# Patient Record
Sex: Female | Born: 1960
Health system: Southern US, Community
[De-identification: ages and names within clinical notes are randomized; demographics above are authoritative.]

## PROBLEM LIST (undated history)

## (undated) ENCOUNTER — Emergency Department (HOSPITAL_COMMUNITY): Admission: EM | Disposition: A | Discharge status: Home / Self Care | Payer: Self-pay

## (undated) DIAGNOSIS — K219 Gastro-esophageal reflux disease without esophagitis: Secondary | ICD-10-CM

## (undated) DIAGNOSIS — R7303 Prediabetes: Secondary | ICD-10-CM

## (undated) DIAGNOSIS — R87619 Unspecified abnormal cytological findings in specimens from cervix uteri: Secondary | ICD-10-CM

## (undated) DIAGNOSIS — R079 Chest pain, unspecified: Secondary | ICD-10-CM

## (undated) DIAGNOSIS — G5603 Carpal tunnel syndrome, bilateral upper limbs: Secondary | ICD-10-CM

## (undated) DIAGNOSIS — E785 Hyperlipidemia, unspecified: Secondary | ICD-10-CM

## (undated) DIAGNOSIS — D649 Anemia, unspecified: Secondary | ICD-10-CM

## (undated) DIAGNOSIS — E8881 Metabolic syndrome: Secondary | ICD-10-CM

## (undated) DIAGNOSIS — I10 Essential (primary) hypertension: Secondary | ICD-10-CM

## (undated) DIAGNOSIS — I509 Heart failure, unspecified: Secondary | ICD-10-CM

## (undated) DIAGNOSIS — E119 Type 2 diabetes mellitus without complications: Secondary | ICD-10-CM

## (undated) DIAGNOSIS — Z973 Presence of spectacles and contact lenses: Secondary | ICD-10-CM

## (undated) DIAGNOSIS — R55 Syncope and collapse: Secondary | ICD-10-CM

## (undated) DIAGNOSIS — Z6841 Body Mass Index (BMI) 40.0 and over, adult: Secondary | ICD-10-CM

## (undated) DIAGNOSIS — G43709 Chronic migraine without aura, not intractable, without status migrainosus: Secondary | ICD-10-CM

## (undated) DIAGNOSIS — M4316 Spondylolisthesis, lumbar region: Secondary | ICD-10-CM

## (undated) DIAGNOSIS — D573 Sickle-cell trait: Secondary | ICD-10-CM

## (undated) DIAGNOSIS — M1990 Unspecified osteoarthritis, unspecified site: Secondary | ICD-10-CM

## (undated) DIAGNOSIS — Z9889 Other specified postprocedural states: Secondary | ICD-10-CM

## (undated) DIAGNOSIS — I2510 Atherosclerotic heart disease of native coronary artery without angina pectoris: Secondary | ICD-10-CM

## (undated) DIAGNOSIS — E876 Hypokalemia: Secondary | ICD-10-CM

## (undated) DIAGNOSIS — IMO0002 Reserved for concepts with insufficient information to code with codable children: Secondary | ICD-10-CM

## (undated) HISTORY — PX: TUBAL LIGATION: SHX77

## (undated) HISTORY — DX: Unspecified abnormal cytological findings in specimens from cervix uteri: R87.619

## (undated) HISTORY — DX: Chronic migraine without aura, not intractable, without status migrainosus: G43.709

## (undated) HISTORY — PX: CHOLECYSTECTOMY: SHX55

## (undated) HISTORY — DX: Prediabetes: R73.03

## (undated) HISTORY — PX: BACK SURGERY: SHX140

## (undated) HISTORY — PX: SPINE SURGERY: SHX786

## (undated) HISTORY — DX: Reserved for concepts with insufficient information to code with codable children: IMO0002

## (undated) HISTORY — DX: Hyperlipidemia, unspecified: E78.5

---

## 1997-10-16 ENCOUNTER — Emergency Department (HOSPITAL_COMMUNITY): Admission: EM | Admit: 1997-10-16 | Discharge: 1997-10-16 | Payer: Self-pay | Admitting: Emergency Medicine

## 1998-09-24 ENCOUNTER — Emergency Department (HOSPITAL_COMMUNITY): Admission: EM | Admit: 1998-09-24 | Discharge: 1998-09-24 | Payer: Self-pay | Admitting: Emergency Medicine

## 1999-12-09 ENCOUNTER — Emergency Department (HOSPITAL_COMMUNITY): Admission: EM | Admit: 1999-12-09 | Discharge: 1999-12-10 | Payer: Self-pay

## 2000-06-10 ENCOUNTER — Encounter: Payer: Self-pay | Admitting: Emergency Medicine

## 2000-06-10 ENCOUNTER — Emergency Department (HOSPITAL_COMMUNITY): Admission: EM | Admit: 2000-06-10 | Discharge: 2000-06-10 | Payer: Self-pay | Admitting: Emergency Medicine

## 2001-02-15 ENCOUNTER — Emergency Department (HOSPITAL_COMMUNITY): Admission: EM | Admit: 2001-02-15 | Discharge: 2001-02-15 | Payer: Self-pay | Admitting: Emergency Medicine

## 2002-06-26 ENCOUNTER — Emergency Department (HOSPITAL_COMMUNITY): Admission: EM | Admit: 2002-06-26 | Discharge: 2002-06-26 | Payer: Self-pay | Admitting: Emergency Medicine

## 2002-08-25 ENCOUNTER — Emergency Department (HOSPITAL_COMMUNITY): Admission: EM | Admit: 2002-08-25 | Discharge: 2002-08-26 | Payer: Self-pay | Admitting: Emergency Medicine

## 2002-10-12 ENCOUNTER — Emergency Department (HOSPITAL_COMMUNITY): Admission: EM | Admit: 2002-10-12 | Discharge: 2002-10-12 | Payer: Self-pay

## 2003-02-16 ENCOUNTER — Encounter: Payer: Self-pay | Admitting: Emergency Medicine

## 2003-02-16 ENCOUNTER — Inpatient Hospital Stay (HOSPITAL_COMMUNITY): Admission: EM | Admit: 2003-02-16 | Discharge: 2003-02-18 | Payer: Self-pay | Admitting: Emergency Medicine

## 2003-02-18 ENCOUNTER — Encounter: Payer: Self-pay | Admitting: Cardiovascular Disease

## 2003-11-11 ENCOUNTER — Inpatient Hospital Stay (HOSPITAL_COMMUNITY): Admission: EM | Admit: 2003-11-11 | Discharge: 2003-11-13 | Payer: Self-pay | Admitting: Emergency Medicine

## 2003-11-14 ENCOUNTER — Emergency Department (HOSPITAL_COMMUNITY): Admission: EM | Admit: 2003-11-14 | Discharge: 2003-11-15 | Payer: Self-pay | Admitting: Emergency Medicine

## 2003-11-16 ENCOUNTER — Encounter: Admission: RE | Admit: 2003-11-16 | Discharge: 2003-11-16 | Payer: Self-pay | Admitting: Family Medicine

## 2004-01-09 ENCOUNTER — Emergency Department (HOSPITAL_COMMUNITY): Admission: EM | Admit: 2004-01-09 | Discharge: 2004-01-09 | Payer: Self-pay | Admitting: *Deleted

## 2004-01-12 ENCOUNTER — Emergency Department (HOSPITAL_COMMUNITY): Admission: EM | Admit: 2004-01-12 | Discharge: 2004-01-12 | Payer: Self-pay | Admitting: Family Medicine

## 2004-02-18 ENCOUNTER — Encounter: Admission: RE | Admit: 2004-02-18 | Discharge: 2004-02-18 | Payer: Self-pay | Admitting: Internal Medicine

## 2004-04-30 ENCOUNTER — Emergency Department (HOSPITAL_COMMUNITY): Admission: EM | Admit: 2004-04-30 | Discharge: 2004-04-30 | Payer: Self-pay | Admitting: Emergency Medicine

## 2005-01-24 ENCOUNTER — Emergency Department (HOSPITAL_COMMUNITY): Admission: EM | Admit: 2005-01-24 | Discharge: 2005-01-24 | Payer: Self-pay | Admitting: Emergency Medicine

## 2005-01-30 ENCOUNTER — Emergency Department (HOSPITAL_COMMUNITY): Admission: EM | Admit: 2005-01-30 | Discharge: 2005-01-30 | Payer: Self-pay | Admitting: Family Medicine

## 2006-02-09 ENCOUNTER — Emergency Department (HOSPITAL_COMMUNITY): Admission: EM | Admit: 2006-02-09 | Discharge: 2006-02-09 | Payer: Self-pay | Admitting: *Deleted

## 2006-02-09 ENCOUNTER — Encounter: Payer: Self-pay | Admitting: Vascular Surgery

## 2006-02-12 ENCOUNTER — Ambulatory Visit (HOSPITAL_COMMUNITY): Admission: RE | Admit: 2006-02-12 | Discharge: 2006-02-12 | Payer: Self-pay | Admitting: Emergency Medicine

## 2006-02-12 ENCOUNTER — Encounter: Payer: Self-pay | Admitting: Vascular Surgery

## 2006-06-20 ENCOUNTER — Emergency Department (HOSPITAL_COMMUNITY): Admission: EM | Admit: 2006-06-20 | Discharge: 2006-06-21 | Payer: Self-pay | Admitting: Emergency Medicine

## 2006-06-29 ENCOUNTER — Emergency Department (HOSPITAL_COMMUNITY): Admission: EM | Admit: 2006-06-29 | Discharge: 2006-06-29 | Payer: Self-pay | Admitting: Emergency Medicine

## 2006-12-24 ENCOUNTER — Emergency Department (HOSPITAL_COMMUNITY): Admission: EM | Admit: 2006-12-24 | Discharge: 2006-12-24 | Payer: Self-pay | Admitting: Emergency Medicine

## 2007-01-07 ENCOUNTER — Emergency Department (HOSPITAL_COMMUNITY): Admission: EM | Admit: 2007-01-07 | Discharge: 2007-01-07 | Payer: Self-pay | Admitting: Emergency Medicine

## 2007-04-12 ENCOUNTER — Emergency Department (HOSPITAL_COMMUNITY): Admission: EM | Admit: 2007-04-12 | Discharge: 2007-04-12 | Payer: Self-pay | Admitting: Emergency Medicine

## 2007-06-15 ENCOUNTER — Emergency Department (HOSPITAL_COMMUNITY): Admission: EM | Admit: 2007-06-15 | Discharge: 2007-06-15 | Payer: Self-pay | Admitting: Emergency Medicine

## 2007-08-08 ENCOUNTER — Emergency Department (HOSPITAL_COMMUNITY): Admission: EM | Admit: 2007-08-08 | Discharge: 2007-08-08 | Payer: Self-pay | Admitting: Emergency Medicine

## 2007-08-29 ENCOUNTER — Emergency Department (HOSPITAL_COMMUNITY): Admission: EM | Admit: 2007-08-29 | Discharge: 2007-08-29 | Payer: Self-pay | Admitting: Family Medicine

## 2007-08-30 ENCOUNTER — Emergency Department (HOSPITAL_COMMUNITY): Admission: EM | Admit: 2007-08-30 | Discharge: 2007-08-30 | Payer: Self-pay | Admitting: Emergency Medicine

## 2007-09-09 ENCOUNTER — Ambulatory Visit (HOSPITAL_COMMUNITY): Admission: RE | Admit: 2007-09-09 | Discharge: 2007-09-09 | Payer: Self-pay | Admitting: Pulmonary Disease

## 2007-10-17 ENCOUNTER — Inpatient Hospital Stay (HOSPITAL_COMMUNITY): Admission: RE | Admit: 2007-10-17 | Discharge: 2007-10-22 | Payer: Self-pay | Admitting: Neurosurgery

## 2009-03-02 ENCOUNTER — Emergency Department (HOSPITAL_COMMUNITY): Admission: EM | Admit: 2009-03-02 | Discharge: 2009-03-02 | Payer: Self-pay | Admitting: Emergency Medicine

## 2009-03-11 ENCOUNTER — Emergency Department (HOSPITAL_COMMUNITY): Admission: EM | Admit: 2009-03-11 | Discharge: 2009-03-11 | Payer: Self-pay | Admitting: Emergency Medicine

## 2009-03-25 ENCOUNTER — Emergency Department (HOSPITAL_COMMUNITY): Admission: EM | Admit: 2009-03-25 | Discharge: 2009-03-25 | Payer: Self-pay | Admitting: Emergency Medicine

## 2010-08-08 ENCOUNTER — Encounter: Payer: Self-pay | Admitting: Emergency Medicine

## 2010-10-21 LAB — RAPID STREP SCREEN (MED CTR MEBANE ONLY): Streptococcus, Group A Screen (Direct): NEGATIVE

## 2010-10-22 LAB — URINALYSIS, ROUTINE W REFLEX MICROSCOPIC
Bilirubin Urine: NEGATIVE
Glucose, UA: NEGATIVE mg/dL
Ketones, ur: NEGATIVE mg/dL
Leukocytes, UA: NEGATIVE
Nitrite: NEGATIVE
Protein, ur: NEGATIVE mg/dL
Specific Gravity, Urine: 1.025 (ref 1.005–1.030)
Urobilinogen, UA: 0.2 mg/dL (ref 0.0–1.0)
pH: 5.5 (ref 5.0–8.0)

## 2010-10-22 LAB — COMPREHENSIVE METABOLIC PANEL
ALT: 16 U/L (ref 0–35)
AST: 26 U/L (ref 0–37)
Albumin: 3.5 g/dL (ref 3.5–5.2)
Alkaline Phosphatase: 73 U/L (ref 39–117)
BUN: 9 mg/dL (ref 6–23)
CO2: 32 mEq/L (ref 19–32)
Calcium: 9.2 mg/dL (ref 8.4–10.5)
Chloride: 104 mEq/L (ref 96–112)
Creatinine, Ser: 0.96 mg/dL (ref 0.4–1.2)
GFR calc Af Amer: >60 mL/min (ref >60)
GFR calc non Af Amer: >60 mL/min (ref >60)
Glucose, Bld: 109 mg/dL — ABNORMAL HIGH (ref 70–99)
Potassium: 3.4 mEq/L — ABNORMAL LOW (ref 3.5–5.1)
Sodium: 139 mEq/L (ref 135–145)
Total Bilirubin: 0.4 mg/dL (ref 0.3–1.2)
Total Protein: 7.4 g/dL (ref 6.0–8.3)

## 2010-10-22 LAB — CBC
HCT: 31.9 % — ABNORMAL LOW (ref 36.0–46.0)
Hemoglobin: 10.9 g/dL — ABNORMAL LOW (ref 12.0–15.0)
MCHC: 34.1 g/dL (ref 30.0–36.0)
MCV: 76.8 fL — ABNORMAL LOW (ref 78.0–100.0)
Platelets: 304 10*3/uL (ref 150–400)
RBC: 4.15 MIL/uL (ref 3.87–5.11)
RDW: 15.2 % (ref 11.5–15.5)
WBC: 5.4 10*3/uL (ref 4.0–10.5)

## 2010-10-22 LAB — POCT CARDIAC MARKERS
CKMB, poc: 2 ng/mL (ref 1.0–8.0)
Myoglobin, poc: 180 ng/mL (ref 12–200)
Troponin i, poc: <0.05 ng/mL (ref 0.00–0.09)

## 2010-10-22 LAB — DIFFERENTIAL
Basophils Absolute: 0 10*3/uL (ref 0.0–0.1)
Basophils Relative: 0 % (ref 0–1)
Eosinophils Absolute: 0.2 10*3/uL (ref 0.0–0.7)
Eosinophils Relative: 3 % (ref 0–5)
Lymphocytes Relative: 20 % (ref 12–46)
Lymphs Abs: 1.1 10*3/uL (ref 0.7–4.0)
Monocytes Absolute: 0.6 10*3/uL (ref 0.1–1.0)
Monocytes Relative: 10 % (ref 3–12)
Neutro Abs: 3.5 10*3/uL (ref 1.7–7.7)
Neutrophils Relative %: 66 % (ref 43–77)

## 2010-10-22 LAB — URINE MICROSCOPIC-ADD ON

## 2010-10-22 LAB — BRAIN NATRIURETIC PEPTIDE: Pro B Natriuretic peptide (BNP): <30 pg/mL (ref 0.0–100.0)

## 2010-10-22 LAB — GLUCOSE, CAPILLARY: Glucose-Capillary: 117 mg/dL — ABNORMAL HIGH (ref 70–99)

## 2010-10-22 LAB — PREGNANCY, URINE: Preg Test, Ur: NEGATIVE

## 2010-11-29 NOTE — Op Note (Signed)
Hannah Huerta, Hannah Huerta                 ACCOUNT NO.:  [id number]   MEDICAL RECORD NO.:  [id number]          PATIENT TYPE:  INP   LOCATION:  3023                         FACILITY:  [facility]   PHYSICIAN:  Cristi Loron, M.D.DATE OF BIRTH:  1960-08-18   DATE OF PROCEDURE:  10/17/2007  DATE OF DISCHARGE:                               OPERATIVE REPORT   BRIEF HISTORY:  The patient is a 50 year old obese black female who has  suffered from back and leg pain.  She failed medical management and was  worked up with a lumbar MRI which demonstrated the patient had a grade 1  acquired spondylolisthesis at L4-5 with spinal stenosis.  I discussed  the various treatment options with the patient including surgery.  She  has weighed the risks, benefits, and alternatives of the surgery, so I  will proceed with a L4-5 decompression, instrumentation, and fusion.   PREOPERATIVE DIAGNOSES:  L4-5 grade 1 acquired spondylolisthesis, spinal  stenosis, degenerative disk disease, lumbar radiculopathy/myelopathy,  and lumbago.   POSTOPERATIVE DIAGNOSES:  L4-5 grade 1 acquired spondylolisthesis,  spinal stenosis, degenerative disk disease, lumbar  radiculopathy/myelopathy, and lumbago.   PROCEDURE:  L4 laminotomy and foraminotomy to decompress the bilateral  L4 and L5 nerve roots; L4-5 transforaminal lumbar interbody fusion with  local autograft bone and  Vitoss bone graft extender; insertion of L4-5  interbody prosthesis (Capstone PEEK cage); posterior non-segmental  instrumentation at L4-5 with Legacy titanium pedicle screws and rods; L4-  5 posterolateral arthrodesis with local morselized autograft bone and  Vitoss bone graft extender.   SURGEON:  Cristi Loron, M.D.   ASSISTANT:  Coletta Memos, M.D.   ANESTHESIA:  General endotracheal.   ESTIMATED BLOOD LOSS:  250 cc.   SPECIMENS:  None.   DRAINS:  None.   COMPLICATIONS:  None.   DESCRIPTION OF PROCEDURE:  The patient was brought to the  operating room  by the anesthesia team.  General endotracheal anesthesia was induced.  The patient was then turned to the prone position on the Wilson frame.  The lumbosacral region was then prepared with Betadine scrub and  Betadine solution.  Sterile drapes were applied.  I then injected the  area to be incised with Marcaine with epinephrine solution.  I used a  scalpel to make a linear midline incision over the L4-5 interspace.  We  used electrocautery to perform a bilateral subperiosteal dissection  exposing the spinous process and lamina of L3, L4, and L5.  We then  obtained intraoperative radiograph to confirm our location.  I then  inserted the  Versatrac retractor for exposure.   We began the decompression by performing bilateral L4  wide  laminotomies.  We widened these laminotomies using Kerrison punch and  removed the L4-5 ligamentum flavum.  Because of the amount of lateral  recess stenosis, because of the spondylolisthesis, the decompression  required was greater than what had been required to simply do an  interbody fusion.  We removed the medial aspect of L4-5 facet joint.  We  performed foraminotomies about the bilateral L4 and L5 nerve roots  completing  the decompression.   Having completed decompression, we now turned our attention to  arthrodesis.  We incised the L4-5 intervertebral disk on the right.  We  then performed a partial intervertebral  diskectomy using the  pituitary  forceps.  We prepared the vertebral endplates for fusion by using  curettes clearing the soft tissue from  the vertebral endplates.  We  then used a trial spacer to determine to use a 12 x 26-mm PEEK interbody  prosthesis, Capstone prosthesis.  We prefilled this prosthesis with a  combination of local autograft bone we obtained during decompression as  well as Vitoss bone graft extender.  We also used this substance  to  fill in the ventral L4-5 disk space as well.  We then inserted the   prosthesis into the disk space,  of course after retracting the neural  structures out  of harms way and then we turned the prosthesis laterally  using bone tamps completing transforaminal lumbar interbody fusion.  I  should mention, we also filled posterior prosthesis with  Vitoss and  local autograft bone.   We now turned our attention doing the instrumentation.  Under  fluoroscopic guidance, we cannulated bilateral L4 and L5 pedicles with  bone probes.  We tapped the pedicles with a 5.5-mm tap.  We probed  inside the tapped pedicles with a ball probe to rule out cortical  breeches.  We then inserted 6.5 x 45-mm pedicle screws bilaterally at L5  and 6.5 x 50 bilaterally at L4 under fluoroscopic guidance.  We then  palpated along the medial aspect of the L4 and L5 pedicles bilaterally  and noted there was no cortical breeches and the nerve roots were not  injured.  We then connected unilateral pedicle screws with lordotic rod  which we compressed to construct and then secured the rod in place using  the cams which we tightened appropriately.  This completed the  instrumentation.   We now turned our attention to posterolateral arthrodesis.  We used a  high-speed drill to decorticate the remainder of the L4-5  facets pars  region and transverse processes.  We laid a combination of local  autograft bone and  Vitoss bone graft extender over these decorticated  posterolateral structures completing the posterolateral arthrodesis.   We then obtained hemostasis using bipolar electrocautery.  Irrigated the  wound out with bacitracin solution.  We then removed the retractors, and  then reapproximated the patient's thoracolumbar fascia with interrupted  #1 Vicryl suture, subcutaneous tissue with interrupted 2-0 Vicryl  suture, and the skin with Steri-Strips and Benzoin.  The wound was then  coated with bacitracin ointment and sterile dressing was applied.  The  drapes were removed, and the  patient was subsequently returned to supine  position where she was extubated by the anesthesia team and transported  to postanesthesia care unit in stable condition.  All sponge,  instrument, and needle counts were correct at the end of this case.      Cristi Loron, M.D.  Electronically Signed     JDJ/MEDQ  D:  10/17/2007  T:  10/18/2007  Job:  [id number]
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
## 2010-12-02 NOTE — Discharge Summary (Signed)
Hannah Huerta, Hannah Huerta                 ACCOUNT NO.:  [id number]   MEDICAL RECORD NO.:  [id number]          PATIENT TYPE:  INP   LOCATION:  3023                         FACILITY:  [facility]   PHYSICIAN:  Cristi Loron, M.D.DATE OF BIRTH:  [birth year]   DATE OF ADMISSION:  10/17/2007  DATE OF DISCHARGE:  10/22/2007                               DISCHARGE SUMMARY   BRIEF HISTORY:  The patient is a 50 year old obese, black female who is  suffering from back and leg pain.  She has failed medical management.  Workup of the lumbar MRI which demonstrated the patient had a grade I  spondylolisthesis at L4-L5 of her spinal stenosis.  I discussed the  various treatment options with the patient including surgery.  She has  weighed the risks, benefits, and alternatives of surgery and decided to  pursue with an L4-l5 decompression, instrumentation, and fusion repair.  For further details of this admission, please refer to typed history and  physical.   HOSPITAL COURSE:  I met the patient in [facility] on October 17, 2007.  On the day of admission, I performed a L4-L5 decompression  instrumentation and fusion.  The surgery went well and for full details  of this operation, please refer to typed operative note.   POSTOPERATIVE COURSE:  The patient's postoperative course was remarkable  for a bout of vaginitis.  Flagyl was started, Foley was replaced.  The  patient's symptoms subsequently resolved.  By postoperative day #5,  October 22, 2007, the patient was afebrile, vital signs were stable.  She  was requested discharge to home.  She was therefore discharged on October 22, 2007.   DISCHARGE MEDICATIONS:  1. Percocet 10/325 #100, one p.o. q.4 h. p.r.n for pain.  2. Valium 5 mg, #50, one p.o. q.8 h. muscular spasms.  3. Cipro 750 mg #10 1 p.o. b.i.d.   HOSPITAL COURSE:  The patient was also found to have urinary tract  infection and was started on Cipro.   FINAL DIAGNOSES:  1. L4-L5 grade I  acquired spondylolisthesis.  2. Spinal stenosis.  3. Degenerative disk disease.  4. Lumbar radiculopathy.  5. Status post myelopathy.  6. Lumbago.   PROCEDURES PERFORMED:  1. L4 laminotomy and foraminotomy.  2. Decompressive bilateral L4-L5 nerve root; L4-L5 transforaminal      lumbar interbody fusion with local autograft      bone and Vitoss bone graft extender; insertion of L4-L5 interbody      prosthesis (capsule PEEK cage); posterior nonsegmental      instrumentation at L4-L5 with Legacy titanium pedicle screw rods;      L4-L5 posterolateral arthrodesis with local morcellized autograft      bone and Vitoss bone graft extender.      Cristi Loron, M.D.  Electronically Signed     JDJ/MEDQ  D:  11/14/2007  T:  11/15/2007  Job:  [id number]

## 2010-12-02 NOTE — Consult Note (Signed)
NAME:  Hannah Huerta, DEMEYER                           ACCOUNT NO.:  [id number]   MEDICAL RECORD NO.:  [id number]                   PATIENT TYPE:  INP   LOCATION:  3715                                 FACILITY:  [facility]   PHYSICIAN:  John C. Madilyn Fireman, M.D.                 DATE OF BIRTH:  1960-10-17   DATE OF CONSULTATION:  02/17/2003  DATE OF DISCHARGE:                                   CONSULTATION   REASON FOR CONSULTATION:  Abdominal  and chest pain and nausea and vomiting.   HISTORY OF PRESENT ILLNESS:  The patient is a 50 year old obese black female  who was admitted yesterday with chest and xiphoid level pain described as a  gnawing associated with nausea and vomiting. This began about 2 to 3 days  ago but intermittently has gotten worse. She states she has had similar  bouts with 3 emergency room visits over the last year with no specific  diagnosis forthcoming, no prior admissions. She had some brief diarrhea  but  this has resolved. She ruled out for coronary artery disease and the pain  was somewhat improved this morning. She is being considered for discharge  and was complaining of more nausea and pain today, and  I was asked to see  her regarding any possible GI etiology of  it.   The patient has had a cholecystectomy in 1992. She has no known  peptic  ulcer disease. She does not describe typical heart  burn. She has been given  GI cocktails in the past in the emergency room  but that does not generally  seem to help. She denies any dysphagia. She denies any black stools or  rectal bleeding.   PAST MEDICAL HISTORY:  1. Hypertension.  2. Obesity.   MEDICATIONS:  None. She uses aspirin occasionally for  pain but averages  less than once a week.   ALLERGIES:  No known allergies.   FAMILY HISTORY:  Her father died of  an MI. Her mother also died of an MI.  No family history of GI malignancy or peptic ulcer disease.   SOCIAL HISTORY:  The patient is married. She has 3 children.  She works at  [employer]. She denies alcohol  or tobacco use.   PHYSICAL EXAMINATION:  GENERAL:  A morbidly obese black female in no acute  distress. Trying to eat lunch.  HEART:  Regular rate and rhythm without murmur. Examination limited due to  extremely large breasts.  ABDOMEN:  Soft, mildly diffusely tender in the epigastrium. No  hepatosplenomegaly, masses or guarding.   LABORATORY DATA:  Hemoglobin 11.2, WBCs 6.1. Amylase 145, lipase 18, liver  function tests normal.   IMPRESSION:  Epigastric  and chest pain, etiology unclear. I doubt biliary  etiology this  far out after cholecystectomy, but cannot  be completely  sure. Rule out peptic ulcer disease or  symptomatic hiatal hernia.  PLAN:  Since she has eaten today will hold  off on the upper endoscopy. Will  order an upper GI series and also H. pylori antibody. Will try her on  Protonix  empirically. Further  recommendations to follow.                                               John C. Madilyn Fireman, M.D.    [facility]  D:  02/17/2003  T:  02/17/2003  Job:  [id number]   cc:   Nicki Guadalajara, M.D.  [phone] [address], [address]  [place], [place] [address]  Fax: [phone]

## 2010-12-02 NOTE — Discharge Summary (Signed)
NAME:  Hannah Huerta, Hannah Huerta                           ACCOUNT NO.:  [id number]   MEDICAL RECORD NO.:  [id number]                   PATIENT TYPE:  INP   LOCATION:  3715                                 FACILITY:  [facility]   PHYSICIAN:  Nicki Guadalajara, M.D.                  DATE OF BIRTH:  09-07-1960   DATE OF ADMISSION:  02/16/2003  DATE OF DISCHARGE:  02/18/2003                                 DISCHARGE SUMMARY   DISCHARGE DIAGNOSES:  1. Chest pain, atypical; resolved.  2. Morbid obesity.  3. Status post gastrointestinal evaluation - suspected gastroesophageal     reflux/hiatal hernia.  4. Positive premature family history of coronary artery disease.  5. Hypertension.   HISTORY OF PRESENT ILLNESS AND HOSPITAL COURSE:  This is a 50 year old  African-American female who had never had an episode of chest pain before  and presented to the emergency room at this hospital with complaints of  chest pain; also with complaints of nausea and vomiting, but there was no  diarrhea, no shortness of breath, no diaphoresis.   The patient said that she ate breakfast at 7:30 the morning of this  admission and started vomiting.  Then vomited again three times at work and  the same happened three times after she came back from work home.   PAST MEDICAL HISTORY:  The patient's past medical history is significant  for:  1. Hypertension, but it was not treated.  She is not any medication.  2. Obesity.   ALLERGIES:  No known drug allergies.   FAMILY HISTORY:  The patient's father had a heart attack at age 73 and  mother had a heart attack at age 82.  Grandparents on both sides with CVA.   The patient was admitted on rule-out MI protocol.  Also, cardiac enzymes  revealed negative values.  Her EKG showed a normal sinus rhythm.   A gastrointestinal abnormality was suspected and we requested a GI consult.   The patient was seen in consult by Dr. Madilyn Fireman on February 17, 2003, and was  diagnosed with  atypical chest  pain of unclear etiology.  The patient is  status post a cholecystectomy and it was doubtful the pain could be of  biliary etiology, but others could not be completely ruled out.  The plan  was to rule out peptic ulcer disease and symptomatic hiatal hernia, but,  unfortunately the patient could not undergo upper endoscopy because she had  her breakfast and lunch on the day of consultation.  She was scheduled for  an upper GI series and H. pylori antibody and in fact, she was started on  Protonix.   The day of discharge in the morning the patient underwent upper GI series  and the preliminary report showed hiatal hernia and gastroesophageal reflux  disease, there were no structures nor ulcers on preliminary assessment.   The patient was considered to  be stable for a discharge home from a  cardiovascular standpoint and also the nurse on the floor talked to Dr.  Madilyn Fireman and he considered the patient stable for a discharge home on proton  pump inhibitors and his office will follow up on pending H. pylori results  and follow up with the patient.   HOSPITAL LABS:  BMP on the day of discharge showed a sodium of 138,  potassium 3.9, BUN 5, creatinine 0.8, glucose 105.  TSH was within normal  limits.  Amylase and Lipase were within normal limits.  A CBC was normal.  Total cholesterol 174., triglycerides 67, HDL 61, LDL 100.   CONDITION ON DISCHARGE:  The patient was discharged home in stable  condition, improved.   DISCHARGE MEDS:  Protonix 40 mg p.o. daily.   DISCHARGE DIET:  As tolerated.  Try to avoid spicy foods and salty food.   DISCHARGE ACTIVITIES:  As tolerated.   DISCHARGE FOLLOWUP:  Dr. Madilyn Fireman office will contact the patient to set up an  appointment for followup of her H. pylori result when it is available.      Raymon Mutton, P.A.                    Nicki Guadalajara, M.D.    [facility]  D:  02/18/2003  T:  02/19/2003  Job:  [id number]   cc:   Everardo All. Madilyn Fireman, M.D.  [address], [address]  [place]  [place] [address]  Fax: [phone]   [facility]
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
## 2011-04-06 LAB — CBC
HCT: 31.7 — ABNORMAL LOW
Hemoglobin: 10.6 — ABNORMAL LOW
MCHC: 33.5
MCV: 75.9 — ABNORMAL LOW
Platelets: 324
RBC: 4.18
RDW: 15.1
WBC: 4.2

## 2011-04-06 LAB — BASIC METABOLIC PANEL
BUN: 12
CO2: 29
Calcium: 8.9
Chloride: 100
Creatinine, Ser: 0.92
GFR calc Af Amer: >60
GFR calc non Af Amer: >60
Glucose, Bld: 97
Potassium: 4.1
Sodium: 136

## 2011-04-06 LAB — DIFFERENTIAL
Basophils Absolute: 0
Basophils Relative: 0
Eosinophils Absolute: 0.1
Eosinophils Relative: 3
Lymphocytes Relative: 18
Lymphs Abs: 0.7
Monocytes Absolute: 0.5
Monocytes Relative: 12
Neutro Abs: 2.8
Neutrophils Relative %: 67

## 2011-04-06 LAB — PROTIME-INR
INR: 0.9
Prothrombin Time: 12.9

## 2011-04-06 LAB — B-NATRIURETIC PEPTIDE (CONVERTED LAB): Pro B Natriuretic peptide (BNP): 31.4

## 2011-04-07 LAB — D-DIMER, QUANTITATIVE (NOT AT ARMC): D-Dimer, Quant: 0.53 — ABNORMAL HIGH

## 2011-04-10 LAB — CBC
HCT: 32.4 — ABNORMAL LOW
Hemoglobin: 10.7 — ABNORMAL LOW
MCHC: 33.1
MCV: 76.4 — ABNORMAL LOW
Platelets: 337
RBC: 4.24
RDW: 15.6 — ABNORMAL HIGH
WBC: 4.9

## 2011-04-10 LAB — ABO/RH: ABO/RH(D): A POS

## 2011-04-10 LAB — TYPE AND SCREEN
ABO/RH(D): A POS
Antibody Screen: NEGATIVE

## 2011-04-11 LAB — URINALYSIS, ROUTINE W REFLEX MICROSCOPIC
Glucose, UA: NEGATIVE
Hgb urine dipstick: NEGATIVE
Ketones, ur: NEGATIVE
Nitrite: POSITIVE — AB
Protein, ur: 30 — AB
Specific Gravity, Urine: 1.018
Urobilinogen, UA: 1
pH: 5.5

## 2011-04-11 LAB — URINE CULTURE: Colony Count: >=100000

## 2011-04-11 LAB — URINE MICROSCOPIC-ADD ON

## 2011-04-11 LAB — CBC
HCT: 29.1 — ABNORMAL LOW
Hemoglobin: 9.7 — ABNORMAL LOW
MCHC: 33.4
MCV: 76.8 — ABNORMAL LOW
Platelets: 284
RBC: 3.78 — ABNORMAL LOW
RDW: 15.5
WBC: 9.4

## 2011-04-11 LAB — BASIC METABOLIC PANEL
BUN: 7
CO2: 30
Calcium: 8.7
Chloride: 102
Creatinine, Ser: 0.82
GFR calc Af Amer: >60
GFR calc non Af Amer: >60
Glucose, Bld: 117 — ABNORMAL HIGH
Potassium: 3.9
Sodium: 137

## 2011-04-25 LAB — CBC
HCT: 31.2 — ABNORMAL LOW
Hemoglobin: 10.5 — ABNORMAL LOW
MCHC: 33.7
MCV: 76.1 — ABNORMAL LOW
Platelets: 311
RBC: 4.1
RDW: 14.8
WBC: 3.3 — ABNORMAL LOW

## 2011-04-25 LAB — DIFFERENTIAL
Basophils Absolute: 0
Basophils Relative: 1
Eosinophils Absolute: 0.2
Eosinophils Relative: 5
Lymphocytes Relative: 24
Lymphs Abs: 0.8
Monocytes Absolute: 0.5
Monocytes Relative: 15 — ABNORMAL HIGH
Neutro Abs: 1.8
Neutrophils Relative %: 56

## 2011-04-25 LAB — BASIC METABOLIC PANEL
BUN: 11
CO2: 29
Calcium: 8.7
Chloride: 106
Creatinine, Ser: 0.92
GFR calc Af Amer: >60
GFR calc non Af Amer: >60
Glucose, Bld: 107 — ABNORMAL HIGH
Potassium: 3.7
Sodium: 138

## 2011-05-03 LAB — I-STAT 8, (EC8 V) (CONVERTED LAB)
BUN: 7
Bicarbonate: 23.9
Chloride: 104
Glucose, Bld: 107 — ABNORMAL HIGH
HCT: 36
Hemoglobin: 12.2
Operator id: 285841
Potassium: 3.4 — ABNORMAL LOW
Sodium: 139
TCO2: 25
pCO2, Ven: 35.2 — ABNORMAL LOW
pH, Ven: 7.441 — ABNORMAL HIGH

## 2011-05-03 LAB — POCT I-STAT CREATININE
Creatinine, Ser: 0.9
Operator id: 285841

## 2011-05-03 LAB — D-DIMER, QUANTITATIVE (NOT AT ARMC): D-Dimer, Quant: 0.4

## 2011-05-04 LAB — RAPID STREP SCREEN (MED CTR MEBANE ONLY): Streptococcus, Group A Screen (Direct): NEGATIVE

## 2011-09-13 ENCOUNTER — Encounter (HOSPITAL_COMMUNITY): Payer: Self-pay | Admitting: *Deleted

## 2011-09-13 ENCOUNTER — Emergency Department (INDEPENDENT_AMBULATORY_CARE_PROVIDER_SITE_OTHER): Payer: BC Managed Care – PPO

## 2011-09-13 ENCOUNTER — Emergency Department (INDEPENDENT_AMBULATORY_CARE_PROVIDER_SITE_OTHER)
Admission: EM | Admit: 2011-09-13 | Discharge: 2011-09-13 | Disposition: A | Discharge status: Home / Self Care | Payer: BC Managed Care – PPO | Source: Home / Self Care | Attending: Family Medicine | Admitting: Family Medicine

## 2011-09-13 DIAGNOSIS — J069 Acute upper respiratory infection, unspecified: Secondary | ICD-10-CM

## 2011-09-13 MED ORDER — GI COCKTAIL ~~LOC~~
30.0000 mL | Freq: Once | ORAL | Status: AC
  Administered 2011-09-13: 30 mL via ORAL

## 2011-09-13 MED ORDER — GI COCKTAIL ~~LOC~~
ORAL | Status: AC
  Filled 2011-09-13: qty 30

## 2011-09-13 MED ORDER — HYDROCOD POLST-CHLORPHEN POLST 10-8 MG/5ML PO LQCR: 5.0000 mL | Freq: Two times a day (BID) | ORAL | Status: DC

## 2011-09-13 MED ORDER — AZITHROMYCIN 250 MG PO TABS: ORAL_TABLET | ORAL | Status: AC

## 2011-09-13 NOTE — Discharge Instructions (Signed)
Drink plenty of fluids as discussed, use medicine as prescribed, and mucinex for cough. Return or see your doctor if further problems °

## 2011-09-13 NOTE — ED Notes (Signed)
Pt c/o shortness of breath and dry cough for a week.

## 2011-09-13 NOTE — ED Provider Notes (Signed)
History     CSN: [id number]  Arrival date & time 09/13/11  1748   First MD Initiated Contact with Patient 09/13/11 1808      Chief Complaint  Patient presents with  . Shortness of Breath  . Cough    (Consider location/radiation/quality/duration/timing/severity/associated sxs/prior treatment) Patient is a 51 y.o. female presenting with cough. The history is provided by the patient.  Cough This is a new problem. The current episode started more than 1 week ago. The problem occurs constantly. The problem has been gradually worsening. The cough is non-productive. There has been no fever. Associated symptoms include chest pain and shortness of breath. Pertinent negatives include no chills, no rhinorrhea, no myalgias and no wheezing.    History reviewed. No pertinent past medical history.  History reviewed. No pertinent past surgical history.  No family history on file.  History  Substance Use Topics  . Smoking status: Never Smoker   . Smokeless tobacco: Not on file  . Alcohol Use: No    OB History    Grav Para Term Preterm Abortions TAB SAB Ect Mult Living                  Review of Systems  Constitutional: Positive for fatigue. Negative for fever and chills.  HENT: Negative for rhinorrhea.   Respiratory: Positive for cough and shortness of breath. Negative for wheezing.   Cardiovascular: Positive for chest pain and leg swelling. Negative for palpitations.  Genitourinary: Negative.   Musculoskeletal: Negative for myalgias.    Allergies  Review of patient's allergies indicates no known allergies.  Home Medications   Current Outpatient Rx  Name Route Sig Dispense Refill  . AZITHROMYCIN 250 MG PO TABS  Take as directed on pack 6 each 0  . HYDROCOD POLST-CPM POLST ER 10-8 MG/5ML PO LQCR Oral Take 5 mLs by mouth every 12 (twelve) hours. 115 mL 0    BP 161/87  Pulse 88  Temp(Src) 99.4 F (37.4 C) (Oral)  Resp 18  SpO2 97%  LMP 09/10/2011  Physical Exam    Nursing note and vitals reviewed. Constitutional: She is oriented to person, place, and time. She appears well-developed and well-nourished.  HENT:  Head: Normocephalic.  Right Ear: External ear normal.  Left Ear: External ear normal.  Nose: Nose normal.  Mouth/Throat: Oropharynx is clear and moist.  Eyes: Pupils are equal, round, and reactive to light.  Neck: Normal range of motion. Neck supple.  Cardiovascular: Normal rate, regular rhythm, normal heart sounds and intact distal pulses.   Pulmonary/Chest: Effort normal and breath sounds normal. No respiratory distress. She has no wheezes. She has no rales. She exhibits no tenderness.  Abdominal: Soft. Bowel sounds are normal. There is no tenderness.  Musculoskeletal: She exhibits no edema.  Lymphadenopathy:    She has no cervical adenopathy.  Neurological: She is alert and oriented to person, place, and time.  Skin: Skin is warm and dry.  Psychiatric: She has a normal mood and affect.    ED Course  Procedures (including critical care time)  Labs Reviewed - No data to display Dg Chest 2 View  09/13/2011  *RADIOLOGY REPORT*  Clinical Data: 51 year old female with cough, dizziness, shortness of breath, left chest pain.  CHEST - 2 VIEW  Comparison: 03/25/2009 and earlier.  Findings: Stable and normal lung volumes.  Cardiac size at the upper limits of normal. Other mediastinal contours are within normal limits.  Visualized tracheal air column is within normal limits.  No pneumothorax,  pulmonary edema, pleural effusion or confluent pulmonary opacity. No acute osseous abnormality identified.  IMPRESSION: No acute cardiopulmonary abnormality.  Original Report Authenticated By: Harley Hallmark, M.D.     1. URI (upper respiratory infection)       MDM  X-rays reviewed and report per radiologist.         Barkley Bruns, MD 09/13/11 [phone]

## 2011-11-19 ENCOUNTER — Emergency Department (HOSPITAL_COMMUNITY): Payer: BC Managed Care – PPO

## 2011-11-19 ENCOUNTER — Encounter (HOSPITAL_COMMUNITY): Payer: Self-pay | Admitting: *Deleted

## 2011-11-19 ENCOUNTER — Emergency Department (HOSPITAL_COMMUNITY)
Admission: EM | Admit: 2011-11-19 | Discharge: 2011-11-19 | Disposition: A | Discharge status: Home / Self Care | Payer: BC Managed Care – PPO | Attending: Emergency Medicine | Admitting: Emergency Medicine

## 2011-11-19 DIAGNOSIS — M25539 Pain in unspecified wrist: Secondary | ICD-10-CM | POA: Insufficient documentation

## 2011-11-19 DIAGNOSIS — M79609 Pain in unspecified limb: Secondary | ICD-10-CM | POA: Insufficient documentation

## 2011-11-19 DIAGNOSIS — M109 Gout, unspecified: Secondary | ICD-10-CM | POA: Insufficient documentation

## 2011-11-19 MED ORDER — KETOROLAC TROMETHAMINE 60 MG/2ML IM SOLN
60.0000 mg | Freq: Once | INTRAMUSCULAR | Status: AC
  Administered 2011-11-19: 60 mg via INTRAMUSCULAR
  Filled 2011-11-19: qty 2

## 2011-11-19 MED ORDER — HYDROCODONE-ACETAMINOPHEN 5-325 MG PO TABS: 1.0000 | ORAL_TABLET | Freq: Four times a day (QID) | ORAL | Status: AC | PRN

## 2011-11-19 MED ORDER — DEXAMETHASONE SODIUM PHOSPHATE 10 MG/ML IJ SOLN
10.0000 mg | Freq: Once | INTRAMUSCULAR | Status: AC
  Administered 2011-11-19: 10 mg via INTRAMUSCULAR
  Filled 2011-11-19: qty 1

## 2011-11-19 MED ORDER — DICLOFENAC SODIUM 75 MG PO TBEC: 75.0000 mg | DELAYED_RELEASE_TABLET | Freq: Two times a day (BID) | ORAL | Status: DC

## 2011-11-19 NOTE — ED Provider Notes (Signed)
Medical screening examination/treatment/procedure(s) were performed by non-physician practitioner and as supervising physician I was immediately available for consultation/collaboration.  Khylen Riolo R. Bexlee Bergdoll, MD 11/19/11 2322 

## 2011-11-19 NOTE — ED Notes (Signed)
Pt is here with left wrist pain and swelling that began this am.  Pt has known carpal tunnel in this wrist.

## 2011-11-19 NOTE — ED Provider Notes (Signed)
History     CSN: [id number]  Arrival date & time 11/19/11  4098   First MD Initiated Contact with Patient 11/19/11 2011     8:48 PM HPI Patient reports left wrist pain that she woke up with this morning. Reports pain feels different from her typical carpal tunnel. States her wrist feels swollen. Denies known injury. Denies history of gout. Patient has mild erythema of ulnar-side of wrist.  Patient is a 51 y.o. female presenting with wrist pain. The history is provided by the patient.  Wrist Pain This is a new problem. The current episode started today. The problem occurs constantly. The problem has been unchanged. Pertinent negatives include no abdominal pain, nausea, neck pain, numbness, vomiting or weakness. The symptoms are aggravated by bending (Palpation).    Past Medical History  Diagnosis Date  . Obesity     Past Surgical History  Procedure Date  . Cholecystectomy   . Tubal ligation     No family history on file.  History  Substance Use Topics  . Smoking status: Never Smoker   . Smokeless tobacco: Not on file  . Alcohol Use: No    OB History    Grav Para Term Preterm Abortions TAB SAB Ect Mult Living                  Review of Systems  HENT: Negative for neck pain.   Gastrointestinal: Negative for nausea, vomiting and abdominal pain.  Musculoskeletal: Negative for back pain.       Wrist pain  Skin: Negative for wound.  Neurological: Negative for weakness and numbness.  All other systems reviewed and are negative.    Allergies  Review of patient's allergies indicates no known allergies.  Home Medications  No current outpatient prescriptions on file.  BP 153/90  Pulse 69  Temp(Src) 98.7 F (37.1 C) (Oral)  Resp 20  SpO2 100%  LMP 11/05/2011  Physical Exam  Vitals reviewed. Constitutional: She is oriented to person, place, and time. Vital signs are normal. She appears well-developed and well-nourished. No distress.  HENT:  Head: Normocephalic  and atraumatic.  Eyes: Pupils are equal, round, and reactive to light.  Neck: Neck supple.  Pulmonary/Chest: Effort normal.  Musculoskeletal:       Left wrist: She exhibits tenderness and swelling. She exhibits normal range of motion.       Arms: Neurological: She is alert and oriented to person, place, and time.  Skin: Skin is warm and dry. No rash noted. No erythema. No pallor.  Psychiatric: She has a normal mood and affect. Her behavior is normal.    ED Course  Procedures  Results for orders placed during the hospital encounter of 03/25/09  RAPID STREP SCREEN      Component Value Range   Streptococcus, Group A Screen (Direct) NEGATIVE  NEGATIVE    Dg Wrist Complete Left  11/19/2011  *RADIOLOGY REPORT*  Clinical Data: Left wrist swelling and pain.  LEFT WRIST - COMPLETE 3+ VIEW  Comparison: None.  Findings: There is no evidence of fracture or dislocation.  The carpal rows are intact, and demonstrate normal alignment.  The joint spaces are preserved.  No significant soft tissue abnormalities are seen.  IMPRESSION: No evidence of fracture or dislocation.  Original Report Authenticated By: Tonia Ghent, M.D.     MDM    Patient's wrist is warm to palpation and mildly erythematous. Suspect gout of left wrist. Will discharge patient with indomethacin and Vicodin. Advised elevation ice  and rest. 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 Advised patient to followup with primary care physician if symptoms continue. Patient voices understanding and is ready for discharge     Thomasene Lot, Cordelia Poche 11/19/11 2203

## 2012-03-05 ENCOUNTER — Inpatient Hospital Stay (HOSPITAL_COMMUNITY)
Admission: EM | Admit: 2012-03-05 | Discharge: 2012-03-09 | DRG: 125 | Disposition: A | Discharge status: Home / Self Care | Payer: BC Managed Care – PPO | Attending: Cardiology | Admitting: Cardiology

## 2012-03-05 ENCOUNTER — Emergency Department (HOSPITAL_COMMUNITY): Payer: BC Managed Care – PPO

## 2012-03-05 ENCOUNTER — Encounter (HOSPITAL_COMMUNITY): Payer: Self-pay | Admitting: *Deleted

## 2012-03-05 DIAGNOSIS — E785 Hyperlipidemia, unspecified: Secondary | ICD-10-CM

## 2012-03-05 DIAGNOSIS — D509 Iron deficiency anemia, unspecified: Secondary | ICD-10-CM | POA: Diagnosis present

## 2012-03-05 DIAGNOSIS — Z7982 Long term (current) use of aspirin: Secondary | ICD-10-CM

## 2012-03-05 DIAGNOSIS — Z79899 Other long term (current) drug therapy: Secondary | ICD-10-CM

## 2012-03-05 DIAGNOSIS — R072 Precordial pain: Secondary | ICD-10-CM | POA: Diagnosis present

## 2012-03-05 DIAGNOSIS — R42 Dizziness and giddiness: Secondary | ICD-10-CM | POA: Diagnosis present

## 2012-03-05 DIAGNOSIS — K219 Gastro-esophageal reflux disease without esophagitis: Secondary | ICD-10-CM | POA: Diagnosis present

## 2012-03-05 DIAGNOSIS — Z6841 Body Mass Index (BMI) 40.0 and over, adult: Secondary | ICD-10-CM

## 2012-03-05 DIAGNOSIS — I10 Essential (primary) hypertension: Secondary | ICD-10-CM

## 2012-03-05 DIAGNOSIS — I2510 Atherosclerotic heart disease of native coronary artery without angina pectoris: Principal | ICD-10-CM | POA: Diagnosis present

## 2012-03-05 HISTORY — DX: Chest pain, unspecified: R07.9

## 2012-03-05 HISTORY — DX: Sickle-cell trait: D57.3

## 2012-03-05 HISTORY — DX: Anemia, unspecified: D64.9

## 2012-03-05 HISTORY — DX: Body Mass Index (BMI) 40.0 and over, adult: Z684

## 2012-03-05 HISTORY — DX: Gastro-esophageal reflux disease without esophagitis: K21.9

## 2012-03-05 HISTORY — DX: Essential (primary) hypertension: I10

## 2012-03-05 HISTORY — DX: Morbid (severe) obesity due to excess calories: E66.01

## 2012-03-05 LAB — CBC WITH DIFFERENTIAL/PLATELET
Basophils Absolute: 0 10*3/uL (ref 0.0–0.1)
Basophils Relative: 0 % (ref 0–1)
Eosinophils Absolute: 0.2 10*3/uL (ref 0.0–0.7)
Eosinophils Relative: 4 % (ref 0–5)
HCT: 33.9 % — ABNORMAL LOW (ref 36.0–46.0)
Hemoglobin: 11 g/dL — ABNORMAL LOW (ref 12.0–15.0)
Lymphocytes Relative: 27 % (ref 12–46)
Lymphs Abs: 1.5 10*3/uL (ref 0.7–4.0)
MCH: 24.5 pg — ABNORMAL LOW (ref 26.0–34.0)
MCHC: 32.4 g/dL (ref 30.0–36.0)
MCV: 75.5 fL — ABNORMAL LOW (ref 78.0–100.0)
Monocytes Absolute: 0.9 10*3/uL (ref 0.1–1.0)
Monocytes Relative: 16 % — ABNORMAL HIGH (ref 3–12)
Neutro Abs: 3 10*3/uL (ref 1.7–7.7)
Neutrophils Relative %: 54 % (ref 43–77)
Platelets: 340 10*3/uL (ref 150–400)
RBC: 4.49 MIL/uL (ref 3.87–5.11)
RDW: 15 % (ref 11.5–15.5)
WBC: 5.7 10*3/uL (ref 4.0–10.5)

## 2012-03-05 LAB — PRO B NATRIURETIC PEPTIDE: Pro B Natriuretic peptide (BNP): 20.7 pg/mL (ref 0–125)

## 2012-03-05 LAB — BASIC METABOLIC PANEL
BUN: 10 mg/dL (ref 6–23)
CO2: 32 mEq/L (ref 19–32)
Calcium: 9.6 mg/dL (ref 8.4–10.5)
Chloride: 104 mEq/L (ref 96–112)
Creatinine, Ser: 0.8 mg/dL (ref 0.50–1.10)
GFR calc Af Amer: >90 mL/min (ref >90)
GFR calc non Af Amer: 85 mL/min — ABNORMAL LOW (ref >90)
Glucose, Bld: 89 mg/dL (ref 70–99)
Potassium: 3.9 mEq/L (ref 3.5–5.1)
Sodium: 144 mEq/L (ref 135–145)

## 2012-03-05 LAB — POCT I-STAT TROPONIN I: Troponin i, poc: 0.01 ng/mL (ref 0.00–0.08)

## 2012-03-05 MED ORDER — OXYCODONE-ACETAMINOPHEN 5-325 MG PO TABS
1.0000 | ORAL_TABLET | Freq: Once | ORAL | Status: AC
  Administered 2012-03-05: 1 via ORAL
  Filled 2012-03-05: qty 1

## 2012-03-05 NOTE — ED Notes (Signed)
Pt has had intermittent CP and dizziness for the past 3 days.  Today around 10 am had episode at work of nausea, dizziness and CP that did not go away.  Denies SOB, vomiting.

## 2012-03-05 NOTE — ED Provider Notes (Signed)
History     CSN: [id number]  Arrival date & time 03/05/12  1943   First MD Initiated Contact with Patient 03/05/12 2058      Chief Complaint  Patient presents with  . Chest Pain    (Consider location/radiation/quality/duration/timing/severity/associated sxs/prior treatment) HPI Comments: 51 y/o female presents with chest pain and tightness, sob, and fatigue gradually increasing over the past few weeks. Admits to associated non productive cough. SOB worse on exertion. Chest pain is intermittent worse with exertion. Today at work had episode of dizziness while going to the bathroom that lasted about 10 minutes. When she went home later today she had another 10 minute episode of dizziness while sitting on the couch. Admits to nausea and leg swelling. Has hx of "borderline HTN" which she does not take medication for. Denies lightheadedness, syncope, fever, chills, visual changes, tinnitus, vomiting. Both mom and dad have hx of heart attacks, dads at age 44. Patient is a non smoker.  Patient is a 51 y.o. female presenting with chest pain. The history is provided by the patient and the spouse.  Chest Pain Primary symptoms include fatigue, shortness of breath, cough, nausea and dizziness. Pertinent negatives for primary symptoms include no fever and no vomiting.  Dizziness also occurs with nausea. Dizziness does not occur with tinnitus or vomiting.      Past Medical History  Diagnosis Date  . Obesity   . Hypertension     Past Surgical History  Procedure Date  . Cholecystectomy   . Tubal ligation   . Spine surgery     fusion    History reviewed. No pertinent family history.  History  Substance Use Topics  . Smoking status: Never Smoker   . Smokeless tobacco: Not on file  . Alcohol Use: No    OB History    Grav Para Term Preterm Abortions TAB SAB Ect Mult Living                  Review of Systems  Constitutional: Positive for fatigue. Negative for fever and chills.    HENT: Negative for tinnitus.   Eyes: Negative for visual disturbance.  Respiratory: Positive for cough and shortness of breath.   Cardiovascular: Positive for chest pain and leg swelling.  Gastrointestinal: Positive for nausea. Negative for vomiting.  Neurological: Positive for dizziness. Negative for syncope and light-headedness.    Allergies  Review of patient's allergies indicates no known allergies.  Home Medications   Current Outpatient Rx  Name Route Sig Dispense Refill  . DICLOFENAC SODIUM 75 MG PO TBEC Oral Take 1 tablet (75 mg total) by mouth 2 (two) times daily. 30 tablet 0    BP 137/72  Pulse 63  Temp 98 F (36.7 C) (Oral)  Resp 21  SpO2 98%  Physical Exam  Nursing note and vitals reviewed. Constitutional: She is oriented to person, place, and time. She appears well-developed and well-nourished. No distress.       obese  HENT:  Head: Normocephalic and atraumatic.  Mouth/Throat: Oropharynx is clear and moist.  Eyes: Conjunctivae are normal.  Neck: Neck supple. No JVD present.  Cardiovascular: Normal rate, regular rhythm and intact distal pulses.  Exam reveals distant heart sounds.        +2 pitting edema lower extremities bilaterally  Pulmonary/Chest: Effort normal and breath sounds normal.  Abdominal: Soft. Normal appearance and bowel sounds are normal. There is no tenderness.  Neurological: She is alert and oriented to person, place, and time.  Skin:  Skin is warm and dry. She is not diaphoretic.  Psychiatric: She has a normal mood and affect. Her speech is normal and behavior is normal.    ED Course  Procedures (including critical care time)   Labs Reviewed  POCT I-STAT TROPONIN I  CBC  BASIC METABOLIC PANEL  PRO B NATRIURETIC PEPTIDE   Date: 03/05/2012  Rate: 71  Rhythm: normal sinus rhythm  QRS Axis: left  Intervals: normal  ST/T Wave abnormalities: normal  Conduction Disutrbances:none  Narrative Interpretation: no stemi  Old EKG Reviewed:  unchanged   Results for orders placed during the hospital encounter of 03/05/12  POCT I-STAT TROPONIN I      Component Value Range   Troponin i, poc 0.01  0.00 - 0.08 ng/mL   Comment 3           PRO B NATRIURETIC PEPTIDE      Component Value Range   Pro B Natriuretic peptide (BNP) 20.7  0 - 125 pg/mL  CBC WITH DIFFERENTIAL      Component Value Range   WBC 5.7  4.0 - 10.5 K/uL   RBC 4.49  3.87 - 5.11 MIL/uL   Hemoglobin 11.0 (*) 12.0 - 15.0 g/dL   HCT 16.1 (*) 09.6 - 04.5 %   MCV 75.5 (*) 78.0 - 100.0 fL   MCH 24.5 (*) 26.0 - 34.0 pg   MCHC 32.4  30.0 - 36.0 g/dL   RDW 40.9  81.1 - 91.4 %   Platelets 340  150 - 400 K/uL   Neutrophils Relative 54  43 - 77 %   Neutro Abs 3.0  1.7 - 7.7 K/uL   Lymphocytes Relative 27  12 - 46 %   Lymphs Abs 1.5  0.7 - 4.0 K/uL   Monocytes Relative 16 (*) 3 - 12 %   Monocytes Absolute 0.9  0.1 - 1.0 K/uL   Eosinophils Relative 4  0 - 5 %   Eosinophils Absolute 0.2  0.0 - 0.7 K/uL   Basophils Relative 0  0 - 1 %   Basophils Absolute 0.0  0.0 - 0.1 K/uL  BASIC METABOLIC PANEL      Component Value Range   Sodium 144  135 - 145 mEq/L   Potassium 3.9  3.5 - 5.1 mEq/L   Chloride 104  96 - 112 mEq/L   CO2 32  19 - 32 mEq/L   Glucose, Bld 89  70 - 99 mg/dL   BUN 10  6 - 23 mg/dL   Creatinine, Ser 7.82  0.50 - 1.10 mg/dL   Calcium 9.6  8.4 - 95.6 mg/dL   GFR calc non Af Amer 85 (*) >90 mL/min   GFR calc Af Amer >90  >90 mL/min    Dg Chest 2 View  03/05/2012  *RADIOLOGY REPORT*  Clinical Data: 51 year old female with chest pain shortness of breath.  CHEST - 2 VIEW  Comparison: 09/13/2011 and earlier.  Findings: Cardiac size at the upper limits of normal. Other mediastinal contours are within normal limits.  Visualized tracheal air column is within normal limits.  No pneumothorax, pulmonary edema, pleural effusion or acute pulmonary opacity.  IMPRESSION: No acute cardiopulmonary abnormality.   Original Report Authenticated By: Harley Hallmark, M.D.       No diagnosis found.    MDM  51 y/o female with intermittent chest pain and sob worse on exertion. Troponin negative. BNP WNL. CXR and EKG not concerning. Due to family hx and worsening symptoms, will consult  cardiology to admit for further testing. Not sending patient to CDU due to inability to perform exercise stress test and has BMI too large for CTA. Case discussed with Dr. Silverio Lay who agrees with plan of care.        Trevor Mace, PA-C 03/06/12 0013

## 2012-03-05 NOTE — H&P (Signed)
CARDIOLOGY ADMISSION NOTE  Patient ID: Hannah Huerta MRN: [id number] DOB/AGE: 1960/10/24 51 y.o.  Admit date: 03/05/2012 Primary Physician   None Primary Cardiologist   None Chief Complaint    Chest pain  HPI:  The patient presents for evaluation of dizziness. She had 2 episodes of this this morning while at work. These passed.  She subsequently felt fatigued in no energy throughout the day. She went home the evening and had another episode of dizziness. She felt the room spinning. There was no loss of speech or motor function. She might have felt her heart racing. She did not have any presyncope or syncope. She presented to the emergency room her she also describes some chest discomfort. EKG demonstrated no acute abnormalities. She describes sharp pains under her left breast. This has been happening for a couple of weeks almost daily. It is moderate to severe in intensity. It doesn't radiate to her neck or to her arms. She seems to get with emotional stress but not with physical exertion. She works in [employer] and does do physical activity. She may get somewhat short of breath with this activity but that is vague. She's not describing PND or orthopnea. She's not fevers or chills. She does have an occasional cough. She's had some mild lower extremity edema which comes and goes. She's never really had these symptoms before. She never had any cardiac workup.   Past Medical History  Diagnosis Date  . Obesity   . Hypertension   . GERD (gastroesophageal reflux disease)     Past Surgical History  Procedure Date  . Cholecystectomy   . Tubal ligation   . Spine surgery     fusion    No Known Allergies  No Home Medications   History   Social History  . Marital Status: Married    Spouse Name: N/A    Number of Children: 3  . Years of Education: N/A   Occupational History  . Works in the [employer]   Social History Main Topics  . Smoking status: Never Smoker   . Smokeless  tobacco: Not on file  . Alcohol Use: No  . Drug Use: No  . Sexually Active: Yes    Birth Control/ Protection: Surgical   Other Topics Concern  . Not on file   Social History Narrative   Lives at home with husband and two of her sons.      Family History  Problem Relation Age of Onset  . Coronary artery disease Father 29  . Sudden death Mother 36    Questionable MI    ROS:  Sweating, mild cough.  Otherwise as stated in the HPI and negative for all other systems.  Physical Exam: Blood pressure 142/86, pulse 63, temperature 98 F (36.7 C), temperature source Oral, resp. rate 21, SpO2 99.00%.  GENERAL:  Well appearing HEENT:  Pupils equal round and reactive, fundi not visualized, oral mucosa unremarkable NECK:  No jugular venous distention, waveform within normal limits, carotid upstroke brisk and symmetric, no bruits, no thyromegaly LYMPHATICS:  No cervical, inguinal adenopathy LUNGS:  Clear to auscultation bilaterally BACK:  No CVA tenderness CHEST:  Unremarkable HEART:  PMI not displaced or sustained,S1 and S2 within normal limits, no S3, no S4, no clicks, no rubs, no murmurs ABD:  Flat, positive bowel sounds normal in frequency in pitch, no bruits, no rebound, no guarding, no midline pulsatile mass, no hepatomegaly, no splenomegaly, obese EXT:  2 plus pulses throughout, no edema,  no cyanosis no clubbing SKIN:  No rashes no nodules NEURO:  Cranial nerves II through XII grossly intact, motor grossly intact throughout PSYCH:  Cognitively intact, oriented to person place and time  Labs: Lab Results  Component Value Date   BUN 10 03/05/2012   Lab Results  Component Value Date   CREATININE 0.80 03/05/2012   Lab Results  Component Value Date   NA 144 03/05/2012   K 3.9 03/05/2012   CL 104 03/05/2012   CO2 32 03/05/2012   No results found for this basename: CKTOTAL,  CKMB,  CKMBINDEX,  TROPONINI   Lab Results  Component Value Date   WBC 5.7 03/05/2012   HGB 11.0* 03/05/2012     HCT 33.9* 03/05/2012   MCV 75.5* 03/05/2012   PLT 340 03/05/2012      Radiology:  CXR:  No acute cardiopulmonary abnormality.   EKG:  NSR, rate 71, LAD, no acute ST T wave changes.  03/05/2012   ASSESSMENT AND PLAN:    Chest pain - Her chest pain is atypical for angina. There is no objective evidence of ischemia. I think she can be ruled out for myocardial infarction. If her enzymes are negative I would suggest stress perfusion imaging.     Anemia -  This has been chronic. I will check stool guaiacs but there's been no active evidence of bleeding.  Dizziness - She had this while on telemetry and there were no arrhythmias. Check orthostatic blood pressures.  SignedRollene Rotunda 03/05/2012, 11:51 PM

## 2012-03-05 NOTE — ED Notes (Signed)
C/o left-sided cp - nonrad. - onset of this episode x30 min; ccm showing SR rate 63 without ectopy

## 2012-03-06 ENCOUNTER — Observation Stay (HOSPITAL_COMMUNITY): Payer: BC Managed Care – PPO

## 2012-03-06 DIAGNOSIS — R42 Dizziness and giddiness: Secondary | ICD-10-CM

## 2012-03-06 DIAGNOSIS — R072 Precordial pain: Secondary | ICD-10-CM | POA: Diagnosis present

## 2012-03-06 LAB — CARDIAC PANEL(CRET KIN+CKTOT+MB+TROPI)
CK, MB: 4.4 ng/mL — ABNORMAL HIGH (ref 0.3–4.0)
CK, MB: 4.4 ng/mL — ABNORMAL HIGH (ref 0.3–4.0)
CK, MB: 4.3 ng/mL — ABNORMAL HIGH (ref 0.3–4.0)
Relative Index: 1.4 (ref 0.0–2.5)
Relative Index: 1.5 (ref 0.0–2.5)
Relative Index: 1.5 (ref 0.0–2.5)
Total CK: 308 U/L — ABNORMAL HIGH (ref 7–177)
Total CK: 293 U/L — ABNORMAL HIGH (ref 7–177)
Total CK: 284 U/L — ABNORMAL HIGH (ref 7–177)
Troponin I: <0.3 ng/mL (ref <0.30)
Troponin I: <0.3 ng/mL (ref <0.30)
Troponin I: <0.3 ng/mL (ref <0.30)

## 2012-03-06 LAB — BASIC METABOLIC PANEL
BUN: 10 mg/dL (ref 6–23)
CO2: 32 mEq/L (ref 19–32)
Calcium: 9 mg/dL (ref 8.4–10.5)
Chloride: 105 mEq/L (ref 96–112)
Creatinine, Ser: 0.84 mg/dL (ref 0.50–1.10)
GFR calc Af Amer: >90 mL/min (ref >90)
GFR calc non Af Amer: 80 mL/min — ABNORMAL LOW (ref >90)
Glucose, Bld: 96 mg/dL (ref 70–99)
Potassium: 4 mEq/L (ref 3.5–5.1)
Sodium: 141 mEq/L (ref 135–145)

## 2012-03-06 MED ORDER — MORPHINE SULFATE 4 MG/ML IJ SOLN
4.0000 mg | INTRAMUSCULAR | Status: DC | PRN
  Administered 2012-03-06 – 2012-03-08 (×3): 4 mg via INTRAVENOUS
  Filled 2012-03-06 (×3): qty 1

## 2012-03-06 MED ORDER — NITROGLYCERIN 2 % TD OINT
1.0000 [in_us] | TOPICAL_OINTMENT | Freq: Once | TRANSDERMAL | Status: AC
  Administered 2012-03-06: 1 [in_us] via TOPICAL
  Filled 2012-03-06: qty 1

## 2012-03-06 MED ORDER — TECHNETIUM TC 99M TETROFOSMIN IV KIT
30.0000 | PACK | Freq: Once | INTRAVENOUS | Status: AC | PRN
  Administered 2012-03-06: 30 via INTRAVENOUS

## 2012-03-06 MED ORDER — REGADENOSON 0.4 MG/5ML IV SOLN
0.4000 mg | Freq: Once | INTRAVENOUS | Status: AC
  Administered 2012-03-07: 0.4 mg via INTRAVENOUS
  Filled 2012-03-06: qty 5

## 2012-03-06 MED ORDER — ASPIRIN 325 MG PO TABS
325.0000 mg | ORAL_TABLET | Freq: Every day | ORAL | Status: DC
  Administered 2012-03-06 – 2012-03-07 (×2): 325 mg via ORAL
  Filled 2012-03-06 (×3): qty 1

## 2012-03-06 MED ORDER — OXYCODONE-ACETAMINOPHEN 5-325 MG PO TABS
1.0000 | ORAL_TABLET | Freq: Four times a day (QID) | ORAL | Status: DC | PRN
  Administered 2012-03-06: 1 via ORAL
  Filled 2012-03-06: qty 1

## 2012-03-06 MED ORDER — NITROGLYCERIN 0.4 MG SL SUBL
SUBLINGUAL_TABLET | SUBLINGUAL | Status: AC
  Administered 2012-03-06: 0.3 mg via SUBLINGUAL
  Filled 2012-03-06: qty 25

## 2012-03-06 MED ORDER — NITROGLYCERIN 0.3 MG SL SUBL
0.3000 mg | SUBLINGUAL_TABLET | SUBLINGUAL | Status: DC | PRN
  Administered 2012-03-06 (×5): 0.3 mg via SUBLINGUAL
  Filled 2012-03-06: qty 100

## 2012-03-06 NOTE — ED Notes (Signed)
Pt now reports "heaviness" to left side of chest

## 2012-03-06 NOTE — Progress Notes (Signed)
   PROGRESS NOTE  Subjective:   Hannah Huerta is a 51 yo with hx of morbid obesity admitted with dizziness and chest pain.  Cardiac enzymes are negative x 1 so far.   Objective:    Vital Signs:   Temp:  [97.5 F (36.4 C)-98.4 F (36.9 C)] 97.9 F (36.6 C) (08/21 0716) Pulse Rate:  [58-74] 58  (08/21 1004) Resp:  [16-21] 18  (08/21 0721) BP: (110-149)/(56-106) 131/76 mmHg (08/21 0721) SpO2:  [93 %-99 %] 97 % (08/21 0721) Weight:  [298 lb 15.1 oz (135.6 kg)] 298 lb 15.1 oz (135.6 kg) (08/21 0153)  Last BM Date: 03/05/12   24-hour weight change: Weight change:   Weight trends: Filed Weights   03/06/12 0153  Weight: 298 lb 15.1 oz (135.6 kg)    Intake/Output:        Physical Exam: BP 131/76  Pulse 58  Temp 97.9 F (36.6 C) (Oral)  Resp 18  Ht 5\' 2"  (1.575 m)  Wt 298 lb 15.1 oz (135.6 kg)  BMI 54.68 kg/m2  SpO2 97%  General: Vital signs reviewed and noted. Well-developed, well-nourished, in no acute distress; alert, appropriate and cooperative .  Head: Normocephalic, atraumatic.  Eyes: conjunctivae/corneas clear.  EOM's intact.   Throat: normal  Neck: Supple. Normal carotids. No JVD  Lungs:  Clear to auscultation  Heart: Regular rate,  With normal  S1 S2. No murmurs, gallops or rubs  Abdomen:  Soft, non-tender, non-distended with normoactive bowel sounds. No hepatomegaly. No rebound/guarding. No abdominal masses.  Extremities: Distal pedal pulses are 2+ .  No edema.    Neurologic: A&O X3, CN II - XII are grossly intact. Motor strength is 5/5 in the all 4 extremities.  Psych: Responds to questions appropriately with normal affect.    Labs: BMET:  Basename 03/06/12 0632 03/05/12 2149  NA 141 144  K 4.0 3.9  CL 105 104  CO2 32 32  GLUCOSE 96 89  BUN 10 10  CREATININE 0.84 0.80  CALCIUM 9.0 9.6  MG -- --  PHOS -- --    Liver function tests: No results found for this basename: AST:2,ALT:2,ALKPHOS:2,BILITOT:2,PROT:2,ALBUMIN:2 in the last 72 hours No  results found for this basename: LIPASE:2,AMYLASE:2 in the last 72 hours  CBC:  Basename 03/05/12 2149  WBC 5.7  NEUTROABS 3.0  HGB 11.0*  HCT 33.9*  MCV 75.5*  PLT 340    Cardiac Enzymes:  Basename 03/06/12 0632  CKTOTAL 308*  CKMB 4.4*  TROPONINI <0.30     Tele:  NSR  Medications:    Infusions:    Scheduled Medications:    . aspirin  325 mg Oral Daily  . nitroGLYCERIN  1 inch Topical Once  . oxyCODONE-acetaminophen  1 tablet Oral Once  . regadenoson  0.4 mg Intravenous Once    Assessment/ Plan:    Precordial pain (03/06/2012) Atypical CP.  Enzymes are negative so far.  Will get a 2 day myoview study.  Pain gets better with morphine  Dizziness (03/06/2012)   Disposition: for Hannah Huerta today and tomorrow.   Length of Stay: 1  Vesta Mixer, Montez Hageman., MD, [facility] 03/06/2012, 11:28 AM Office [phone] Pager [phone]

## 2012-03-06 NOTE — Progress Notes (Signed)
Orthostatic V/S complete. Pt has no complaints of dizziness or weakness when standing. V/S documented in flow sheet.

## 2012-03-06 NOTE — ED Notes (Signed)
Spoke with Dr.Hockrein in reference to pt's relief of CP after NTG x3; orders given for NTG paste

## 2012-03-06 NOTE — Progress Notes (Signed)
Nutrition Brief Note  Patient identified on the Malnutrition Screening Tool (MST) report for recent weight loss without trying, generating a score of 2. Patient reports she was in fact trying to lose weight.  Body mass index is 54.68 kg/(m^2). Pt meets criteria for Obesity Class III based on current BMI.  Current diet order is currently NPO, however, reports a good appetite. Labs and medications reviewed.   No nutrition interventions warranted at this time. If nutrition issues arise, please consult RD.   Kirkland Hun, RD, LDN Pager #: [phone] After-Hours Pager #: [phone]

## 2012-03-06 NOTE — ED Provider Notes (Signed)
Medical screening examination/treatment/procedure(s) were performed by non-physician practitioner and as supervising physician I was immediately available for consultation/collaboration.   Richardean Canal, MD 03/06/12 1149

## 2012-03-06 NOTE — ED Notes (Signed)
Sandwich meal given to pt's husband per his request

## 2012-03-06 NOTE — Care Management Note (Unsigned)
    Page 1 of 1   03/08/2012     9:30:00 AM   CARE MANAGEMENT NOTE 03/08/2012  Patient:  Hannah Huerta, Hannah Huerta   Account Number:  [id number]  Date Initiated:  03/06/2012  Documentation initiated by:  SIMMONS,Keiara Sneeringer  Subjective/Objective Assessment:   ADMITTED WITH CP; LIVES AT HOME WITH HUSBAND- JOHN; WAS IPTA- WORKS AT [employer] ON [address]; USES [facility] ON [address] FOR RX.     Action/Plan:   DISCHARGE PLANNING DISCUSSED AT BEDSIDE.   Anticipated DC Date:  03/07/2012   Anticipated DC Plan:  HOME/SELF CARE      DC Planning Services  CM consult  PCP issues      Choice offered to / List presented to:             Status of service:  In process, will continue to follow Medicare Important Message given?   (If response is "NO", the following Medicare IM given date fields will be blank) Date Medicare IM given:   Date Additional Medicare IM given:    Discharge Disposition:    Per UR Regulation:  Reviewed for med. necessity/level of care/duration of stay  If discussed at Long Length of Stay Meetings, dates discussed:    Comments:  03/08/12  0924  Briyonna Omara SIMMONS RN, BSN [phone] PROVIDED PT WITH PCP CARE RESOURCES SHEET- SHE AGREED TO CALL AND MAKE AN APPT FOR PCP.  03/06/12  0845  Kinneth Fujiwara SIMMONS RN, BSN [phone] NCM WILL FOLLOW.

## 2012-03-07 ENCOUNTER — Observation Stay (HOSPITAL_COMMUNITY): Payer: BC Managed Care – PPO

## 2012-03-07 DIAGNOSIS — R079 Chest pain, unspecified: Secondary | ICD-10-CM

## 2012-03-07 LAB — CBC WITH DIFFERENTIAL/PLATELET
Basophils Absolute: 0 10*3/uL (ref 0.0–0.1)
Basophils Relative: 0 % (ref 0–1)
Eosinophils Absolute: 0.2 10*3/uL (ref 0.0–0.7)
Eosinophils Relative: 5 % (ref 0–5)
HCT: 32.8 % — ABNORMAL LOW (ref 36.0–46.0)
Hemoglobin: 10.6 g/dL — ABNORMAL LOW (ref 12.0–15.0)
Lymphocytes Relative: 28 % (ref 12–46)
Lymphs Abs: 1.2 10*3/uL (ref 0.7–4.0)
MCH: 24.8 pg — ABNORMAL LOW (ref 26.0–34.0)
MCHC: 32.3 g/dL (ref 30.0–36.0)
MCV: 76.8 fL — ABNORMAL LOW (ref 78.0–100.0)
Monocytes Absolute: 0.6 10*3/uL (ref 0.1–1.0)
Monocytes Relative: 13 % — ABNORMAL HIGH (ref 3–12)
Neutro Abs: 2.3 10*3/uL (ref 1.7–7.7)
Neutrophils Relative %: 53 % (ref 43–77)
Platelets: 289 10*3/uL (ref 150–400)
RBC: 4.27 MIL/uL (ref 3.87–5.11)
RDW: 15.4 % (ref 11.5–15.5)
WBC: 4.3 10*3/uL (ref 4.0–10.5)

## 2012-03-07 MED ORDER — TECHNETIUM TC 99M TETROFOSMIN IV KIT
30.0000 | PACK | Freq: Once | INTRAVENOUS | Status: AC | PRN
  Administered 2012-03-06: 30 via INTRAVENOUS

## 2012-03-07 MED ORDER — SODIUM CHLORIDE 0.9 % IV SOLN
1.0000 mL/kg/h | INTRAVENOUS | Status: DC
  Administered 2012-03-08: 1 mL/kg/h via INTRAVENOUS

## 2012-03-07 MED ORDER — ASPIRIN 81 MG PO CHEW
324.0000 mg | CHEWABLE_TABLET | ORAL | Status: AC
  Administered 2012-03-08: 324 mg via ORAL
  Filled 2012-03-07: qty 4

## 2012-03-07 MED ORDER — TECHNETIUM TC 99M TETROFOSMIN IV KIT
30.0000 | PACK | Freq: Once | INTRAVENOUS | Status: AC | PRN
  Administered 2012-03-07: 30 via INTRAVENOUS

## 2012-03-07 NOTE — Progress Notes (Signed)
   PROGRESS NOTE  Subjective:   Hannah Huerta is a 51 yo with hx of morbid obesity admitted with dizziness and chest pain.  Cardiac enzymes are negative x 1 so far.  Still having continuous pain under left breast - atypical, noncardiac sounding pain.  Cardiac enzymes are negative.  Objective:    Vital Signs:   Temp:  [97.9 F (36.6 C)-99.1 F (37.3 C)] 97.9 F (36.6 C) (08/22 0613) Pulse Rate:  [58-68] 68  (08/22 0613) Resp:  [18-19] 19  (08/22 0613) BP: (136-159)/(82-92) 136/86 mmHg (08/22 0651) SpO2:  [97 %] 97 % (08/22 0613)  Last BM Date: 03/06/12   24-hour weight change: Weight change:   Weight trends: Filed Weights   03/06/12 0153  Weight: 298 lb 15.1 oz (135.6 kg)    Intake/Output:  08/21 0701 - 08/22 0700 In: 600 [P.O.:600] Out: -      Physical Exam: BP 136/86  Pulse 68  Temp 97.9 F (36.6 C) (Oral)  Resp 19  Ht 5\' 2"  (1.575 m)  Wt 298 lb 15.1 oz (135.6 kg)  BMI 54.68 kg/m2  SpO2 97%  LMP 02/15/2012  General: Vital signs reviewed and noted. Well-developed, well-nourished, in no acute distress; alert, appropriate and cooperative. Morbidly obese  Head: Normocephalic, atraumatic.  Eyes: conjunctivae/corneas clear.  EOM's intact.   Throat: normal  Neck: Supple. Normal carotids. No JVD  Lungs:  Clear to auscultation  Heart: Regular rate,  With normal  S1 S2. No murmurs, gallops or rubs  Abdomen:  Soft, non-tender, non-distended with normoactive bowel sounds. No hepatomegaly. No rebound/guarding. No abdominal masses.  Extremities: Distal pedal pulses are 2+ .  No edema.    Neurologic: A&O X3, CN II - XII are grossly intact. Motor strength is 5/5 in the all 4 extremities.  Psych: Responds to questions appropriately with normal affect.    Labs: BMET:  Basename 03/06/12 0632 03/05/12 2149  NA 141 144  K 4.0 3.9  CL 105 104  CO2 32 32  GLUCOSE 96 89  BUN 10 10  CREATININE 0.84 0.80  CALCIUM 9.0 9.6  MG -- --  PHOS -- --    Liver function  tests: No results found for this basename: AST:2,ALT:2,ALKPHOS:2,BILITOT:2,PROT:2,ALBUMIN:2 in the last 72 hours No results found for this basename: LIPASE:2,AMYLASE:2 in the last 72 hours  CBC:  Basename 03/07/12 0555 03/05/12 2149  WBC 4.3 5.7  NEUTROABS 2.3 3.0  HGB 10.6* 11.0*  HCT 32.8* 33.9*  MCV 76.8* 75.5*  PLT 289 340    Cardiac Enzymes:  Basename 03/06/12 1659 03/06/12 1057 03/06/12 0632  CKTOTAL 284* 293* 308*  CKMB 4.3* 4.4* 4.4*  TROPONINI <0.30 <0.30 <0.30     Tele:  NSR  Medications:    Infusions:    Scheduled Medications:    . aspirin  325 mg Oral Daily  . regadenoson  0.4 mg Intravenous Once    Assessment/ Plan:    Precordial pain (03/06/2012) Atypical CP.  Enzymes are negative so far.  Getting the second scan of  a 2 day myoview study.  Pain gets better with morphine.  OK for discharge if the myoview is negative.    She will need to get a medical doctor to evaluate her further.   Dizziness (03/06/2012)   Disposition: home today if myoview is low risk    Length of Stay: 2  Vesta Mixer, Montez Hageman., MD, [facility] 03/07/2012, 7:54 AM Office [phone] Pager [phone]

## 2012-03-07 NOTE — Progress Notes (Signed)
Stress test was read as abnormal with moderate area of reversibility in the inferoapical region concerning for ischemia. Discussed with Dr. Elease Hashimoto. May be related to breast shadow but will need to proceed with heart cath to definite coronary anatomy. Procedure including risks, benefits and alternatives discussed with the patient who is agreeable to proceeding. All questions answered. I encouraged her to watch the cath video if available on the unit. Kiarra Kidd PA-C

## 2012-03-07 NOTE — Progress Notes (Signed)
Day 2/2 Lexiscan completed. Pt's BP elevated at baseline and rose during test but ?accuracy of BP cuff given fairly normal values while up on floor. Pt does not have hx of HTN and takes no medicines. Will need to trend once she's back in the room. Await images. Loring Liskey PA-C

## 2012-03-08 ENCOUNTER — Encounter (HOSPITAL_COMMUNITY): Payer: Self-pay | Admitting: Physician Assistant

## 2012-03-08 ENCOUNTER — Encounter (HOSPITAL_COMMUNITY)
Admission: EM | Disposition: A | Discharge status: Home / Self Care | Payer: Self-pay | Source: Home / Self Care | Attending: Cardiology

## 2012-03-08 DIAGNOSIS — I2510 Atherosclerotic heart disease of native coronary artery without angina pectoris: Secondary | ICD-10-CM

## 2012-03-08 DIAGNOSIS — D509 Iron deficiency anemia, unspecified: Secondary | ICD-10-CM | POA: Diagnosis present

## 2012-03-08 HISTORY — PX: LEFT HEART CATHETERIZATION WITH CORONARY ANGIOGRAM: SHX5451

## 2012-03-08 LAB — IRON AND TIBC
Iron: 44 ug/dL (ref 42–135)
Saturation Ratios: 13 % — ABNORMAL LOW (ref 20–55)
TIBC: 336 ug/dL (ref 250–470)
UIBC: 292 ug/dL (ref 125–400)

## 2012-03-08 LAB — BASIC METABOLIC PANEL
BUN: 10 mg/dL (ref 6–23)
CO2: 32 mEq/L (ref 19–32)
Calcium: 9.2 mg/dL (ref 8.4–10.5)
Chloride: 101 mEq/L (ref 96–112)
Creatinine, Ser: 0.74 mg/dL (ref 0.50–1.10)
GFR calc Af Amer: >90 mL/min (ref >90)
GFR calc non Af Amer: >90 mL/min (ref >90)
Glucose, Bld: 105 mg/dL — ABNORMAL HIGH (ref 70–99)
Potassium: 3.8 mEq/L (ref 3.5–5.1)
Sodium: 139 mEq/L (ref 135–145)

## 2012-03-08 LAB — PROTIME-INR
INR: 1.07 (ref 0.00–1.49)
Prothrombin Time: 14.1 seconds (ref 11.6–15.2)

## 2012-03-08 LAB — LIPID PANEL
Cholesterol: 180 mg/dL (ref 0–200)
HDL: 57 mg/dL (ref >39)
LDL Cholesterol: 103 mg/dL — ABNORMAL HIGH (ref 0–99)
Total CHOL/HDL Ratio: 3.2 RATIO
Triglycerides: 98 mg/dL (ref <150)
VLDL: 20 mg/dL (ref 0–40)

## 2012-03-08 LAB — CBC
HCT: 32.9 % — ABNORMAL LOW (ref 36.0–46.0)
Hemoglobin: 10.6 g/dL — ABNORMAL LOW (ref 12.0–15.0)
MCH: 24.4 pg — ABNORMAL LOW (ref 26.0–34.0)
MCHC: 32.2 g/dL (ref 30.0–36.0)
MCV: 75.8 fL — ABNORMAL LOW (ref 78.0–100.0)
Platelets: 308 10*3/uL (ref 150–400)
RBC: 4.34 MIL/uL (ref 3.87–5.11)
RDW: 14.9 % (ref 11.5–15.5)
WBC: 4.1 10*3/uL (ref 4.0–10.5)

## 2012-03-08 LAB — HEPATIC FUNCTION PANEL
ALT: 17 U/L (ref 0–35)
AST: 20 U/L (ref 0–37)
Albumin: 3.3 g/dL — ABNORMAL LOW (ref 3.5–5.2)
Alkaline Phosphatase: 78 U/L (ref 39–117)
Bilirubin, Direct: <0.1 mg/dL (ref 0.0–0.3)
Total Bilirubin: 0.2 mg/dL — ABNORMAL LOW (ref 0.3–1.2)
Total Protein: 7.6 g/dL (ref 6.0–8.3)

## 2012-03-08 LAB — D-DIMER, QUANTITATIVE (NOT AT ARMC): D-Dimer, Quant: 0.59 ug/mL-FEU — ABNORMAL HIGH (ref 0.00–0.48)

## 2012-03-08 LAB — GLUCOSE, CAPILLARY: Glucose-Capillary: 82 mg/dL (ref 70–99)

## 2012-03-08 SURGERY — LEFT HEART CATHETERIZATION WITH CORONARY ANGIOGRAM
Anesthesia: LOCAL

## 2012-03-08 MED ORDER — AMLODIPINE BESYLATE 5 MG PO TABS
5.0000 mg | ORAL_TABLET | Freq: Every day | ORAL | Status: DC
  Administered 2012-03-08 – 2012-03-09 (×2): 5 mg via ORAL
  Filled 2012-03-08 (×3): qty 1

## 2012-03-08 MED ORDER — TAB-A-VITE/IRON PO TABS
1.0000 | ORAL_TABLET | Freq: Every day | ORAL | Status: DC
  Administered 2012-03-08 – 2012-03-09 (×2): 1 via ORAL
  Filled 2012-03-08 (×2): qty 1

## 2012-03-08 MED ORDER — FENTANYL CITRATE 0.05 MG/ML IJ SOLN
INTRAMUSCULAR | Status: AC
  Filled 2012-03-08: qty 2

## 2012-03-08 MED ORDER — NITROGLYCERIN 0.2 MG/ML ON CALL CATH LAB
INTRAVENOUS | Status: AC
  Filled 2012-03-08: qty 1

## 2012-03-08 MED ORDER — ASPIRIN 81 MG PO CHEW
81.0000 mg | CHEWABLE_TABLET | Freq: Every morning | ORAL | Status: DC
  Administered 2012-03-09: 81 mg via ORAL
  Filled 2012-03-08: qty 1

## 2012-03-08 MED ORDER — SODIUM CHLORIDE 0.9 % IV SOLN: INTRAVENOUS | Status: AC

## 2012-03-08 MED ORDER — ACETAMINOPHEN 325 MG PO TABS: 650.0000 mg | ORAL_TABLET | ORAL | Status: DC | PRN

## 2012-03-08 MED ORDER — HEPARIN SODIUM (PORCINE) 1000 UNIT/ML IJ SOLN
INTRAMUSCULAR | Status: AC
  Filled 2012-03-08: qty 1

## 2012-03-08 MED ORDER — ONDANSETRON HCL 4 MG/2ML IJ SOLN: 4.0000 mg | Freq: Four times a day (QID) | INTRAMUSCULAR | Status: DC | PRN

## 2012-03-08 MED ORDER — LIDOCAINE HCL (PF) 1 % IJ SOLN
INTRAMUSCULAR | Status: AC
  Filled 2012-03-08: qty 30

## 2012-03-08 MED ORDER — VERAPAMIL HCL 2.5 MG/ML IV SOLN
INTRAVENOUS | Status: AC
  Filled 2012-03-08: qty 2

## 2012-03-08 MED ORDER — ALPRAZOLAM 0.25 MG PO TABS: 0.2500 mg | ORAL_TABLET | Freq: Two times a day (BID) | ORAL | Status: DC | PRN

## 2012-03-08 MED ORDER — HEPARIN (PORCINE) IN NACL 2-0.9 UNIT/ML-% IJ SOLN
INTRAMUSCULAR | Status: AC
  Filled 2012-03-08: qty 2000

## 2012-03-08 MED ORDER — ZOLPIDEM TARTRATE 5 MG PO TABS: 5.0000 mg | ORAL_TABLET | Freq: Every evening | ORAL | Status: DC | PRN

## 2012-03-08 MED ORDER — MIDAZOLAM HCL 2 MG/2ML IJ SOLN
INTRAMUSCULAR | Status: AC
  Filled 2012-03-08: qty 2

## 2012-03-08 NOTE — Progress Notes (Signed)
Patient Name: Hannah Huerta Date of Encounter: 03/08/2012  Principal Problem:  *Precordial pain Active Problems:  Dizziness    SUBJECTIVE: Had chest pain this am, resolved with Morphine. No SOB/Dizziness. Has her period now, so no chance she could be pregnant. Has been told in the past she needs to take iron. No family MD so no lipid check.  OBJECTIVE Filed Vitals:   03/07/12 0944 03/07/12 1352 03/07/12 2124 03/08/12 0443  BP: 182/90 142/80 155/73 156/86  Pulse: 76 55 69 62  Temp:  98.1 F (36.7 C) 98.4 F (36.9 C) 97.6 F (36.4 C)  TempSrc:  Oral Oral Oral  Resp:  18 16 18   Height:      Weight:      SpO2:  98% 96% 95%    Intake/Output Summary (Last 24 hours) at 03/08/12 0827 Last data filed at 03/08/12 0300  Gross per 24 hour  Intake    600 ml  Output      5 ml  Net    595 ml   Filed Weights   03/06/12 0153  Weight: 298 lb 15.1 oz (135.6 kg)     PHYSICAL EXAM General: Well developed, well nourished, female in no acute distress. Head: Normocephalic, atraumatic.  Neck: Supple without bruits, JVD not elevated. Lungs:  Resp regular and unlabored, CTA bilaterally. Heart: RRR, S1, S2, no S3, S4, or murmur. Abdomen: Soft, non-tender, non-distended, BS + x 4.  Extremities: No clubbing, cyanosis, no edema.  Neuro: Alert and oriented X 3. Moves all extremities spontaneously. Psych: Normal affect.  LABS: CBC: Basename 03/08/12 0620 03/07/12 0555 03/05/12 2149  WBC 4.1 4.3 --  NEUTROABS -- 2.3 3.0  HGB 10.6* 10.6* --  HCT 32.9* 32.8* --  MCV 75.8* 76.8* --  PLT 308 289 --   INR: Basename 03/08/12 0620  INR 1.07   Basic Metabolic Panel: Basename 03/08/12 0620 03/06/12 0632  NA 139 141  K 3.8 4.0  CL 101 105  CO2 32 32  GLUCOSE 105* 96  BUN 10 10  CREATININE 0.74 0.84  CALCIUM 9.2 9.0  MG -- --  PHOS -- --   Cardiac Enzymes: Basename 03/06/12 1659 03/06/12 1057 03/06/12 0632  CKTOTAL 284* 293* 308*  CKMB 4.3* 4.4* 4.4*  CKMBINDEX -- -- --    TROPONINI <0.30 <0.30 <0.30    Basename 03/05/12 2009  TROPIPOC 0.01   BNP: Pro B Natriuretic peptide (BNP)  Date/Time Value Range Status  03/05/2012  9:22 PM 20.7  0 - 125 pg/mL Final  03/11/2009  2:50 AM <30.0  0.0 - 100.0 pg/mL Final    TELE:  SR      Radiology/Studies: Dg Chest 2 View 03/05/2012  *RADIOLOGY REPORT*  Clinical Data: 51 year old female with chest pain shortness of breath.  CHEST - 2 VIEW  Comparison: 09/13/2011 and earlier.  Findings: Cardiac size at the upper limits of normal. Other mediastinal contours are within normal limits.  Visualized tracheal air column is within normal limits.  No pneumothorax, pulmonary edema, pleural effusion or acute pulmonary opacity.  IMPRESSION: No acute cardiopulmonary abnormality.   Original Report Authenticated By: Harley Hallmark, M.D.    Nm Myocar Multi W/spect W/wall Motion / Ef 03/07/2012  *RADIOLOGY REPORT*  Clinical Data:  Chest pain  MYOCARDIAL IMAGING WITH SPECT (REST AND PHARMACOLOGIC-STRESS - 2 DAY PROTOCOL) GATED LEFT VENTRICULAR WALL MOTION STUDY LEFT VENTRICULAR EJECTION FRACTION  Technique:  Standard myocardial SPECT imaging was performed after intravenous injection of 30 mCi Tc-65m tetrofosmin  at rest.  On a different day, intravenous infusion of  Lexiscan was performed under supervision of the Cardiology staff.  At peak effect of the drug, 30 mCi Tc-59m tetrofosmin was injected intravenously and standard myocardial SPECT imaging was performed.  Quantitative gated imaging was also performed to evaluate left ventricular wall motion and estimate left ventricular ejection fraction.  Comparison:  None  Findings: SPECT imaging demonstrates moderate area of reversibility in the inferoapical region concerning for ischemia.  No fixed defect.  Quantitative gated analysis shows normal wall motion.  The resting left ventricular ejection fraction is 59% with end- diastolic volume of 120 ml and end-systolic volume of 49 ml.  IMPRESSION:  Inferoapical reversibility concerning for ischemia.  Ejection fraction 59%.   Original Report Authenticated By: Cyndie Chime, M.D.     Current Medications:     . aspirin  324 mg Oral Pre-Cath  . aspirin  325 mg Oral Daily  . regadenoson  0.4 mg Intravenous Once      . sodium chloride 1 mL/kg/hr (03/08/12 0530)    ASSESSMENT AND PLAN: Principal Problem:  *Precordial pain - abnormal stress test, cath today  Active Problems:  Dizziness - ?secondary to anemia  Anemia - chronic, ck iron, add MVI  Unknown lipid status - ck profile, add statin PRN  Morbid obesity - encourage heart-healthy diet.  ?hypertension - SBP 130s-180s since admit, HR 50s at times,  MD advise on starting Rx.  Plan - d/c when med stable.  SignedTheodore Demark , PA-C 8:27 AM 03/08/2012  Patient seen.  Dr. Elease Hashimoto has recommended cardiac cath. I have reviewed that with the patient, and she consents.  Multiple issues noted, including microcytic anemia.  Will approach from the radial if possible.   Risks, alternatives, and benefits have been reviewed, and she consents to proceed.

## 2012-03-08 NOTE — CV Procedure (Signed)
   Cardiac Catheterization Procedure Note  Name: Hannah Huerta MRN: [id number] DOB: Apr 27, 1961  Procedure: Left Heart Cath, Selective Coronary Angiography, LV angiography  Indication: abnormal CV function test with chest pain.    Procedural Details: The right wrist was prepped, draped, and anesthetized with 1% lidocaine. Using the modified Seldinger technique, a 5 French sheath was introduced into the right radial artery. 3 mg of verapamil was administered through the sheath, weight-based unfractionated heparin was administered intravenously. Standard Judkins catheters were used for selective coronary angiography and left ventriculography. Catheter exchanges were performed over an exchange length guidewire. There were no immediate procedural complications. A TR band was used for radial hemostasis at the completion of the procedure.  The patient was transferred to the post catheterization recovery area for further monitoring.  Procedural Findings: Hemodynamics: AO 153/89 (115) LV 158/25   Coronary angiography: Coronary dominance: right  Left mainstem: No significant obstruction  Left anterior descending (LAD): The LAD wraps the apex and is a large caliber vessel.  There is some mid vessel calcium and mild irregularity, but there is extensive calcification of a smaller diagonal 2 branch.  The diagonal is moderate proximally with 60% ostial narrowing, and then tapers rapidly to a tiny vessel.  The small bifurcation branch probably has significant proximal narrowing, but is less than 1.10mm in size, and the distribution is small.  The distal LAD wraps the apex       Left circumflex (LCx): provides two large marginal branches and supplies the lateral apex.  These are free of disease.   Right coronary artery (RCA): Provides a single PDA and small PLA.  Smooth and without disease  Left ventriculography: Left ventricular systolic function is normal, LVEF is estimated at 55-65%, there is no  significant mitral regurgitation   Final Conclusions:   1.  Heavily calcified small D2 with ostial and mid disease  ---  Not suitable for PCI 2.  Preserved LV function 3.  Mild irregularities of the LAD   Recommendations:  1.  Consider treatment of BP  ? Amlodipine 2.  D dimer 3.  2D echo 4.  She likely needs a study for obstructive sleep apnea.   Films reviewed with husband.     Shawnie Pons 03/08/2012, 12:35 PM

## 2012-03-08 NOTE — H&P (View-Only) (Signed)
 Patient Name: Hannah Huerta Date of Encounter: 03/08/2012  Principal Problem:  *Precordial pain Active Problems:  Dizziness    SUBJECTIVE: Had chest pain this am, resolved with Morphine. No SOB/Dizziness. Has her period now, so no chance she could be pregnant. Has been told in the past she needs to take iron. No family MD so no lipid check.  OBJECTIVE Filed Vitals:   03/07/12 0944 03/07/12 1352 03/07/12 2124 03/08/12 0443  BP: 182/90 142/80 155/73 156/86  Pulse: 76 55 69 62  Temp:  98.1 F (36.7 C) 98.4 F (36.9 C) 97.6 F (36.4 C)  TempSrc:  Oral Oral Oral  Resp:  18 16 18  Height:      Weight:      SpO2:  98% 96% 95%    Intake/Output Summary (Last 24 hours) at 03/08/12 0827 Last data filed at 03/08/12 0300  Gross per 24 hour  Intake    600 ml  Output      5 ml  Net    595 ml   Filed Weights   03/06/12 0153  Weight: 298 lb 15.1 oz (135.6 kg)     PHYSICAL EXAM General: Well developed, well nourished, female in no acute distress. Head: Normocephalic, atraumatic.  Neck: Supple without bruits, JVD not elevated. Lungs:  Resp regular and unlabored, CTA bilaterally. Heart: RRR, S1, S2, no S3, S4, or murmur. Abdomen: Soft, non-tender, non-distended, BS + x 4.  Extremities: No clubbing, cyanosis, no edema.  Neuro: Alert and oriented X 3. Moves all extremities spontaneously. Psych: Normal affect.  LABS: CBC: Basename 03/08/12 0620 03/07/12 0555 03/05/12 2149  WBC 4.1 4.3 --  NEUTROABS -- 2.3 3.0  HGB 10.6* 10.6* --  HCT 32.9* 32.8* --  MCV 75.8* 76.8* --  PLT 308 289 --   INR: Basename 03/08/12 0620  INR 1.07   Basic Metabolic Panel: Basename 03/08/12 0620 03/06/12 0632  NA 139 141  K 3.8 4.0  CL 101 105  CO2 32 32  GLUCOSE 105* 96  BUN 10 10  CREATININE 0.74 0.84  CALCIUM 9.2 9.0  MG -- --  PHOS -- --   Cardiac Enzymes: Basename 03/06/12 1659 03/06/12 1057 03/06/12 0632  CKTOTAL 284* 293* 308*  CKMB 4.3* 4.4* 4.4*  CKMBINDEX -- -- --    TROPONINI <0.30 <0.30 <0.30    Basename 03/05/12 2009  TROPIPOC 0.01   BNP: Pro B Natriuretic peptide (BNP)  Date/Time Value Range Status  03/05/2012  9:22 PM 20.7  0 - 125 pg/mL Final  03/11/2009  2:50 AM <30.0  0.0 - 100.0 pg/mL Final    TELE:  SR      Radiology/Studies: Dg Chest 2 View 03/05/2012  *RADIOLOGY REPORT*  Clinical Data: 50-year-old female with chest pain shortness of breath.  CHEST - 2 VIEW  Comparison: 09/13/2011 and earlier.  Findings: Cardiac size at the upper limits of normal. Other mediastinal contours are within normal limits.  Visualized tracheal air column is within normal limits.  No pneumothorax, pulmonary edema, pleural effusion or acute pulmonary opacity.  IMPRESSION: No acute cardiopulmonary abnormality.   Original Report Authenticated By: H.LEE HALL III, M.D.    Nm Myocar Multi W/spect W/wall Motion / Ef 03/07/2012  *RADIOLOGY REPORT*  Clinical Data:  Chest pain  MYOCARDIAL IMAGING WITH SPECT (REST AND PHARMACOLOGIC-STRESS - 2 DAY PROTOCOL) GATED LEFT VENTRICULAR WALL MOTION STUDY LEFT VENTRICULAR EJECTION FRACTION  Technique:  Standard myocardial SPECT imaging was performed after intravenous injection of 30 mCi Tc-99m tetrofosmin   at rest.  On a different day, intravenous infusion of  Lexiscan was performed under supervision of the Cardiology staff.  At peak effect of the drug, 30 mCi Tc-99m tetrofosmin was injected intravenously and standard myocardial SPECT imaging was performed.  Quantitative gated imaging was also performed to evaluate left ventricular wall motion and estimate left ventricular ejection fraction.  Comparison:  None  Findings: SPECT imaging demonstrates moderate area of reversibility in the inferoapical region concerning for ischemia.  No fixed defect.  Quantitative gated analysis shows normal wall motion.  The resting left ventricular ejection fraction is 59% with end- diastolic volume of 120 ml and end-systolic volume of 49 ml.  IMPRESSION:  Inferoapical reversibility concerning for ischemia.  Ejection fraction 59%.   Original Report Authenticated By: KEVIN G. DOVER, M.D.     Current Medications:     . aspirin  324 mg Oral Pre-Cath  . aspirin  325 mg Oral Daily  . regadenoson  0.4 mg Intravenous Once      . sodium chloride 1 mL/kg/hr (03/08/12 0530)    ASSESSMENT AND PLAN: Principal Problem:  *Precordial pain - abnormal stress test, cath today  Active Problems:  Dizziness - ?secondary to anemia  Anemia - chronic, ck iron, add MVI  Unknown lipid status - ck profile, add statin PRN  Morbid obesity - encourage heart-healthy diet.  ?hypertension - SBP 130s-180s since admit, HR 50s at times,  MD advise on starting Rx.  Plan - d/c when med stable.  Signed, Rhonda Barrett , PA-C 8:27 AM 03/08/2012  Patient seen.  Dr. Nahser has recommended cardiac cath. I have reviewed that with the patient, and she consents.  Multiple issues noted, including microcytic anemia.  Will approach from the radial if possible.   Risks, alternatives, and benefits have been reviewed, and she consents to proceed.  

## 2012-03-08 NOTE — Interval H&P Note (Signed)
History and Physical Interval Note:  03/08/2012 11:37 AM  Hannah Huerta  has presented today for surgery, with the diagnosis of Chest pain  The various methods of treatment have been discussed with the patient and family. After consideration of risks, benefits and other options for treatment, the patient has consented to  Procedure(s) (LRB): LEFT HEART CATHETERIZATION WITH CORONARY ANGIOGRAM (N/A) as a surgical intervention .  The patient's history has been reviewed, patient examined, no change in status, stable for surgery.  I have reviewed the patient's chart and labs.  Questions were answered to the patient's satisfaction.     Shawnie Pons

## 2012-03-08 NOTE — Progress Notes (Signed)
Utilization review complete 

## 2012-03-08 NOTE — Progress Notes (Addendum)
Stable post cath.  Wrist looks good.  Discussed in detail.  I have added amlodipine for her BP and an ASA. She needs regular iron and stool checks.  She also needs evaluation of likely OSA.  D-dimer is borderline. Echo ordered but pending.   She does have some disease in a tiny diagonal, but this should be managed conservatively.

## 2012-03-09 ENCOUNTER — Observation Stay (HOSPITAL_COMMUNITY): Payer: BC Managed Care – PPO

## 2012-03-09 ENCOUNTER — Encounter (HOSPITAL_COMMUNITY): Payer: Self-pay | Admitting: Nurse Practitioner

## 2012-03-09 ENCOUNTER — Other Ambulatory Visit (HOSPITAL_COMMUNITY): Payer: BC Managed Care – PPO

## 2012-03-09 DIAGNOSIS — I10 Essential (primary) hypertension: Secondary | ICD-10-CM

## 2012-03-09 DIAGNOSIS — E785 Hyperlipidemia, unspecified: Secondary | ICD-10-CM

## 2012-03-09 MED ORDER — TAB-A-VITE/IRON PO TABS: 1.0000 | ORAL_TABLET | Freq: Every day | ORAL | Status: DC

## 2012-03-09 MED ORDER — NITROGLYCERIN 0.4 MG SL SUBL: 0.4000 mg | SUBLINGUAL_TABLET | SUBLINGUAL | Status: DC | PRN

## 2012-03-09 MED ORDER — AMLODIPINE BESYLATE 5 MG PO TABS: 5.0000 mg | ORAL_TABLET | Freq: Every day | ORAL | Status: DC

## 2012-03-09 MED ORDER — ASPIRIN 81 MG PO CHEW: 81.0000 mg | CHEWABLE_TABLET | Freq: Every morning | ORAL | Status: DC

## 2012-03-09 MED ORDER — PRAVASTATIN SODIUM 40 MG PO TABS: 40.0000 mg | ORAL_TABLET | Freq: Every day | ORAL | Status: DC

## 2012-03-09 MED ORDER — IOHEXOL 350 MG/ML SOLN
75.0000 mL | Freq: Once | INTRAVENOUS | Status: AC | PRN
  Administered 2012-03-09: 75 mL via INTRAVENOUS

## 2012-03-09 NOTE — Plan of Care (Signed)
Problem: Phase III Progression Outcomes Goal: Hemodynamically stable Outcome: Completed/Met Date Met:  03/09/12 Pt's vital signs stable. Denies any pain. Feeling better now per pt. Plan is DC in the am. Goal: No anginal pain Outcome: Completed/Met Date Met:  03/09/12 Pain well controled with meds. Pain at patients tolerable level. Will continue to monitor and intervene if necessary. Denies chest pain.    Goal: Cath/PCI Path as indicated Outcome: Completed/Met Date Met:  03/09/12 Cath done plan is treat symptoms medically. Goal: Vascular site scale level 0 - I Vascular Site Scale Level 0: No bruising/bleeding/hematoma Level I (Mild): Bruising/Ecchymosis, minimal bleeding/ooozing, palpable hematoma < 3 cm Level II (Moderate): Bleeding not affecting hemodynamic parameters, pseudoaneurysm, palpable hematoma > 3 cm  Outcome: Completed/Met Date Met:  03/09/12 No hematomas. Goal: Discharge plan remains appropriate-arrangements made Outcome: Completed/Met Date Met:  03/09/12 Plan is DC when stable 8/24. Goal: Tolerating diet Outcome: Completed/Met Date Met:  03/09/12 Denies nausea vomiting or constipation.

## 2012-03-09 NOTE — Progress Notes (Signed)
Patient Name: Hannah Huerta Date of Encounter: 03/09/2012  Principal Problem:  *Precordial pain Active Problems:  Dizziness  Anemia  Morbid obesity with BMI of 50.0-59.9, adult    SUBJECTIVE: No chest pain or dyspnea   OBJECTIVE Filed Vitals:   03/08/12 1515 03/08/12 1545 03/08/12 2007 03/09/12 0538  BP: 165/86  148/71 137/70  Pulse: 58  58 59  Temp: 98.7 F (37.1 C)  98.9 F (37.2 C) 97.9 F (36.6 C)  TempSrc: Oral  Oral Oral  Resp: 18  19 16   Height:      Weight:      SpO2: 98% 97% 98% 97%    Intake/Output Summary (Last 24 hours) at 03/09/12 1010 Last data filed at 03/09/12 0500  Gross per 24 hour  Intake    360 ml  Output      2 ml  Net    358 ml   Filed Weights   03/06/12 0153  Weight: 298 lb 15.1 oz (135.6 kg)     PHYSICAL EXAM General: Well developed, morbidly obese female in no acute distress. Head: Normocephalic, atraumatic.  Neck: Supple  Lungs:  CTA bilaterally. Heart: RRR Abdomen: Soft, non-tender, non-distended Extremities: No clubbing, cyanosis, no edema. Radial cath site without hematoma or bruit Neuro: Alert and oriented X 3. Moves all extremities spontaneously.   LABS: CBC:  Basename 03/08/12 0620 03/07/12 0555  WBC 4.1 4.3  NEUTROABS -- 2.3  HGB 10.6* 10.6*  HCT 32.9* 32.8*  MCV 75.8* 76.8*  PLT 308 289   INR:  Basename 03/08/12 0620  INR 1.07   Basic Metabolic Panel:  Basename 03/08/12 0620  NA 139  K 3.8  CL 101  CO2 32  GLUCOSE 105*  BUN 10  CREATININE 0.74  CALCIUM 9.2  MG --  PHOS --   Cardiac Enzymes:  Basename 03/06/12 1659 03/06/12 1057  CKTOTAL 284* 293*  CKMB 4.3* 4.4*  CKMBINDEX -- --  TROPONINI <0.30 <0.30   BNP: Pro B Natriuretic peptide (BNP)  Date/Time Value Range Status  03/05/2012  9:22 PM 20.7  0 - 125 pg/mL Final  03/11/2009  2:50 AM <30.0  0.0 - 100.0 pg/mL Final    TELE:  SR      Radiology/Studies: Dg Chest 2 View 03/05/2012  *RADIOLOGY REPORT*  Clinical Data: 51 year old  female with chest pain shortness of breath.  CHEST - 2 VIEW  Comparison: 09/13/2011 and earlier.  Findings: Cardiac size at the upper limits of normal. Other mediastinal contours are within normal limits.  Visualized tracheal air column is within normal limits.  No pneumothorax, pulmonary edema, pleural effusion or acute pulmonary opacity.  IMPRESSION: No acute cardiopulmonary abnormality.   Original Report Authenticated By: Harley Hallmark, M.D.    Nm Myocar Multi W/spect W/wall Motion / Ef 03/07/2012  *RADIOLOGY REPORT*  Clinical Data:  Chest pain  MYOCARDIAL IMAGING WITH SPECT (REST AND PHARMACOLOGIC-STRESS - 2 DAY PROTOCOL) GATED LEFT VENTRICULAR WALL MOTION STUDY LEFT VENTRICULAR EJECTION FRACTION  Technique:  Standard myocardial SPECT imaging was performed after intravenous injection of 30 mCi Tc-11m tetrofosmin at rest.  On a different day, intravenous infusion of  Lexiscan was performed under supervision of the Cardiology staff.  At peak effect of the drug, 30 mCi Tc-72m tetrofosmin was injected intravenously and standard myocardial SPECT imaging was performed.  Quantitative gated imaging was also performed to evaluate left ventricular wall motion and estimate left ventricular ejection fraction.  Comparison:  None  Findings: SPECT imaging demonstrates moderate area  of reversibility in the inferoapical region concerning for ischemia.  No fixed defect.  Quantitative gated analysis shows normal wall motion.  The resting left ventricular ejection fraction is 59% with end- diastolic volume of 120 ml and end-systolic volume of 49 ml.  IMPRESSION: Inferoapical reversibility concerning for ischemia.  Ejection fraction 59%.   Original Report Authenticated By: Cyndie Chime, M.D.     ASSESSMENT AND PLAN: Principal Problem:  *Precordial pain - abnormal stress test, cath results noted; plan medical therapy. Continue ASA, norvasc and add pravachol 40 mg po daily; check lipids and liver in six weeks. DDimer mildly  elevated; check CT to R/O pulmonary embolus.  Active Problems:  Dizziness - resolved  Anemia - Most likely related to menstruation; add FeSo4; FU with her primary care.  Morbid obesity - encourage heart-healthy diet.  Hypertension - continue norvasc and fu as outpatient  Plan - d/c if CT neg and FU with Dr Antoine Poche as outpatient. Complete echo after DC  Signed, Olga Millers , MD 10:10 AM 03/09/2012

## 2012-03-09 NOTE — Discharge Summary (Signed)
Patient ID: Hannah Huerta,  MRN: [id number], DOB/AGE: September 09, 1960 51 y.o.  Admit date: 03/05/2012 Discharge date: 03/09/2012  Primary Care Provider: No primary provider on file. Primary Cardiologist: J. Hochrein, MD  Discharge Diagnoses Principal Problem:  *Precordial pain  **s/p Myoview this admission revealing inferoapical ischemia, EF 59%  **s/p Cath this admission revealing nonobs CAD  **s/p CTA Chest this admission revealing No PE.  Active Problems:  HTN (hypertension)  Hyperlipidemia  Dizziness  Anemia  Morbid obesity with BMI of 50.0-59.9, adult  **Needs outpt sleep eval.  Allergies No Known Allergies  Procedures  Lexiscan Myoview (2 Day) 8/21-8/22/2013  IMPRESSION: Inferoapical reversibility concerning for ischemia.  Ejection fraction 59%. _____________  Cardiac Catheterization 03/08/2012  Procedural Findings: Hemodynamics: AO 153/89 (115) LV 158/25    Coronary angiography: Coronary dominance: right  Left mainstem: No significant obstruction Left anterior descending (LAD): The LAD wraps the apex and is a large caliber vessel.  There is some mid vessel calcium and mild irregularity, but there is extensive calcification of a smaller diagonal 2 branch.  The diagonal is moderate proximally with 60% ostial narrowing, and then tapers rapidly to a tiny vessel.  The small bifurcation branch probably has significant proximal narrowing, but is less than 1.75mm in size, and the distribution is small.  The distal LAD wraps the apex       Left circumflex (LCx): provides two large marginal branches and supplies the lateral apex.  These are free of disease.   Right coronary artery (RCA): Provides a single PDA and small PLA.  Smooth and without disease  Left ventriculography: Left ventricular systolic function is normal, LVEF is estimated at 55-65%, there is no significant mitral regurgitation   Final Conclusions:   1.  Heavily calcified small D2 with ostial and mid disease   ---  Not suitable for PCI 2.  Preserved LV function 3.  Mild irregularities of the LAD _____________  CT Angiography of the Chest with Contrast 03/09/2012  IMPRESSION: No evidence of pulmonary embolism.  Age advanced coronary atherosclerosis.  Large fat density lesion in the left axilla, favored to reflect a large lipoma, without overt malignant features. _____________  History of Present Illness  51 y/o female without prior history of coronary artery disease. She was in her usual state of health until the morning of admission when she had 2 episodes of dizziness. She had a third episode at home later in the evening described as a sensation as though the room was spinning. She presented to the [facility] where she had no acute findings on EKG. While in the ED, she also reported moderate to severe sharp chest pain under her left breast without radiation. She was evaluated by cardiology and admitted for further evaluation.  Hospital Course  Patient ruled out for MI. She continued to have sharp chest pain under her left breast. Given her risk factors, decision was made to pursue a 2 day Myoview. Final results of the study were available on August 22, and revealed inferoapical reversibility concerning for ischemia with normal LV function. This being the case, decision was made to perform diagnostic catheterization, which was performed on August 23, revealing nonobstructive coronary artery disease as outlined above. Patient tolerated this procedure well. A d-dimer was evaluated post catheterization and was mildly elevated at 0.59. As result, CT angiography of the chest with contrast was performed this morning and showed no evidence of pulmonary embolus. She will be discharged home today in good condition. We have initiated antihypertensive therapy  with improved blood pressure control. Of note, in the setting of morbid obesity with apparent history of snoring, we have recommended outpatient sleep  evaluation.  Discharge Vitals Blood pressure 137/70, pulse 59, temperature 97.9 F (36.6 C), temperature source Oral, resp. rate 16, height 5\' 2"  (1.575 m), weight 298 lb 15.1 oz (135.6 kg), last menstrual period 02/15/2012, SpO2 97.00%.  Filed Weights   03/06/12 0153  Weight: 298 lb 15.1 oz (135.6 kg)   Labs  CBC  Basename 03/08/12 0620 03/07/12 0555  WBC 4.1 4.3  NEUTROABS -- 2.3  HGB 10.6* 10.6*  HCT 32.9* 32.8*  MCV 75.8* 76.8*  PLT 308 289   Basic Metabolic Panel  Basename 03/08/12 0620  NA 139  K 3.8  CL 101  CO2 32  GLUCOSE 105*  BUN 10  CREATININE 0.74  CALCIUM 9.2  MG --  PHOS --   Liver Function Tests  Basename 03/08/12 0855  AST 20  ALT 17  ALKPHOS 78  BILITOT 0.2*  PROT 7.6  ALBUMIN 3.3*   Cardiac Enzymes  Basename 03/06/12 1659  CKTOTAL 284*  CKMB 4.3*  CKMBINDEX --  TROPONINI <0.30   D-Dimer  Basename 03/08/12 1214  DDIMER 0.59*   Fasting Lipid Panel  Basename 03/08/12 0855  CHOL 180  HDL 57  LDLCALC 103*  TRIG 98  CHOLHDL 3.2  LDLDIRECT --   Disposition  Pt is being discharged home today in good condition.  Follow-up Plans & Appointments  Follow-up Information    Follow up with Rollene Rotunda, MD. (2-4 wks.  We will arrange.)    Contact information:   [address] [place] [address] [phone]         Discharge Medications  Medication List  As of 03/09/2012  1:26 PM   TAKE these medications         amLODipine 5 MG tablet   Commonly known as: NORVASC   Take 1 tablet (5 mg total) by mouth daily.      aspirin 81 MG chewable tablet   Chew 1 tablet (81 mg total) by mouth every morning.      multivitamins with iron Tabs   Take 1 tablet by mouth daily.      nitroGLYCERIN 0.4 MG SL tablet   Commonly known as: NITROSTAT   Place 1 tablet (0.4 mg total) under the tongue every 5 (five) minutes as needed for chest pain.      pravastatin 40 MG tablet    Commonly known as: PRAVACHOL   Take 1 tablet (40 mg total) by mouth daily.           Outstanding Labs/Studies  F/u Lipids/LFT's in 6-8 wks.  Duration of Discharge Encounter   Greater than 30 minutes including physician time.  Signed, Nicolasa Ducking NP 03/09/2012, 1:26 PM

## 2012-03-10 NOTE — Discharge Summary (Signed)
See progress notes Brian Crenshaw  

## 2012-03-12 ENCOUNTER — Other Ambulatory Visit: Payer: Self-pay | Admitting: Family Medicine

## 2012-03-12 DIAGNOSIS — Z1231 Encounter for screening mammogram for malignant neoplasm of breast: Secondary | ICD-10-CM

## 2012-03-13 ENCOUNTER — Emergency Department (HOSPITAL_COMMUNITY): Payer: BC Managed Care – PPO

## 2012-03-13 ENCOUNTER — Encounter (HOSPITAL_COMMUNITY): Payer: Self-pay | Admitting: Family Medicine

## 2012-03-13 ENCOUNTER — Emergency Department (HOSPITAL_COMMUNITY)
Admission: EM | Admit: 2012-03-13 | Discharge: 2012-03-13 | Disposition: A | Discharge status: Home / Self Care | Payer: BC Managed Care – PPO | Attending: Emergency Medicine | Admitting: Emergency Medicine

## 2012-03-13 DIAGNOSIS — Z6841 Body Mass Index (BMI) 40.0 and over, adult: Secondary | ICD-10-CM | POA: Insufficient documentation

## 2012-03-13 DIAGNOSIS — K219 Gastro-esophageal reflux disease without esophagitis: Secondary | ICD-10-CM | POA: Insufficient documentation

## 2012-03-13 DIAGNOSIS — I10 Essential (primary) hypertension: Secondary | ICD-10-CM | POA: Insufficient documentation

## 2012-03-13 DIAGNOSIS — D649 Anemia, unspecified: Secondary | ICD-10-CM | POA: Insufficient documentation

## 2012-03-13 DIAGNOSIS — D573 Sickle-cell trait: Secondary | ICD-10-CM | POA: Insufficient documentation

## 2012-03-13 DIAGNOSIS — R0789 Other chest pain: Secondary | ICD-10-CM

## 2012-03-13 LAB — COMPREHENSIVE METABOLIC PANEL
ALT: 16 U/L (ref 0–35)
AST: 30 U/L (ref 0–37)
Albumin: 3.5 g/dL (ref 3.5–5.2)
Alkaline Phosphatase: 93 U/L (ref 39–117)
BUN: 13 mg/dL (ref 6–23)
CO2: 30 mEq/L (ref 19–32)
Calcium: 9.8 mg/dL (ref 8.4–10.5)
Chloride: 101 mEq/L (ref 96–112)
Creatinine, Ser: 0.97 mg/dL (ref 0.50–1.10)
GFR calc Af Amer: 78 mL/min — ABNORMAL LOW (ref >90)
GFR calc non Af Amer: 67 mL/min — ABNORMAL LOW (ref >90)
Glucose, Bld: 100 mg/dL — ABNORMAL HIGH (ref 70–99)
Potassium: 4 mEq/L (ref 3.5–5.1)
Sodium: 139 mEq/L (ref 135–145)
Total Bilirubin: 0.2 mg/dL — ABNORMAL LOW (ref 0.3–1.2)
Total Protein: 8.3 g/dL (ref 6.0–8.3)

## 2012-03-13 LAB — POCT I-STAT TROPONIN I
Troponin i, poc: 0.01 ng/mL (ref 0.00–0.08)
Troponin i, poc: 0.02 ng/mL (ref 0.00–0.08)

## 2012-03-13 LAB — CBC
HCT: 34.9 % — ABNORMAL LOW (ref 36.0–46.0)
Hemoglobin: 11.3 g/dL — ABNORMAL LOW (ref 12.0–15.0)
MCH: 24.4 pg — ABNORMAL LOW (ref 26.0–34.0)
MCHC: 32.4 g/dL (ref 30.0–36.0)
MCV: 75.2 fL — ABNORMAL LOW (ref 78.0–100.0)
Platelets: 337 10*3/uL (ref 150–400)
RBC: 4.64 MIL/uL (ref 3.87–5.11)
RDW: 15.2 % (ref 11.5–15.5)
WBC: 4.6 10*3/uL (ref 4.0–10.5)

## 2012-03-13 MED ORDER — GI COCKTAIL ~~LOC~~
30.0000 mL | Freq: Once | ORAL | Status: AC
  Administered 2012-03-13: 30 mL via ORAL
  Filled 2012-03-13: qty 30

## 2012-03-13 MED ORDER — ASPIRIN EC 325 MG PO TBEC
325.0000 mg | DELAYED_RELEASE_TABLET | Freq: Every day | ORAL | Status: DC
  Administered 2012-03-13: 325 mg via ORAL
  Filled 2012-03-13: qty 1

## 2012-03-13 MED ORDER — OMEPRAZOLE 20 MG PO CPDR: 20.0000 mg | DELAYED_RELEASE_CAPSULE | Freq: Every day | ORAL | Status: DC

## 2012-03-13 NOTE — ED Notes (Signed)
Per pt sts left sided chest pain intermittent since this am. Took nitro without relief. Associated with nausea and SOB.

## 2012-03-13 NOTE — ED Provider Notes (Signed)
I saw and evaluated the patient, reviewed the resident's note and I agree with the findings and plan. I saw and evaluated the EKG and agree with the resident's interpretation. Patient with chest pain and shortness of breath that seems to be worse in the morning and after eating. Patient was recently admitted and had an extensive workup including a cardiac cath, CTA of the chest which was normal except for a 60% lesion in her OM. Patient's serial troponins, chest x-ray and EKG are all unchanged. Pain greatly improved after GI cocktail and patient will be started on omeprazole.   Gwyneth Sprout, MD 03/13/12 [phone]

## 2012-03-13 NOTE — ED Provider Notes (Signed)
History     CSN: [id number]  Arrival date & time 03/13/12  1545   First MD Initiated Contact with Patient 03/13/12 2041      Chief Complaint  Patient presents with  . Chest Pain    HPI 51 y/o woman newly diagnosed with CAD, presenting with chest pain.  Pt was d/c from hospital 4 days ago for a chest pain admission.  Myoview showed some inferior inducible ischemia and cardiac cath showed 60% ostial narrowing of D2.  Was d/c with nitro PRN, pravastatin, amlodipine, and Asprin.  She has not started taking ASA yet.   Every day since discharge she has experienced some chest pain.  The pain was responsive to nitro at first, but today, it has not.  She woke up this AM with a pressure in her chest.  Exertion of any amount worsens it, and eating causes a sharp pain.  When she swallows, it is without pain, but then the food feels like it gets hung up and then a sharp pain follows.  She is also endorsing some nausea, headache, dizziness, and shortness of breath.  She denies abdominal pain and dysuria.  Past Medical History  Diagnosis Date  . Hypertension   . GERD (gastroesophageal reflux disease)   . Anemia   . Sickle cell trait   . Morbid obesity with BMI of 50.0-59.9, adult   . Chest pain     a. 02/2012 abnl myoview - inferoapical reverisbility concerning for ischemia, EF 59%;   02/2012 nonobstructive cath    Past Surgical History  Procedure Date  . Cholecystectomy   . Tubal ligation   . Spine surgery     fusion    Family History  Problem Relation Age of Onset  . Coronary artery disease Father 51  . Sudden death Mother 9    Questionable MI    History  Substance Use Topics  . Smoking status: Never Smoker   . Smokeless tobacco: Not on file  . Alcohol Use: No    Review of Systems  All other systems reviewed and are negative.    Allergies  Review of patient's allergies indicates no known allergies.  Home Medications   Current Outpatient Rx  Name Route Sig Dispense Refill    . AMLODIPINE BESYLATE 5 MG PO TABS Oral Take 1 tablet (5 mg total) by mouth daily. 30 tablet 6  . ASPIRIN 81 MG PO CHEW Oral Chew 1 tablet (81 mg total) by mouth every morning.    . TAB-A-VITE/IRON PO TABS Oral Take 1 tablet by mouth daily.    [name] NITROGLYCERIN 0.4 MG SL SUBL Sublingual Place 1 tablet (0.4 mg total) under the tongue every 5 (five) minutes as needed for chest pain. 25 tablet 3  . PRAVASTATIN SODIUM 40 MG PO TABS Oral Take 1 tablet (40 mg total) by mouth daily. 30 tablet 6    BP 157/79  Pulse 65  Temp 97.9 F (36.6 C) (Oral)  Resp 18  SpO2 100%  LMP 02/15/2012  Physical Exam  Constitutional: She appears well-developed and well-nourished. No distress.  Cardiovascular: Normal rate and regular rhythm.   Murmur heard.  Systolic (loudest over upper sternal borders) murmur is present with a grade of 2/6  Pulmonary/Chest: Effort normal and breath sounds normal. She exhibits no crepitus, no deformity and no swelling.    Abdominal: Soft. Bowel sounds are normal. She exhibits no mass. There is no tenderness.  Skin: Skin is warm, dry and intact.    ED  Course  Procedures (including critical care time)  10:28 PM - Pt reassessed.  Pain improved after GI Cocktail.  Spoke with cardiologist Dr. Charm Barges.  He is okay with discharge.  Labs Reviewed  CBC - Abnormal; Notable for the following:    Hemoglobin 11.3 (*)     HCT 34.9 (*)     MCV 75.2 (*)     MCH 24.4 (*)     All other components within normal limits  COMPREHENSIVE METABOLIC PANEL - Abnormal; Notable for the following:    Glucose, Bld 100 (*)     Total Bilirubin 0.2 (*)     GFR calc non Af Amer 67 (*)     GFR calc Af Amer 78 (*)     All other components within normal limits  POCT I-STAT TROPONIN I  POCT I-STAT TROPONIN I   Dg Chest 2 View  03/13/2012  *RADIOLOGY REPORT*  Clinical Data: Chest pain and chest pressure for 3-4 days.  History of cardiac catheterization 1 week ago.  Sickle cell disease.  CHEST - 2 VIEW   Comparison: 03/05/2012.  Findings:  Cardiopericardial silhouette within normal limits. Mediastinal contours normal. Trachea midline.  No airspace disease or effusion.  IMPRESSION: No active cardiopulmonary disease.   Original Report Authenticated By: Andreas Newport, M.D.    EKG Results: Rate:  78 PR:  152 QRS:  84 QTc:  444 EKG: normal EKG, normal sinus rhythm, unchanged from previous tracings.    1. Atypical chest pain       MDM  1.   Chest pain:  Negative troponins and normal EKG rule out cardiac ischemia.  CTA of chest 4 days ago was normal.  History suggests GERD with pain following eating and response initially to nitro and with GI cocktail here.  Pain with palpation of chest wall is also concerning for costochondritis or other MSK etiologies.  We will discharge patient with instructions to begin omeprazole 20mg  daily and follow up with her PCP.  Lollie Sails, MD 03/13/12 1610  Lollie Sails, MD 03/13/12 2242

## 2012-03-14 ENCOUNTER — Ambulatory Visit
Admission: RE | Admit: 2012-03-14 | Discharge: 2012-03-14 | Disposition: A | Discharge status: Home / Self Care | Payer: BC Managed Care – PPO | Source: Ambulatory Visit | Attending: Family Medicine | Admitting: Family Medicine

## 2012-03-14 DIAGNOSIS — Z1231 Encounter for screening mammogram for malignant neoplasm of breast: Secondary | ICD-10-CM

## 2012-03-15 ENCOUNTER — Ambulatory Visit: Payer: BC Managed Care – PPO

## 2012-03-25 ENCOUNTER — Encounter (HOSPITAL_COMMUNITY): Payer: Self-pay | Admitting: *Deleted

## 2012-03-25 ENCOUNTER — Emergency Department (HOSPITAL_COMMUNITY)
Admission: EM | Admit: 2012-03-25 | Discharge: 2012-03-25 | Disposition: A | Discharge status: Home / Self Care | Payer: BC Managed Care – PPO | Attending: Emergency Medicine | Admitting: Emergency Medicine

## 2012-03-25 ENCOUNTER — Emergency Department (HOSPITAL_COMMUNITY): Payer: BC Managed Care – PPO

## 2012-03-25 DIAGNOSIS — Z6841 Body Mass Index (BMI) 40.0 and over, adult: Secondary | ICD-10-CM | POA: Insufficient documentation

## 2012-03-25 DIAGNOSIS — K219 Gastro-esophageal reflux disease without esophagitis: Secondary | ICD-10-CM | POA: Insufficient documentation

## 2012-03-25 DIAGNOSIS — Z79899 Other long term (current) drug therapy: Secondary | ICD-10-CM | POA: Insufficient documentation

## 2012-03-25 DIAGNOSIS — R0602 Shortness of breath: Secondary | ICD-10-CM | POA: Insufficient documentation

## 2012-03-25 DIAGNOSIS — R110 Nausea: Secondary | ICD-10-CM | POA: Insufficient documentation

## 2012-03-25 DIAGNOSIS — R42 Dizziness and giddiness: Secondary | ICD-10-CM | POA: Insufficient documentation

## 2012-03-25 DIAGNOSIS — R059 Cough, unspecified: Secondary | ICD-10-CM | POA: Insufficient documentation

## 2012-03-25 DIAGNOSIS — R079 Chest pain, unspecified: Secondary | ICD-10-CM | POA: Insufficient documentation

## 2012-03-25 DIAGNOSIS — R05 Cough: Secondary | ICD-10-CM | POA: Insufficient documentation

## 2012-03-25 DIAGNOSIS — I10 Essential (primary) hypertension: Secondary | ICD-10-CM | POA: Insufficient documentation

## 2012-03-25 DIAGNOSIS — D573 Sickle-cell trait: Secondary | ICD-10-CM | POA: Insufficient documentation

## 2012-03-25 LAB — COMPREHENSIVE METABOLIC PANEL
ALT: 11 U/L (ref 0–35)
AST: 16 U/L (ref 0–37)
Albumin: 3.3 g/dL — ABNORMAL LOW (ref 3.5–5.2)
Alkaline Phosphatase: 71 U/L (ref 39–117)
BUN: 11 mg/dL (ref 6–23)
CO2: 32 mEq/L (ref 19–32)
Calcium: 9.2 mg/dL (ref 8.4–10.5)
Chloride: 101 mEq/L (ref 96–112)
Creatinine, Ser: 0.78 mg/dL (ref 0.50–1.10)
GFR calc Af Amer: >90 mL/min (ref >90)
GFR calc non Af Amer: >90 mL/min (ref >90)
Glucose, Bld: 101 mg/dL — ABNORMAL HIGH (ref 70–99)
Potassium: 3.8 mEq/L (ref 3.5–5.1)
Sodium: 139 mEq/L (ref 135–145)
Total Bilirubin: 0.1 mg/dL — ABNORMAL LOW (ref 0.3–1.2)
Total Protein: 7.4 g/dL (ref 6.0–8.3)

## 2012-03-25 LAB — CBC WITH DIFFERENTIAL/PLATELET
Basophils Absolute: 0 10*3/uL (ref 0.0–0.1)
Basophils Relative: 0 % (ref 0–1)
Eosinophils Absolute: 0.1 10*3/uL (ref 0.0–0.7)
Eosinophils Relative: 4 % (ref 0–5)
HCT: 32 % — ABNORMAL LOW (ref 36.0–46.0)
Hemoglobin: 10.5 g/dL — ABNORMAL LOW (ref 12.0–15.0)
Lymphocytes Relative: 26 % (ref 12–46)
Lymphs Abs: 0.9 10*3/uL (ref 0.7–4.0)
MCH: 25 pg — ABNORMAL LOW (ref 26.0–34.0)
MCHC: 32.8 g/dL (ref 30.0–36.0)
MCV: 76.2 fL — ABNORMAL LOW (ref 78.0–100.0)
Monocytes Absolute: 0.3 10*3/uL (ref 0.1–1.0)
Monocytes Relative: 9 % (ref 3–12)
Neutro Abs: 2.2 10*3/uL (ref 1.7–7.7)
Neutrophils Relative %: 62 % (ref 43–77)
Platelets: 296 10*3/uL (ref 150–400)
RBC: 4.2 MIL/uL (ref 3.87–5.11)
RDW: 15.1 % (ref 11.5–15.5)
WBC: 3.6 10*3/uL — ABNORMAL LOW (ref 4.0–10.5)

## 2012-03-25 LAB — POCT I-STAT TROPONIN I: Troponin i, poc: 0 ng/mL (ref 0.00–0.08)

## 2012-03-25 LAB — LIPASE, BLOOD: Lipase: 29 U/L (ref 11–59)

## 2012-03-25 LAB — TSH: TSH: 1.893 u[IU]/mL (ref 0.350–4.500)

## 2012-03-25 MED ORDER — MORPHINE SULFATE 4 MG/ML IJ SOLN
4.0000 mg | Freq: Once | INTRAMUSCULAR | Status: AC
  Administered 2012-03-25: 4 mg via INTRAVENOUS
  Filled 2012-03-25: qty 1

## 2012-03-25 MED ORDER — ONDANSETRON HCL 4 MG/2ML IJ SOLN
4.0000 mg | Freq: Once | INTRAMUSCULAR | Status: AC
  Administered 2012-03-25: 4 mg via INTRAVENOUS
  Filled 2012-03-25: qty 2

## 2012-03-25 MED ORDER — NITROGLYCERIN 0.4 MG SL SUBL
0.4000 mg | SUBLINGUAL_TABLET | SUBLINGUAL | Status: DC | PRN
  Administered 2012-03-25 (×3): 0.4 mg via SUBLINGUAL
  Filled 2012-03-25: qty 25

## 2012-03-25 NOTE — ED Provider Notes (Signed)
History     CSN: [id number]  Arrival date & time 03/25/12  4098   First MD Initiated Contact with Patient 03/25/12 [phone]      Chief Complaint  Patient presents with  . Chest Pain    (Consider location/radiation/quality/duration/timing/severity/associated sxs/prior treatment) Patient is a 51 y.o. female presenting with chest pain. The history is provided by the patient.  Chest Pain The chest pain began 1 - 2 hours ago. Chest pain occurs constantly. The chest pain is unchanged. At its most intense, the pain is at 9/10. The pain is currently at 9/10. The severity of the pain is moderate. The quality of the pain is described as pressure-like. The pain radiates to the epigastrium. Primary symptoms include fatigue, shortness of breath, cough, abdominal pain, nausea and dizziness. Pertinent negatives for primary symptoms include no fever, no wheezing, no palpitations and no vomiting.  Dizziness also occurs with nausea and diaphoresis. Dizziness does not occur with vomiting or weakness.   Associated symptoms include diaphoresis and numbness.  Pertinent negatives for associated symptoms include no weakness.   Pt states she noted pain while getting ready for work. Pt states when she got to work, pain continued, she states she became dizzy, diaphoretic, nauseated. States took 1 SL Nitro with mild relief. States now pain still 9/10. Points to the left upper abdomen, lower chest. Recent admission for the same, with cardiac cath which showed non obstructive CAD. Has not followed up with cardiology yet.   Past Medical History  Diagnosis Date  . Hypertension   . GERD (gastroesophageal reflux disease)   . Anemia   . Sickle cell trait   . Morbid obesity with BMI of 50.0-59.9, adult   . Chest pain     a. 02/2012 abnl myoview - inferoapical reverisbility concerning for ischemia, EF 59%;   02/2012 nonobstructive cath    Past Surgical History  Procedure Date  . Cholecystectomy   . Tubal ligation   .  Spine surgery     fusion    Family History  Problem Relation Age of Onset  . Coronary artery disease Father 56  . Sudden death Mother 39    Questionable MI    History  Substance Use Topics  . Smoking status: Never Smoker   . Smokeless tobacco: Not on file  . Alcohol Use: No    OB History    Grav Para Term Preterm Abortions TAB SAB Ect Mult Living                  Review of Systems  Constitutional: Positive for diaphoresis and fatigue. Negative for fever and chills.  HENT: Negative for neck pain and neck stiffness.   Eyes: Negative for visual disturbance.  Respiratory: Positive for cough, chest tightness and shortness of breath. Negative for wheezing.   Cardiovascular: Positive for chest pain. Negative for palpitations.  Gastrointestinal: Positive for nausea and abdominal pain. Negative for vomiting.  Genitourinary: Negative for dysuria.  Musculoskeletal: Negative.   Skin: Negative.   Neurological: Positive for dizziness, light-headedness, numbness and headaches. Negative for weakness.  Psychiatric/Behavioral: Negative.     Allergies  Review of patient's allergies indicates no known allergies.  Home Medications   Current Outpatient Rx  Name Route Sig Dispense Refill  . AMLODIPINE BESYLATE 5 MG PO TABS Oral Take 1 tablet (5 mg total) by mouth daily. 30 tablet 6  . ASPIRIN 81 MG PO CHEW Oral Chew 1 tablet (81 mg total) by mouth every morning.    [name]  NITROGLYCERIN 0.4 MG SL SUBL Sublingual Place 1 tablet (0.4 mg total) under the tongue every 5 (five) minutes as needed for chest pain. 25 tablet 3  . OMEPRAZOLE 20 MG PO CPDR Oral Take 1 capsule (20 mg total) by mouth daily. 30 capsule 0  . PRAVASTATIN SODIUM 40 MG PO TABS Oral Take 1 tablet (40 mg total) by mouth daily. 30 tablet 6    BP 136/110  Pulse 86  Temp 98.6 F (37 C) (Oral)  Resp 18  SpO2 95%  LMP 02/15/2012  Physical Exam  Nursing note and vitals reviewed. Constitutional: She is oriented to person,  place, and time. She appears well-developed and well-nourished. No distress.       Morbidly obese  HENT:  Head: Normocephalic.  Eyes: Conjunctivae are normal.  Neck: Neck supple.  Cardiovascular: Normal rate, regular rhythm and normal heart sounds.   Pulmonary/Chest: Effort normal and breath sounds normal. No respiratory distress. She has no wheezes. She has no rales.       Tender to palpation over left lower chest, under the breast.   Abdominal: Soft. Bowel sounds are normal. She exhibits no distension. There is tenderness. There is no rebound and no guarding.       LUQ tenderness  Musculoskeletal: She exhibits no edema.  Neurological: She is alert and oriented to person, place, and time.  Skin: Skin is warm and dry.  Psychiatric: She has a normal mood and affect.    ED Course  Procedures (including critical care time)   Date: 03/25/2012  Rate: 76  Rhythm: normal sinus rhythm  QRS Axis: normal  Intervals: normal  ST/T Wave abnormalities: normal  Conduction Disutrbances:none  Narrative Interpretation:   Old EKG Reviewed: unchanged   Results for orders placed during the hospital encounter of 03/25/12  CBC WITH DIFFERENTIAL      Component Value Range   WBC 3.6 (*) 4.0 - 10.5 K/uL   RBC 4.20  3.87 - 5.11 MIL/uL   Hemoglobin 10.5 (*) 12.0 - 15.0 g/dL   HCT 16.1 (*) 09.6 - 04.5 %   MCV 76.2 (*) 78.0 - 100.0 fL   MCH 25.0 (*) 26.0 - 34.0 pg   MCHC 32.8  30.0 - 36.0 g/dL   RDW 40.9  81.1 - 91.4 %   Platelets 296  150 - 400 K/uL   Neutrophils Relative 62  43 - 77 %   Neutro Abs 2.2  1.7 - 7.7 K/uL   Lymphocytes Relative 26  12 - 46 %   Lymphs Abs 0.9  0.7 - 4.0 K/uL   Monocytes Relative 9  3 - 12 %   Monocytes Absolute 0.3  0.1 - 1.0 K/uL   Eosinophils Relative 4  0 - 5 %   Eosinophils Absolute 0.1  0.0 - 0.7 K/uL   Basophils Relative 0  0 - 1 %   Basophils Absolute 0.0  0.0 - 0.1 K/uL  COMPREHENSIVE METABOLIC PANEL      Component Value Range   Sodium 139  135 - 145  mEq/L   Potassium 3.8  3.5 - 5.1 mEq/L   Chloride 101  96 - 112 mEq/L   CO2 32  19 - 32 mEq/L   Glucose, Bld 101 (*) 70 - 99 mg/dL   BUN 11  6 - 23 mg/dL   Creatinine, Ser 7.82  0.50 - 1.10 mg/dL   Calcium 9.2  8.4 - 95.6 mg/dL   Total Protein 7.4  6.0 - 8.3 g/dL  Albumin 3.3 (*) 3.5 - 5.2 g/dL   AST 16  0 - 37 U/L   ALT 11  0 - 35 U/L   Alkaline Phosphatase 71  39 - 117 U/L   Total Bilirubin 0.1 (*) 0.3 - 1.2 mg/dL   GFR calc non Af Amer >90  >90 mL/min   GFR calc Af Amer >90  >90 mL/min  LIPASE, BLOOD      Component Value Range   Lipase 29  11 - 59 U/L  POCT I-STAT TROPONIN I      Component Value Range   Troponin i, poc 0.00  0.00 - 0.08 ng/mL   Comment 3            Dg Chest 2 View  03/25/2012  *RADIOLOGY REPORT*  Clinical Data: Chest pain.  Short of breath.  Cough.  CHEST - 2 VIEW  Comparison: 03/13/2012  Findings: Heart size is normal.  Mediastinum is normal except for slight unfolding of the aorta.  The vascularity is normal.  Lungs are clear.  No effusions.  Ordinary degenerative changes effect the spine.  IMPRESSION: No active disease   Original Report Authenticated By: Thomasenia Sales, M.D.        1. Chest pain       MDM  Pt with CP, nausea, dizziness. Recent Cardiac cath last month, showing non obstructive CAD. Pt's troponin, labs are negative here. CXR normal. Cardiology was consulted and they saw pt in ED. OK to d/c home with outpatient follow up. Pt to continue current medications. Pt has appointment with her PCp tomorrow.         Lottie Mussel, PA 03/25/12 1550  Lottie Mussel, PA 03/25/12 1551

## 2012-03-25 NOTE — ED Notes (Signed)
Pt states hat she was getting ready for work an hour ago and started having left sided chest pain. Pt states nausea, pt denies SOB. Pt states she was sweaty when the pain started, pt states that the pain states center chest. Pt states pain increases with deep breath and she feel dizzy

## 2012-03-25 NOTE — ED Notes (Signed)
Pt states 6/10 L sided chest pain after receiving (3) sublingual nitroglycerin tablets. Remains on cardiac monitor. Vital signs stable. No signs of distress noted at the time.

## 2012-03-25 NOTE — ED Notes (Signed)
Pt presents to department for evaluation of L sided chest pain. Onset this morning while getting ready for work. Also states she broke out into a sweat and is nauseated. 7/10 pain at the time, describes as pressure sensation. Denies SOB. Respirations unlabored at the time. Pt is conscious alert and oriented x4. Also states she has headache at the time.

## 2012-03-25 NOTE — Consult Note (Signed)
Consult Note  Patient ID: Hannah Huerta MRN: [id number], SOB: December 16, 1960 51 y.o. Date of Encounter: 03/25/2012, 10:20 AM  Primary Physician: Geraldo Pitter, MD Primary Cardiologist: J. Hochrein, MD  Chief Complaint: chest pain  HPI: 51 y.o. female w/ PMHx significant for Nonobstructive CAD by cath 02/2012, HTN, HLD, Morbid Obesity, and GERD who presented to [facility] on 03/25/2012 with complaints of chest pain.  She was evaluated last month for complaints of chest pain at which time CTA was negative for PE, but myoview was abnormal so she underwent cardiac cath revealing nonobstructive CAD. She was initiated on antihypertensives and statin with plans for follow up with her PCP and Dr. Antoine Poche. She returned to the ED today due to sharp left sided chest pain that occurred while at work today. The pain is associated with sob and nausea. She has had chest pain intermittently for ~ 55yr. Mostly occurs with stress. Made better if she relaxes. Occurs at rest and with exertion. Occasional radiation to neck and left shoulder. Lasts 15-16minutes. Not pleuritic. Not associated with food. Relieved in the ED with Nitro and morphine.EKG shows NSR with no acute ST/T changes. CXR is without acute cardiopulmonary abnormalities. Labs are significant for normal poc troponin, normal Lipase, stable anemia, unremarkable CMET.   Past Medical History  Diagnosis Date  . Hypertension   . GERD (gastroesophageal reflux disease)   . Anemia   . Sickle cell trait   . Morbid obesity with BMI of 50.0-59.9, adult   . Chest pain     a. 02/2012 abnl myoview - inferoapical reverisbility concerning for ischemia, EF 59%;   02/2012 nonobstructive cath    03/07/2012 - Myoview Findings: SPECT imaging demonstrates moderate area of reversibility in the inferoapical region concerning for ischemia.  No fixed defect.  Quantitative gated analysis shows normal wall motion.  The resting left ventricular ejection fraction is 59% with  end- diastolic volume of 120 ml and end-systolic volume of 49 ml.  IMPRESSION: Inferoapical reversibility concerning for ischemia.  Ejection fraction 59%.     03/08/2012 - Cardiac Catheterization  Procedural Findings:  Hemodynamics:  AO 153/89 (115)  LV 158/25  Coronary angiography:  Coronary dominance: right  Left mainstem: No significant obstruction  Left anterior descending (LAD): The LAD wraps the apex and is a large caliber vessel. There is some mid vessel calcium and mild irregularity, but there is extensive calcification of a smaller diagonal 2 branch. The diagonal is moderate proximally with 60% ostial narrowing, and then tapers rapidly to a tiny vessel. The small bifurcation branch probably has significant proximal narrowing, but is less than 1.51mm in size, and the distribution is small. The distal LAD wraps the apex  Left circumflex (LCx): provides two large marginal branches and supplies the lateral apex. These are free of disease.  Right coronary artery (RCA): Provides a single PDA and small PLA. Smooth and without disease  Left ventriculography: Left ventricular systolic function is normal, LVEF is estimated at 55-65%, there is no significant mitral regurgitation  Final Conclusions:  1. Heavily calcified small D2 with ostial and mid disease --- Not suitable for PCI  2. Preserved LV function  3. Mild irregularities of the LAD  03/09/2012 - CTA Chest Findings: No evidence of pulmonary embolism.  The lungs are essentially clear.  Mild mosaic attenuation.  Minimal dependent atelectasis the left lung base.  No pleural effusion or pneumothorax.  Heart is top normal in size.  No pericardial effusion.  Coronary atherosclerosis.  No  suspicious mediastinal, hilar, or axillary lymphadenopathy.  Large fat density lesion in the left axilla (series 5/image 26), favored to reflect a large lipoma.  No overt malignant features or soft tissue components.  Visualized upper abdomen is unremarkable.   Degenerative changes of the visualized thoracolumbar spine.  IMPRESSION: No evidence of pulmonary embolism.  Age advanced coronary atherosclerosis.  Large fat density lesion in the left axilla, favored to reflect a large lipoma, without overt malignant features. .      Surgical History:  Past Surgical History  Procedure Date  . Cholecystectomy   . Tubal ligation   . Spine surgery     fusion     Home Meds: Medication Sig  amLODipine (NORVASC) 5 MG tablet Take 1 tablet (5 mg total) by mouth daily.  aspirin 81 MG chewable tablet Chew 1 tablet (81 mg total) by mouth every morning.  nitroGLYCERIN (NITROSTAT) 0.4 MG SL tablet Place 1 tablet (0.4 mg total) under the tongue every 5 (five) minutes as needed for chest pain.  omeprazole (PRILOSEC) 20 MG capsule Take 1 capsule (20 mg total) by mouth daily.  pravastatin (PRAVACHOL) 40 MG tablet Take 1 tablet (40 mg total) by mouth daily.    Allergies: No Known Allergies  History   Social History  . Marital Status: Married    Spouse Name: N/A    Number of Children: 3  . Years of Education: N/A   Occupational History  . Works in the [employer]   Social History Main Topics  . Smoking status: Never Smoker   . Smokeless tobacco: Not on file  . Alcohol Use: No  . Drug Use: No  . Sexually Active: Yes    Birth Control/ Protection: Surgical   Other Topics Concern  . Not on file   Social History Narrative   Lives at home with husband and two of her sons.       Family History  Problem Relation Age of Onset  . Coronary artery disease Father 59  . Sudden death Mother 65    Questionable MI    Review of Systems: General: negative for chills, fever, night sweats or weight changes.  Cardiovascular: (+) chest pain, shortness of breath: negative for dyspnea on exertion, edema, orthopnea, palpitations, paroxysmal nocturnal dyspnea  Dermatological: negative for rash Respiratory: negative for cough or wheezing Urologic: negative for  hematuria Abdominal: (+)nausea; negative for vomiting, diarrhea, bright red blood per rectum, melena, or hematemesis Neurologic: negative for visual changes, syncope, or dizziness All other systems reviewed and are otherwise negative except as noted above.  Labs:   Component Value Date   WBC 3.6* 03/25/2012   HGB 10.5* 03/25/2012   HCT 32.0* 03/25/2012   MCV 76.2* 03/25/2012   PLT 296 03/25/2012    Lab 03/25/12 0813  NA 139  K 3.8  CL 101  CO2 32  BUN 11  CREATININE 0.78  CALCIUM 9.2  PROT 7.4  BILITOT 0.1*  ALKPHOS 71  ALT 11  AST 16  GLUCOSE 101*     03/25/2012 08:20  Troponin i, poc 0.00    Radiology/Studies:   03/25/2012 - Chest 2 View Findings: Heart size is normal.  Mediastinum is normal except for slight unfolding of the aorta.  The vascularity is normal.  Lungs are clear.  No effusions.  Ordinary degenerative changes effect the spine.  IMPRESSION: No active disease      EKG: 03/25/12 @ 0650 - NSR 76bpm, No acute ST/T changes  Physical Exam: Blood pressure  115/55, pulse 66, temperature 98.6 F (37 C), temperature source Oral, resp. rate 18, last menstrual period 03/04/2012, SpO2 99.00%. General: Morbidly obese black female, in no acute distress. Head: Normocephalic, atraumatic, sclera non-icteric, nares are without discharge Neck: Supple. Negative for carotid bruits. JVD not elevated. Lungs: Clear bilaterally to auscultation without wheezes, rales, or rhonchi. Breathing is unlabored. Heart: RRR with S1 S2. No murmurs, rubs, or gallops appreciated. Chest: Tender to palpation left lower sternal border Abdomen: Tender to palpation LUQ. Soft, non-tender, non-distended with normoactive bowel sounds. No rebound/guarding. No obvious abdominal masses. Msk:  Strength and tone appear normal for age. Extremities: No edema. No clubbing or cyanosis. Distal pedal pulses are intact and equal bilaterally. Neuro: Alert and oriented X 3. Moves all extremities spontaneously. Psych:   Responds to questions appropriately with a normal affect.    ASSESSMENT AND PLAN:  51 y.o. female w/ PMHx significant for Nonobstructive CAD by cath 02/2012, HTN, HLD, Morbid Obesity, and GERD who presented to [facility] on 03/25/2012 with complaints of chest pain.  See MD note below  Signed, HOPE, JESSICA PA-C 03/25/2012, 10:20 AM  As above, patient seen and examined. Briefly 51 year-old female with past medical history of hypertension and hyperlipidemia for evaluation of chest pain. Patient recently admitted with chest pain and had cardiac catheterization on August 23. There was a 60% ostial diagonal but no other coronary disease noted. Ejection fraction was 55-65%. A CT angiogram of her chest showed no pulmonary embolus on 03/09/2012. Patient presented to the emergency room this morning with complaints of recurrent chest pain. It is under the left breast and occasionally radiates to her upper extremity. It occurs both with exertion and at rest. It is not pleuritic, positional, related to food. There is shortness of breath and nausea. It typically lasts 15 minutes. It is described as a sharp pain. It typically occurs with stress. Her enzymes are negative. Her electrocardiogram shows sinus rhythm with no ST changes. Her chest x-ray shows no acute disease. On exam she has some tenderness to palpation in her chest that she states worsens her pain. Etiology of pain unclear but I do not think this is cardiac. She had an extensive recent cardiac evaluation including cardiac catheterization and CTA as described. I do not think further cardiac evaluation is warranted. Her pain may be either GI or musculoskeletal. Check TSH given her fatigue. She is scheduled to see her primary care doctor tomorrow. She will need further evaluation at that time and also further evaluation of her anemia. She can follow up with Dr. Antoine Poche as scheduled. Would continue preadmission medications after discharge. Please call  with questions. Olga Millers 10:49 AM

## 2012-03-25 NOTE — Progress Notes (Signed)
9:37 AM Morbidly obese woman with appx 2 hour Hx of chest pain.  Exam shows lungs clear, heart sounds normal.  EKG and lab tests are normal.  She had been hospitalized and had cardiac cath in August 2013 by [facility].  Will ask them to see her and advise her.

## 2012-03-26 NOTE — ED Provider Notes (Signed)
Medical screening examination/treatment/procedure(s) were conducted as a shared visit with non-physician practitioner(s) and myself.  I personally evaluated the patient during the encounter 51 yo woman with chest pain.  Lungs clear, heart sounds WNL.  EKG and TNI WNL.  Review of prior charts shows cardiac workup last month with negative cardiac catheterization.  Case discussed with Dr. Jens Som of [facility], who advised that with a negative cath cardiac followup is unnecessary.  She should followup with her PCP, Renaye Rakers, M.D.   Carleene Cooper III, MD 03/26/12 1038

## 2012-09-15 ENCOUNTER — Encounter (HOSPITAL_COMMUNITY): Payer: Self-pay | Admitting: *Deleted

## 2012-09-15 ENCOUNTER — Emergency Department (HOSPITAL_COMMUNITY)
Admission: EM | Admit: 2012-09-15 | Discharge: 2012-09-15 | Disposition: A | Discharge status: Home / Self Care | Payer: BC Managed Care – PPO | Attending: Emergency Medicine | Admitting: Emergency Medicine

## 2012-09-15 ENCOUNTER — Emergency Department (HOSPITAL_COMMUNITY): Payer: BC Managed Care – PPO

## 2012-09-15 DIAGNOSIS — S0181XA Laceration without foreign body of other part of head, initial encounter: Secondary | ICD-10-CM

## 2012-09-15 DIAGNOSIS — S01109A Unspecified open wound of unspecified eyelid and periocular area, initial encounter: Secondary | ICD-10-CM | POA: Insufficient documentation

## 2012-09-15 DIAGNOSIS — I10 Essential (primary) hypertension: Secondary | ICD-10-CM | POA: Insufficient documentation

## 2012-09-15 DIAGNOSIS — Z8679 Personal history of other diseases of the circulatory system: Secondary | ICD-10-CM | POA: Insufficient documentation

## 2012-09-15 DIAGNOSIS — Z23 Encounter for immunization: Secondary | ICD-10-CM | POA: Insufficient documentation

## 2012-09-15 DIAGNOSIS — S01409A Unspecified open wound of unspecified cheek and temporomandibular area, initial encounter: Secondary | ICD-10-CM | POA: Insufficient documentation

## 2012-09-15 DIAGNOSIS — S02839A Fracture of medial orbital wall, unspecified side, initial encounter for closed fracture: Secondary | ICD-10-CM

## 2012-09-15 DIAGNOSIS — R112 Nausea with vomiting, unspecified: Secondary | ICD-10-CM | POA: Insufficient documentation

## 2012-09-15 DIAGNOSIS — S0120XA Unspecified open wound of nose, initial encounter: Secondary | ICD-10-CM | POA: Insufficient documentation

## 2012-09-15 DIAGNOSIS — S0280XA Fracture of other specified skull and facial bones, unspecified side, initial encounter for closed fracture: Secondary | ICD-10-CM | POA: Insufficient documentation

## 2012-09-15 DIAGNOSIS — Z862 Personal history of diseases of the blood and blood-forming organs and certain disorders involving the immune mechanism: Secondary | ICD-10-CM | POA: Insufficient documentation

## 2012-09-15 DIAGNOSIS — Z8719 Personal history of other diseases of the digestive system: Secondary | ICD-10-CM | POA: Insufficient documentation

## 2012-09-15 DIAGNOSIS — Z79899 Other long term (current) drug therapy: Secondary | ICD-10-CM | POA: Insufficient documentation

## 2012-09-15 MED ORDER — TETANUS-DIPHTH-ACELL PERTUSSIS 5-2.5-18.5 LF-MCG/0.5 IM SUSP
0.5000 mL | Freq: Once | INTRAMUSCULAR | Status: AC
  Administered 2012-09-15: 0.5 mL via INTRAMUSCULAR
  Filled 2012-09-15: qty 0.5

## 2012-09-15 MED ORDER — MORPHINE SULFATE 4 MG/ML IJ SOLN
INTRAMUSCULAR | Status: AC
  Administered 2012-09-15: 4 mg via INTRAVENOUS
  Filled 2012-09-15: qty 1

## 2012-09-15 MED ORDER — PROMETHAZINE HCL 25 MG PO TABS: 25.0000 mg | ORAL_TABLET | Freq: Four times a day (QID) | ORAL | Status: DC | PRN

## 2012-09-15 MED ORDER — ONDANSETRON HCL 4 MG/2ML IJ SOLN
INTRAMUSCULAR | Status: AC
  Administered 2012-09-15: 4 mg via INTRAVENOUS
  Filled 2012-09-15: qty 2

## 2012-09-15 MED ORDER — ONDANSETRON HCL 4 MG/2ML IJ SOLN
4.0000 mg | Freq: Once | INTRAMUSCULAR | Status: AC
  Administered 2012-09-15: 4 mg via INTRAVENOUS
  Filled 2012-09-15: qty 2

## 2012-09-15 MED ORDER — HYDROCODONE-ACETAMINOPHEN 5-325 MG PO TABS: ORAL_TABLET | ORAL | Status: DC

## 2012-09-15 MED ORDER — MORPHINE SULFATE 4 MG/ML IJ SOLN
4.0000 mg | Freq: Once | INTRAMUSCULAR | Status: AC
  Administered 2012-09-15: 4 mg via INTRAVENOUS
  Filled 2012-09-15: qty 1

## 2012-09-15 MED ORDER — MORPHINE SULFATE 4 MG/ML IJ SOLN
4.0000 mg | Freq: Once | INTRAMUSCULAR | Status: AC
  Administered 2012-09-15: 4 mg via INTRAVENOUS

## 2012-09-15 MED ORDER — ONDANSETRON HCL 4 MG/2ML IJ SOLN
4.0000 mg | Freq: Once | INTRAMUSCULAR | Status: AC
  Administered 2012-09-15: 4 mg via INTRAVENOUS

## 2012-09-15 NOTE — Discharge Instructions (Signed)
 Facial Laceration A facial laceration is a cut on the face. Lacerations usually heal quickly, but they need special care to reduce scarring. It will take 1 to 2 years for the scar to lose its redness and to heal completely. TREATMENT  Some facial lacerations may not require closure. Some lacerations may not be able to be closed due to an increased risk of infection. It is important to see your caregiver as soon as possible after an injury to minimize the risk of infection and to maximize the opportunity for successful closure. If closure is appropriate, pain medicines may be given, if needed. The wound will be cleaned to help prevent infection. Your caregiver will use stitches (sutures), staples, wound glue (adhesive), or skin adhesive strips to repair the laceration. These tools bring the skin edges together to allow for faster healing and a better cosmetic outcome. However, all wounds will heal with a scar.  Once the wound has healed, scarring can be minimized by covering the wound with sunscreen during the day for 1 full year. Use a sunscreen with an SPF of at least 30. Sunscreen helps to reduce the pigment that will form in the scar. When applying sunscreen to a completely healed wound, massage the scar for a few minutes to help reduce the appearance of the scar. Use circular motions with your fingertips, on and around the scar. Do not massage a healing wound. HOME CARE INSTRUCTIONS For sutures:  Keep the wound clean and dry.  If you were given a bandage (dressing), you should change it at least once a day. Also change the dressing if it becomes wet or dirty, or as directed by your caregiver.  Wash the wound with soap and water 2 times a day. Rinse the wound off with water to remove all soap. Pat the wound dry with a clean towel.  After cleaning, apply a thin layer of the antibiotic ointment recommended by your caregiver. This will help prevent infection and keep the dressing from sticking.  You  may shower as usual after the first 24 hours. Do not soak the wound in water until the sutures are removed.  Only take over-the-counter or prescription medicines for pain, discomfort, or fever as directed by your caregiver.  Get your sutures removed as directed by your caregiver. With facial lacerations, sutures should usually be taken out after 4 to 5 days to avoid stitch marks.  Wait a few days after your sutures are removed before applying makeup. For skin adhesive strips:  Keep the wound clean and dry.  Do not get the skin adhesive strips wet. You may bathe carefully, using caution to keep the wound dry.  If the wound gets wet, pat it dry with a clean towel.  Skin adhesive strips will fall off on their own. You may trim the strips as the wound heals. Do not remove skin adhesive strips that are still stuck to the wound. They will fall off in time. For wound adhesive:  You may briefly wet your wound in the shower or bath. Do not soak or scrub the wound. Do not swim. Avoid periods of heavy perspiration until the skin adhesive has fallen off on its own. After showering or bathing, gently pat the wound dry with a clean towel.  Do not apply liquid medicine, cream medicine, ointment medicine, or makeup to your wound while the skin adhesive is in place. This may loosen the film before your wound is healed.  If a dressing is placed over the  wound, be careful not to apply tape directly over the skin adhesive. This may cause the adhesive to be pulled off before the wound is healed.  Avoid prolonged exposure to sunlight or tanning lamps while the skin adhesive is in place. Exposure to ultraviolet light in the first year will darken the scar.  The skin adhesive will usually remain in place for 5 to 10 days, then naturally fall off the skin. Do not pick at the adhesive film. You may need a tetanus shot if:  You cannot remember when you had your last tetanus shot.  You have never had a tetanus  shot. If you get a tetanus shot, your arm may swell, get red, and feel warm to the touch. This is common and not a problem. If you need a tetanus shot and you choose not to have one, there is a rare chance of getting tetanus. Sickness from tetanus can be serious. SEEK IMMEDIATE MEDICAL CARE IF:  You develop redness, pain, or swelling around the wound.  There is yellowish-white fluid (pus) coming from the wound.  You develop chills or a fever. MAKE SURE YOU:  Understand these instructions.  Will watch your condition.  Will get help right away if you are not doing well or get worse. Document Released: 08/10/2004 Document Revised: 09/25/2011 Document Reviewed: 12/26/2010 [facility] Patient Information 2013 [place], [place].   Facial Fracture A facial fracture is a break in one of the bones of your face. HOME CARE INSTRUCTIONS   Protect the injured part of your face until it is healed.  Do not participate in activities which give chance for re-injury until your doctor approves.  Gently wash and dry your face.  Wear head and facial protection while riding a bicycle, motorcycle, or snowmobile. SEEK MEDICAL CARE IF:   An oral temperature above 102 F (38.9 C) develops.  You have severe headaches or notice changes in your vision.  You have new numbness or tingling in your face.  You develop nausea (feeling sick to your stomach), vomiting or a stiff neck. SEEK IMMEDIATE MEDICAL CARE IF:   You develop difficulty seeing or experience double vision.  You become dizzy, lightheaded, or faint.  You develop trouble speaking, breathing, or swallowing.  You have a watery discharge from your nose or ear. MAKE SURE YOU:   Understand these instructions.  Will watch your condition.  Will get help right away if you are not doing well or get worse. Document Released: 07/03/2005 Document Revised: 09/25/2011 Document Reviewed: 02/20/2008 [facility] Patient Information 2013 [place],  [place].   Narcotic and benzodiazepine use may cause drowsiness, slowed breathing or dependence.  Please use with caution and do not drive, operate machinery or watch young children alone while taking them.  Taking combinations of these medications or drinking alcohol will potentiate these effects.

## 2012-09-15 NOTE — ED Provider Notes (Signed)
LACERATION REPAIR Performed by: Otilio Miu Authorized by: Ruby Cola E Consent: Verbal consent obtained. Risks and benefits: risks, benefits and alternatives were discussed Consent given by: patient Patient identity confirmed: provided demographic data Prepped and Draped in normal sterile fashion Wound explored  Laceration Location: left cheek  Laceration Length: 7cm  No Foreign Bodies seen or palpated  Anesthesia: local infiltration  Local anesthetic: lidocaine 2% w/out epinephrine  Anesthetic total: 10 ml  Irrigation method: syringe Amount of cleaning: standard  Skin closure: vicryl rapide 5.0 and prolene 6.0  Number of sutures: 3 vicryl and 19 prolene Technique: simple interrupted  Patient tolerance: Patient tolerated the procedure well with no immediate complications.   Otilio Miu, PA-C 09/15/12 [id number]
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
## 2012-09-19 ENCOUNTER — Emergency Department (HOSPITAL_COMMUNITY): Payer: BC Managed Care – PPO

## 2012-09-19 ENCOUNTER — Emergency Department (HOSPITAL_COMMUNITY)
Admission: EM | Admit: 2012-09-19 | Discharge: 2012-09-19 | Disposition: A | Discharge status: Home / Self Care | Payer: BC Managed Care – PPO | Attending: Emergency Medicine | Admitting: Emergency Medicine

## 2012-09-19 ENCOUNTER — Encounter (HOSPITAL_COMMUNITY): Payer: Self-pay | Admitting: Emergency Medicine

## 2012-09-19 DIAGNOSIS — Z8719 Personal history of other diseases of the digestive system: Secondary | ICD-10-CM | POA: Insufficient documentation

## 2012-09-19 DIAGNOSIS — Z862 Personal history of diseases of the blood and blood-forming organs and certain disorders involving the immune mechanism: Secondary | ICD-10-CM | POA: Insufficient documentation

## 2012-09-19 DIAGNOSIS — Z79899 Other long term (current) drug therapy: Secondary | ICD-10-CM | POA: Insufficient documentation

## 2012-09-19 DIAGNOSIS — H00036 Abscess of eyelid left eye, unspecified eyelid: Secondary | ICD-10-CM

## 2012-09-19 DIAGNOSIS — H532 Diplopia: Secondary | ICD-10-CM | POA: Insufficient documentation

## 2012-09-19 DIAGNOSIS — H00039 Abscess of eyelid unspecified eye, unspecified eyelid: Secondary | ICD-10-CM | POA: Insufficient documentation

## 2012-09-19 DIAGNOSIS — I10 Essential (primary) hypertension: Secondary | ICD-10-CM | POA: Insufficient documentation

## 2012-09-19 LAB — POCT I-STAT, CHEM 8
BUN: 10 mg/dL (ref 6–23)
Calcium, Ion: 1.21 mmol/L (ref 1.12–1.23)
Chloride: 100 mEq/L (ref 96–112)
Creatinine, Ser: 0.8 mg/dL (ref 0.50–1.10)
Glucose, Bld: 106 mg/dL — ABNORMAL HIGH (ref 70–99)
HCT: 32 % — ABNORMAL LOW (ref 36.0–46.0)
Hemoglobin: 10.9 g/dL — ABNORMAL LOW (ref 12.0–15.0)
Potassium: 3.5 mEq/L (ref 3.5–5.1)
Sodium: 141 mEq/L (ref 135–145)
TCO2: 32 mmol/L (ref 0–100)

## 2012-09-19 MED ORDER — AMOXICILLIN-POT CLAVULANATE 875-125 MG PO TABS: 1.0000 | ORAL_TABLET | Freq: Two times a day (BID) | ORAL | Status: DC

## 2012-09-19 MED ORDER — IOHEXOL 300 MG/ML  SOLN
75.0000 mL | Freq: Once | INTRAMUSCULAR | Status: AC | PRN
  Administered 2012-09-19: 75 mL via INTRAVENOUS

## 2012-09-19 MED ORDER — MORPHINE SULFATE 4 MG/ML IJ SOLN
4.0000 mg | Freq: Once | INTRAMUSCULAR | Status: AC
  Administered 2012-09-19: 4 mg via INTRAVENOUS
  Filled 2012-09-19: qty 1

## 2012-09-19 MED ORDER — AMOXICILLIN-POT CLAVULANATE 875-125 MG PO TABS
1.0000 | ORAL_TABLET | Freq: Once | ORAL | Status: AC
  Administered 2012-09-19: 1 via ORAL
  Filled 2012-09-19: qty 1

## 2012-09-19 NOTE — Procedures (Signed)
Patient presents for suture removal. The wound is infected:  pre-septal cellulitis.  First dose of abx given here in the ED.  Rx for Augmentin.  The sutures are removed. Wound care and activity instructions given. Return prn.
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
## 2012-09-19 NOTE — ED Notes (Signed)
Pt dc to home.  Pt ambulatory to exit without difficulty.  Pt denies need for w/c.  
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
## 2012-09-20 NOTE — Procedures (Signed)
  Medical screening examination/treatment/procedure(s) were performed by non-physician practitioner and as supervising physician I was immediately available for consultation/collaboration.     

## 2012-09-20 NOTE — ED Provider Notes (Signed)
Medical screening examination/treatment/procedure(s) were performed by non-physician practitioner and as supervising physician I was immediately available for consultation/collaboration.  Brian Opitz, MD 09/20/12 0455 

## 2013-01-29 ENCOUNTER — Emergency Department (HOSPITAL_COMMUNITY)
Admission: EM | Admit: 2013-01-29 | Discharge: 2013-01-30 | Disposition: A | Discharge status: Home / Self Care | Payer: BC Managed Care – PPO | Attending: Emergency Medicine | Admitting: Emergency Medicine

## 2013-01-29 ENCOUNTER — Encounter (HOSPITAL_COMMUNITY): Payer: Self-pay | Admitting: Emergency Medicine

## 2013-01-29 ENCOUNTER — Emergency Department (HOSPITAL_COMMUNITY): Payer: BC Managed Care – PPO

## 2013-01-29 DIAGNOSIS — I10 Essential (primary) hypertension: Secondary | ICD-10-CM | POA: Insufficient documentation

## 2013-01-29 DIAGNOSIS — Z79899 Other long term (current) drug therapy: Secondary | ICD-10-CM | POA: Insufficient documentation

## 2013-01-29 DIAGNOSIS — M25519 Pain in unspecified shoulder: Secondary | ICD-10-CM | POA: Insufficient documentation

## 2013-01-29 DIAGNOSIS — M25511 Pain in right shoulder: Secondary | ICD-10-CM

## 2013-01-29 DIAGNOSIS — Z8719 Personal history of other diseases of the digestive system: Secondary | ICD-10-CM | POA: Insufficient documentation

## 2013-01-29 DIAGNOSIS — Z862 Personal history of diseases of the blood and blood-forming organs and certain disorders involving the immune mechanism: Secondary | ICD-10-CM | POA: Insufficient documentation

## 2013-01-29 NOTE — ED Notes (Signed)
PT. REPORTS " SLEPT WRONG" LAST NIGHT , STATED PAIN AT RIGHT SHOULDER JOINT WITH STIFFNESS / PAIN WITH MOVEMENT AND CERTAIN POSITIONS .

## 2013-01-30 ENCOUNTER — Other Ambulatory Visit: Payer: Self-pay

## 2013-01-30 MED ORDER — HYDROCODONE-ACETAMINOPHEN 5-325 MG PO TABS
2.0000 | ORAL_TABLET | Freq: Once | ORAL | Status: AC
  Administered 2013-01-30: 2 via ORAL
  Filled 2013-01-30: qty 2

## 2013-01-30 MED ORDER — HYDROCODONE-ACETAMINOPHEN 5-325 MG PO TABS: 1.0000 | ORAL_TABLET | ORAL | Status: DC | PRN

## 2013-01-30 NOTE — ED Notes (Signed)
Pt has ride home.

## 2013-01-30 NOTE — ED Provider Notes (Signed)
History    CSN: [id number] Arrival date & time 01/29/13  2119  First MD Initiated Contact with Patient 01/29/13 2316     Chief Complaint  Patient presents with  . Shoulder Pain   (Consider location/radiation/quality/duration/timing/severity/associated sxs/prior Treatment) HPI History provided by pt.   Pt c/o 2 days non-traumatic R shoulder pain.  Started upon waking.  Severe, radiates down arm to fingertips, no relief w/ aspirin, and associated w/ RUE weakness/paresthesias.  No h/o same and denies pain in any other joint, including cervical spine.  Denies fever, skin changes and CP.  Has had intermittent, non-exertional SOB over the past week.    Past Medical History  Diagnosis Date  . Hypertension   . GERD (gastroesophageal reflux disease)   . Anemia   . Sickle cell trait   . Morbid obesity with BMI of 50.0-59.9, adult   . Chest pain     a. 02/2012 abnl myoview - inferoapical reverisbility concerning for ischemia, EF 59%;   02/2012 nonobstructive cath   Past Surgical History  Procedure Laterality Date  . Cholecystectomy    . Tubal ligation    . Spine surgery      fusion   Family History  Problem Relation Age of Onset  . Coronary artery disease Father 30  . Sudden death Mother 85    Questionable MI   History  Substance Use Topics  . Smoking status: Never Smoker   . Smokeless tobacco: Not on file  . Alcohol Use: No   OB History   Grav Para Term Preterm Abortions TAB SAB Ect Mult Living                 Review of Systems  All other systems reviewed and are negative.    Allergies  Review of patient's allergies indicates no known allergies.  Home Medications   Current Outpatient Rx  Name  Route  Sig  Dispense  Refill  . amLODipine (NORVASC) 5 MG tablet   Oral   Take 1 tablet (5 mg total) by mouth daily.   30 tablet   6    BP 160/87  Pulse 94  Temp(Src) 98.6 F (37 C) (Oral)  Resp 16  SpO2 93% Physical Exam  Nursing note and vitals  reviewed. Constitutional: She is oriented to person, place, and time. She appears well-developed and well-nourished. No distress.  HENT:  Head: Normocephalic and atraumatic.  Eyes:  Normal appearance  Neck: Normal range of motion.  Cardiovascular: Normal rate and regular rhythm.   Pulmonary/Chest: Effort normal and breath sounds normal. No respiratory distress.  Musculoskeletal: Normal range of motion.  Mild tenderness over ~C5-C6.  No deformity, erythema, edema or ecchymosis R shoulder. Tenderness R scapula and proximal humerus.  Pain w/ minimal ROM of R shoulder and pain in R shoulder w/ ROM of elbow as well.  Mild, diffuse tenderness R wrist and pain w/ ROM of wrist.  2+ radial pulse and distal sensation intact.      Neurological: She is alert and oriented to person, place, and time.  Skin: Skin is warm and dry. No rash noted.  Psychiatric: She has a normal mood and affect. Her behavior is normal.    ED Course  Procedures (including critical care time)   Date: 01/30/2013  Rate: 73  Rhythm: normal sinus rhythm  QRS Axis: normal  Intervals: normal  ST/T Wave abnormalities: normal  Conduction Disutrbances:none  Narrative Interpretation:   Old EKG Reviewed: unchanged   Labs Reviewed - No  data to display Dg Shoulder Right  01/29/2013   *RADIOLOGY REPORT*  Clinical Data: Right shoulder pain for 1 day  RIGHT SHOULDER - 2+ VIEW  Comparison: None.  Findings: There is mild osteoarthritis involving the acromioclavicular joint and glenohumeral joint.  There is no acute fracture or subluxation identified.  There is no radio-opaque foreign body or soft tissue calcifications identified.  IMPRESSION:  1.  Osteoarthritis. 2.  No acute findings.   Original Report Authenticated By: Signa Kell, M.D.   1. Pain in right shoulder     MDM  [phone] F presents w/ non-traumatic pain in R shoulder x 2d.  No recent CP but has had non-exertional SOB this past week.  Screening EKG non-ischemic.  No  significant cervical spine tenderness, no signs of septic arthritis, NV RUE intact.  Xray shows OA.  Pt reassured.  Ortho tech provided her w/ shoulder sling and I prescribed vicodin for pain.  Referred to ortho and recommended f/u with PCP for persistent SOB.  Return precautions discussed.    Otilio Miu, PA-C 01/31/13 0151

## 2013-01-30 NOTE — ED Notes (Signed)
PA at bedside.

## 2013-01-31 NOTE — ED Provider Notes (Signed)
Medical screening examination/treatment/procedure(s) were performed by non-physician practitioner and as supervising physician I was immediately available for consultation/collaboration.  Montee Tallman, MD 01/31/13 0440 

## 2013-09-15 ENCOUNTER — Emergency Department (HOSPITAL_COMMUNITY): Payer: BC Managed Care – PPO

## 2013-09-15 ENCOUNTER — Observation Stay (HOSPITAL_COMMUNITY)
Admission: EM | Admit: 2013-09-15 | Discharge: 2013-09-17 | Disposition: A | Discharge status: Home / Self Care | Payer: BC Managed Care – PPO | Attending: Internal Medicine | Admitting: Internal Medicine

## 2013-09-15 ENCOUNTER — Encounter (HOSPITAL_COMMUNITY): Payer: Self-pay | Admitting: Emergency Medicine

## 2013-09-15 DIAGNOSIS — R609 Edema, unspecified: Secondary | ICD-10-CM | POA: Insufficient documentation

## 2013-09-15 DIAGNOSIS — D649 Anemia, unspecified: Secondary | ICD-10-CM | POA: Insufficient documentation

## 2013-09-15 DIAGNOSIS — K219 Gastro-esophageal reflux disease without esophagitis: Secondary | ICD-10-CM | POA: Insufficient documentation

## 2013-09-15 DIAGNOSIS — Z6841 Body Mass Index (BMI) 40.0 and over, adult: Secondary | ICD-10-CM

## 2013-09-15 DIAGNOSIS — I10 Essential (primary) hypertension: Secondary | ICD-10-CM | POA: Diagnosis present

## 2013-09-15 DIAGNOSIS — D573 Sickle-cell trait: Secondary | ICD-10-CM | POA: Insufficient documentation

## 2013-09-15 DIAGNOSIS — R072 Precordial pain: Secondary | ICD-10-CM

## 2013-09-15 DIAGNOSIS — R42 Dizziness and giddiness: Secondary | ICD-10-CM | POA: Insufficient documentation

## 2013-09-15 DIAGNOSIS — R079 Chest pain, unspecified: Secondary | ICD-10-CM | POA: Diagnosis present

## 2013-09-15 DIAGNOSIS — R0789 Other chest pain: Principal | ICD-10-CM | POA: Insufficient documentation

## 2013-09-15 DIAGNOSIS — E785 Hyperlipidemia, unspecified: Secondary | ICD-10-CM | POA: Diagnosis present

## 2013-09-15 DIAGNOSIS — R0602 Shortness of breath: Secondary | ICD-10-CM | POA: Diagnosis present

## 2013-09-15 DIAGNOSIS — R002 Palpitations: Secondary | ICD-10-CM | POA: Diagnosis present

## 2013-09-15 LAB — CBC
HCT: 32.8 % — ABNORMAL LOW (ref 36.0–46.0)
Hemoglobin: 10.9 g/dL — ABNORMAL LOW (ref 12.0–15.0)
MCH: 25.2 pg — ABNORMAL LOW (ref 26.0–34.0)
MCHC: 33.2 g/dL (ref 30.0–36.0)
MCV: 75.9 fL — ABNORMAL LOW (ref 78.0–100.0)
Platelets: 306 10*3/uL (ref 150–400)
RBC: 4.32 MIL/uL (ref 3.87–5.11)
RDW: 16.1 % — ABNORMAL HIGH (ref 11.5–15.5)
WBC: 4.1 10*3/uL (ref 4.0–10.5)

## 2013-09-15 LAB — I-STAT TROPONIN, ED
Troponin i, poc: 0 ng/mL (ref 0.00–0.08)
Troponin i, poc: 0.01 ng/mL (ref 0.00–0.08)

## 2013-09-15 LAB — BASIC METABOLIC PANEL
BUN: 12 mg/dL (ref 6–23)
CO2: 30 mEq/L (ref 19–32)
Calcium: 9 mg/dL (ref 8.4–10.5)
Chloride: 101 mEq/L (ref 96–112)
Creatinine, Ser: 0.82 mg/dL (ref 0.50–1.10)
GFR calc Af Amer: >90 mL/min (ref >90)
GFR calc non Af Amer: 81 mL/min — ABNORMAL LOW (ref >90)
Glucose, Bld: 102 mg/dL — ABNORMAL HIGH (ref 70–99)
Potassium: 3.5 mEq/L — ABNORMAL LOW (ref 3.7–5.3)
Sodium: 140 mEq/L (ref 137–147)

## 2013-09-15 MED ORDER — ASPIRIN 81 MG PO CHEW
324.0000 mg | CHEWABLE_TABLET | Freq: Once | ORAL | Status: AC
  Administered 2013-09-15: 324 mg via ORAL
  Filled 2013-09-15: qty 4

## 2013-09-15 MED ORDER — MORPHINE SULFATE 2 MG/ML IJ SOLN
2.0000 mg | Freq: Once | INTRAMUSCULAR | Status: AC
  Administered 2013-09-15: 2 mg via INTRAVENOUS
  Filled 2013-09-15: qty 1

## 2013-09-15 MED ORDER — HEPARIN SODIUM (PORCINE) 5000 UNIT/ML IJ SOLN
5000.0000 [IU] | Freq: Three times a day (TID) | INTRAMUSCULAR | Status: DC
  Administered 2013-09-15 – 2013-09-17 (×5): 5000 [IU] via SUBCUTANEOUS
  Filled 2013-09-15 (×8): qty 1

## 2013-09-15 MED ORDER — ONDANSETRON HCL 4 MG/2ML IJ SOLN
4.0000 mg | Freq: Four times a day (QID) | INTRAMUSCULAR | Status: DC | PRN
  Administered 2013-09-15: 4 mg via INTRAVENOUS
  Filled 2013-09-15: qty 2

## 2013-09-15 MED ORDER — ACETAMINOPHEN 325 MG PO TABS
650.0000 mg | ORAL_TABLET | ORAL | Status: DC | PRN
  Administered 2013-09-16 – 2013-09-17 (×3): 650 mg via ORAL
  Filled 2013-09-15 (×3): qty 2

## 2013-09-15 MED ORDER — NITROGLYCERIN 0.4 MG SL SUBL
0.4000 mg | SUBLINGUAL_TABLET | SUBLINGUAL | Status: DC | PRN
  Administered 2013-09-15 (×3): 0.4 mg via SUBLINGUAL
  Filled 2013-09-15: qty 1

## 2013-09-15 NOTE — ED Notes (Signed)
Alert, NAD, calm, interactive, (denies: pain or sob), "feels better", admits to some mild nausea and mild dizziness.

## 2013-09-15 NOTE — ED Notes (Signed)
Repeat EKG done on pt and Dr. Bartholome Bill signed.

## 2013-09-15 NOTE — ED Notes (Signed)
Around 1530 pt felt as if the thumb and pointer finger on her R hand locked up (this happens often to pt).  At the same time she began feeling lightheaded, nauseated and as if her heart was racing.  PT keeps feeling like she is choking.  VS stable.  Pt has had a panic attack in past, but this does not feel the same.

## 2013-09-15 NOTE — ED Notes (Signed)
Hospitalist Garner at the bedside.

## 2013-09-15 NOTE — H&P (Signed)
[facility] History and Physical  Hannah Huerta PYK:[id number] DOB: 1960-12-29 DOA: 09/15/2013  Referring physician: EDP PCP: Elyn Peers, MD   Chief Complaint: Chest pain   HPI: Hannah Huerta is a 53 y.o. female who presents to the ED with chest pain.  Symptoms onset at 1530 with "locking up" of thumb and pointer finger on R hand (happens often).  At same time developed lightheadedness, nausea, L sided chest "pain" described as palpitations "as if she were on a treadmill".  Patient states she keeps feeling like she is choking.  Review of Systems: Systems reviewed.  As above, otherwise negative  Past Medical History  Diagnosis Date  . Hypertension   . GERD (gastroesophageal reflux disease)   . Anemia   . Sickle cell trait   . Morbid obesity with BMI of 50.0-59.9, adult   . Chest pain     a. 02/2012 abnl myoview - inferoapical reverisbility concerning for ischemia, EF 59%;   02/2012 nonobstructive cath   Past Surgical History  Procedure Laterality Date  . Cholecystectomy    . Tubal ligation    . Spine surgery      fusion   Social History:  reports that she has never smoked. She does not have any smokeless tobacco history on file. She reports that she does not drink alcohol or use illicit drugs.  No Known Allergies  Family History  Problem Relation Age of Onset  . Coronary artery disease Father 80  . Sudden death Mother 64    Questionable MI     Prior to Admission medications   Not on File   Physical Exam: Filed Vitals:   09/15/13 2141  BP: 130/76  Pulse:   Temp: 98.5 F (36.9 C)  Resp: 16    BP 130/76  Pulse 72  Temp(Src) 98.5 F (36.9 C) (Oral)  Resp 16  Ht 5\' 2"  (1.575 m)  Wt 133.811 kg (295 lb)  BMI 53.94 kg/m2  SpO2 98%  LMP 09/15/2013  General Appearance:    Alert, oriented, no distress, appears stated age  Head:    Normocephalic, atraumatic  Eyes:    PERRL, EOMI, sclera non-icteric        Nose:   Nares without drainage or epistaxis.  Mucosa, turbinates normal  Throat:   Moist mucous membranes. Oropharynx without erythema or exudate.  Neck:   Supple. No carotid bruits.  No thyromegaly.  No lymphadenopathy.   Back:     No CVA tenderness, no spinal tenderness  Lungs:     Clear to auscultation bilaterally, without wheezes, rhonchi or rales  Chest wall:    No tenderness to palpitation  Heart:    Regular rate and rhythm without murmurs, gallops, rubs  Abdomen:     Soft, non-tender, nondistended, normal bowel sounds, no organomegaly  Genitalia:    deferred  Rectal:    deferred  Extremities:   No clubbing, cyanosis or edema.  Pulses:   2+ and symmetric all extremities  Skin:   Skin color, texture, turgor normal, no rashes or lesions  Lymph nodes:   Cervical, supraclavicular, and axillary nodes normal  Neurologic:   CNII-XII intact. Normal strength, sensation and reflexes      throughout    Labs on Admission:  Basic Metabolic Panel:  Recent Labs Lab 09/15/13 1524  NA 140  K 3.5*  CL 101  CO2 30  GLUCOSE 102*  BUN 12  CREATININE 0.82  CALCIUM 9.0   Liver Function Tests: No results found  for this basename: AST, ALT, ALKPHOS, BILITOT, PROT, ALBUMIN,  in the last 168 hours No results found for this basename: LIPASE, AMYLASE,  in the last 168 hours No results found for this basename: AMMONIA,  in the last 168 hours CBC:  Recent Labs Lab 09/15/13 1524  WBC 4.1  HGB 10.9*  HCT 32.8*  MCV 75.9*  PLT 306   Cardiac Enzymes: No results found for this basename: CKTOTAL, CKMB, CKMBINDEX, TROPONINI,  in the last 168 hours  BNP (last 3 results) No results found for this basename: PROBNP,  in the last 8760 hours CBG: No results found for this basename: GLUCAP,  in the last 168 hours  Radiological Exams on Admission: Dg Chest 2 View  09/15/2013   CLINICAL DATA:  Chest pain and cardiac palpitations  EXAM: CHEST  2 VIEW  COMPARISON:  March 25, 2012  FINDINGS: Lungs are clear. Heart size and pulmonary vascularity  are normal. No adenopathy. No pneumothorax. There is mild degenerative change in the thoracic spine.  IMPRESSION: No edema or consolidation.   Electronically Signed   By: Lowella Grip M.D.   On: 09/15/2013 17:10    EKG: Independently reviewed.  Assessment/Plan Active Problems:   Chest pain   1. Chest pain - patient with cath in 2013, results of cath showed a 60% stenosis of a diagonal artery (D2) not suitable for PCI, mild irregularities in the LAD, the left circumflex and RCA were disease free.  LVEF was preserved.  Heart score = 3 in this patient, with 2 points given for CAD and 1 for age.  Will admit for serial troponins, keep her NPO for possible rule out but I suspect cards may recommend this be done outpatient.    Code Status: Full Code  Family Communication: No family in room Disposition Plan: Admit to obs  Time spent: 50 min  Yazaira Speas M. [facility] Pager [phone]  If 7AM-7PM, please contact the day team taking care of the patient [email or web address] Password [facility] 09/15/2013, 10:54 PM

## 2013-09-15 NOTE — ED Provider Notes (Signed)
CSN: [id number]     Arrival date & time 09/15/13  1510 History   First MD Initiated Contact with Patient 09/15/13 2039     Chief Complaint  Patient presents with  . Chest Pain  . Palpitations     (Consider location/radiation/quality/duration/timing/severity/associated sxs/prior Treatment) HPI Comments: 53 yo AA female presents to ER with cc of CP.    Pt was at work and developed a R hand cramp and then heart racing, dizziness, and light-headedness.  Pt also had nausea.  Onset about 1430. Son brought pt to ED.    Pt has PMH of HTN, GERD, Anemia, Sickle cell trait, obesity, and h/o CP.  No smoking, drinking, or drugs.    Seen by Cardiology a couple years ago.  Cardiac history: Cath: August 2013 Right coronary artery (RCA): Provides a single PDA and small PLA. Smooth and without disease  Left ventriculography: Left ventricular systolic function is normal, LVEF is estimated at 55-65%, there is no significant mitral regurgitation  Final Conclusions:  1. Heavily calcified small D2 with ostial and mid disease --- Not suitable for PCI  2. Preserved LV function  3. Mild irregularities of the LAD  MPS: Comparison: None  Findings: SPECT imaging demonstrates moderate area of reversibility  in the inferoapical region concerning for ischemia. No fixed  defect.  Quantitative gated analysis shows normal wall motion.  The resting left ventricular ejection fraction is 59% with end-  diastolic volume of 962 ml and end-systolic volume of 49 ml.  IMPRESSION:  Inferoapical reversibility concerning for ischemia.  Ejection fraction 59%.       Patient is a 53 y.o. female presenting with chest pain and palpitations. The history is provided by the spouse and the patient.  Chest Pain Pain location:  L chest Pain quality: aching   Pain radiates to:  L shoulder Onset quality:  Sudden Timing:  Constant Progression:  Waxing and waning Chronicity:  New Relieved by:  None tried Worsened by:   Nothing tried Ineffective treatments:  None tried Associated symptoms: palpitations   Associated symptoms: no altered mental status, no anorexia, no anxiety, no back pain, no cough, no PND and no shortness of breath   Palpitations Associated symptoms: chest pain   Associated symptoms: no back pain, no cough, no PND and no shortness of breath     Past Medical History  Diagnosis Date  . Hypertension   . GERD (gastroesophageal reflux disease)   . Anemia   . Sickle cell trait   . Morbid obesity with BMI of 50.0-59.9, adult   . Chest pain     a. 02/2012 abnl myoview - inferoapical reverisbility concerning for ischemia, EF 59%;   02/2012 nonobstructive cath   Past Surgical History  Procedure Laterality Date  . Cholecystectomy    . Tubal ligation    . Spine surgery      fusion   Family History  Problem Relation Age of Onset  . Coronary artery disease Father 27  . Sudden death Mother 65    Questionable MI   History  Substance Use Topics  . Smoking status: Never Smoker   . Smokeless tobacco: Not on file  . Alcohol Use: No   OB History   Grav Para Term Preterm Abortions TAB SAB Ect Mult Living                 Review of Systems  Unable to perform ROS Constitutional: Negative.   HENT: Negative.   Eyes: Negative.   Respiratory:  Positive for chest tightness. Negative for cough, choking, shortness of breath and stridor.   Cardiovascular: Positive for chest pain and palpitations. Negative for PND.  Gastrointestinal: Negative for anorexia.  Genitourinary: Negative.   Musculoskeletal: Negative.  Negative for back pain, gait problem and joint swelling.  Neurological: Negative.   Hematological: Negative.   All other systems reviewed and are negative.      Allergies  Review of patient's allergies indicates no known allergies.  Home Medications  No current outpatient prescriptions on file. BP 147/70  Pulse 60  Temp(Src) 97.7 F (36.5 C) (Oral)  Resp 20  Ht 5\' 2"  (1.575  m)  Wt 295 lb (133.811 kg)  BMI 53.94 kg/m2  SpO2 99%  LMP 09/15/2013 Physical Exam  Nursing note and vitals reviewed. Constitutional: She is oriented to person, place, and time. She appears well-developed and well-nourished.  HENT:  Head: Normocephalic and atraumatic.  Eyes: Conjunctivae are normal.  Neck: Normal range of motion. Neck supple. No JVD present. No tracheal deviation present.  Cardiovascular: Normal rate and regular rhythm.   Pulmonary/Chest: Effort normal and breath sounds normal. No stridor. No respiratory distress. She has no wheezes. She has no rales. She exhibits no tenderness.  Abdominal: Soft. Bowel sounds are normal. She exhibits no distension and no mass. There is no tenderness. There is no rebound and no guarding.  protuberent  Musculoskeletal: She exhibits edema.  bilat LE edema  Neurological: She is alert and oriented to person, place, and time. She has normal reflexes.  Skin: Skin is warm and dry.    ED Course  Procedures (including critical care time) Labs Review Labs Reviewed  CBC - Abnormal; Notable for the following:    Hemoglobin 10.9 (*)    HCT 32.8 (*)    MCV 75.9 (*)    MCH 25.2 (*)    RDW 16.1 (*)    All other components within normal limits  BASIC METABOLIC PANEL - Abnormal; Notable for the following:    Potassium 3.5 (*)    Glucose, Bld 102 (*)    GFR calc non Af Amer 81 (*)    All other components within normal limits  I-STAT TROPOININ, ED   Imaging Review Dg Chest 2 View  09/15/2013   CLINICAL DATA:  Chest pain and cardiac palpitations  EXAM: CHEST  2 VIEW  COMPARISON:  March 25, 2012  FINDINGS: Lungs are clear. Heart size and pulmonary vascularity are normal. No adenopathy. No pneumothorax. There is mild degenerative change in the thoracic spine.  IMPRESSION: No edema or consolidation.   Electronically Signed   By: Lowella Grip M.D.   On: 09/15/2013 17:10     EKG Interpretation   Date/Time:  Monday September 15 2013 18:36:06  EST Ventricular Rate:  73 PR Interval:  158 QRS Duration: 80 QT Interval:  402 QTC Calculation: 442 R Axis:   -34 Text Interpretation:  Normal sinus rhythm Left axis deviation Nonspecific  T wave abnormality Abnormal ECG Confirmed by Atlanta Pelto  MD, Julitza Rickles ([id number]) on  09/15/2013 8:58:46 PM      MDM   Final diagnoses:  HTN (hypertension)  Chest pain    53 year old African female presents emergency bar with chief complaint of chest pain. Patient reports that she developed chest pain while working at [employer] lifting very light objects. She reports initially having chest pain which radiated to her left arm.  ER Course: Patient given nitroglycerin x3, aspirin 325 mg. Chest pain improved from a 6 to a 2 she  was then given morphine 2 mg.   HEART Score by my interpretation: 5 (H: 1 E: 1 A 1 R 2 T 0) Pt did have  MPS and Cath in 2013.  Case d/w Dr. Alcario Drought.  Plan to admit to Tele/obs for r/o.  Patient agrees with this plan.  Vital signs stable in the emergency department and patient admitted in no acute distress.   Elmer Sow, MD 09/15/13 [phone]

## 2013-09-16 ENCOUNTER — Observation Stay (HOSPITAL_COMMUNITY): Payer: BC Managed Care – PPO

## 2013-09-16 ENCOUNTER — Encounter (HOSPITAL_COMMUNITY): Payer: Self-pay | Admitting: *Deleted

## 2013-09-16 DIAGNOSIS — D649 Anemia, unspecified: Secondary | ICD-10-CM

## 2013-09-16 DIAGNOSIS — R079 Chest pain, unspecified: Secondary | ICD-10-CM

## 2013-09-16 DIAGNOSIS — Z6841 Body Mass Index (BMI) 40.0 and over, adult: Secondary | ICD-10-CM

## 2013-09-16 DIAGNOSIS — R0602 Shortness of breath: Secondary | ICD-10-CM | POA: Diagnosis present

## 2013-09-16 DIAGNOSIS — R002 Palpitations: Secondary | ICD-10-CM | POA: Diagnosis present

## 2013-09-16 DIAGNOSIS — E785 Hyperlipidemia, unspecified: Secondary | ICD-10-CM

## 2013-09-16 DIAGNOSIS — R42 Dizziness and giddiness: Secondary | ICD-10-CM

## 2013-09-16 DIAGNOSIS — I10 Essential (primary) hypertension: Secondary | ICD-10-CM

## 2013-09-16 LAB — TROPONIN I
Troponin I: <0.3 ng/mL (ref <0.30)
Troponin I: <0.3 ng/mL (ref <0.30)
Troponin I: <0.3 ng/mL (ref <0.30)

## 2013-09-16 MED ORDER — INFLUENZA VAC SPLIT QUAD 0.5 ML IM SUSP
0.5000 mL | INTRAMUSCULAR | Status: DC
Start: 2013-09-17 — End: 2013-09-17
  Filled 2013-09-16: qty 0.5

## 2013-09-16 MED ORDER — REGADENOSON 0.4 MG/5ML IV SOLN
0.4000 mg | Freq: Once | INTRAVENOUS | Status: AC
  Administered 2013-09-16: 0.4 mg via INTRAVENOUS

## 2013-09-16 MED ORDER — REGADENOSON 0.4 MG/5ML IV SOLN
INTRAVENOUS | Status: AC
  Administered 2013-09-16: 13:00:00
  Filled 2013-09-16: qty 5

## 2013-09-16 NOTE — Plan of Care (Signed)
Problem: Phase I Progression Outcomes Goal: Aspirin unless contraindicated Outcome: Progressing ASA given in ED Goal: MD aware of Cardiac Marker results Outcome: Progressing Trop trended negative

## 2013-09-16 NOTE — Progress Notes (Signed)
UR completed 

## 2013-09-16 NOTE — Progress Notes (Addendum)
Patient ID: Hannah Huerta  female  TIR:[id number]    DOB: [birth year]    DOA: 09/15/2013  PCP: Elyn Peers, MD  Assessment/Plan: Principal Problem:   Chest pain - Risk factors hypertension, hyperlipidemia, morbid obesity. She had prior cath in 8/13 which was nonobstructive, 60% stenosis of a diagonal artery (D2) not suitable for PCI, mild irregularities in the LAD, the left circumflex and RCA were disease free. LVEF was preserved. - Cardiology consulted, rule out ACS, recommended 2 days lexiscan myoview stress test - Also recommended 2-D echocardiogram with recent shortness of breath and palpitations, will also check TSH.  Active Problems:   Morbid obesity with BMI of 50.0-59.9, adult - Patient counseled strongly on exercise and weight control    HTN (hypertension) - Patient not on any antihypertensives outpatient    Hyperlipidemia -Check lipid panel   DVT Prophylaxis:  Code Status:Full code  Family Communication:  Disposition:Hopefully tomorrow after the stress test on the 2-D echocardiogram  Consultants:  Cardiology  Procedures:  Stress test- 2 days  Antibiotics:  None    Subjective: Patient reports another episode of chest pain this morning, currently improved at the time of my examination, no nausea no vomiting, fevers or chills or coughing.   Objective: Weight change:   Intake/Output Summary (Last 24 hours) at 09/16/13 1451 Last data filed at 09/16/13 0300  Gross per 24 hour  Intake      0 ml  Output      0 ml  Net      0 ml   Blood pressure 150/71, pulse 82, temperature 98 F (36.7 C), temperature source Oral, resp. rate 18, height 5\' 2"  (1.575 m), weight 137.077 kg (302 lb 3.2 oz), last menstrual period 09/15/2013, SpO2 95.00%.  Physical Exam: General: Alert and awake, oriented x3, not in any acute distress. CVS: S1-S2 clear, no murmur rubs or gallops Chest: clear to auscultation bilaterally, no wheezing, rales or rhonchi Abdomen:  morbidly  obese soft nontender, nondistended, normal bowel sounds  Extremities: no cyanosis, clubbing or edema noted bilaterally Neuro: Cranial nerves II-XII intact, no focal neurological deficits  Lab Results: Basic Metabolic Panel:  Recent Labs Lab 09/15/13 1524  NA 140  K 3.5*  CL 101  CO2 30  GLUCOSE 102*  BUN 12  CREATININE 0.82  CALCIUM 9.0   Liver Function Tests: No results found for this basename: AST, ALT, ALKPHOS, BILITOT, PROT, ALBUMIN,  in the last 168 hours No results found for this basename: LIPASE, AMYLASE,  in the last 168 hours No results found for this basename: AMMONIA,  in the last 168 hours CBC:  Recent Labs Lab 09/15/13 1524  WBC 4.1  HGB 10.9*  HCT 32.8*  MCV 75.9*  PLT 306   Cardiac Enzymes:  Recent Labs Lab 09/16/13 0403 09/16/13 1005  TROPONINI <0.30 <0.30   BNP: No components found with this basename: POCBNP,  CBG: No results found for this basename: GLUCAP,  in the last 168 hours   Micro Results: No results found for this or any previous visit (from the past 240 hour(s)).  Studies/Results: Dg Chest 2 View  09/15/2013   CLINICAL DATA:  Chest pain and cardiac palpitations  EXAM: CHEST  2 VIEW  COMPARISON:  March 25, 2012  FINDINGS: Lungs are clear. Heart size and pulmonary vascularity are normal. No adenopathy. No pneumothorax. There is mild degenerative change in the thoracic spine.  IMPRESSION: No edema or consolidation.   Electronically Signed   By: Lowella Grip  M.D.   On: 09/15/2013 17:10    Medications: Scheduled Meds: . heparin  5,000 Units Subcutaneous 3 times per day  . [START ON 09/17/2013] influenza vac split quadrivalent PF  0.5 mL Intramuscular Tomorrow-1000      LOS: 1 day   RAI,RIPUDEEP M.D. [facility] 09/16/2013, 2:51 PM Pager: [phone]  If 7PM-7AM, please contact night-coverage [email or web address] Password [email or web address]

## 2013-09-16 NOTE — Progress Notes (Signed)
Admit date: 09/15/2013 Referring Physician  Dr. Tana Coast Primary Physician  Dr. Criss Rosales Primary Cardiologist  None Reason for Consultation  Chest pain  HPI: Hannah Huerta is a 53 y.o. female who presents to the ED with chest pain. Symptoms onset at 1530 yesterday  with "locking up" of thumb and pointer finger on R hand (happens often). At same time developed lightheadedness, nausea, L sided chest "pain" described as palpitations "as if she were on a treadmill". Patient states she keeps feeling like she is choking.      PMH:   Past Medical History  Diagnosis Date  . Hypertension   . GERD (gastroesophageal reflux disease)   . Anemia   . Sickle cell trait   . Morbid obesity with BMI of 50.0-59.9, adult   . Chest pain     a. 02/2012 abnl myoview - inferoapical reverisbility concerning for ischemia, EF 59%;   02/2012 nonobstructive cath     PSH:   Past Surgical History  Procedure Laterality Date  . Cholecystectomy    . Tubal ligation    . Spine surgery      fusion    Allergies:  Review of patient's allergies indicates no known allergies. Prior to Admit Meds:   No prescriptions prior to admission   Fam HX:    Family History  Problem Relation Age of Onset  . Coronary artery disease Father 68  . Sudden death Mother 25    Questionable MI   Social HX:    History   Social History  . Marital Status: Married    Spouse Name: N/A    Number of Children: 3  . Years of Education: N/A   Occupational History  . Works in the [employer]ics  . Smoking status: Never Smoker   . Smokeless tobacco: Not on file  . Alcohol Use: No  . Drug Use: No  . Sexual Activity: Yes    Birth Control/ Protection: Surgical   Other Topics Concern  . Not on file   Social History Narrative   Lives at home with husband and two of her sons.       ROS:  All 11 ROS were addressed and are negative except what is stated in the HPI  Physical Exam: Blood pressure 144/70, pulse  62, temperature 98 F (36.7 C), temperature source Oral, resp. rate 18, height 5\' 2"  (1.575 m), weight 302 lb 3.2 oz (137.077 kg), last menstrual period 09/15/2013, SpO2 95.00%.    General: Well developed, well nourished, in no acute distress Head: Eyes PERRLA, No xanthomas.   Normal cephalic and atramatic  Lungs:   Clear bilaterally to auscultation and percussion. Heart:   HRRR S1 S2 Pulses are 2+ & equal.            No carotid bruit. No JVD.  No abdominal bruits. No femoral bruits. Abdomen: Bowel sounds are positive, abdomen soft and non-tender without masses Extremities:   No clubbing, cyanosis or edema.  DP +1 Neuro: Alert and oriented X 3. Psych:  Good affect, responds appropriately    Labs:   Lab Results  Component Value Date   WBC 4.1 09/15/2013   HGB 10.9* 09/15/2013   HCT 32.8* 09/15/2013   MCV 75.9* 09/15/2013   PLT 306 09/15/2013    Recent Labs Lab 09/15/13 1524  NA 140  K 3.5*  CL 101  CO2 30  BUN 12  CREATININE 0.82  CALCIUM 9.0  GLUCOSE 102*  No results found for this basename: PTT   Lab Results  Component Value Date   INR 1.07 03/08/2012   INR 0.9 08/08/2007   Lab Results  Component Value Date   CKTOTAL 284* 03/06/2012   CKMB 4.3* 03/06/2012   TROPONINI <0.30 09/16/2013     Lab Results  Component Value Date   CHOL 180 03/08/2012   Lab Results  Component Value Date   HDL 57 03/08/2012   Lab Results  Component Value Date   LDLCALC 103* 03/08/2012   Lab Results  Component Value Date   TRIG 98 03/08/2012   Lab Results  Component Value Date   CHOLHDL 3.2 03/08/2012   No results found for this basename: LDLDIRECT      Radiology:  Dg Chest 2 View  09/15/2013   CLINICAL DATA:  Chest pain and cardiac palpitations  EXAM: CHEST  2 VIEW  COMPARISON:  March 25, 2012  FINDINGS: Lungs are clear. Heart size and pulmonary vascularity are normal. No adenopathy. No pneumothorax. There is mild degenerative change in the thoracic spine.  IMPRESSION: No edema or  consolidation.   Electronically Signed   By: Lowella Grip M.D.   On: 09/15/2013 17:10    EKG:  NSR with nonspecific T wave abnormality  ASSESSMENT:  1.  Atypical chest pain with negative cardiac enzymes but improved with SL NTG.  She had another episode this am.   2.  HTN 3.  GERD 4.  Nonobstructive ASCAD  With 60% diagonal by cath 02/2012 with stress myoview at that time abnormal with inferoapical reversibility 5.  New onset SOB over past month 6.  Palpitations 7.  LE edema - occuring over the past month although none on exam today  PLAN:   1.  She has ruled out for MI by serial enzymes and EKG shows nonspecific T wave abnormality.  Her symptoms resolved with SL NTG.  She had recurrent CP this am so recommend keeping in hospital and getting inpatient Lexiscan.  She is morbidly obese so I do not think she will be able to walk on the treadmill.  She will need a 2 day stress test.   2.  Will get an echo to assess LVF and diastolic function given her recent SOB which is new for her and complaints of LE edema 3.  Outpatient heart monitor  Sueanne Margarita, MD  09/16/2013  7:36 AM

## 2013-09-17 DIAGNOSIS — R072 Precordial pain: Secondary | ICD-10-CM

## 2013-09-17 DIAGNOSIS — I059 Rheumatic mitral valve disease, unspecified: Secondary | ICD-10-CM

## 2013-09-17 LAB — TSH: TSH: 1.185 u[IU]/mL (ref 0.350–4.500)

## 2013-09-17 LAB — LIPID PANEL
Cholesterol: 177 mg/dL (ref 0–200)
HDL: 57 mg/dL (ref >39)
LDL Cholesterol: 100 mg/dL — ABNORMAL HIGH (ref 0–99)
Total CHOL/HDL Ratio: 3.1 RATIO
Triglycerides: 98 mg/dL (ref <150)
VLDL: 20 mg/dL (ref 0–40)

## 2013-09-17 MED ORDER — TECHNETIUM TC 99M SESTAMIBI GENERIC - CARDIOLITE
30.0000 | Freq: Once | INTRAVENOUS | Status: AC | PRN
  Administered 2013-09-17: 30 via INTRAVENOUS

## 2013-09-17 MED ORDER — ATORVASTATIN CALCIUM 40 MG PO TABS: 40.0000 mg | ORAL_TABLET | Freq: Every day | ORAL | Status: DC

## 2013-09-17 MED ORDER — TECHNETIUM TC 99M SESTAMIBI GENERIC - CARDIOLITE
30.0000 | Freq: Once | INTRAVENOUS | Status: AC | PRN
  Administered 2013-09-16: 30 via INTRAVENOUS

## 2013-09-17 MED ORDER — ATORVASTATIN CALCIUM 40 MG PO TABS
40.0000 mg | ORAL_TABLET | Freq: Every day | ORAL | Status: DC
  Administered 2013-09-17: 40 mg via ORAL
  Filled 2013-09-17: qty 1

## 2013-09-17 MED ORDER — PANTOPRAZOLE SODIUM 40 MG PO TBEC: 40.0000 mg | DELAYED_RELEASE_TABLET | Freq: Every day | ORAL | Status: DC

## 2013-09-17 NOTE — Progress Notes (Signed)
Echocardiogram 2D Echocardiogram has been performed.  09/17/2013 10:17 AM Maudry Mayhew, RVT, RDCS, RDMS

## 2013-09-17 NOTE — Care Management Note (Signed)
    Page 1 of 1   09/17/2013     12:13:02 PM   CARE MANAGEMENT NOTE 09/17/2013  Patient:  Hannah Huerta, Hannah Huerta   Account Number:  [id number]  Date Initiated:  09/17/2013  Documentation initiated by:  GRAVES-BIGELOW,Nakima Fluegge  Subjective/Objective Assessment:   Pt admitted for cp. Plan for 2 day stress test.     Action/Plan:   Pt states she does not have PCP. CM did set pt up with the [facility] for f/u with MD Wall. 09-24-13. No further needs from CM at this time.   Anticipated DC Date:  09/18/2013   Anticipated DC Plan:  [facility]  Follow-up appt scheduled      Choice offered to / List presented to:             Status of service:  Completed, signed off Medicare Important Message given?   (If response is "NO", the following Medicare IM given date fields will be blank) Date Medicare IM given:   Date Additional Medicare IM given:    Discharge Disposition:  HOME/SELF CARE  Per UR Regulation:  Reviewed for med. necessity/level of care/duration of stay  If discussed at [place] of Stay Meetings, dates discussed:    Comments:

## 2013-09-17 NOTE — Discharge Summary (Signed)
Physician Discharge Summary  Hannah Huerta:[id number] DOB: 09/14/1960 DOA: 09/15/2013  PCP: No primary provider on file.  Admit date: 09/15/2013 Discharge date: 09/17/2013  Time spent: 65 minutes  Recommendations for Outpatient Follow-up:  1. Followup with Dr. Verl Blalock at the [facility] on 09/24/2013.  Discharge Diagnoses:  Principal Problem:   Chest pain Active Problems:   Morbid obesity with BMI of 50.0-59.9, adult   HTN (hypertension)   Hyperlipidemia   SOB (shortness of breath)   Palpitations   Discharge Condition: Stable and improved  Diet recommendation: Heart healthy  Filed Weights   09/15/13 1521 09/16/13 0005  Weight: 133.811 kg (295 lb) 137.077 kg (302 lb 3.2 oz)    History of present illness:  Hannah Huerta is a 53 y.o. female who presents to the ED with chest pain. Symptoms onset at 1530 with "locking up" of thumb and pointer finger on R hand (happens often). At same time developed lightheadedness, nausea, L sided chest "pain" described as palpitations "as if she were on a treadmill". Patient states she keeps feeling like she is choking   Hospital Course:  #1 chest pain This is a 53 year old lady history of obesity, hypertension hyperlipidemia blood presented to the ED with chest pain with some associated lightheadedness nausea. Patient's chest pain improved with nitroglycerin. Patient was admitted to a telemetry floor and acute coronary syndrome workup was undertaken. Cardiology consultation was also obtained and patient was seen in consultation by Dr. Radford Pax on 09/16/2013. Cardiac enzymes were cycled which were negative x3. EKG showed nonspecific T-wave abnormalities. Patient had recurrent chest pain was felt that patient needed an inpatient stress test. 2-D echo which was done on the EF of 55% with no wall motion abnormalities.  Patient underwent a two-day stress test which was negative for pharmacologic stress-induced ischemia with ejection fraction of  60%. Patient's chest pain improved and patient did not have any further chest pain on day of discharge. Patient be discharged in stable and improved condition. Patient be discharged on Protonix as well as Lipitor. Patient will followup with PCP as outpatient.  #2 hyperlipidemia Patient was noted to have a history of hyperlipidemia. Fasting lipid panel obtained and LDL of 100. Patient was started on Lipitor during this hospitalization and will followup as outpatient.    #3 hypertension Remained stable throughout the hospitalization. Patient will followup as outpatient.  The rest of patient's chronic medical issues remain stable throughout the hospitalization patient be discharged in stable and improved condition. Procedures:  2 day  nuclear medicine stress test 09/17/2013  Chest x-ray 09/15/2013  2 D echo 09/17/13  Consultations: Cardiology: Dr Radford Pax 09/16/13  Discharge Exam: Filed Vitals:   09/17/13 1424  BP: 139/70  Pulse: 60  Temp: 98 F (36.7 C)  Resp: 17    General: NAD Cardiovascular: RRR Respiratory: CTAB  Discharge Instructions      Discharge Orders   Future Appointments Provider Department Dept Phone   09/24/2013 9:00 AM Renella Cunas, MD [place] [phone]   Future Orders Complete By Expires   Diet - low sodium heart healthy  As directed    Discharge instructions  As directed    Comments:     Follow up with PCP in 1 week.   Increase activity slowly  As directed        Medication List         atorvastatin 40 MG tablet  Commonly known as:  LIPITOR  Take 1 tablet (  40 mg total) by mouth daily at 6 PM.     pantoprazole 40 MG tablet  Commonly known as:  PROTONIX  Take 1 tablet (40 mg total) by mouth daily.       No Known Allergies Follow-up Information   Follow up with [facility] On 09/24/2013. (@ 9:00  with MD Wall. )    Contact information:   [place]  [phone] [phone]       The results of significant diagnostics from this hospitalization (including imaging, microbiology, ancillary and laboratory) are listed below for reference.    Significant Diagnostic Studies: Dg Chest 2 View  09/15/2013   CLINICAL DATA:  Chest pain and cardiac palpitations  EXAM: CHEST  2 VIEW  COMPARISON:  March 25, 2012  FINDINGS: Lungs are clear. Heart size and pulmonary vascularity are normal. No adenopathy. No pneumothorax. There is mild degenerative change in the thoracic spine.  IMPRESSION: No edema or consolidation.   Electronically Signed   By: Lowella Grip M.D.   On: 09/15/2013 17:10   Nm Myocar Multi W/spect W/wall Motion / Ef  09/17/2013   CLINICAL DATA:  Chest pain, palpitations  EXAM: MYOCARDIAL IMAGING WITH SPECT (REST AND PHARMACOLOGIC-STRESS - 2 DAY PROTOCOL)  GATED LEFT VENTRICULAR WALL MOTION STUDY  LEFT VENTRICULAR EJECTION FRACTION  TECHNIQUE: Intravenous infusion of Lexiscan was performed under the supervision of the Cardiology staff. At peak effect of the drug, 30 mCi Tc-35m sestamibi was injected intravenously and standard myocardial SPECT imaging was performed. Subsequently, on a second day, standard myocardial SPECT imaging was performed after resting intravenous injection of 30 mCi Tc-50m sestamibi. Quantitative gated imaging was also performed to evaluate left ventricular wall motion, and estimate left ventricular ejection fraction.  COMPARISON:  03/07/2012  FINDINGS: The stress SPECT images demonstrate physiologic distribution of radiopharmaceutical. Rest images demonstrate no perfusion defects.  The gated stress SPECT images demonstrate normal left ventricular myocardial thickening. No focal wall motion abnormality is seen.  Calculated left ventricular end-diastolic volume 387 mL, end-systolic volume 55 mL, ejection fraction of 60%.  IMPRESSION: 1. Negative for pharmacologic-stress induced ischemia.  2. Left ventricular ejection fraction  60%.   Electronically Signed   By: Julian Hy M.D.   On: 09/17/2013 13:03    Microbiology: No results found for this or any previous visit (from the past 240 hour(s)).   Labs: Basic Metabolic Panel:  Recent Labs Lab 09/15/13 1524  NA 140  K 3.5*  CL 101  CO2 30  GLUCOSE 102*  BUN 12  CREATININE 0.82  CALCIUM 9.0   Liver Function Tests: No results found for this basename: AST, ALT, ALKPHOS, BILITOT, PROT, ALBUMIN,  in the last 168 hours No results found for this basename: LIPASE, AMYLASE,  in the last 168 hours No results found for this basename: AMMONIA,  in the last 168 hours CBC:  Recent Labs Lab 09/15/13 1524  WBC 4.1  HGB 10.9*  HCT 32.8*  MCV 75.9*  PLT 306   Cardiac Enzymes:  Recent Labs Lab 09/16/13 0403 09/16/13 1005 09/16/13 1505  TROPONINI <0.30 <0.30 <0.30   BNP: BNP (last 3 results) No results found for this basename: PROBNP,  in the last 8760 hours CBG: No results found for this basename: GLUCAP,  in the last 168 hours     Signed:  [facility] MD [facility] 09/17/2013, 4:02 PM

## 2013-09-17 NOTE — Progress Notes (Signed)
   Cardiologist: Dr. Percival Spanish (original consult 03/05/12) Subjective:  Feels better. No real CP this am. Had second part of stress test this morning.   Objective:  Vital Signs in the last 24 hours: Temp:  [97.8 F (36.6 C)-98.5 F (36.9 C)] 97.8 F (36.6 C) (03/04 0548) Pulse Rate:  [62-93] 62 (03/04 0548) Resp:  [18] 18 (03/04 0548) BP: (130-150)/(51-84) 150/78 mmHg (03/04 0548) SpO2:  [93 %-98 %] 93 % (03/04 0548)    Physical Exam: General: Well developed, well nourished, in no acute distress. Head:  Normocephalic and atraumatic. Lungs: Clear to auscultation and percussion. Heart: Normal S1 and S2.  No murmur, rubs or gallops.  Abdomen: soft, non-tender, positive bowel sounds. Obese Extremities: No clubbing or cyanosis. No edema. Neurologic: Alert and oriented x 3.    Lab Results:  Recent Labs  09/15/13 1524  WBC 4.1  HGB 10.9*  PLT 306    Recent Labs  09/15/13 1524  NA 140  K 3.5*  CL 101  CO2 30  GLUCOSE 102*  BUN 12  CREATININE 0.82    Recent Labs  09/16/13 1005 09/16/13 1505  TROPONINI <0.30 <0.30    Recent Labs  09/17/13 0425  CHOL 177   Imaging: Dg Chest 2 View  09/15/2013   CLINICAL DATA:  Chest pain and cardiac palpitations  EXAM: CHEST  2 VIEW  COMPARISON:  March 25, 2012  FINDINGS: Lungs are clear. Heart size and pulmonary vascularity are normal. No adenopathy. No pneumothorax. There is mild degenerative change in the thoracic spine.  IMPRESSION: No edema or consolidation.   Electronically Signed   By: Lowella Grip M.D.   On: 09/15/2013 17:10     Telemetry: NSR Personally viewed.   EKG:  NSR NSSTW changes  Cardiac Studies:  NUC pending. CATH 2013 - 60% diagonal not amenable to stenting due to small caliber.   Assessment/Plan:  Principal Problem:   Chest pain Active Problems:   Morbid obesity with BMI of 50.0-59.9, adult   HTN (hypertension)   Hyperlipidemia   SOB (shortness of breath)   Palpitations  -Await nuc  results.  -Would need to see significant ischemia change in order to proceed with cath given prior cath data.  -Weight loss -Will restart statin. Will choose atorvastatin 40mg .  -LDL goal 70.    Pink Maye, [place] 09/17/2013, 10:43 AM

## 2013-09-24 ENCOUNTER — Inpatient Hospital Stay: Payer: BC Managed Care – PPO | Admitting: Cardiology

## 2013-10-31 ENCOUNTER — Emergency Department (HOSPITAL_COMMUNITY): Payer: BC Managed Care – PPO

## 2013-10-31 ENCOUNTER — Encounter (HOSPITAL_COMMUNITY): Payer: Self-pay | Admitting: Emergency Medicine

## 2013-10-31 ENCOUNTER — Emergency Department (HOSPITAL_COMMUNITY)
Admission: EM | Admit: 2013-10-31 | Discharge: 2013-11-01 | Disposition: A | Discharge status: Home / Self Care | Payer: BC Managed Care – PPO | Attending: Emergency Medicine | Admitting: Emergency Medicine

## 2013-10-31 DIAGNOSIS — R079 Chest pain, unspecified: Secondary | ICD-10-CM | POA: Insufficient documentation

## 2013-10-31 DIAGNOSIS — N289 Disorder of kidney and ureter, unspecified: Secondary | ICD-10-CM | POA: Insufficient documentation

## 2013-10-31 DIAGNOSIS — R0602 Shortness of breath: Secondary | ICD-10-CM | POA: Insufficient documentation

## 2013-10-31 DIAGNOSIS — M7989 Other specified soft tissue disorders: Secondary | ICD-10-CM | POA: Insufficient documentation

## 2013-10-31 DIAGNOSIS — Z79899 Other long term (current) drug therapy: Secondary | ICD-10-CM | POA: Insufficient documentation

## 2013-10-31 DIAGNOSIS — I10 Essential (primary) hypertension: Secondary | ICD-10-CM | POA: Insufficient documentation

## 2013-10-31 DIAGNOSIS — R51 Headache: Secondary | ICD-10-CM | POA: Insufficient documentation

## 2013-10-31 DIAGNOSIS — Z8719 Personal history of other diseases of the digestive system: Secondary | ICD-10-CM | POA: Insufficient documentation

## 2013-10-31 DIAGNOSIS — R519 Headache, unspecified: Secondary | ICD-10-CM

## 2013-10-31 DIAGNOSIS — Z862 Personal history of diseases of the blood and blood-forming organs and certain disorders involving the immune mechanism: Secondary | ICD-10-CM | POA: Insufficient documentation

## 2013-10-31 LAB — PRO B NATRIURETIC PEPTIDE: Pro B Natriuretic peptide (BNP): 11.9 pg/mL (ref 0–125)

## 2013-10-31 LAB — BASIC METABOLIC PANEL
BUN: 26 mg/dL — ABNORMAL HIGH (ref 6–23)
CO2: 31 mEq/L (ref 19–32)
Calcium: 10 mg/dL (ref 8.4–10.5)
Chloride: 91 mEq/L — ABNORMAL LOW (ref 96–112)
Creatinine, Ser: 1.57 mg/dL — ABNORMAL HIGH (ref 0.50–1.10)
GFR calc Af Amer: 43 mL/min — ABNORMAL LOW (ref >90)
GFR calc non Af Amer: 37 mL/min — ABNORMAL LOW (ref >90)
Glucose, Bld: 123 mg/dL — ABNORMAL HIGH (ref 70–99)
Potassium: 3.5 mEq/L — ABNORMAL LOW (ref 3.7–5.3)
Sodium: 139 mEq/L (ref 137–147)

## 2013-10-31 LAB — CBC
HCT: 38.2 % (ref 36.0–46.0)
Hemoglobin: 13 g/dL (ref 12.0–15.0)
MCH: 25.5 pg — ABNORMAL LOW (ref 26.0–34.0)
MCHC: 34 g/dL (ref 30.0–36.0)
MCV: 74.9 fL — ABNORMAL LOW (ref 78.0–100.0)
Platelets: 353 10*3/uL (ref 150–400)
RBC: 5.1 MIL/uL (ref 3.87–5.11)
RDW: 14.8 % (ref 11.5–15.5)
WBC: 5.4 10*3/uL (ref 4.0–10.5)

## 2013-10-31 LAB — TROPONIN I: Troponin I: <0.3 ng/mL (ref <0.30)

## 2013-10-31 LAB — I-STAT TROPONIN, ED: Troponin i, poc: 0.01 ng/mL (ref 0.00–0.08)

## 2013-10-31 MED ORDER — ONDANSETRON HCL 4 MG/2ML IJ SOLN
4.0000 mg | Freq: Once | INTRAMUSCULAR | Status: AC
Start: 2013-10-31 — End: 2013-10-31
  Administered 2013-10-31: 4 mg via INTRAVENOUS
  Filled 2013-10-31: qty 2

## 2013-10-31 MED ORDER — NITROGLYCERIN 0.4 MG SL SUBL
0.4000 mg | SUBLINGUAL_TABLET | Freq: Once | SUBLINGUAL | Status: AC
  Administered 2013-10-31: 0.4 mg via SUBLINGUAL
  Filled 2013-10-31: qty 1

## 2013-10-31 MED ORDER — PANTOPRAZOLE SODIUM 20 MG PO TBEC: 20.0000 mg | DELAYED_RELEASE_TABLET | Freq: Every day | ORAL | Status: DC

## 2013-10-31 MED ORDER — ATORVASTATIN CALCIUM 40 MG PO TABS: 40.0000 mg | ORAL_TABLET | Freq: Every day | ORAL | Status: DC

## 2013-10-31 NOTE — ED Notes (Signed)
She states she was working this am noticed every time she bent forward she was dizzy and nauseated.then she began to have CP which has persisted since onset. She is is A&Ox4, resp e/u.

## 2013-10-31 NOTE — ED Provider Notes (Signed)
CSN: [id number]     Arrival date & time 10/31/13  1325 History   First MD Initiated Contact with Patient 10/31/13 1650     Chief Complaint  Patient presents with  . Chest Pain     (Consider location/radiation/quality/duration/timing/severity/associated sxs/prior Treatment) Patient is a 53 y.o. female presenting with chest pain. The history is provided by the patient.  Chest Pain Associated symptoms: shortness of breath   Associated symptoms: no abdominal pain, no back pain, no headache, no nausea, no numbness, not vomiting and no weakness    patient presents with chest pain. It is dull in the front of her chest. She states it began when she was bending over working. She states she still has some pain. No nausea vomiting. She's had some mild shortness of breath. She states she has some swelling in her legs. She states her legs are less swollen than they were before, because she took her husband's fluid pills.  Past Medical History  Diagnosis Date  . Hypertension   . GERD (gastroesophageal reflux disease)   . Anemia   . Sickle cell trait   . Morbid obesity with BMI of 50.0-59.9, adult   . Chest pain     a. 02/2012 abnl myoview - inferoapical reverisbility concerning for ischemia, EF 59%;   02/2012 nonobstructive cath   Past Surgical History  Procedure Laterality Date  . Cholecystectomy    . Tubal ligation    . Spine surgery      fusion   Family History  Problem Relation Age of Onset  . Coronary artery disease Father 78  . Sudden death Mother 15    Questionable MI   History  Substance Use Topics  . Smoking status: Never Smoker   . Smokeless tobacco: Not on file  . Alcohol Use: No   OB History   Grav Para Term Preterm Abortions TAB SAB Ect Mult Living                 Review of Systems  Constitutional: Negative for activity change and appetite change.  Eyes: Negative for pain.  Respiratory: Positive for shortness of breath. Negative for chest tightness.    Cardiovascular: Positive for chest pain and leg swelling.  Gastrointestinal: Negative for nausea, vomiting, abdominal pain and diarrhea.  Genitourinary: Negative for flank pain.  Musculoskeletal: Negative for back pain and neck stiffness.  Skin: Negative for rash.  Neurological: Negative for weakness, numbness and headaches.  Psychiatric/Behavioral: Negative for behavioral problems.      Allergies  Review of patient's allergies indicates no known allergies.  Home Medications   Prior to Admission medications   Medication Sig Start Date End Date Taking? Authorizing Provider  atorvastatin (LIPITOR) 40 MG tablet Take 1 tablet (40 mg total) by mouth daily at 6 PM. 09/17/13  Yes Eugenie Filler, MD   BP 126/66  Pulse 80  Temp(Src) 98.4 F (36.9 C) (Oral)  Resp 22  SpO2 96%  LMP 09/15/2013 Physical Exam  Nursing note and vitals reviewed. Constitutional: She is oriented to person, place, and time. She appears well-developed and well-nourished.  Patient is morbidly obese  HENT:  Head: Normocephalic and atraumatic.  Eyes: EOM are normal. Pupils are equal, round, and reactive to light.  Neck: Normal range of motion. Neck supple.  Cardiovascular: Normal rate, regular rhythm and normal heart sounds.   No murmur heard. Pulmonary/Chest: Effort normal and breath sounds normal. No respiratory distress. She has no wheezes. She has no rales.  Difficult examination due  to body habitus.  Abdominal: Soft. Bowel sounds are normal. She exhibits no distension. There is no tenderness. There is no rebound and no guarding.  Musculoskeletal: She exhibits edema.  Bilateral lower extremity pitting edema  Neurological: She is alert and oriented to person, place, and time. No cranial nerve deficit.  Skin: Skin is warm and dry.  Psychiatric: She has a normal mood and affect. Her speech is normal.    ED Course  Procedures (including critical care time) Labs Review Labs Reviewed  CBC - Abnormal;  Notable for the following:    MCV 74.9 (*)    MCH 25.5 (*)    All other components within normal limits  BASIC METABOLIC PANEL - Abnormal; Notable for the following:    Potassium 3.5 (*)    Chloride 91 (*)    Glucose, Bld 123 (*)    BUN 26 (*)    Creatinine, Ser 1.57 (*)    GFR calc non Af Amer 37 (*)    GFR calc Af Amer 43 (*)    All other components within normal limits  PRO B NATRIURETIC PEPTIDE  TROPONIN I  I-STAT TROPOININ, ED  I-STAT TROPOININ, ED    Imaging Review Dg Chest 2 View  10/31/2013   CLINICAL DATA:  Shortness of breath and chest pain  EXAM: CHEST  2 VIEW  COMPARISON:  September 15, 2013  FINDINGS: Lungs are clear. Heart size and pulmonary vascularity are normal. No adenopathy. No bone lesions. There is degenerative change in the thoracic spine.  IMPRESSION: No edema or consolidation.   Electronically Signed   By: Lowella Grip M.D.   On: 10/31/2013 17:52   Ct Head Wo Contrast  10/31/2013   CLINICAL DATA:  Frontal headache, dizziness.  EXAM: CT HEAD WITHOUT CONTRAST  TECHNIQUE: Contiguous axial images were obtained from the base of the skull through the vertex without intravenous contrast.  COMPARISON:  03/11/2009  FINDINGS: No acute intracranial abnormality. Specifically, no hemorrhage, hydrocephalus, mass lesion, acute infarction, or significant intracranial injury. No acute calvarial abnormality. Visualized paranasal sinuses and mastoids clear. Orbital soft tissues unremarkable.  IMPRESSION: Negative noncontrast head CT.   Electronically Signed   By: Rolm Baptise M.D.   On: 10/31/2013 23:47     EKG Interpretation   Date/Time:  Friday October 31 2013 13:31:23 EDT Ventricular Rate:  92 PR Interval:  144 QRS Duration: 86 QT Interval:  372 QTC Calculation: 460 R Axis:   -16 Text Interpretation:  Normal sinus rhythm Prolonged QT Abnormal ECG  Confirmed by Alvino Chapel  MD, Ovid Curd [phone]) on 10/31/2013 4:52:09 PM      MDM   Final diagnoses:  Chest pain  Renal  insufficiency  Headache    Patient with chest pain. EKG and enzymes are reassuring. This has renal insufficiency, but has been taking half of fluid pills. Will discharge home to followup with her doctors. Doubt DCS as the cause of the pain.    Jasper Riling. Alvino Chapel, MD 11/01/13 [phone]

## 2013-10-31 NOTE — ED Notes (Signed)
Patient transported to X-ray 

## 2013-11-01 MED ORDER — PROMETHAZINE HCL 25 MG PO TABS: 25.0000 mg | ORAL_TABLET | Freq: Four times a day (QID) | ORAL | Status: DC | PRN

## 2013-11-01 NOTE — Discharge Instructions (Signed)
Take phenergan as needed for nausea and with benadryl for headache pain.  Follow up with [facility] and neurologist.  Return to the ER if you have worsening headache or chest pain or any other symptoms that are concerning to you.  Chest Pain (Nonspecific) It is often hard to give a specific diagnosis for the cause of chest pain. There is always a chance that your pain could be related to something serious, such as a heart attack or a blood clot in the lungs. You need to follow up with your caregiver for further evaluation. CAUSES   Heartburn.  Pneumonia or bronchitis.  Anxiety or stress.  Inflammation around your heart (pericarditis) or lung (pleuritis or pleurisy).  A blood clot in the lung.  A collapsed lung (pneumothorax). It can develop suddenly on its own (spontaneous pneumothorax) or from injury (trauma) to the chest.  Shingles infection (herpes zoster virus). The chest wall is composed of bones, muscles, and cartilage. Any of these can be the source of the pain.  The bones can be bruised by injury.  The muscles or cartilage can be strained by coughing or overwork.  The cartilage can be affected by inflammation and become sore (costochondritis). DIAGNOSIS  Lab tests or other studies, such as X-rays, electrocardiography, stress testing, or cardiac imaging, may be needed to find the cause of your pain.  TREATMENT   Treatment depends on what may be causing your chest pain. Treatment may include:  Acid blockers for heartburn.  Anti-inflammatory medicine.  Pain medicine for inflammatory conditions.  Antibiotics if an infection is present.  You may be advised to change lifestyle habits. This includes stopping smoking and avoiding alcohol, caffeine, and chocolate.  You may be advised to keep your head raised (elevated) when sleeping. This reduces the chance of acid going backward from your stomach into your esophagus.  Most of the time, nonspecific chest pain  will improve within 2 to 3 days with rest and mild pain medicine. HOME CARE INSTRUCTIONS   If antibiotics were prescribed, take your antibiotics as directed. Finish them even if you start to feel better.  For the next few days, avoid physical activities that bring on chest pain. Continue physical activities as directed.  Do not smoke.  Avoid drinking alcohol.  Only take over-the-counter or prescription medicine for pain, discomfort, or fever as directed by your caregiver.  Follow your caregiver's suggestions for further testing if your chest pain does not go away.  Keep any follow-up appointments you made. If you do not go to an appointment, you could develop lasting (chronic) problems with pain. If there is any problem keeping an appointment, you must call to reschedule. SEEK MEDICAL CARE IF:   You think you are having problems from the medicine you are taking. Read your medicine instructions carefully.  Your chest pain does not go away, even after treatment.  You develop a rash with blisters on your chest. SEEK IMMEDIATE MEDICAL CARE IF:   You have increased chest pain or pain that spreads to your arm, neck, jaw, back, or abdomen.  You develop shortness of breath, an increasing cough, or you are coughing up blood.  You have severe back or abdominal pain, feel nauseous, or vomit.  You develop severe weakness, fainting, or chills.  You have a fever. THIS IS AN EMERGENCY. Do not wait to see if the pain will go away. Get medical help at once. Call your local emergency services (911 in [place] Do not drive yourself to  the hospital. MAKE SURE YOU:   Understand these instructions.  Will watch your condition.  Will get help right away if you are not doing well or get worse. Document Released: 04/12/2005 Document Revised: 09/25/2011 Document Reviewed: 02/06/2008 [facility] Patient Information 2014 [place].  Kidney Disease, Adult The kidneys are two organs that lie on  either side of the spine between the middle of the back and the front of the abdomen. The kidneys:   Remove wastes and extra water from the blood.   Produce important hormones. These regulate blood pressure, help keep bones strong, and help create red blood cells.   Balance the fluids and chemicals in the blood and tissues. Kidney disease occurs when the kidneys are damaged. Kidney damage may be sudden (acute) or develop over a long period (chronic). A small amount of damage may not cause problems, but a large amount of damage may make it difficult or impossible for the kidneys to work the way they should. Early detection and treatment of kidney disease may prevent kidney damage from becoming permanent or getting worse. Some kidney diseases are curable, but most are not. Many people with kidney disease are able to control the disease and live a normal life.  TYPES OF KIDNEY DISEASE  Acute kidney injury.Acute kidney injury occurs when there is sudden damage to the kidneys.  Chronic kidney disease. Chronic kidney disease occurs when the kidneys are damaged over a long period.  End-stage kidney disease. End-stage kidney disease occurs when the kidneys are so damaged that they stop working. In end-stage kidney disease, the kidneys cannot get better. CAUSES Any condition, disease, or event that damages the kidneys may cause kidney disease. Acute kidney injury.  A problem with blood flow to the kidneys. This may be caused by:   Blood loss.   Heart disease.   Severe burns.   Liver disease.  Direct damage to the kidneys. This may be caused by:  Some medicines.   A kidney infection.   Poisoning or consuming toxic substances.   A surgical wound.   A blow to the kidney area.   A problem with urine flow. This may be caused by:   Cancer.   Kidney stones.   An enlarged prostate. Chronic kidney disease. The most common causes of chronic kidney disease are diabetes and  high blood pressure (hypertension). Chronic kidney disease may also be caused by:   Diseases that cause the filtering units of the kidneys to become inflamed.   Diseases that affect the immune system.   Genetic diseases.   Medicines that damage the kidneys, such as anti-inflammatory medicines.  Poisoning or exposure to toxic substances.   A reoccurring kidney or urinary infection.   A problem with urine flow. This may be caused by:  Cancer.   Kidney stones.   An enlarged prostate in males. End-stage kidney disease. This kidney disease usually occurs when a chronic kidney disease gets worse. It may also occur after acute kidney injury.  SYMPTOMS   Swelling (edema) of the legs, ankles, or feet.   Tiredness (lethargy).   Nausea or vomiting.   Confusion.   Problems with urination, such as:   Painful or burning feeling during urination.   Decreased urine production.  Bloody urine.   Frequent urination, especially at night.  Hypertension.  Muscle twitches and cramps.   Shortness of breath.   Persistent itchiness.   Loss of appetite.  Metallic taste in the mouth.   Weakness.   Seizures.  Chest pain or pressure.   Trouble sleeping.   Headaches.   Abnormally dark or light skin.   Numbness in the hands or feet.   Easy bruising.   Frequent hiccups.   Menstruation stops. Sometimes, no symptoms are present. DIAGNOSIS  Kidney disease may be detected and diagnosed by tests, including blood, urine, imaging, or kidney biopsy tests.  TREATMENT  Acute kidney injury. Treatment of acute kidney injury varies depending on the cause and severity of the kidney damage. In mild cases, no treatment may be needed. The kidneys may heal on their own. If acute kidney injury is more severe, your caregiver will treat the cause of the kidney damage, help the kidneys heal, and prevent complications from occurring. Severe cases may require a  procedure to remove toxic wastes from the body (dialysis) or surgery to repair kidney damage. Surgery may involve:   Repair of a torn kidney.   Removal of an obstruction.  Most of the time, you will need to stay overnight at the hospital.  Chronic kidney disease. Most chronic kidney diseases cannot be cured. Treatment usually involves relieving symptoms and preventing or slowing the progression of the disease. Treatment may include:   A special diet. You may need to avoid alcohol and foods that:   Have added salt.   Are high in potassium.   Are high in protein.   Medicines. These may:   Lower blood pressure.   Relieve anemia.   Relieve swelling.   Protect the bones.  End-stage kidney disease. End-stage kidney disease is life-threatening and must be treated immediately. There are two treatments for end-stage kidney disease:   Dialysis.   Receiving a new kidney (kidney transplant). Both of these treatments have serious risks and consequences. In addition to having dialysis or a kidney transplant, you may need to take medicines to control hypertension and cholesterol and to decrease phosphorus levels in your blood. LENGTH OF ILLNESS  Acute kidney injury.The length of this disease varies greatly from person to person. Exactly how long it lasts depends on the cause of the kidney damage. Acute kidney injury may develop into chronic kidney disease or end-stage kidney disease.  Chronic kidney disease. This disease usually lasts a lifetime. Chronic kidney disease may worsen over time to become end-stage kidney disease. The time it takes for end-stage kidney disease to develop varies from person to person.  End-stage kidney disease. This disease lasts until a kidney transplant is performed. PREVENTION  Kidney disease can sometimes be prevented. If you have diabetes, hypertension, or any other condition that may lead to kidney disease, you should try to prevent kidney disease  with:   An appropriate diet.  Medicine.  Lifestyle changes. FOR MORE INFORMATION  American Association of Kidney Patients: BombTimer.gl  National Kidney Foundation: www.kidney.[place]: https://mathis.com/  [facility]: www.lifeoptions.org and www.kidneyschool.org  Document Released: 07/03/2005 Document Revised: 06/19/2012 Document Reviewed: 03/01/2012 [facility] Patient Information 2014 [place], [place].

## 2013-11-01 NOTE — ED Notes (Signed)
Pt A&OX4, wheeled out of ED via wheelchair, verbalizing no complaints at this time.  

## 2013-11-01 NOTE — ED Provider Notes (Signed)
Pt received from Dr. Alvino Chapel.  Pt presented to ED w/ cc CP.  Delta troponin was pending at time of hand off and plan was to d/c home if negative.  Troponin neg, but upon discussed discharge plan with patient, she mentioned new onset, intermittent, severe headaches x 1-2 weeks, occasional L-sided visual impairment, LLE paresthesias w/ giving way of knee and confusion, described as not being to get her thoughts together.  Asx currently and no focal neuro deficits on exam (CN 3-12 intact, no sensory deficits, 5/5 and equal upper and lower extremity strength, no past pointing.) CT head obtained and non-acute.  Pt reassured but referred to neuro for further evaluation.  I advised f/u with [facility] for chest pain as well.  Return precautions discussed.   Remer Macho, PA-C 11/01/13 2256

## 2013-11-02 NOTE — ED Provider Notes (Signed)
Medical screening examination/treatment/procedure(s) were conducted as a shared visit with non-physician practitioner(s) and myself.  I personally evaluated the patient during the encounter.   EKG Interpretation   Date/Time:  Friday October 31 2013 13:31:23 EDT Ventricular Rate:  92 PR Interval:  144 QRS Duration: 86 QT Interval:  372 QTC Calculation: 460 R Axis:   -16 Text Interpretation:  Normal sinus rhythm Prolonged QT Abnormal ECG  Confirmed by Alvino Chapel  MD, Tamre Cass [id number]) on 10/31/2013 4:52:09 PM       Jasper Riling. Alvino Chapel, MD 11/02/13 1057

## 2013-11-12 ENCOUNTER — Inpatient Hospital Stay: Payer: BC Managed Care – PPO | Admitting: Cardiology

## 2013-11-20 ENCOUNTER — Encounter: Payer: Self-pay | Admitting: Women's Health

## 2013-11-20 ENCOUNTER — Ambulatory Visit (INDEPENDENT_AMBULATORY_CARE_PROVIDER_SITE_OTHER): Payer: BC Managed Care – PPO | Admitting: Women's Health

## 2013-11-20 ENCOUNTER — Other Ambulatory Visit (HOSPITAL_COMMUNITY)
Admission: RE | Admit: 2013-11-20 | Discharge: 2013-11-20 | Disposition: A | Discharge status: Home / Self Care | Payer: BC Managed Care – PPO | Source: Ambulatory Visit | Attending: Women's Health | Admitting: Women's Health

## 2013-11-20 VITALS — BP 134/84 | Ht 62.0 in | Wt 297.0 lb

## 2013-11-20 DIAGNOSIS — Z01419 Encounter for gynecological examination (general) (routine) without abnormal findings: Secondary | ICD-10-CM

## 2013-11-20 DIAGNOSIS — Z1151 Encounter for screening for human papillomavirus (HPV): Secondary | ICD-10-CM | POA: Insufficient documentation

## 2013-11-20 NOTE — Progress Notes (Signed)
Hannah Huerta 1961-04-04 [id number]    History:    Presents for annual exam.  Irregular cycles this past year, had a cycle in March, prior to that 5-6 months, year prior regular monthly cycle. BTL. Numerous hot flushes this past year.Normal mammogram 2013.  Heart disease, heart catheterization 2013, blood clot left leg 2005. Hypercholesteremia primary care manages labs and meds. Reports normal Pap history, none for many years.   Past medical history, past surgical history, family history and social history were all reviewed and documented in the EPIC chart. Works at [employer] in [place], out on leave of absence. Has been numerous health problems on disability. 3 children all doing well ages 80, 63, and 58.  ROS:  A  12 point ROS was performed and pertinent positives and negatives are included.  Exam:  Filed Vitals:   11/20/13 1157  BP: 134/84    General appearance:  Morbid obesity Thyroid:  Symmetrical, normal in size, without palpable masses or nodularity. Respiratory  Auscultation:  Clear without wheezing or rhonchi Cardiovascular  Auscultation:  Regular rate, without rubs, murmurs or gallops  Edema/varicosities:  Not grossly evident Abdominal  Soft,nontender, without masses, guarding or rebound.  Liver/spleen:  No organomegaly noted  Hernia:  None appreciated  Skin  Inspection:  Grossly normal   Breasts: Examined lying and sitting. Pendulous    Right: Without masses, retractions, discharge or axillary adenopathy.     Left: Without masses, retractions, discharge or axillary adenopathy. Gentitourinary   Inguinal/mons:  Normal without inguinal adenopathy  External genitalia:  Normal  BUS/Urethra/Skene's glands:  Normal  Vagina:  Normal  Cervix:  Normal  Uterus:   normal in size, shape and contour.  Midline and mobile  Adnexa/parametria:     Rt: Without masses or tenderness.   Lt: Without masses or tenderness.  Anus and perineum: Normal  Digital rectal exam: Normal  sphincter tone without palpated masses or tenderness  Assessment/Plan:  53 y.o. MBF G5P3 for annual exam.     Perimenopausal/BTL Heart disease/hypercholesterolemia cardiologist manages labs and meds 2005 left leg blood clot Morbid obesity  Plan: Normal mammogram 2013, instructed to schedule, reviewed importance of annual screen and SBE's. Regular exercise, calcium rich diet, vitamin D 2000 daily encouraged. Aware of importance of weight loss for health, decreasing calories, Weight Watchers, weight loss surgery discussed. Menopause reviewed, reviewed HRT not safe with clot history. Has not had a colonoscopy, instructed to discuss with her cardiologist to coordinate. Pap with HR HPV typing.    Huel Cote [facility], 12:36 PM 11/20/2013

## 2013-11-20 NOTE — Addendum Note (Signed)
Addended by: Alen Blew on: 11/20/2013 01:01 PM   Modules accepted: Orders

## 2013-11-20 NOTE — Patient Instructions (Signed)
Health Recommendations for Postmenopausal Women Respected and ongoing research has looked at the most common causes of death, disability, and poor quality of life in postmenopausal women. The causes include heart disease, diseases of blood vessels, diabetes, depression, cancer, and bone loss (osteoporosis). Many things can be done to help lower the chances of developing these and other common problems: CARDIOVASCULAR DISEASE Heart Disease: A heart attack is a medical emergency. Know the signs and symptoms of a heart attack. Below are things women can do to reduce their risk for heart disease.   Do not smoke. If you smoke, quit.  Aim for a healthy weight. Being overweight causes many preventable deaths. Eat a healthy and balanced diet and drink an adequate amount of liquids.  Get moving. Make a commitment to be more physically active. Aim for 30 minutes of activity on most, if not all days of the week.  Eat for heart health. Choose a diet that is low in saturated fat and cholesterol and eliminate trans fat. Include whole grains, vegetables, and fruits. Read and understand the labels on food containers before buying.  Know your numbers. Ask your caregiver to check your blood pressure, cholesterol (total, HDL, LDL, triglycerides) and blood glucose. Work with your caregiver on improving your entire clinical picture.  High blood pressure. Limit or stop your table salt intake (try salt substitute and food seasonings). Avoid salty foods and drinks. Read labels on food containers before buying. Eating well and exercising can help control high blood pressure. STROKE  Stroke is a medical emergency. Stroke may be the result of a blood clot in a blood vessel in the brain or by a brain hemorrhage (bleeding). Know the signs and symptoms of a stroke. To lower the risk of developing a stroke:  Avoid fatty foods.  Quit smoking.  Control your diabetes, blood pressure, and irregular heart rate. THROMBOPHLEBITIS  (BLOOD CLOT) OF THE LEG  Becoming overweight and leading a stationary lifestyle may also contribute to developing blood clots. Controlling your diet and exercising will help lower the risk of developing blood clots. CANCER SCREENING  Breast Cancer: Take steps to reduce your risk of breast cancer.  You should practice "breast self-awareness." This means understanding the normal appearance and feel of your breasts and should include breast self-examination. Any changes detected, no matter how small, should be reported to your caregiver.  After age 40, you should have a clinical breast exam (CBE) every year.  Starting at age 40, you should consider having a mammogram (breast X-ray) every year.  If you have a family history of breast cancer, talk to your caregiver about genetic screening.  If you are at high risk for breast cancer, talk to your caregiver about having an MRI and a mammogram every year.  Intestinal or Stomach Cancer: Tests to consider are a rectal exam, fecal occult blood, sigmoidoscopy, and colonoscopy. Women who are high risk may need to be screened at an earlier age and more often.  Cervical Cancer:  Beginning at age 30, you should have a Pap test every 3 years as long as the past 3 Pap tests have been normal.  If you have had past treatment for cervical cancer or a condition that could lead to cancer, you need Pap tests and screening for cancer for at least 20 years after your treatment.  If you had a hysterectomy for a problem that was not cancer or a condition that could lead to cancer, then you no longer need Pap tests.    If you are between ages 65 and 70, and you have had normal Pap tests going back 10 years, you no longer need Pap tests.  If Pap tests have been discontinued, risk factors (such as a new sexual partner) need to be reassessed to determine if screening should be resumed.  Some medical problems can increase the chance of getting cervical cancer. In these  cases, your caregiver may recommend more frequent screening and Pap tests.  Uterine Cancer: If you have vaginal bleeding after reaching menopause, you should notify your caregiver.  Ovarian cancer: Other than yearly pelvic exams, there are no reliable tests available to screen for ovarian cancer at this time except for yearly pelvic exams.  Lung Cancer: Yearly chest X-rays can detect lung cancer and should be done on high risk women, such as cigarette smokers and women with chronic lung disease (emphysema).  Skin Cancer: A complete body skin exam should be done at your yearly examination. Avoid overexposure to the sun and ultraviolet light lamps. Use a strong sun block cream when in the sun. All of these things are important in lowering the risk of skin cancer. MENOPAUSE Menopause Symptoms: Hormone therapy products are effective for treating symptoms associated with menopause:  Moderate to severe hot flashes.  Night sweats.  Mood swings.  Headaches.  Tiredness.  Loss of sex drive.  Insomnia.  Other symptoms. Hormone replacement carries certain risks, especially in older women. Women who use or are thinking about using estrogen or estrogen with progestin treatments should discuss that with their caregiver. Your caregiver will help you understand the benefits and risks. The ideal dose of hormone replacement therapy is not known. The Food and Drug Administration (FDA) has concluded that hormone therapy should be used only at the lowest doses and for the shortest amount of time to reach treatment goals.  OSTEOPOROSIS Protecting Against Bone Loss and Preventing Fracture: If you use hormone therapy for prevention of bone loss (osteoporosis), the risks for bone loss must outweigh the risk of the therapy. Ask your caregiver about other medications known to be safe and effective for preventing bone loss and fractures. To guard against bone loss or fractures, the following is recommended:  If  you are less than age 50, take 1000 mg of calcium and at least 600 mg of Vitamin D per day.  If you are greater than age 50 but less than age 70, take 1200 mg of calcium and at least 600 mg of Vitamin D per day.  If you are greater than age 70, take 1200 mg of calcium and at least 800 mg of Vitamin D per day. Smoking and excessive alcohol intake increases the risk of osteoporosis. Eat foods rich in calcium and vitamin D and do weight bearing exercises several times a week as your caregiver suggests. DIABETES Diabetes Melitus: If you have Type I or Type 2 diabetes, you should keep your blood sugar under control with diet, exercise and recommended medication. Avoid too many sweets, starchy and fatty foods. Being overweight can make control more difficult. COGNITION AND MEMORY Cognition and Memory: Menopausal hormone therapy is not recommended for the prevention of cognitive disorders such as Alzheimer's disease or memory loss.  DEPRESSION  Depression may occur at any age, but is common in elderly women. The reasons may be because of physical, medical, social (loneliness), or financial problems and needs. If you are experiencing depression because of medical problems and control of symptoms, talk to your caregiver about this. Physical activity and   exercise may help with mood and sleep. Community and volunteer involvement may help your sense of value and worth. If you have depression and you feel that the problem is getting worse or becoming severe, talk to your caregiver about treatment options that are best for you. ACCIDENTS  Accidents are common and can be serious in the elderly woman. Prepare your house to prevent accidents. Eliminate throw rugs, place hand bars in the bath, shower and toilet areas. Avoid wearing high heeled shoes or walking on wet, snowy, and icy areas. Limit or stop driving if you have vision or hearing problems, or you feel you are unsteady with you movements and  reflexes. HEPATITIS C Hepatitis C is a type of viral infection affecting the liver. It is spread mainly through contact with blood from an infected person. It can be treated, but if left untreated, it can lead to severe liver damage over years. Many people who are infected do not know that the virus is in their blood. If you are a "baby-boomer", it is recommended that you have one screening test for Hepatitis C. IMMUNIZATIONS  Several immunizations are important to consider having during your senior years, including:   Tetanus, diptheria, and pertussis booster shot.  Influenza every year before the flu season begins.  Pneumonia vaccine.  Shingles vaccine.  Others as indicated based on your specific needs. Talk to your caregiver about these. Document Released: 08/25/2005 Document Revised: 06/19/2012 Document Reviewed: 04/20/2008 [employer] Patient Information 2014 [employer].  

## 2013-11-26 ENCOUNTER — Encounter: Payer: Self-pay | Admitting: Cardiology

## 2013-11-26 ENCOUNTER — Ambulatory Visit: Payer: BC Managed Care – PPO | Attending: Cardiology | Admitting: Cardiology

## 2013-11-26 VITALS — BP 146/84 | HR 79 | Temp 98.9°F | Resp 20 | Ht 62.0 in | Wt 299.0 lb

## 2013-11-26 DIAGNOSIS — R0602 Shortness of breath: Secondary | ICD-10-CM

## 2013-11-26 DIAGNOSIS — Z6841 Body Mass Index (BMI) 40.0 and over, adult: Secondary | ICD-10-CM

## 2013-11-26 DIAGNOSIS — R072 Precordial pain: Secondary | ICD-10-CM

## 2013-11-26 DIAGNOSIS — R5383 Other fatigue: Secondary | ICD-10-CM

## 2013-11-26 DIAGNOSIS — R5381 Other malaise: Secondary | ICD-10-CM | POA: Insufficient documentation

## 2013-11-26 NOTE — Patient Instructions (Signed)
There is no need to follow-up with Dr. Verl Blalock.  Please follow-up with Primary Care Physician as needed.  Thanks!

## 2013-11-26 NOTE — Assessment & Plan Note (Signed)
This is noncardiac. Please review the extensive evaluation in the hospital as reviewed in my history of present illness. I have reassured her that she most likely has gastroesophageal reflux and possible spasm and start her proton pump inhibitor daily. She has prescription with her today. She's gotten relief one time with nitroglycerin but I advised her not to take this because of side effects. His discomfort is intolerable, she  can take as needed. As I related to her, this will relieve esophageal spasm. No further cardiac workup.

## 2013-11-26 NOTE — Progress Notes (Signed)
Patient here for Cardiology follow-up. Patient in ED 10/31/13-11/01/13 for chest pain. Patient not currently having chest pain. She does indicate she currently has a headache-pain 6/10 and indicates she has a lack of energy and shortness of breath at times. BP 146/84 today. Patient taking Lipitor, and she indicates she is not on any BP medications.

## 2013-11-26 NOTE — Progress Notes (Signed)
Hannah Huerta returns today after being discharged the hospital with chest pain. She continues to have sharp stabbing pain that comes and goes midsternal not associated with activity. She also continues to have some chest pressure. She is easily fatigued and short of breath with activity.  In the hospital, cardiac enzymes were negative, EKG was normal which I reviewed, echocardiogram showed ejection fraction of 60% with normal Kahari Critzer motion, and stress Myoview was negative for ischemia.  She was given a prescription for a proton pump inhibitor but has not gotten it filled.  Exam not done today.

## 2013-12-01 ENCOUNTER — Ambulatory Visit: Payer: BC Managed Care – PPO | Admitting: Neurology

## 2013-12-19 ENCOUNTER — Encounter: Payer: Self-pay | Admitting: Neurology

## 2013-12-19 ENCOUNTER — Telehealth: Payer: Self-pay | Admitting: Neurology

## 2013-12-19 ENCOUNTER — Ambulatory Visit: Payer: BC Managed Care – PPO | Admitting: Neurology

## 2013-12-19 NOTE — Telephone Encounter (Signed)
Pt has no showed 2 new patient appts with our office. We will be unable to schedule another new pt with our office due to repeated np no shows. No show letter mailed to pt / Sherri S.

## 2014-04-27 ENCOUNTER — Emergency Department (HOSPITAL_COMMUNITY): Payer: BC Managed Care – PPO

## 2014-04-27 ENCOUNTER — Observation Stay (HOSPITAL_COMMUNITY): Payer: BC Managed Care – PPO

## 2014-04-27 ENCOUNTER — Encounter (HOSPITAL_COMMUNITY): Payer: Self-pay | Admitting: Emergency Medicine

## 2014-04-27 ENCOUNTER — Observation Stay (HOSPITAL_COMMUNITY)
Admission: EM | Admit: 2014-04-27 | Discharge: 2014-04-28 | Disposition: A | Discharge status: Home / Self Care | Payer: BC Managed Care – PPO | Attending: Oncology | Admitting: Oncology

## 2014-04-27 DIAGNOSIS — R0789 Other chest pain: Secondary | ICD-10-CM | POA: Diagnosis present

## 2014-04-27 DIAGNOSIS — G43109 Migraine with aura, not intractable, without status migrainosus: Secondary | ICD-10-CM | POA: Diagnosis not present

## 2014-04-27 DIAGNOSIS — Z79899 Other long term (current) drug therapy: Secondary | ICD-10-CM | POA: Diagnosis not present

## 2014-04-27 DIAGNOSIS — G43009 Migraine without aura, not intractable, without status migrainosus: Secondary | ICD-10-CM

## 2014-04-27 DIAGNOSIS — E119 Type 2 diabetes mellitus without complications: Secondary | ICD-10-CM | POA: Insufficient documentation

## 2014-04-27 DIAGNOSIS — K219 Gastro-esophageal reflux disease without esophagitis: Secondary | ICD-10-CM | POA: Diagnosis not present

## 2014-04-27 DIAGNOSIS — R208 Other disturbances of skin sensation: Secondary | ICD-10-CM | POA: Diagnosis not present

## 2014-04-27 DIAGNOSIS — R2990 Unspecified symptoms and signs involving the nervous system: Secondary | ICD-10-CM | POA: Diagnosis present

## 2014-04-27 DIAGNOSIS — R519 Headache, unspecified: Secondary | ICD-10-CM

## 2014-04-27 DIAGNOSIS — G4733 Obstructive sleep apnea (adult) (pediatric): Secondary | ICD-10-CM | POA: Insufficient documentation

## 2014-04-27 DIAGNOSIS — Z6841 Body Mass Index (BMI) 40.0 and over, adult: Secondary | ICD-10-CM | POA: Insufficient documentation

## 2014-04-27 DIAGNOSIS — D509 Iron deficiency anemia, unspecified: Secondary | ICD-10-CM | POA: Diagnosis not present

## 2014-04-27 DIAGNOSIS — D573 Sickle-cell trait: Secondary | ICD-10-CM | POA: Diagnosis not present

## 2014-04-27 DIAGNOSIS — R51 Headache: Secondary | ICD-10-CM

## 2014-04-27 DIAGNOSIS — R079 Chest pain, unspecified: Secondary | ICD-10-CM | POA: Diagnosis not present

## 2014-04-27 DIAGNOSIS — IMO0002 Reserved for concepts with insufficient information to code with codable children: Secondary | ICD-10-CM | POA: Diagnosis present

## 2014-04-27 DIAGNOSIS — I10 Essential (primary) hypertension: Secondary | ICD-10-CM | POA: Diagnosis present

## 2014-04-27 DIAGNOSIS — D649 Anemia, unspecified: Secondary | ICD-10-CM | POA: Diagnosis not present

## 2014-04-27 DIAGNOSIS — E785 Hyperlipidemia, unspecified: Secondary | ICD-10-CM | POA: Diagnosis not present

## 2014-04-27 DIAGNOSIS — Z7982 Long term (current) use of aspirin: Secondary | ICD-10-CM | POA: Diagnosis not present

## 2014-04-27 DIAGNOSIS — R200 Anesthesia of skin: Secondary | ICD-10-CM

## 2014-04-27 DIAGNOSIS — R202 Paresthesia of skin: Secondary | ICD-10-CM | POA: Diagnosis present

## 2014-04-27 DIAGNOSIS — G43709 Chronic migraine without aura, not intractable, without status migrainosus: Secondary | ICD-10-CM | POA: Diagnosis present

## 2014-04-27 LAB — COMPREHENSIVE METABOLIC PANEL
ALT: 13 U/L (ref 0–35)
AST: 22 U/L (ref 0–37)
Albumin: 3.4 g/dL — ABNORMAL LOW (ref 3.5–5.2)
Alkaline Phosphatase: 79 U/L (ref 39–117)
Anion gap: 12 (ref 5–15)
BUN: 15 mg/dL (ref 6–23)
CO2: 31 mEq/L (ref 19–32)
Calcium: 9.4 mg/dL (ref 8.4–10.5)
Chloride: 99 mEq/L (ref 96–112)
Creatinine, Ser: 0.88 mg/dL (ref 0.50–1.10)
GFR calc Af Amer: 85 mL/min — ABNORMAL LOW (ref >90)
GFR calc non Af Amer: 74 mL/min — ABNORMAL LOW (ref >90)
Glucose, Bld: 104 mg/dL — ABNORMAL HIGH (ref 70–99)
Potassium: 3.8 mEq/L (ref 3.7–5.3)
Sodium: 142 mEq/L (ref 137–147)
Total Bilirubin: 0.2 mg/dL — ABNORMAL LOW (ref 0.3–1.2)
Total Protein: 7.9 g/dL (ref 6.0–8.3)

## 2014-04-27 LAB — URINALYSIS, ROUTINE W REFLEX MICROSCOPIC
Bilirubin Urine: NEGATIVE
Glucose, UA: NEGATIVE mg/dL
Hgb urine dipstick: NEGATIVE
Ketones, ur: NEGATIVE mg/dL
Leukocytes, UA: NEGATIVE
Nitrite: NEGATIVE
Protein, ur: NEGATIVE mg/dL
Specific Gravity, Urine: 1.016 (ref 1.005–1.030)
Urobilinogen, UA: 0.2 mg/dL (ref 0.0–1.0)
pH: 7 (ref 5.0–8.0)

## 2014-04-27 LAB — RAPID URINE DRUG SCREEN, HOSP PERFORMED
Amphetamines: NOT DETECTED
Barbiturates: NOT DETECTED
Benzodiazepines: NOT DETECTED
Cocaine: NOT DETECTED
Opiates: NOT DETECTED
Tetrahydrocannabinol: NOT DETECTED

## 2014-04-27 LAB — TROPONIN I
Troponin I: <0.3 ng/mL (ref <0.30)
Troponin I: <0.3 ng/mL (ref <0.30)

## 2014-04-27 LAB — DIFFERENTIAL
Basophils Absolute: 0 10*3/uL (ref 0.0–0.1)
Basophils Relative: 0 % (ref 0–1)
Eosinophils Absolute: 0.1 10*3/uL (ref 0.0–0.7)
Eosinophils Relative: 3 % (ref 0–5)
Lymphocytes Relative: 22 % (ref 12–46)
Lymphs Abs: 0.9 10*3/uL (ref 0.7–4.0)
Monocytes Absolute: 0.6 10*3/uL (ref 0.1–1.0)
Monocytes Relative: 14 % — ABNORMAL HIGH (ref 3–12)
Neutro Abs: 2.6 10*3/uL (ref 1.7–7.7)
Neutrophils Relative %: 61 % (ref 43–77)

## 2014-04-27 LAB — ETHANOL: Alcohol, Ethyl (B): <11 mg/dL (ref 0–11)

## 2014-04-27 LAB — CBC
HCT: 35.1 % — ABNORMAL LOW (ref 36.0–46.0)
Hemoglobin: 11.4 g/dL — ABNORMAL LOW (ref 12.0–15.0)
MCH: 24.4 pg — ABNORMAL LOW (ref 26.0–34.0)
MCHC: 32.5 g/dL (ref 30.0–36.0)
MCV: 75.2 fL — ABNORMAL LOW (ref 78.0–100.0)
Platelets: 285 10*3/uL (ref 150–400)
RBC: 4.67 MIL/uL (ref 3.87–5.11)
RDW: 14.9 % (ref 11.5–15.5)
WBC: 4.3 10*3/uL (ref 4.0–10.5)

## 2014-04-27 LAB — I-STAT TROPONIN, ED
Troponin i, poc: 0 ng/mL (ref 0.00–0.08)
Troponin i, poc: 0 ng/mL (ref 0.00–0.08)

## 2014-04-27 MED ORDER — ENOXAPARIN SODIUM 40 MG/0.4ML ~~LOC~~ SOLN
40.0000 mg | SUBCUTANEOUS | Status: DC
  Administered 2014-04-27: 40 mg via SUBCUTANEOUS
  Filled 2014-04-27: qty 0.4

## 2014-04-27 MED ORDER — DEXAMETHASONE SODIUM PHOSPHATE 4 MG/ML IJ SOLN
4.0000 mg | Freq: Once | INTRAMUSCULAR | Status: AC
  Administered 2014-04-27: 4 mg via INTRAVENOUS
  Filled 2014-04-27: qty 1

## 2014-04-27 MED ORDER — ONDANSETRON HCL 4 MG/2ML IJ SOLN
4.0000 mg | Freq: Three times a day (TID) | INTRAMUSCULAR | Status: AC | PRN
  Administered 2014-04-27: 4 mg via INTRAVENOUS
  Filled 2014-04-27: qty 2

## 2014-04-27 MED ORDER — PROCHLORPERAZINE EDISYLATE 5 MG/ML IJ SOLN
10.0000 mg | Freq: Once | INTRAMUSCULAR | Status: AC
  Administered 2014-04-27: 10 mg via INTRAVENOUS
  Filled 2014-04-27: qty 2

## 2014-04-27 MED ORDER — PNEUMOCOCCAL VAC POLYVALENT 25 MCG/0.5ML IJ INJ
0.5000 mL | INJECTION | INTRAMUSCULAR | Status: AC
  Administered 2014-04-28: 0.5 mL via INTRAMUSCULAR

## 2014-04-27 MED ORDER — ACETAMINOPHEN 325 MG PO TABS: 650.0000 mg | ORAL_TABLET | Freq: Four times a day (QID) | ORAL | Status: DC | PRN

## 2014-04-27 MED ORDER — SODIUM CHLORIDE 0.9 % IV BOLUS (SEPSIS)
500.0000 mL | Freq: Once | INTRAVENOUS | Status: AC
  Administered 2014-04-27: 500 mL via INTRAVENOUS

## 2014-04-27 MED ORDER — ENOXAPARIN SODIUM 40 MG/0.4ML ~~LOC~~ SOLN: 40.0000 mg | SUBCUTANEOUS | Status: DC

## 2014-04-27 MED ORDER — KETOROLAC TROMETHAMINE 30 MG/ML IJ SOLN
30.0000 mg | Freq: Once | INTRAMUSCULAR | Status: AC
  Administered 2014-04-27: 30 mg via INTRAVENOUS
  Filled 2014-04-27: qty 1

## 2014-04-27 MED ORDER — SODIUM CHLORIDE 0.9 % IJ SOLN
3.0000 mL | Freq: Two times a day (BID) | INTRAMUSCULAR | Status: DC
  Administered 2014-04-27 – 2014-04-28 (×2): 3 mL via INTRAVENOUS

## 2014-04-27 MED ORDER — INFLUENZA VAC SPLIT QUAD 0.5 ML IM SUSY
0.5000 mL | PREFILLED_SYRINGE | INTRAMUSCULAR | Status: AC
  Administered 2014-04-28: 0.5 mL via INTRAMUSCULAR
  Filled 2014-04-27: qty 0.5

## 2014-04-27 MED ORDER — ASPIRIN 81 MG PO CHEW: 324.0000 mg | CHEWABLE_TABLET | Freq: Once | ORAL | Status: DC

## 2014-04-27 MED ORDER — ACETAMINOPHEN 650 MG RE SUPP: 650.0000 mg | Freq: Four times a day (QID) | RECTAL | Status: DC | PRN

## 2014-04-27 MED ORDER — METOCLOPRAMIDE HCL 5 MG/ML IJ SOLN
10.0000 mg | Freq: Once | INTRAMUSCULAR | Status: AC
  Administered 2014-04-27: 10 mg via INTRAVENOUS
  Filled 2014-04-27: qty 2

## 2014-04-27 MED ORDER — ASPIRIN 300 MG RE SUPP
300.0000 mg | Freq: Once | RECTAL | Status: AC
  Administered 2014-04-27: 300 mg via RECTAL
  Filled 2014-04-27: qty 1

## 2014-04-27 MED ORDER — LORAZEPAM 2 MG/ML IJ SOLN
1.0000 mg | Freq: Once | INTRAMUSCULAR | Status: AC
  Administered 2014-04-27: 1 mg via INTRAVENOUS
  Filled 2014-04-27: qty 1

## 2014-04-27 MED ORDER — ASPIRIN EC 81 MG PO TBEC
81.0000 mg | DELAYED_RELEASE_TABLET | Freq: Every day | ORAL | Status: DC
  Administered 2014-04-28: 81 mg via ORAL
  Filled 2014-04-27: qty 1

## 2014-04-27 MED ORDER — DIPHENHYDRAMINE HCL 50 MG/ML IJ SOLN
25.0000 mg | Freq: Once | INTRAMUSCULAR | Status: AC
  Administered 2014-04-27: 25 mg via INTRAVENOUS
  Filled 2014-04-27: qty 1

## 2014-04-27 NOTE — ED Provider Notes (Signed)
CSN: [id number]     Arrival date & time 04/27/14  0805 History   First MD Initiated Contact with Patient 04/27/14 [phone]     Chief Complaint  Patient presents with  . facial numbness    . Headache     (Consider location/radiation/quality/duration/timing/severity/associated sxs/prior Treatment) HPI Comments: 53 year old female with history of anemia, high blood pressure, lipids, obesity presents with frontal headache and left facial and arm numbness. For the past 2 weeks patient has had gradual onset intermittent frontal headaches sensitive to touch in the frontal sinus region however patient has also had left-sided facial numbness, dizziness, left eye blurry vision and left distal arm numbness fairly constant. No stroke history or fibrillation history. Nothing specifically improves or worsens symptoms. No history of similar headaches or migraines.  Patient is a 53 y.o. female presenting with headaches. The history is provided by the patient.  Headache Associated symptoms: dizziness and numbness   Associated symptoms: no abdominal pain, no back pain, no congestion, no fever, no neck pain, no neck stiffness and no vomiting     Past Medical History  Diagnosis Date  . Hypertension   . GERD (gastroesophageal reflux disease)   . Anemia   . Sickle cell trait   . Morbid obesity with BMI of 50.0-59.9, adult   . Chest pain     a. 02/2012 abnl myoview - inferoapical reverisbility concerning for ischemia, EF 59%;   02/2012 nonobstructive cath  . Abnormal Pap smear of cervix     pt couldnt state what type of abnormality   Past Surgical History  Procedure Laterality Date  . Cholecystectomy    . Tubal ligation    . Spine surgery      fusion   Family History  Problem Relation Age of Onset  . Coronary artery disease Father 80  . Heart disease Father   . Other Father     died in a MVA  . Hypertension Father   . Sudden death Mother 23    Questionable MI  . Diabetes Mother   . Hypertension  Mother   . Hypertension Sister   . Cancer Maternal Aunt     pancreatic  . Stroke Paternal Grandmother   . Breast cancer Maternal Aunt     after age 22   History  Substance Use Topics  . Smoking status: Never Smoker   . Smokeless tobacco: Never Used  . Alcohol Use: No   OB History   Grav Para Term Preterm Abortions TAB SAB Ect Mult Living   5 3   2 2    3      Review of Systems  Constitutional: Negative for fever and chills.  HENT: Negative for congestion.   Eyes: Positive for visual disturbance.  Respiratory: Positive for shortness of breath (chronic).   Cardiovascular: Negative for chest pain.  Gastrointestinal: Negative for vomiting and abdominal pain.  Genitourinary: Negative for dysuria and flank pain.  Musculoskeletal: Negative for back pain, neck pain and neck stiffness.  Skin: Negative for rash.  Neurological: Positive for dizziness, numbness and headaches. Negative for weakness and light-headedness.      Allergies  Review of patient's allergies indicates no known allergies.  Home Medications   Prior to Admission medications   Medication Sig Start Date End Date Taking? Authorizing Provider  acetaminophen (TYLENOL) 500 MG tablet Take 1,000 mg by mouth every 8 (eight) hours as needed (pain).   Yes Historical Provider, MD   BP 156/66  Pulse 72  Temp(Src) 97.9 F (  36.6 C) (Oral)  Resp 22  Ht 5\' 2"  (1.575 m)  Wt 300 lb (136.079 kg)  BMI 54.86 kg/m2  SpO2 97%  LMP 09/15/2013 Physical Exam  Nursing note and vitals reviewed. Constitutional: She is oriented to person, place, and time. She appears well-developed and well-nourished.  HENT:  Head: Normocephalic and atraumatic.  Eyes: Conjunctivae are normal. Right eye exhibits no discharge. Left eye exhibits no discharge.  Neck: Normal range of motion. Neck supple. No tracheal deviation present.  Cardiovascular: Normal rate and regular rhythm.   Pulmonary/Chest: Effort normal and breath sounds normal.   Abdominal: Soft. She exhibits no distension. There is no tenderness. There is no guarding.  Musculoskeletal: She exhibits no edema.  Neurological: She is alert and oriented to person, place, and time. Coordination normal. GCS eye subscore is 4. GCS verbal subscore is 5. GCS motor subscore is 6.  Patient feels decreased sensation left face, left arm and left leg versus right however does have sensation bilateral upper and lower extremities. Patient has 5+ strength at major joints bilateral upper and lower extremities. Patient has very subtle right arm drift. Visual fields intact, pupils equal, neck supple, finger nose intact, and normal-appearing gait however patient feels slightly leaning to one side during walking.  Skin: Skin is warm. No rash noted.  Psychiatric: She has a normal mood and affect.    ED Course  Procedures (including critical care time) Labs Review Labs Reviewed  CBC - Abnormal; Notable for the following:    Hemoglobin 11.4 (*)    HCT 35.1 (*)    MCV 75.2 (*)    MCH 24.4 (*)    All other components within normal limits  DIFFERENTIAL - Abnormal; Notable for the following:    Monocytes Relative 14 (*)    All other components within normal limits  COMPREHENSIVE METABOLIC PANEL - Abnormal; Notable for the following:    Glucose, Bld 104 (*)    Albumin 3.4 (*)    Total Bilirubin 0.2 (*)    GFR calc non Af Amer 74 (*)    GFR calc Af Amer 85 (*)    All other components within normal limits  ETHANOL  URINALYSIS, ROUTINE W REFLEX MICROSCOPIC  URINE RAPID DRUG SCREEN (HOSP PERFORMED)  I-STAT TROPOININ, ED  I-STAT TROPOININ, ED    Imaging Review Ct Head Wo Contrast  04/27/2014   CLINICAL DATA:  Headache, facial numbness, and LEFT leg numbness for 1 month.  EXAM: CT HEAD WITHOUT CONTRAST  TECHNIQUE: Contiguous axial images were obtained from the base of the skull through the vertex without contrast.  COMPARISON:  10/31/2013.  FINDINGS: Normal appearance of the intracranial  structures. No evidence for acute hemorrhage, mass lesion, midline shift, hydrocephalus or large infarct. No acute bony abnormality. The visualized sinuses are clear.  IMPRESSION: No acute intracranial abnormality. Negative exam. No change from priors.   Electronically Signed   By: Rolla Flatten M.D.   On: 04/27/2014 10:28     EKG Interpretation   Date/Time:  Monday April 27 2014 08:13:32 EDT Ventricular Rate:  72 PR Interval:  156 QRS Duration: 88 QT Interval:  404 QTC Calculation: 442 R Axis:   -12 Text Interpretation:  Normal sinus rhythm Minimal voltage criteria for  LVH, may be normal variant Nonspecific T wave abnormality Abnormal ECG  Confirmed by Guillermina Shaft  MD, Jazmyn Offner ([id number]) on 04/27/2014 10:05:16 AM      MDM   Final diagnoses:  Left facial numbness  Chest pain, unspecified chest pain  type  Left arm numbness  Headache, unspecified headache type   Patient presents with headache and mild neural complaints. Discussed differential including complex migraine versus posterior stroke versus other. Plan for blood work, CT head and MRI brain. Neuro consult at. Headache cocktail given.  Initially patient had no chest pain and then during ER stay he developed mild anterior chest discomfort. With vascular risk factors and patient having both chest pain and strokelike symptoms discussed with internal medicine resident for telemetry observation. Neuro consult at. MRI pending. CT results reviewed no acute bleeding. Aspirin ordered  The patients results and plan were reviewed and discussed.   Any x-rays performed were personally reviewed by myself.   Differential diagnosis were considered with the presenting HPI.  Medications  ondansetron (ZOFRAN) injection 4 mg (not administered)  aspirin chewable tablet 324 mg (not administered)  metoCLOPramide (REGLAN) injection 10 mg (10 mg Intravenous Given 04/27/14 1020)  diphenhydrAMINE (BENADRYL) injection 25 mg (25 mg Intravenous Given 04/27/14  1020)  sodium chloride 0.9 % bolus 500 mL (500 mLs Intravenous New Bag/Given 04/27/14 1019)    Filed Vitals:   04/27/14 0813 04/27/14 1108  BP: 156/66 127/75  Pulse: 72   Temp: 97.9 F (36.6 C)   TempSrc: Oral   Resp: 22 18  Height: 5\' 2"  (1.575 m)   Weight: 300 lb (136.079 kg)   SpO2: 97% 100%    Final diagnoses:  Left facial numbness  Chest pain, unspecified chest pain type  Left arm numbness  Headache, unspecified headache type    Admission/ observation were discussed with the admitting physician, patient and/or family and they are comfortable with the plan.     Mariea Clonts, MD 04/27/14 [phone]

## 2014-04-27 NOTE — ED Notes (Signed)
MD at bedside; admitting

## 2014-04-27 NOTE — H&P (Signed)
Date: 04/27/2014               Patient Name:  Hannah Huerta MRN: [id number]  DOB: 1960-10-17 Age / Sex: 53 y.o., female   PCP: No Pcp Per Patient         Medical Service: Internal Medicine Teaching Service         Attending Physician: Dr. Annia Belt, MD    First Contact: Dr. Marvel Plan Pager: [phone]  Second Contact: Dr. Heber Everetts Pager: [phone]       After Hours (After 5p/  First Contact Pager: [phone]  weekends / holidays): Second Contact Pager: [phone]   Chief Complaint: headache, weakness, numbness  History of Present Illness: Hannah Huerta is a 53 yo female with PMHx of hypertension, hyperlipidemia, morbid obesity, and anemia who presents to the ED with complaint of headache, weakness and numbness. Patient states that one week ago she developed a frontal headache with pain behind her eyes and sensitivity to light and sound. Patient tried to take excedrin migraine without relief. Patient had headaches when she was young, but they were thought to be not related to migraine headaches. She has no known history of migraines. She has also had numbness and tingling on the left side of her face in her forehead and cheek with numbness and tingling in her left arm but primarily in the 3rd and 4th fingers. This has been present and worsening over one week. Patient has no known history of CVAs. Patient also admits to dizziness, some confusion and slurred speech. Her husband noticed the slurred speech but she does admit to having a harder time getting words out. She admits to weakness, but she is able to walk and denies any falls. She has been able to complete her normal daily tasks.   While in the ED, patient developed substernal left sided pressure and chest pain, it was 7/10 and lasted for 10 minutes but without radiation of pain to the neck, back or left arm. She did admit to increased shortness of breath and has had nausea since this morning. Patient had a cath in 2013 by Dr. Lia Foyer  which showed heavily calcified small D2 not suitable for PCI but no other significant findings. Patient is not any home medications and does not follow with a physician.   Meds: Current Facility-Administered Medications  Medication Dose Route Frequency Provider Last Rate Last Dose  . [START ON 04/28/2014] aspirin EC tablet 81 mg  81 mg Oral Daily Dawud Mays Richardson, MD      . enoxaparin (LOVENOX) injection 40 mg  40 mg Subcutaneous Q24H Chayne Baumgart Richardson, MD      . Derrill Memo ON 04/28/2014] Influenza vac split quadrivalent PF (FLUARIX) injection 0.5 mL  0.5 mL Intramuscular Tomorrow-1000 Annia Belt, MD      . LORazepam (ATIVAN) injection 1 mg  1 mg Intravenous Once Druscilla Petsch Richardson, MD      . ondansetron (ZOFRAN) injection 4 mg  4 mg Intravenous Q8H PRN Mariea Clonts, MD   4 mg at 04/27/14 1155  . [START ON 04/28/2014] pneumococcal 23 valent vaccine (PNU-IMMUNE) injection 0.5 mL  0.5 mL Intramuscular Tomorrow-1000 Annia Belt, MD        Allergies: Allergies as of 04/27/2014  . (No Known Allergies)   Past Medical History  Diagnosis Date  . Hypertension   . GERD (gastroesophageal reflux disease)   . Anemia   . Sickle cell trait   . Morbid obesity with BMI of 50.0-59.9, adult   .  Chest pain     a. 02/2012 abnl myoview - inferoapical reverisbility concerning for ischemia, EF 59%;   02/2012 nonobstructive cath  . Abnormal Pap smear of cervix     pt couldnt state what type of abnormality   Past Surgical History  Procedure Laterality Date  . Cholecystectomy    . Tubal ligation    . Spine surgery      fusion   Family History  Problem Relation Age of Onset  . Coronary artery disease Father 42  . Heart disease Father   . Other Father     died in a MVA  . Hypertension Father   . Sudden death Mother 42    Questionable MI  . Diabetes Mother   . Hypertension Mother   . Hypertension Sister   . Cancer Maternal Aunt     pancreatic  . Stroke Paternal Grandmother   . Breast  cancer Maternal Aunt     after age 47   History   Social History  . Marital Status: Married    Spouse Name: N/A    Number of Children: 3  . Years of Education: N/A   Occupational History  . Works in the [employer] Topics  . Smoking status: Never Smoker   . Smokeless tobacco: Never Used  . Alcohol Use: No  . Drug Use: No  . Sexual Activity: Yes    Birth Control/ Protection: Surgical   Other Topics Concern  . Not on file   Social History Narrative   Lives at home with husband and two of her sons.      Review of Systems: General: Denies fever, chills, fatigue, change in appetite and diaphoresis.  Respiratory: Admits to SOB. Denies cough, DOE, chest tightness, and wheezing.   Cardiovascular: Admits to chest pressure- now resolved. Denies palpitations.  Gastrointestinal: Admits to nausea and BRBPR, Denies vomiting, abdominal pain, diarrhea, constipation, and abdominal distention.  Genitourinary: Denies dysuria, urgency, frequency, hematuria, suprapubic pain and flank pain. Endocrine: Denies hot or cold intolerance, polyuria, and polydipsia. Musculoskeletal: Denies myalgias, back pain, joint swelling, arthralgias and gait problem.  Skin: Denies pallor, rash and wounds.  Neurological: Admits to dizziness, headaches, left sided weakness, lightheadedness, left facial and left arm numbness,Denies seizures, and syncope, Psychiatric/Behavioral: Denies mood changes, confusion, nervousness, sleep disturbance and agitation.  Physical Exam: Filed Vitals:   04/27/14 0813 04/27/14 1108 04/27/14 1142 04/27/14 1318  BP: 156/66 127/75 119/74 114/56  Pulse: 72   65  Temp: 97.9 F (36.6 C)   98.2 F (36.8 C)  TempSrc: Oral   Oral  Resp: 22 18 17 16   Height: 5\' 2"  (1.575 m)   5\' 2"  (1.575 m)  Weight: 136.079 kg (300 lb)   132.314 kg (291 lb 11.2 oz)  SpO2: 97% 100% 100% 92%   General: Vital signs reviewed.  Patient is well-developed and well-nourished, in no  acute distress and cooperative with exam.  Neck: No carotid bruit present.  Cardiovascular: RRR, S1 normal, S2 normal, no murmurs, gallops, or rubs. Pulmonary/Chest: Clear to auscultation bilaterally, no wheezes, rales, or rhonchi. Patient did have reproducible chest soreness with palpation of left side of sternum. Abdominal: Soft, non-tender, obese, BS +, no masses, organomegaly, or guarding present.  Musculoskeletal: No joint deformities, erythema, or stiffness, ROM full and nontender. Extremities: No lower extremity edema bilaterally,  pulses symmetric and intact bilaterally. No cyanosis or clubbing. Neurological: A&O x3, Strength is normal and symmetric bilaterally, cranial nerve II-XII are  grossly intact, no focal motor deficit, sensory intact to light touch bilaterally with reports of decreased sensation on the left.  Skin: Warm, dry and intact. No rashes or erythema. Psychiatric: Normal mood and affect. speech and behavior is normal. Cognition and memory are normal.   Neurologic Exam:   Mental Status: Alert, oriented, thought content appropriate.  Speech fluent without evidence of aphasia.   Cranial Nerves:   II:  Visual fields grossly intact.  III/IV/VI: Extraocular movements intact.  Pupils reactive bilaterally.  V/VII: Smile symmetric. facial light touch sensation normal bilaterally.  VIII: Grossly intact.  XI: Bilateral shoulder shrug normal.  XII: Midline tongue extension normal.  Motor:  5/5 bilaterally with normal tone and bulk  Sensory:  Pinprick and light touch intact throughout, bilaterally  Cerebellar: Normal finger-to-nose, normal rapid alternating movements and normal heel-to-shin test.  Normal gait and station.     Lab results: Basic Metabolic Panel:  Recent Labs  04/27/14 1000  NA 142  K 3.8  CL 99  CO2 31  GLUCOSE 104*  BUN 15  CREATININE 0.88  CALCIUM 9.4   Liver Function Tests:  Recent Labs  04/27/14 1000  AST 22  ALT 13  ALKPHOS 79  BILITOT  0.2*  PROT 7.9  ALBUMIN 3.4*   CBC:  Recent Labs  04/27/14 1000  WBC 4.3  NEUTROABS 2.6  HGB 11.4*  HCT 35.1*  MCV 75.2*  PLT 285   Alcohol Level:  Recent Labs  04/27/14 1000  ETH <11   Imaging results:  Ct Head Wo Contrast  04/27/2014   CLINICAL DATA:  Headache, facial numbness, and LEFT leg numbness for 1 month.  EXAM: CT HEAD WITHOUT CONTRAST  TECHNIQUE: Contiguous axial images were obtained from the base of the skull through the vertex without contrast.  COMPARISON:  10/31/2013.  FINDINGS: Normal appearance of the intracranial structures. No evidence for acute hemorrhage, mass lesion, midline shift, hydrocephalus or large infarct. No acute bony abnormality. The visualized sinuses are clear.  IMPRESSION: No acute intracranial abnormality. Negative exam. No change from priors.   Electronically Signed   By: Rolla Flatten M.D.   On: 04/27/2014 10:28    Other results: EKG: normal EKG, normal sinus rhythm, unchanged from previous tracings.  Assessment & Plan by Problem: Active Problems:   Stroke-like symptoms  Complex Migraine versus CVA: Patient presents with frontal headache and pain behind her eyes that started one week ago and associated with photophobia, phonophobia, left sided numbness and tingling and left sided weakness. Excedrin and tylenol did not help. On admission, BP was 156/66. Physical exam is only remarkable for decreased sensation on the left face and left arm versus the right. No obvious focal motor deficits, strength normal bilaterally. CT Head showed no acute intracranial abnormality and no change from priors. Neurology has been consulted and has seen the patient. Patient failed swallow screen by nurse. Patient's risk factors include hypertension and obesity.  -Bed Rest -Telemetry -NPO -MRI Brain -Neuro checks -SLP eval and treat -Appreciate neuro recs -Hemoglobin A1C  Chest Pain: Patient developed non-radiating substernal left sided chest pressure that  last for 10 minutes and was associated with increased shortness of breath and nausea. Patient had a cath in 2013 which showed heavily calcified small D2 not suitable for PCI but no other significant findings. At baseline, patient has shortness of breath with exertion but no chest pain. Echo in March 2015 showed normal EF of 55% and there was evidence of left ventricular diastolic dysfunction. UDS  today was negative. UA negative. POC troponin was negative x 2. EKG shows NSR with T wave inversions in leads v5 and v6.  -ASA 300 mg once -ASA 81 mg daily -Zofran 4 mg IV q8H prn  -Bed Rest -Telemetry -NPO -Trend troponins -Repeat EKG in am  Microcytic Anemia: Hemoglobin 11.4 on admission with MCV 75.2. Baseline appears to be 11 since June 2008. Patient no longer has her periods as she reports she is going through menopause. Patient does admit to BRBPR, but denies dark stools. She has never had a colonoscopy. In 2013, patient had an iron level of 44, TIBC 336 and saturation ratio of 13.  -Pursue anemia work up  HTN: Patient's blood pressure on admission was 156/66, but blood pressure improved to 114/56. Patient has been told in the past that she has hypertension but is not currently on any blood pressure medications. We will monitor the patient's blood pressure during admission and consider starting her on blood pressure medications as indicated in the future. We will allow for permissive hypertension in the setting of possible CVA. -Monitor  Hyperlipidemia: Patient's last lipid profile showed Cholesterol 177, TG 98, HDL 57, LDL 100. Patient is not currently on statin therapy.  -Currently controlled -Consider statin therapy in the future  OSA: Patient was recommended to have sleep study in the past, but the test was too expensive for her. Patient should consider getting this test in the future. She is a large neck circumference and obesity.   DVT/PE ppx: Lovenox SQ daily  Dispo: Disposition is  deferred at this time, awaiting improvement of current medical problems. Anticipated discharge in approximately 1-2 day(s).   The patient does not have a current PCP (No Pcp Per Patient) and does need an [facility] hospital follow-up appointment after discharge.  The patient does not have transportation limitations that hinder transportation to clinic appointments.  Signed: Osa Craver, DO PGY-1 Internal Medicine Resident Pager # [phone] 04/27/2014 1:59 PM

## 2014-04-27 NOTE — Evaluation (Signed)
Clinical/Bedside Swallow Evaluation Patient Details  Name: Hannah Huerta MRN: [id number] Date of Birth: [birth year]  Today's Date: 04/27/2014 Time: 3299-2426 SLP Time Calculation (min): 31 min  Past Medical History:  Past Medical History  Diagnosis Date  . Hypertension   . GERD (gastroesophageal reflux disease)   . Anemia   . Sickle cell trait   . Morbid obesity with BMI of 50.0-59.9, adult   . Chest pain     a. 02/2012 abnl myoview - inferoapical reverisbility concerning for ischemia, EF 59%;   02/2012 nonobstructive cath  . Abnormal Pap smear of cervix     pt couldnt state what type of abnormality   Past Surgical History:  Past Surgical History  Procedure Laterality Date  . Cholecystectomy    . Tubal ligation    . Spine surgery      fusion   HPI:  Mrs. Lindahl is a 53 yo female with PMHx of hypertension, hyperlipidemia, morbid obesity, and anemia who presents to the ED with complaint of headache, weakness and numbness. Pt states that one week ago she developed a frontal headache with pain behind her eyes and sensitivity to light and sound.  She has no known history of migraines. She has also had numbness and tingling on the left side of her face in her forehead and cheek with numbness and tingling in her left arm. This has been present and worsening over one week. Patient has no known history of CVAs. Patient also admits to dizziness, some confusion and slurred speech.She has been able to complete her normal daily tasks. CT showe no acute findings, MRI results also negative.  Pt failed RN swallow screen secondary to c/o cracker sticking in throat.   Assessment / Plan / Recommendation Clinical Impression  Patient appears to have normal swallow function, with timely swallow initiation, no cough or throat clearing, and voice clear after swallows.  Pt is now able to eat peanut butter crackers and denies anything sticking in her throat.    Aspiration Risk  Mild    Diet Recommendation  Regular;Thin liquid   Liquid Administration via: Cup;Straw Medication Administration: Whole meds with liquid Supervision: Patient able to self feed Compensations: Slow rate;Small sips/bites Postural Changes and/or Swallow Maneuvers: Seated upright 90 degrees;Upright 30-60 min after meal    Other  Recommendations Oral Care Recommendations: Oral care BID;Patient independent with oral care Other Recommendations: Clarify dietary restrictions   Follow Up Recommendations  None    Frequency and Duration        Pertinent Vitals/Pain 5 (headache) RN aware        Swallow Study Prior Functional Status       General HPI: Mrs. Hentges is a 53 yo female with PMHx of hypertension, hyperlipidemia, morbid obesity, and anemia who presents to the ED with complaint of headache, weakness and numbness. Pt states that one week ago she developed a frontal headache with pain behind her eyes and sensitivity to light and sound.  She has no known history of migraines. She has also had numbness and tingling on the left side of her face in her forehead and cheek with numbness and tingling in her left arm. This has been present and worsening over one week. Patient has no known history of CVAs. Patient also admits to dizziness, some confusion and slurred speech.She has been able to complete her normal daily tasks. CT showe no acute findings, MRI results also negative.  Pt failed RN swallow screen secondary to c/o cracker sticking in throat.  Type of Study: Bedside swallow evaluation Previous Swallow Assessment: None  Diet Prior to this Study: NPO Temperature Spikes Noted: No Respiratory Status: Room air History of Recent Intubation: No Behavior/Cognition: Alert;Cooperative;Pleasant mood Oral Cavity - Dentition: Missing dentition;Adequate natural dentition Self-Feeding Abilities: Able to feed self Patient Positioning: Upright in bed Baseline Vocal Quality: Clear Volitional Cough: Strong Volitional Swallow: Able  to elicit    Oral/Motor/Sensory Function Overall Oral Motor/Sensory Function: Appears within functional limits for tasks assessed   Ice Chips Ice chips: Not tested   Thin Liquid Thin Liquid: Within functional limits Presentation: Straw    Nectar Thick Nectar Thick Liquid: Not tested   Honey Thick Honey Thick Liquid: Not tested   Puree Puree: Within functional limits   Solid   GO Functional Assessment Tool Used: clinical judgement Functional Limitations: Swallowing Swallow Current Status (X5400): 0 percent impaired, limited or restricted Swallow Goal Status (Q6761): 0 percent impaired, limited or restricted Swallow Discharge Status [phone]): 0 percent impaired, limited or restricted  Solid: Within functional limits Presentation: Self Hannah Huerta, Hannah Huerta 04/27/2014,4:42 PM

## 2014-04-27 NOTE — ED Notes (Signed)
Per ED MD original troponin and second troponin should both be run due to patient's new onset of CP

## 2014-04-27 NOTE — Consult Note (Signed)
Referring Physician: Beryle Beams    Chief Complaint: HA and left sided tingling for one week  HPI:                                                                                                                                         Hannah Huerta is an 53 y.o. female with known history of migraine HA in past which she was never treated for by a PCP.  She states in the past HA would be located over her left eye, consist of photo and phono phobia, throbbing in sensation and at times she would have tingling in her left aspect of her face.  It has been a while since she has had a migraine.  About one week ago she noted she had a throbbing HA located over the left aspect of her face associated with left facial tingling which was intermittent. The HA did not improve over the week and remained a 7/10.  She does endorse photophobia, phonophobia, tingling of her left aspect of face along with left arm, trunk and leg.   She has only taken extra strength Excedrin and tylenol.  She has never been Rx medication for her migraines.   While in ED she has received benadryl, Reglan and ASA with no relief.   Date last known well: Date: 04/20/2014 Time last known well: Unable to determine tPA Given: No: out of window  Past Medical History  Diagnosis Date  . Hypertension   . GERD (gastroesophageal reflux disease)   . Anemia   . Sickle cell trait   . Morbid obesity with BMI of 50.0-59.9, adult   . Chest pain     a. 02/2012 abnl myoview - inferoapical reverisbility concerning for ischemia, EF 59%;   02/2012 nonobstructive cath  . Abnormal Pap smear of cervix     pt couldnt state what type of abnormality    Past Surgical History  Procedure Laterality Date  . Cholecystectomy    . Tubal ligation    . Spine surgery      fusion    Family History  Problem Relation Age of Onset  . Coronary artery disease Father 71  . Heart disease Father   . Other Father     died in a MVA  . Hypertension Father   .  Sudden death Mother 67    Questionable MI  . Diabetes Mother   . Hypertension Mother   . Hypertension Sister   . Cancer Maternal Aunt     pancreatic  . Stroke Paternal Grandmother   . Breast cancer Maternal Aunt     after age 29   Social History:  reports that she has never smoked. She has never used smokeless tobacco. She reports that she does not drink alcohol or use illicit drugs.  Allergies: No Known Allergies  Medications:  Current Facility-Administered Medications  Medication Dose Route Frequency Provider Last Rate Last Dose  . acetaminophen (TYLENOL) tablet 650 mg  650 mg Oral Q6H PRN Lucious Groves, DO       Or  . acetaminophen (TYLENOL) suppository 650 mg  650 mg Rectal Q6H PRN Lucious Groves, DO      . Derrill Memo ON 04/28/2014] aspirin EC tablet 81 mg  81 mg Oral Daily Alexa Richardson, MD      . enoxaparin (LOVENOX) injection 40 mg  40 mg Subcutaneous Q24H Osa Craver, MD   40 mg at 04/27/14 1531  . [START ON 04/28/2014] Influenza vac split quadrivalent PF (FLUARIX) injection 0.5 mL  0.5 mL Intramuscular Tomorrow-1000 Annia Belt, MD      . ondansetron [facility]) injection 4 mg  4 mg Intravenous Q8H PRN Mariea Clonts, MD   4 mg at 04/27/14 1155  . [START ON 04/28/2014] pneumococcal 23 valent vaccine (PNU-IMMUNE) injection 0.5 mL  0.5 mL Intramuscular Tomorrow-1000 Annia Belt, MD      . sodium chloride 0.9 % injection 3 mL  3 mL Intravenous Q12H Lucious Groves, DO         ROS:                                                                                                                                       History obtained from the patient  General ROS: negative for - chills, fatigue, fever, night sweats, weight gain or weight loss Psychological ROS: negative for - behavioral disorder, hallucinations, memory difficulties, mood swings  or suicidal ideation Ophthalmic ROS: negative for - blurry vision, double vision, eye pain or loss of vision ENT ROS: negative for - epistaxis, nasal discharge, oral lesions, sore throat, tinnitus or vertigo Allergy and Immunology ROS: negative for - hives or itchy/watery eyes Hematological and Lymphatic ROS: negative for - bleeding problems, bruising or swollen lymph nodes Endocrine ROS: negative for - galactorrhea, hair pattern changes, polydipsia/polyuria or temperature intolerance Respiratory ROS: negative for - cough, hemoptysis, shortness of breath or wheezing Cardiovascular ROS: negative for - chest pain, dyspnea on exertion, edema or irregular heartbeat Gastrointestinal ROS: negative for - abdominal pain, diarrhea, hematemesis, nausea/vomiting or stool incontinence Genito-Urinary ROS: negative for - dysuria, hematuria, incontinence or urinary frequency/urgency Musculoskeletal ROS: negative for - joint swelling or muscular weakness Neurological ROS: as noted in HPI Dermatological ROS: negative for rash and skin lesion changes  Neurologic Examination:  Blood pressure 118/56, pulse 65, temperature 98.1 F (36.7 C), temperature source Oral, resp. rate 16, height 5\' 2"  (1.575 m), weight 132.314 kg (291 lb 11.2 oz), last menstrual period 09/15/2013, SpO2 95.00%.   General: NAD Mental Status: Alert, oriented, thought content appropriate.  Speech fluent without evidence of aphasia.  Able to follow 3 step commands without difficulty. Cranial Nerves: II: Discs flat bilaterally; Visual fields grossly normal, pupils equal, round, reactive to light and accommodation III,IV, VI: ptosis not present, extra-ocular motions intact bilaterally V,VII: smile symmetric, facial sensation decreased on the left and splits midline to tuning fork and PP/LT VIII: hearing normal bilaterally IX,X: gag reflex  present XI: bilateral shoulder shrug XII: midline tongue extension without atrophy or fasciculations  Motor: Right : Upper extremity   5/5    Left:     Upper extremity   5/5  Lower extremity   5/5     Lower extremity   5/5 Tone and bulk:normal tone throughout; no atrophy noted Sensory: decreased to PP and LT on left side of body, splitting midline down trunk Deep Tendon Reflexes:  1+ throughout  Plantars: Right: downgoing   Left: downgoing Cerebellar: normal finger-to-nose,  normal heel-to-shin test Gait: not tested due to multiple leads CV: pulses palpable throughout    Lab Results: Basic Metabolic Panel:  Recent Labs Lab 04/27/14 1000  NA 142  K 3.8  CL 99  CO2 31  GLUCOSE 104*  BUN 15  CREATININE 0.88  CALCIUM 9.4    Liver Function Tests:  Recent Labs Lab 04/27/14 1000  AST 22  ALT 13  ALKPHOS 79  BILITOT 0.2*  PROT 7.9  ALBUMIN 3.4*   No results found for this basename: LIPASE, AMYLASE,  in the last 168 hours No results found for this basename: AMMONIA,  in the last 168 hours  CBC:  Recent Labs Lab 04/27/14 1000  WBC 4.3  NEUTROABS 2.6  HGB 11.4*  HCT 35.1*  MCV 75.2*  PLT 285    Cardiac Enzymes:  Recent Labs Lab 04/27/14 1508  TROPONINI <0.30    Lipid Panel: No results found for this basename: CHOL, TRIG, HDL, CHOLHDL, VLDL, LDLCALC,  in the last 168 hours  CBG: No results found for this basename: GLUCAP,  in the last 168 hours  Microbiology: Results for orders placed during the hospital encounter of 03/25/09  RAPID STREP SCREEN     Status: None   Collection Time    03/25/09 10:25 PM      Result Value Ref Range Status   Streptococcus, Group A Screen (Direct) NEGATIVE  NEGATIVE Final    Coagulation Studies: No results found for this basename: LABPROT, INR,  in the last 72 hours  Imaging: Ct Head Wo Contrast  04/27/2014   CLINICAL DATA:  Headache, facial numbness, and LEFT leg numbness for 1 month.  EXAM: CT HEAD WITHOUT  CONTRAST  TECHNIQUE: Contiguous axial images were obtained from the base of the skull through the vertex without contrast.  COMPARISON:  10/31/2013.  FINDINGS: Normal appearance of the intracranial structures. No evidence for acute hemorrhage, mass lesion, midline shift, hydrocephalus or large infarct. No acute bony abnormality. The visualized sinuses are clear.  IMPRESSION: No acute intracranial abnormality. Negative exam. No change from priors.   Electronically Signed   By: Rolla Flatten M.D.   On: 04/27/2014 10:28   Mr Brain Wo Contrast  04/27/2014   CLINICAL DATA:  Progressive headaches over the last week none result with over the counter  medications. Left-sided numbness and tingling involving her face and left upper extremity extending to the left third and fourth digits. Dizziness and slurred speech. Sensitivity to light and sound.  EXAM: MRI HEAD WITHOUT CONTRAST  TECHNIQUE: Multiplanar, multiecho pulse sequences of the brain and surrounding structures were obtained without intravenous contrast.  COMPARISON:  CT head without contrast 04/27/2014. MRI brain 11/13/2003  FINDINGS: No acute infarct, hemorrhage, or mass lesion is present. The ventricles are of normal size. No significant extraaxial fluid collection is present. Midline structures are within normal limits. The cerebellar tonsils extend to the foramen magnum without extension. Flow is present in the major intracranial arteries. The globes and orbits are intact. The paranasal sinuses and mastoid air cells are clear.  IMPRESSION: Negative MRI of the brain.   Electronically Signed   By: Lawrence Santiago M.D.   On: 04/27/2014 15:39    Etta Quill PA-C [facility] [phone]  04/27/2014, 4:12 PM  Patient seen and examined.  Clinical course and management discussed.  Necessary edits performed.  I agree with the above.  Assessment and plan of care developed and discussed below.      Assessment: 53 y.o. female presenting with left  sided headache and left paresthesias for the past week.  Has had similar headaches in the past but has not had one recently.  Neurological examination with multiple functional features.  Head CT reviewed and shows no acute changes.  MRI of the brain reviewed as well and shows no abnormalities.  Suspect complicated migraine.  Has had some response to nonsteroidals in the past.    Stroke Risk Factors - hypertension  Recommendations: 1.  Toradol 30mg  IV, Compazine 10mg  IV and Decadron 4mg  IV now for headache abortion 2.  Would not initiate stroke work up   Alexis Goodell, MD [facility] [phone]  04/27/2014  4:17 PM

## 2014-04-27 NOTE — ED Notes (Signed)
I asked EDP if he want me to stop the 1st Troponin that was running when I received the 2nd one I called to confirm on what I needed to do, Dr. Steffanie Dunn said run both I stat Troponin, because they were 1 hour apart when they were drawn and patient started complaining of chest pain hour later after 1st one was drawn and  2nd one brought to me to run.

## 2014-04-27 NOTE — ED Notes (Signed)
Patient states started having headache, facial numbness and L leg numbness x 1 month ago.   Patient states it has gotten worse as time goes on.   Patient in no distress at this time.  Ambulatory in triage.

## 2014-04-28 DIAGNOSIS — E785 Hyperlipidemia, unspecified: Secondary | ICD-10-CM | POA: Diagnosis not present

## 2014-04-28 DIAGNOSIS — E119 Type 2 diabetes mellitus without complications: Secondary | ICD-10-CM | POA: Diagnosis not present

## 2014-04-28 DIAGNOSIS — R0789 Other chest pain: Secondary | ICD-10-CM | POA: Diagnosis not present

## 2014-04-28 DIAGNOSIS — R208 Other disturbances of skin sensation: Secondary | ICD-10-CM | POA: Diagnosis not present

## 2014-04-28 DIAGNOSIS — D509 Iron deficiency anemia, unspecified: Secondary | ICD-10-CM | POA: Diagnosis not present

## 2014-04-28 DIAGNOSIS — K219 Gastro-esophageal reflux disease without esophagitis: Secondary | ICD-10-CM | POA: Diagnosis not present

## 2014-04-28 DIAGNOSIS — I10 Essential (primary) hypertension: Secondary | ICD-10-CM | POA: Diagnosis not present

## 2014-04-28 DIAGNOSIS — G43109 Migraine with aura, not intractable, without status migrainosus: Secondary | ICD-10-CM | POA: Diagnosis not present

## 2014-04-28 LAB — IRON AND TIBC
Iron: 57 ug/dL (ref 42–135)
Saturation Ratios: 19 % — ABNORMAL LOW (ref 20–55)
TIBC: 307 ug/dL (ref 250–470)
UIBC: 250 ug/dL (ref 125–400)

## 2014-04-28 LAB — HEMOGLOBIN A1C
Hgb A1c MFr Bld: 6.5 % — ABNORMAL HIGH (ref <5.7)
Mean Plasma Glucose: 140 mg/dL — ABNORMAL HIGH (ref <117)

## 2014-04-28 LAB — VITAMIN B12: Vitamin B-12: 504 pg/mL (ref 211–911)

## 2014-04-28 LAB — FERRITIN: Ferritin: 82 ng/mL (ref 10–291)

## 2014-04-28 LAB — RETICULOCYTES
RBC.: 4.78 MIL/uL (ref 3.87–5.11)
Retic Count, Absolute: 52.6 10*3/uL (ref 19.0–186.0)
Retic Ct Pct: 1.1 % (ref 0.4–3.1)

## 2014-04-28 LAB — FOLATE: Folate: 14.2 ng/mL

## 2014-04-28 LAB — TROPONIN I: Troponin I: <0.3 ng/mL (ref <0.30)

## 2014-04-28 MED ORDER — ASPIRIN 81 MG PO TBEC: 81.0000 mg | DELAYED_RELEASE_TABLET | Freq: Every day | ORAL | Status: DC

## 2014-04-28 MED ORDER — SENNOSIDES-DOCUSATE SODIUM 8.6-50 MG PO TABS
2.0000 | ORAL_TABLET | Freq: Once | ORAL | Status: DC
Start: 2014-04-28 — End: 2014-04-28

## 2014-04-28 MED ORDER — TOPIRAMATE 25 MG PO TABS
50.0000 mg | ORAL_TABLET | Freq: Two times a day (BID) | ORAL | Status: DC
  Administered 2014-04-28: 50 mg via ORAL
  Filled 2014-04-28: qty 2

## 2014-04-28 MED ORDER — TOPIRAMATE 25 MG PO TABS: 25.0000 mg | ORAL_TABLET | Freq: Every day | ORAL | Status: DC

## 2014-04-28 MED ORDER — TOPIRAMATE 50 MG PO TABS: 50.0000 mg | ORAL_TABLET | Freq: Two times a day (BID) | ORAL | Status: DC

## 2014-04-28 MED ORDER — SUMATRIPTAN SUCCINATE 25 MG PO TABS: 25.0000 mg | ORAL_TABLET | ORAL | Status: DC | PRN

## 2014-04-28 MED ORDER — ONDANSETRON HCL 4 MG PO TABS
4.0000 mg | ORAL_TABLET | Freq: Once | ORAL | Status: AC
  Administered 2014-04-28: 4 mg via ORAL
  Filled 2014-04-28: qty 1

## 2014-04-28 NOTE — Discharge Summary (Signed)
Name: Hannah Huerta MRN: [id number] DOB: Jul 21, 1960 53 y.o. PCP: No Pcp Per Patient  Date of Admission: 04/27/2014  9:08 AM Date of Discharge: 04/28/2014 Attending Physician: No att. providers found  Discharge Diagnosis:  Principal Problem:   Atypical chest pain Active Problems:   Microcytic anemia   HTN (hypertension)   Hyperlipidemia   Migraine headache  Discharge Medications:   Medication List         acetaminophen 500 MG tablet  Commonly known as:  TYLENOL  Take 1,000 mg by mouth every 8 (eight) hours as needed (pain).     aspirin 81 MG EC tablet  Take 1 tablet (81 mg total) by mouth daily.     SUMAtriptan 25 MG tablet  Commonly known as:  IMITREX  Take 1 tablet (25 mg total) by mouth every 2 (two) hours as needed for migraine or headache. May repeat in 2 hours if headache persists or recurs.     topiramate 25 MG tablet  Commonly known as:  TOPAMAX  Take 1 tablet (25 mg total) by mouth at bedtime.        Disposition and follow-up:   Hannah Huerta was discharged from [facility] in Good condition.  At the hospital follow up visit please address:  1. Complex Migraine: Resolution and no recurrence of numbness, weakness or headache. If so, please assess if the Imitrex is helping. Please ask if the patient is taking Topamax 25 mg QHS.  DM: Hemoglobin A1C 6.5 (first record of hemoglobin A1C). Please repeat and if >6.4, consider starting patient on metformin and moderate intensity statin. Please see below.  2.  Labs / imaging needed at time of follow-up: Hemoglobin A1C, Consider outpatient stress test. Consider outpatient sleep study for OSA.  3.  Pending labs/ test needing follow-up: Anemia Panel   Follow-up Appointments: Follow-up Information   Follow up with Jerene Pitch, MD On 05/13/2014. (9:15 am)    Specialty:  Internal Medicine   Contact information:   [place] [address] [phone]       Follow up with [place],  [place], DO. Call in 1 week. (call to make appointment for Neurology follow up)    Specialty:  Neurology   Contact information:   [place] [address] [phone]       Discharge Instructions: Discharge Instructions   Diet - low sodium heart healthy    Complete by:  As directed      Increase activity slowly    Complete by:  As directed            Procedures Performed:  Ct Head Wo Contrast  04/27/2014   CLINICAL DATA:  Headache, facial numbness, and LEFT leg numbness for 1 month.  EXAM: CT HEAD WITHOUT CONTRAST  TECHNIQUE: Contiguous axial images were obtained from the base of the skull through the vertex without contrast.  COMPARISON:  10/31/2013.  FINDINGS: Normal appearance of the intracranial structures. No evidence for acute hemorrhage, mass lesion, midline shift, hydrocephalus or large infarct. No acute bony abnormality. The visualized sinuses are clear.  IMPRESSION: No acute intracranial abnormality. Negative exam. No change from priors.   Electronically Signed   By: Rolla Flatten M.D.   On: 04/27/2014 10:28   Mr Brain Wo Contrast  04/27/2014   CLINICAL DATA:  Progressive headaches over the last week none result with over the counter medications. Left-sided numbness and tingling involving her face and left  upper extremity extending to the left third and fourth digits. Dizziness and slurred speech. Sensitivity to light and sound.  EXAM: MRI HEAD WITHOUT CONTRAST  TECHNIQUE: Multiplanar, multiecho pulse sequences of the brain and surrounding structures were obtained without intravenous contrast.  COMPARISON:  CT head without contrast 04/27/2014. MRI brain 11/13/2003  FINDINGS: No acute infarct, hemorrhage, or mass lesion is present. The ventricles are of normal size. No significant extraaxial fluid collection is present. Midline structures are within normal limits. The cerebellar tonsils extend to the foramen magnum without extension. Flow is  present in the major intracranial arteries. The globes and orbits are intact. The paranasal sinuses and mastoid air cells are clear.  IMPRESSION: Negative MRI of the brain.   Electronically Signed   By: Lawrence Santiago M.D.   On: 04/27/2014 15:39   Admission HPI: Hannah Huerta is a 53 yo female with PMHx of hypertension, hyperlipidemia, morbid obesity, and anemia who presents to the ED with complaint of headache, weakness and numbness. Patient states that one week ago she developed a frontal headache with pain behind her eyes and sensitivity to light and sound. Patient tried to take excedrin migraine without relief. Patient had headaches when she was young, but they were thought to be not related to migraine headaches. She has no known history of migraines. She has also had numbness and tingling on the left side of her face in her forehead and cheek with numbness and tingling in her left arm but primarily in the 3rd and 4th fingers. This has been present and worsening over one week. Patient has no known history of CVAs. Patient also admits to dizziness, some confusion and slurred speech. Her husband noticed the slurred speech but she does admit to having a harder time getting words out. She admits to weakness, but she is able to walk and denies any falls. She has been able to complete her normal daily tasks.  While in the ED, patient developed substernal left sided pressure and chest pain, it was 7/10 and lasted for 10 minutes but without radiation of pain to the neck, back or left arm. She did admit to increased shortness of breath and has had nausea since this morning. Patient had a cath in 2013 by Dr. Lia Foyer which showed heavily calcified small D2 not suitable for PCI but no other significant findings. Patient is not any home medications and does not follow with a physician.   Hospital Course by problem list: Principal Problem:   Atypical chest pain Active Problems:   Microcytic anemia   HTN  (hypertension)   Hyperlipidemia   Migraine headache   Complex Migraine: Patient presented with a frontal headache and pain behind her eyes that started one week prior and was associated with photophobia, phonophobia, left sided numbness, tingling and left sided weakness. Excedrin and tylenol did not help. On admission, BP was 156/66. Physical exam was only remarkable for decreased sensation on the left face and left arm versus the right. No obvious focal motor deficits, strength was normal bilaterally. CT Head showed no acute intracranial abnormality and no change from priors. MRI brain was negative. Patient was seen by Neurology who recommended no further stroke work up and to treat for complex migraine with Toradol 30 mg IV, compazine 10 mg IV, and decadron 4 mg IV. The following day, patient's headache, numbness, and weakness were completely resolved this morning. Patient had been referred to Dr. Tomi Likens in the past for migraines but had never followed up. We started  the patient on topamax 25 mg QHS for migraine prophylaxis and Imitrex 25 mg 1 to 4 pills for migraine abortion on discharge. Patient was discharged home with follow up in our [facility] clinic. She was also instructed to call Dr. Georgie Chard office to make a follow up appointment.   Chest Pain: Patient developed non-radiating substernal left sided chest pressure that last for 10 minutes and was associated with increased shortness of breath and nausea while she was in the ED. Patient had a cath in 2013 which showed heavily calcified small D2 not suitable for PCI but no other significant findings. At baseline, patient has shortness of breath with exertion but no chest pain. Echo in March 2015 showed normal EF of 55% and there was evidence of left ventricular diastolic dysfunction. UDS was negative. UA negative. POC troponin was negative x 2. Troponins were negative x 3. Repeat EKG showed NSR. Patient has had work up in the past for her chest pain and given  the diagnosis of Precordial Pain. She should be considered for outpatient stress test. Patient was prescribed ASA 81 mg daily on discharge.   Microcytic Anemia: Hemoglobin 11.4 on admission with MCV 75.2. Baseline appears to be 11 since June 2008. Patient no longer has her periods as she reports she is going through menopause. Patient does admit to BRBPR, but denies dark stools. She has never had a colonoscopy. In 2013, patient had an iron level of 44, TIBC 336 and saturation ratio of 13. Anemia panel was ordered on day of discharge and pending at time of discharge. Please follow up results.   Diabetes Mellitus: Hemoglobin A1C on 04/28/14, day of discharge. Please repeat hemoglobin A1C on discharge to rule out lab error. If >6.4 please consider starting patient on Metformin as outpatient with close follow up.   HTN: Patient's blood pressure on admission was 156/66, but blood pressure improved to 114/56. Patient has been told in the past that she has hypertension but is not currently on any blood pressure medications. We continued to monitor the patient's blood pressure during admission and her blood pressure remained stable. Patient does not need to be on antihypertensives at this time.  Hyperlipidemia: Patient's last lipid profile showed Cholesterol 177, TG 98, HDL 57, LDL 100. Patient is not currently on statin therapy, but with a new potential diagnosis of diabetes mellitus, patient should be considered for moderate intensity statin. Please consider starting patient on statin therapy if repeat hemoglobin A1C >6.4.   OSA: Patient was recommended to have sleep study in the past, but the test was too expensive for her. Patient should consider getting this test in the future. She has a large neck circumference and obesity.   Discharge Vitals:   BP 124/65  Pulse 67  Temp(Src) 98.2 F (36.8 C) (Oral)  Resp 20  Ht 5\' 2"  (1.575 m)  Wt 134.083 kg (295 lb 9.6 oz)  BMI 54.05 kg/m2  SpO2 98%  LMP  09/15/2013  Discharge Labs:  Results for orders placed during the hospital encounter of 04/27/14 (from the past 24 hour(s))  TROPONIN I     Status: None   Collection Time    04/27/14  7:30 PM      Result Value Ref Range   Troponin I <0.30  <0.30 ng/mL  TROPONIN I     Status: None   Collection Time    04/28/14  1:20 AM      Result Value Ref Range   Troponin I <0.30  <0.30 ng/mL  RETICULOCYTES     Status: None   Collection Time    04/28/14 11:31 AM      Result Value Ref Range   Retic Ct Pct 1.1  0.4 - 3.1 %   RBC. 4.78  3.87 - 5.11 MIL/uL   Retic Count, Manual 52.6  19.0 - 186.0 K/uL    Signed: Osa Craver, DO PGY-1 Internal Medicine Resident Pager # [phone] 04/28/2014 4:21 PM

## 2014-04-28 NOTE — Progress Notes (Signed)
D/C orders received. Pt and spouse educated on d/c instructions and verbalized understanding. Pt handed d/c packet and prescription. IV and tele removed. Pt taken downstairs by staff via wheelchair.

## 2014-04-28 NOTE — H&P (Signed)
Medicine attending admission note: I personally interviewed and examined this patient, reviewed lab database and pertinent radiographic studies and I concur with the evaluation and management plan as outlined by resident physician Dr. Osa Craver.  Clinical summary: A 53 year old woman with borderline hypertension on no meds, morbid obesity, and hyperlipidemia. She is a nonsmoker. Not diabetic. She presents with a one-week history of generalized weakness , numbness on the left side of her face and the lateral digits of her left hand which occurred at the time of a headache. She had an aura with flashing lights. Photosensitivity. Dizziness. No prior history of stroke or migraine headache. While under evaluation in the emergency department she developed nonradiating left-sided chest pressure and pain lasting for about 10 minutes. Of note she did have a cardiac catheterization in 2013 which showed calcified small vessels but no dominant vessels were obstructed. On initial exam she was in no acute distress blood pressure initially 156/66 but repeat 127/75 then 119/74, pulse 72 regular, afebrile, respirations 22, oxygen saturation 97 at 100% on room air. She had a normal cardiopulmonary exam. She did have chest wall tenderness on palpation along the left sternal border. Neurologic exam normal with no focal deficits including light touch.  Pertinent lab with hemoglobin 11.4 hematocrit 35.1, MCV 75, Noncontrast CT scan of the head normal Electrocardiogram which I personally reviewed normal sinus rhythm nonspecific ST-T wave changes no acute ischemic change and no change from prior tracings  She was seen in consultation by neurology. Her  symptoms felt to be compatible with atypical migraine and not stroke. Recommendation was for Toradol 30 mg IV, Compazine, and Decadron. With these maneuvers, the patient rapidly has become asymptomatic.  Current exam: Pleasant, morbidly obese woman, no distress, Blood  pressure 146/76, pulse 68, temperature 98.1 F (36.7 C), temperature source Oral, resp. rate 20, height 5\' 2"  (1.575 m), weight 295 lb 9.6 oz (134.083 kg), last menstrual period 09/15/2013, SpO2 98.00%. Physical findings unchanged from those reported by Dr. Marvel Plan. I detect no focal neurologic deficits at this time.  Impression: #1. Likely. Migraine variant: Rapid response to symptomatic treatment with steroids and nonsteroidal agents. - stable for followup as an outpatient. #2. Transient atypical chest pain with no evidence for acute ischemia #3 Morbid obesity #4. Microcytic anemia: We will initiate laboratory workup in the hospital and then refer her for a routine colonoscopy as an outpatient.  Murriel Hopper, MD, [place]  Hematology-Oncology/Internal Medicine

## 2014-04-28 NOTE — Progress Notes (Signed)
UR completed 

## 2014-04-28 NOTE — Progress Notes (Signed)
See separate attending note

## 2014-04-28 NOTE — Discharge Summary (Signed)
Medicine Attending I personally examined this patient on the day of discharge and I concur with the evaluation and discharge plan as recorded by resident physician Dr Osa Craver. Murriel Hopper, MD, [place]  Hematology-Oncology/Internal Medicine

## 2014-04-28 NOTE — Progress Notes (Signed)
Subjective: Patient is now HA free and having no symptoms.   Objective: Current vital signs: BP 121/59  Pulse 61  Temp(Src) 98.6 F (37 C) (Oral)  Resp 20  Ht 5\' 2"  (1.575 m)  Wt 134.083 kg (295 lb 9.6 oz)  BMI 54.05 kg/m2  SpO2 96%  LMP 09/15/2013 Vital signs in last 24 hours: Temp:  [98.1 F (36.7 C)-98.8 F (37.1 C)] 98.6 F (37 C) (10/12 1900) Pulse Rate:  [59-70] 61 (10/13 0600) Resp:  [16-20] 20 (10/13 0600) BP: (104-129)/(56-75) 121/59 mmHg (10/13 0600) SpO2:  [90 %-100 %] 96 % (10/13 0600) Weight:  [132.314 kg (291 lb 11.2 oz)-134.083 kg (295 lb 9.6 oz)] 134.083 kg (295 lb 9.6 oz) (10/13 0500)  Intake/Output from previous day:   Intake/Output this shift: Total I/O In: 240 [P.O.:240] Out: -  Nutritional status: Carb Control  Neurologic Exam: General: NAD Mental Status: Alert, oriented, thought content appropriate.  Speech fluent without evidence of aphasia.  Able to follow 3 step commands without difficulty. Cranial Nerves: II: Discs flat bilaterally; Visual fields grossly normal, pupils equal, round, reactive to light and accommodation III,IV, VI: ptosis not present, extra-ocular motions intact bilaterally V,VII: smile symmetric, facial light touch sensation normal bilaterally VIII: hearing normal bilaterally IX,X: gag reflex present XI: bilateral shoulder shrug XII: midline tongue extension without atrophy or fasciculations  Motor: Right : Upper extremity   5/5    Left:     Upper extremity   5/5  Lower extremity   5/5     Lower extremity   5/5 Tone and bulk:normal tone throughout; no atrophy noted Sensory: Pinprick and light touch intact throughout, bilaterally Deep Tendon Reflexes:  1+ throughout  Plantars: Right: downgoing   Left: downgoing Cerebellar: normal finger-to-nose,  normal heel-to-shin test     Lab Results: Basic Metabolic Panel:  Recent Labs Lab 04/27/14 1000  NA 142  K 3.8  CL 99  CO2 31  GLUCOSE 104*  BUN 15  CREATININE  0.88  CALCIUM 9.4    Liver Function Tests:  Recent Labs Lab 04/27/14 1000  AST 22  ALT 13  ALKPHOS 79  BILITOT 0.2*  PROT 7.9  ALBUMIN 3.4*   No results found for this basename: LIPASE, AMYLASE,  in the last 168 hours No results found for this basename: AMMONIA,  in the last 168 hours  CBC:  Recent Labs Lab 04/27/14 1000  WBC 4.3  NEUTROABS 2.6  HGB 11.4*  HCT 35.1*  MCV 75.2*  PLT 285    Cardiac Enzymes:  Recent Labs Lab 04/27/14 1508 04/27/14 1930 04/28/14 0120  TROPONINI <0.30 <0.30 <0.30    Lipid Panel: No results found for this basename: CHOL, TRIG, HDL, CHOLHDL, VLDL, LDLCALC,  in the last 168 hours  CBG: No results found for this basename: GLUCAP,  in the last 168 hours  Microbiology: Results for orders placed during the hospital encounter of 03/25/09  RAPID STREP SCREEN     Status: None   Collection Time    03/25/09 10:25 PM      Result Value Ref Range Status   Streptococcus, Group A Screen (Direct) NEGATIVE  NEGATIVE Final    Coagulation Studies: No results found for this basename: LABPROT, INR,  in the last 72 hours  Imaging: Ct Head Wo Contrast  04/27/2014   CLINICAL DATA:  Headache, facial numbness, and LEFT leg numbness for 1 month.  EXAM: CT HEAD WITHOUT CONTRAST  TECHNIQUE: Contiguous axial images were obtained from the base of  the skull through the vertex without contrast.  COMPARISON:  10/31/2013.  FINDINGS: Normal appearance of the intracranial structures. No evidence for acute hemorrhage, mass lesion, midline shift, hydrocephalus or large infarct. No acute bony abnormality. The visualized sinuses are clear.  IMPRESSION: No acute intracranial abnormality. Negative exam. No change from priors.   Electronically Signed   By: Rolla Flatten M.D.   On: 04/27/2014 10:28   Mr Brain Wo Contrast  04/27/2014   CLINICAL DATA:  Progressive headaches over the last week none result with over the counter medications. Left-sided numbness and tingling  involving her face and left upper extremity extending to the left third and fourth digits. Dizziness and slurred speech. Sensitivity to light and sound.  EXAM: MRI HEAD WITHOUT CONTRAST  TECHNIQUE: Multiplanar, multiecho pulse sequences of the brain and surrounding structures were obtained without intravenous contrast.  COMPARISON:  CT head without contrast 04/27/2014. MRI brain 11/13/2003  FINDINGS: No acute infarct, hemorrhage, or mass lesion is present. The ventricles are of normal size. No significant extraaxial fluid collection is present. Midline structures are within normal limits. The cerebellar tonsils extend to the foramen magnum without extension. Flow is present in the major intracranial arteries. The globes and orbits are intact. The paranasal sinuses and mastoid air cells are clear.  IMPRESSION: Negative MRI of the brain.   Electronically Signed   By: Lawrence Santiago M.D.   On: 04/27/2014 15:39    Medications:  Scheduled: . aspirin EC  81 mg Oral Daily  . enoxaparin (LOVENOX) injection  40 mg Subcutaneous Q24H  . Influenza vac split quadrivalent PF  0.5 mL Intramuscular Tomorrow-1000  . pneumococcal 23 valent vaccine  0.5 mL Intramuscular Tomorrow-1000  . sodium chloride  3 mL Intravenous Q12H    Assessment/Plan:  53 YO female with history of migraine HA presenting with HA and left sided numbness. Now HA and symptoms free. At this time no further recommendations.  Patient will need out patient follow up with neurology. Neurology S/O    Etta Quill PA-C [facility] [phone]  04/28/2014, 9:57 AM

## 2014-04-28 NOTE — Discharge Instructions (Signed)
Thank you for allowing [place] to be involved in your healthcare while you were hospitalized at [facility].   Please note that there have been changes to your home medications.  --> PLEASE LOOK AT YOUR DISCHARGE MEDICATION LIST FOR DETAILS.  Please call your PCP if you have any questions or concerns, or any difficulty getting any of your medications.  Please return to the ER if you have worsening of your symptoms or new severe symptoms arise.  We have started you on two new medications:   1. Take Topamax 50 mg twice a day EVERY DAY to PREVENT migraines 2. Take Imitrex (sumatriptan) 25-100 mg when you HAVE A MIGRAINE to get RID of it.  Migraine Headache A migraine headache is an intense, throbbing pain on one or both sides of your head. A migraine can last for 30 minutes to several hours. CAUSES  The exact cause of a migraine headache is not always known. However, a migraine may be caused when nerves in the brain become irritated and release chemicals that cause inflammation. This causes pain. Certain things may also trigger migraines, such as:  Alcohol.  Smoking.  Stress.  Menstruation.  Aged cheeses.  Foods or drinks that contain nitrates, glutamate, aspartame, or tyramine.  Lack of sleep.  Chocolate.  Caffeine.  Hunger.  Physical exertion.  Fatigue.  Medicines used to treat chest pain (nitroglycerine), birth control pills, estrogen, and some blood pressure medicines. SIGNS AND SYMPTOMS  Pain on one or both sides of your head.  Pulsating or throbbing pain.  Severe pain that prevents daily activities.  Pain that is aggravated by any physical activity.  Nausea, vomiting, or both.  Dizziness.  Pain with exposure to bright lights, loud noises, or activity.  General sensitivity to bright lights, loud noises, or smells. Before you get a migraine, you may get warning signs that a migraine is coming (aura). An aura may include:  Seeing flashing  lights.  Seeing bright spots, halos, or zigzag lines.  Having tunnel vision or blurred vision.  Having feelings of numbness or tingling.  Having trouble talking.  Having muscle weakness. DIAGNOSIS  A migraine headache is often diagnosed based on:  Symptoms.  Physical exam.  A CT scan or MRI of your head. These imaging tests cannot diagnose migraines, but they can help rule out other causes of headaches. TREATMENT Medicines may be given for pain and nausea. Medicines can also be given to help prevent recurrent migraines.  HOME CARE INSTRUCTIONS  Only take over-the-counter or prescription medicines for pain or discomfort as directed by your health care provider. The use of long-term narcotics is not recommended.  Lie down in a dark, quiet room when you have a migraine.  Keep a journal to find out what may trigger your migraine headaches. For example, write down:  What you eat and drink.  How much sleep you get.  Any change to your diet or medicines.  Limit alcohol consumption.  Quit smoking if you smoke.  Get 7-9 hours of sleep, or as recommended by your health care provider.  Limit stress.  Keep lights dim if bright lights bother you and make your migraines worse. SEEK IMMEDIATE MEDICAL CARE IF:   Your migraine becomes severe.  You have a fever.  You have a stiff neck.  You have vision loss.  You have muscular weakness or loss of muscle control.  You start losing your balance or have trouble walking.  You feel faint or pass out.  You have severe symptoms that are different from your first symptoms. MAKE SURE YOU:   Understand these instructions.  Will watch your condition.  Will get help right away if you are not doing well or get worse. Document Released: 07/03/2005 Document Revised: 11/17/2013 Document Reviewed: 03/10/2013 [facility] Patient Information 2015 [place], [place]. This information is not intended to replace advice given to you by your  health care provider. Make sure you discuss any questions you have with your health care provider.

## 2014-04-28 NOTE — Progress Notes (Signed)
Subjective:  Patient was seen and examined this morning. Patient states her migraine, numbness, and weakness are completely resolved. She has had no recurrent chest pain, nausea or shortness of breath. Patient has no complaints at this time.   Objective: Vital signs in last 24 hours: Filed Vitals:   04/28/14 0400 04/28/14 0500 04/28/14 0600 04/28/14 1026  BP: 129/70  121/59 146/76  Pulse: 59  61 68  Temp:    98.1 F (36.7 C)  TempSrc:    Oral  Resp:   20 20  Height:      Weight:  134.083 kg (295 lb 9.6 oz)    SpO2: 97%  96% 98%   Weight change:   Intake/Output Summary (Last 24 hours) at 04/28/14 1116 Last data filed at 04/28/14 0820  Gross per 24 hour  Intake    240 ml  Output      0 ml  Net    240 ml   General: Vital signs reviewed.  Patient is well-developed and well-nourished, in no acute distress and cooperative with exam.  Cardiovascular: RRR, S1 normal, S2 normal, no murmurs, gallops, or rubs. Pulmonary/Chest: Clear to auscultation bilaterally, no wheezes, rales, or rhonchi. Abdominal: Soft, non-tender, obese, BS +, no masses, organomegaly, or guarding present.  Musculoskeletal: No joint deformities, erythema, or stiffness, ROM full and nontender. Extremities: No lower extremity edema bilaterally,  pulses symmetric and intact bilaterally. No cyanosis or clubbing. Neurological: A&O x3, Strength is normal and symmetric bilaterally, cranial nerve II-XII are grossly intact, no focal motor deficit, sensory intact to light touch bilaterally.  Skin: Warm, dry and intact. No rashes or erythema. Psychiatric: Normal mood and affect. speech and behavior is normal. Cognition and memory are normal.   Lab Results: Basic Metabolic Panel:  Recent Labs Lab 04/27/14 1000  NA 142  K 3.8  CL 99  CO2 31  GLUCOSE 104*  BUN 15  CREATININE 0.88  CALCIUM 9.4   Liver Function Tests:  Recent Labs Lab 04/27/14 1000  AST 22  ALT 13  ALKPHOS 79  BILITOT 0.2*  PROT 7.9    ALBUMIN 3.4*   CBC:  Recent Labs Lab 04/27/14 1000  WBC 4.3  NEUTROABS 2.6  HGB 11.4*  HCT 35.1*  MCV 75.2*  PLT 285   Cardiac Enzymes:  Recent Labs Lab 04/27/14 1508 04/27/14 1930 04/28/14 0120  TROPONINI <0.30 <0.30 <0.30   Hemoglobin A1C:  Recent Labs Lab 04/27/14 1508  HGBA1C 6.5*   Alcohol Level:  Recent Labs Lab 04/27/14 1000  ETH <11   Urinalysis:  Recent Labs Lab 04/27/14 1151  COLORURINE YELLOW  LABSPEC 1.016  PHURINE 7.0  GLUCOSEU NEGATIVE  HGBUR NEGATIVE  BILIRUBINUR NEGATIVE  KETONESUR NEGATIVE  PROTEINUR NEGATIVE  UROBILINOGEN 0.2  NITRITE NEGATIVE  LEUKOCYTESUR NEGATIVE   Studies/Results: Ct Head Wo Contrast  04/27/2014   CLINICAL DATA:  Headache, facial numbness, and LEFT leg numbness for 1 month.  EXAM: CT HEAD WITHOUT CONTRAST  TECHNIQUE: Contiguous axial images were obtained from the base of the skull through the vertex without contrast.  COMPARISON:  10/31/2013.  FINDINGS: Normal appearance of the intracranial structures. No evidence for acute hemorrhage, mass lesion, midline shift, hydrocephalus or large infarct. No acute bony abnormality. The visualized sinuses are clear.  IMPRESSION: No acute intracranial abnormality. Negative exam. No change from priors.   Electronically Signed   By: Rolla Flatten M.D.   On: 04/27/2014 10:28   Mr Brain Wo Contrast  04/27/2014   CLINICAL DATA:  Progressive headaches over the last week none result with over the counter medications. Left-sided numbness and tingling involving her face and left upper extremity extending to the left third and fourth digits. Dizziness and slurred speech. Sensitivity to light and sound.  EXAM: MRI HEAD WITHOUT CONTRAST  TECHNIQUE: Multiplanar, multiecho pulse sequences of the brain and surrounding structures were obtained without intravenous contrast.  COMPARISON:  CT head without contrast 04/27/2014. MRI brain 11/13/2003  FINDINGS: No acute infarct, hemorrhage, or mass  lesion is present. The ventricles are of normal size. No significant extraaxial fluid collection is present. Midline structures are within normal limits. The cerebellar tonsils extend to the foramen magnum without extension. Flow is present in the major intracranial arteries. The globes and orbits are intact. The paranasal sinuses and mastoid air cells are clear.  IMPRESSION: Negative MRI of the brain.   Electronically Signed   By: Lawrence Santiago M.D.   On: 04/27/2014 15:39   Medications:  I have reviewed the patient's current medications. Prior to Admission:  Prescriptions prior to admission  Medication Sig Dispense Refill  . acetaminophen (TYLENOL) 500 MG tablet Take 1,000 mg by mouth every 8 (eight) hours as needed (pain).       Scheduled Meds: . aspirin EC  81 mg Oral Daily  . enoxaparin (LOVENOX) injection  40 mg Subcutaneous Q24H  . ondansetron  4 mg Oral Once  . senna-docusate  2 tablet Oral Once  . sodium chloride  3 mL Intravenous Q12H  . topiramate  50 mg Oral BID   Continuous Infusions:  PRN Meds:.acetaminophen, acetaminophen Assessment/Plan: Principal Problem:   Atypical chest pain Active Problems:   Microcytic anemia   HTN (hypertension)   Hyperlipidemia   Migraine headache Complex Migraine: CT head and MRI brain were negative. Patient was seen by Neurology who recommended no further stroke work up and treat for complex migraine with Toradol 30 mg IV, compazine 10 mg IV, and decadron 4 mg IV. Patient's headache, numbness, and weakness were completely resolved this morning. Patient has been referred to Dr. Tomi Likens in the past for migraines but has never followed up. We will start the patient on topamax 50 mg BID for migraine prophylaxis and Imitrex 25 mg 1 to 4 pills for migraine abortion. Patient will be discharged home today with follow up in our clinic. -Topamax 50 mg BID -Imitrex 25-100 mg for Migraine Abortion -Discharge to home -Follow up in [facility] clinic and with Dr.  Tomi Likens  Atypical Chest Pain: No more episodes of chest pain likely due to GERD or anxiety. EKG showed normal sinus rhythm and troponins were negative x 3. Patient stated this morning that her nausea was resolved but I later got a call from the nurse stating that the patient was requesting medications for nausea. Patient has had work up in the past for her chest pain and should be considered for outpatient stress test. -ASA 81 mg daily  -Zofran 4 mg po once -Outpatient Stress Test  Microcytic Anemia: Hemoglobin 11.4 on admission with MCV 75.2. We will pursue anemia work up with follow up outpatient.  -Anemia Panel -Outpatient colonoscopy referral  HTN: Patient's blood pressure on admission was 156/66, but blood pressure normally ranged from 100s/50s-120s/70s during stay. Currently, her blood pressure is well controlled of medications, but may need to be started in the future. -Stable  DVT/PE ppx: Lovenox SQ daily  Dispo: Disposition is deferred at this time, awaiting improvement of current medical problems.  Anticipated discharge in approximately 1-2 day(s).  The patient does not have a current PCP (No Pcp Per Patient) and does need an [facility] hospital follow-up appointment after discharge.  The patient does not have transportation limitations that hinder transportation to clinic appointments.  .Services Needed at time of discharge: Y = Yes, Blank = No PT:   OT:   RN:   Equipment:   Other:     LOS: 1 day   Osa Craver, DO PGY-1 Internal Medicine Resident Pager # [phone] 04/28/2014 11:16 AM

## 2014-05-13 ENCOUNTER — Encounter: Payer: Self-pay | Admitting: Internal Medicine

## 2014-05-13 ENCOUNTER — Ambulatory Visit (INDEPENDENT_AMBULATORY_CARE_PROVIDER_SITE_OTHER): Payer: BLUE CROSS/BLUE SHIELD | Admitting: Internal Medicine

## 2014-05-13 VITALS — BP 132/73 | HR 76 | Temp 98.2°F | Ht 62.0 in | Wt 299.7 lb

## 2014-05-13 DIAGNOSIS — R7309 Other abnormal glucose: Secondary | ICD-10-CM

## 2014-05-13 DIAGNOSIS — Z0000 Encounter for general adult medical examination without abnormal findings: Secondary | ICD-10-CM

## 2014-05-13 DIAGNOSIS — Z1239 Encounter for other screening for malignant neoplasm of breast: Secondary | ICD-10-CM | POA: Diagnosis not present

## 2014-05-13 DIAGNOSIS — Z1211 Encounter for screening for malignant neoplasm of colon: Secondary | ICD-10-CM

## 2014-05-13 DIAGNOSIS — R0789 Other chest pain: Secondary | ICD-10-CM

## 2014-05-13 DIAGNOSIS — R7303 Prediabetes: Secondary | ICD-10-CM

## 2014-05-13 DIAGNOSIS — I10 Essential (primary) hypertension: Secondary | ICD-10-CM

## 2014-05-13 DIAGNOSIS — IMO0002 Reserved for concepts with insufficient information to code with codable children: Secondary | ICD-10-CM

## 2014-05-13 DIAGNOSIS — G43709 Chronic migraine without aura, not intractable, without status migrainosus: Secondary | ICD-10-CM | POA: Diagnosis not present

## 2014-05-13 DIAGNOSIS — D509 Iron deficiency anemia, unspecified: Secondary | ICD-10-CM

## 2014-05-13 LAB — GLUCOSE, CAPILLARY: Glucose-Capillary: 100 mg/dL — ABNORMAL HIGH (ref 70–99)

## 2014-05-13 LAB — POCT GLYCOSYLATED HEMOGLOBIN (HGB A1C): Hemoglobin A1C: 6.5

## 2014-05-13 MED ORDER — SUMATRIPTAN SUCCINATE 25 MG PO TABS: 25.0000 mg | ORAL_TABLET | ORAL | Status: DC | PRN

## 2014-05-13 NOTE — Assessment & Plan Note (Signed)
Mammogram and colonoscopy referral today

## 2014-05-13 NOTE — Assessment & Plan Note (Addendum)
BP Readings from Last 3 Encounters:  05/13/14 132/73  04/28/14 124/65  11/26/13 146/84   Lab Results  Component Value Date   NA 142 04/27/2014   K 3.8 04/27/2014   CREATININE 0.88 04/27/2014   Assessment: Blood pressure control: controlled Progress toward BP goal:  at goal  Plan: Medications:  Not on any medication at this time

## 2014-05-13 NOTE — Assessment & Plan Note (Signed)
No migraine right now but feels like one may be coming on. Associated with facial numbness. Triggers strong scent.   Increase topamax to 25mg  daily and refilled imitrex that did provide relief.  Gave neurology information again to make an appointment

## 2014-05-13 NOTE — Patient Instructions (Signed)
General Instructions:  Please bring your medicines with you each time you come to clinic.  Medicines may include prescription medications, over-the-counter medications, herbal remedies, eye drops, vitamins, or other pills.  Please take topamax 25mg  daily (no need to take 1.2) Please try the imitrex tablet 25mg  when you have a migraine to see if it improves it in ~2 hours Please get your mammogram and colonoscopy on that Please get possible new wrist splints for your hands and wear at night Please make your neurology follow up appointment Follow up with Kingman, Dillon, DO. Call in 1 week. (call to make appointment for Neurology follow up)  Specialty: Neurology  Contact information:  [place]  [address] [place] [id number]  [phone]   Progress Toward Treatment Goals:  Treatment Goal 05/13/2014  Blood pressure at goal    Self Care Goals & Plans:  Self Care Goal 05/13/2014  Manage my medications take my medicines as prescribed; bring my medications to every visit  Eat healthy foods eat more vegetables; eat foods that are low in salt; eat baked foods instead of fried foods  Be physically active find an activity I enjoy    No flowsheet data found.   Care Management & Community Referrals:  No flowsheet data found.    Sumatriptan tablets What is this medicine? SUMATRIPTAN (soo ma TRIP tan) is used to treat migraines with or without aura. An aura is a strange feeling or visual disturbance that warns you of an attack. It is not used to prevent migraines. This medicine may be used for other purposes; ask your health care provider or pharmacist if you have questions. COMMON BRAND NAME(S): Imitrex What should I tell my health care provider before I take this medicine? They need to know if you have any of these conditions: -bowel disease or colitis -diabetes -family history of heart disease -fast or irregular heart beat -heart or blood vessel disease, angina  (chest pain), or previous heart attack -high blood pressure -high cholesterol -history of stroke, transient ischemic attacks (TIAs or mini-strokes), or intracranial bleeding -kidney or liver disease -overweight -poor circulation -postmenopausal or surgical removal of uterus and ovaries -Raynaud's disease -seizure disorder -an unusual or allergic reaction to sumatriptan, other medicines, foods, dyes, or preservatives -pregnant or trying to get pregnant -breast-feeding How should I use this medicine? Take this medicine by mouth with a glass of water. Follow the directions on the prescription label. This medicine is taken at the first symptoms of a migraine. It is not for everyday use. If your migraine headache returns after one dose, you can take another dose as directed. You must leave at least 2 hours between doses, and do not take more than 100 mg as a single dose. Do not take more than 200 mg total in any 24 hour period. If there is no improvement at all after the first dose, do not take a second dose without talking to your doctor or health care professional. Do not take your medicine more often than directed. Talk to your pediatrician regarding the use of this medicine in children. Special care may be needed. Overdosage: If you think you have taken too much of this medicine contact a poison control center or emergency room at once. NOTE: This medicine is only for you. Do not share this medicine with others. What if I miss a dose? This does not apply; this medicine is not for regular use. What may interact with this medicine? Do not take  this medicine with any of the following medicines: -amphetamine or cocaine -dihydroergotamine, ergotamine, ergoloid mesylates, methysergide, or ergot-type medication - do not take within 24 hours of taking sumatriptan -feverfew -MAOIs like Carbex, Eldepryl, Marplan, Nardil, and Parnate - do not take sumatriptan within 2 weeks of stopping MAOI  therapy -other migraine medicines like almotriptan, eletriptan, naratriptan, rizatriptan, zolmitriptan - do not take within 24 hours of taking sumatriptan -tryptophan This medicine may also interact with the following medications: -lithium -medicines for mental depression, anxiety or mood problems -medicines for weight loss such as dexfenfluramine, dextroamphetamine, fenfluramine, or sibutramine -St. John's wort This list may not describe all possible interactions. Give your health care provider a list of all the medicines, herbs, non-prescription drugs, or dietary supplements you use. Also tell them if you smoke, drink alcohol, or use illegal drugs. Some items may interact with your medicine. What should I watch for while using this medicine? Only take this medicine for a migraine headache. Take it if you get warning symptoms or at the start of a migraine attack. It is not for regular use to prevent migraine attacks. You may get drowsy or dizzy. Do not drive, use machinery, or do anything that needs mental alertness until you know how this medicine affects you. To reduce dizzy or fainting spells, do not sit or stand up quickly, especially if you are an older patient. Alcohol can increase drowsiness, dizziness and flushing. Avoid alcoholic drinks. Smoking cigarettes may increase the risk of heart-related side effects from using this medicine. If you take migraine medicines for 10 or more days a month, your migraines may get worse. Keep a diary of headache days and medicine use. Contact your healthcare professional if your migraine attacks occur more frequently. What side effects may I notice from receiving this medicine? Side effects that you should report to your doctor or health care professional as soon as possible: -allergic reactions like skin rash, itching or hives, swelling of the face, lips, or tongue -fast, slow, or irregular heart beat -hallucinations -increased or decreased blood  pressure -seizures -severe stomach pain and cramping, bloody diarrhea -signs and symptoms of a blood clot such as breathing problems; changes in vision; chest pain; severe, sudden headache; pain, swelling, warmth in the leg; trouble speaking; sudden numbness or weakness of the face, arm or leg -tingling, pain, or numbness in the face, hands or feet Side effects that usually do not require medical attention (report to your doctor or health care professional if they continue or are bothersome): -drowsiness -feeling warm, flushing, or redness of the face -headache -muscle cramps, pain -nausea, vomiting -unusually weak or tired This list may not describe all possible side effects. Call your doctor for medical advice about side effects. You may report side effects to FDA at 1-800-FDA-1088. Where should I keep my medicine? Keep out of the reach of children. Store at room temperature between 2 and 30 degrees C (36 and 86 degrees F). Throw away any unused medicine after the expiration date. NOTE: This sheet is a summary. It may not cover all possible information. If you have questions about this medicine, talk to your doctor, pharmacist, or health care provider.  2015, Elsevier/Gold Standard. (2013-03-04 10:12:47)

## 2014-05-13 NOTE — Progress Notes (Signed)
Patient ID: Hannah Huerta, female   DOB: 1961-05-30, 53 y.o.   MRN: [id number] Case discussed with Dr. Eula Fried soon after the resident saw the patient.  We reviewed the resident's history and exam and pertinent patient test results.  I agree with the assessment, diagnosis, and plan of care documented in the resident's note.

## 2014-05-13 NOTE — Assessment & Plan Note (Signed)
a1c 6.5 first time check during admission. No fasting glucose checked. Obese female.   -recheck a1c today, cbg fasting 100

## 2014-05-13 NOTE — Assessment & Plan Note (Signed)
Iron 57 and ferritin 82. Denies blood in stool.   Refer for colonoscopy today
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
## 2014-05-13 NOTE — Assessment & Plan Note (Signed)
Follow with cardiology as outpatient as planned  Currently no chest pain

## 2014-05-18 ENCOUNTER — Encounter: Payer: Self-pay | Admitting: Internal Medicine

## 2014-05-18 ENCOUNTER — Telehealth: Payer: Self-pay | Admitting: Neurology

## 2014-05-18 NOTE — Telephone Encounter (Signed)
Pt no showed twice on new patient appt so Dr Tomi Likens states that we are not allow to Resch appt

## 2014-05-19 ENCOUNTER — Ambulatory Visit
Admission: RE | Admit: 2014-05-19 | Discharge: 2014-05-19 | Disposition: A | Discharge status: Home / Self Care | Payer: BC Managed Care – PPO | Source: Ambulatory Visit | Attending: Internal Medicine | Admitting: Internal Medicine

## 2014-05-19 DIAGNOSIS — Z1239 Encounter for other screening for malignant neoplasm of breast: Secondary | ICD-10-CM

## 2014-05-19 LAB — GLUCOSE, CAPILLARY: Glucose-Capillary: 101 mg/dL — ABNORMAL HIGH (ref 70–99)

## 2014-05-27 ENCOUNTER — Encounter: Payer: Self-pay | Admitting: Internal Medicine

## 2014-06-01 ENCOUNTER — Encounter (HOSPITAL_COMMUNITY): Payer: Self-pay | Admitting: Emergency Medicine

## 2014-06-01 ENCOUNTER — Emergency Department (INDEPENDENT_AMBULATORY_CARE_PROVIDER_SITE_OTHER)
Admission: EM | Admit: 2014-06-01 | Discharge: 2014-06-01 | Disposition: A | Discharge status: Home / Self Care | Payer: BC Managed Care – PPO | Source: Home / Self Care | Attending: Family Medicine | Admitting: Family Medicine

## 2014-06-01 DIAGNOSIS — M5431 Sciatica, right side: Secondary | ICD-10-CM

## 2014-06-01 DIAGNOSIS — R030 Elevated blood-pressure reading, without diagnosis of hypertension: Secondary | ICD-10-CM

## 2014-06-01 DIAGNOSIS — IMO0001 Reserved for inherently not codable concepts without codable children: Secondary | ICD-10-CM

## 2014-06-01 MED ORDER — INDOMETHACIN 25 MG PO CAPS: 25.0000 mg | ORAL_CAPSULE | Freq: Two times a day (BID) | ORAL | Status: DC

## 2014-06-01 NOTE — ED Provider Notes (Signed)
CSN: [id number]     Arrival date & time 06/01/14  0847 History   First MD Initiated Contact with Patient 06/01/14 [phone]     Chief Complaint  Patient presents with  . Back Pain   (Consider location/radiation/quality/duration/timing/severity/associated sxs/prior Treatment) HPI Comments: PCP: [facility] Works in Teacher, adult education at [employer]  Patient is a 53 y.o. female presenting with back pain. The history is provided by the patient.  Back Pain Location:  Sacro-iliac joint Quality:  Burning Radiates to: right buttock. Pain severity:  Mild Onset quality:  Gradual Duration:  24 hours Timing:  Constant Progression:  Unchanged Chronicity:  Recurrent (Hx of chronic lower back pain since 2008) Ineffective treatments: little relief with use of tylenol. Associated symptoms: no abdominal pain, no bladder incontinence, no bowel incontinence, no dysuria, no fever, no numbness, no paresthesias, no perianal numbness, no tingling and no weakness   Risk factors: obesity     Past Medical History  Diagnosis Date  . Hypertension   . GERD (gastroesophageal reflux disease)   . Anemia   . Sickle cell trait   . Morbid obesity with BMI of 50.0-59.9, adult   . Chest pain     a. 02/2012 abnl myoview - inferoapical reverisbility concerning for ischemia, EF 59%;   02/2012 nonobstructive cath  . Abnormal Pap smear of cervix     pt couldnt state what type of abnormality   Past Surgical History  Procedure Laterality Date  . Cholecystectomy    . Tubal ligation    . Spine surgery      fusion  . Back surgery    . Bladder surgery     Family History  Problem Relation Age of Onset  . Coronary artery disease Father 8  . Heart disease Father   . Other Father     died in a MVA  . Hypertension Father   . Sudden death Mother 72    Questionable MI  . Diabetes Mother   . Hypertension Mother   . Hypertension Sister   . Cancer Maternal Aunt     pancreatic  . Stroke Paternal Grandmother   . Breast cancer Maternal Aunt      after age 73   History  Substance Use Topics  . Smoking status: Never Smoker   . Smokeless tobacco: Never Used  . Alcohol Use: No   OB History    Gravida Para Term Preterm AB TAB SAB Ectopic Multiple Living   5 3   2 2    3      Review of Systems  Constitutional: Negative for fever.  Gastrointestinal: Negative for abdominal pain and bowel incontinence.  Genitourinary: Negative for bladder incontinence and dysuria.  Musculoskeletal: Positive for back pain.  Neurological: Negative for tingling, weakness, numbness and paresthesias.    Allergies  Review of patient's allergies indicates no known allergies.  Home Medications   Prior to Admission medications   Medication Sig Start Date End Date Taking? Authorizing Provider  acetaminophen (TYLENOL) 500 MG tablet Take 1,000 mg by mouth every 8 (eight) hours as needed (pain).    Historical Provider, MD  aspirin EC 81 MG EC tablet Take 1 tablet (81 mg total) by mouth daily. 04/28/14   Osa Craver, MD  indomethacin (INDOCIN) 25 MG capsule Take 1 capsule (25 mg total) by mouth 2 (two) times daily with a meal. As needed for back pain 06/01/14   Lutricia Feil, PA  SUMAtriptan (IMITREX) 25 MG tablet Take 1 tablet (25 mg total)  by mouth every 2 (two) hours as needed for migraine or headache. Do not exceed >200mg  in a day 05/13/14   Wilber Oliphant, MD  topiramate (TOPAMAX) 25 MG tablet Take 1 tablet (25 mg total) by mouth at bedtime. 04/28/14   Alexa Marvel Plan, MD   BP 171/93 mmHg  Pulse 75  Temp(Src) 98.6 F (37 C) (Oral)  Resp 16  SpO2 96%  LMP 09/15/2013 Physical Exam  Constitutional: She is oriented to person, place, and time. She appears well-developed and well-nourished. No distress.  +obese  HENT:  Head: Normocephalic and atraumatic.  Eyes: Conjunctivae are normal. No scleral icterus.  Cardiovascular: Normal rate.   Pulmonary/Chest: Effort normal.  Musculoskeletal: Normal range of motion.       Lumbar back:  She exhibits tenderness. She exhibits normal range of motion, no swelling and no deformity.       Back:  No rash CSM exam of bilateral lower extremities normal.   Neurological: She is alert and oriented to person, place, and time.  Skin: Skin is warm and dry. No rash noted. No erythema.  Psychiatric: She has a normal mood and affect. Her behavior is normal.  Nursing note and vitals reviewed.   ED Course  Procedures (including critical care time) Labs Review Labs Reviewed - No data to display  Imaging Review No results found.   MDM   1. Sciatica neuralgia, right   2. Elevated blood pressure   +Mild sciatica (R). Will treat with indocin as prescribed. Advised to follow up with PCP for elevated BP. No clinical indication of acute neuromuscular deficits.     [place], [place] 06/01/14 [phone]

## 2014-06-01 NOTE — Discharge Instructions (Signed)
You should be aware that your blood pressure is elevated at today's visit and your should have this rechecked by your primary care office in the next 1-2 weeks.   Back Exercises Back exercises help treat and prevent back injuries. The goal of back exercises is to increase the strength of your abdominal and back muscles and the flexibility of your back. These exercises should be started when you no longer have back pain. Back exercises include:  Pelvic Tilt. Lie on your back with your knees bent. Tilt your pelvis until the lower part of your back is against the floor. Hold this position 5 to 10 sec and repeat 5 to 10 times.  Knee to Chest. Pull first 1 knee up against your chest and hold for 20 to 30 seconds, repeat this with the other knee, and then both knees. This may be done with the other leg straight or bent, whichever feels better.  Sit-Ups or Curl-Ups. Bend your knees 90 degrees. Start with tilting your pelvis, and do a partial, slow sit-up, lifting your trunk only 30 to 45 degrees off the floor. Take at least 2 to 3 seconds for each sit-up. Do not do sit-ups with your knees out straight. If partial sit-ups are difficult, simply do the above but with only tightening your abdominal muscles and holding it as directed.  Hip-Lift. Lie on your back with your knees flexed 90 degrees. Push down with your feet and shoulders as you raise your hips a couple inches off the floor; hold for 10 seconds, repeat 5 to 10 times.  Back arches. Lie on your stomach, propping yourself up on bent elbows. Slowly press on your hands, causing an arch in your low back. Repeat 3 to 5 times. Any initial stiffness and discomfort should lessen with repetition over time.  Shoulder-Lifts. Lie face down with arms beside your body. Keep hips and torso pressed to floor as you slowly lift your head and shoulders off the floor. Do not overdo your exercises, especially in the beginning. Exercises may cause you some mild back  discomfort which lasts for a few minutes; however, if the pain is more severe, or lasts for more than 15 minutes, do not continue exercises until you see your caregiver. Improvement with exercise therapy for back problems is slow.  See your caregivers for assistance with developing a proper back exercise program. Document Released: 08/10/2004 Document Revised: 09/25/2011 Document Reviewed: 05/04/2011 [facility] Patient Information 2015 [place], [place]. This information is not intended to replace advice given to you by your health care provider. Make sure you discuss any questions you have with your health care provider.  Back Pain, Adult Back pain is very common. The pain often gets better over time. The cause of back pain is usually not dangerous. Most people can learn to manage their back pain on their own.  HOME CARE   Stay active. Start with short walks on flat ground if you can. Try to walk farther each day.  Do not sit, drive, or stand in one place for more than 30 minutes. Do not stay in bed.  Do not avoid exercise or work. Activity can help your back heal faster.  Be careful when you bend or lift an object. Bend at your knees, keep the object close to you, and do not twist.  Sleep on a firm mattress. Lie on your side, and bend your knees. If you lie on your back, put a pillow under your knees.  Only take medicines as  told by your doctor.  Put ice on the injured area.  Put ice in a plastic bag.  Place a towel between your skin and the bag.  Leave the ice on for 15-20 minutes, 03-04 times a day for the first 2 to 3 days. After that, you can switch between ice and heat packs.  Ask your doctor about back exercises or massage.  Avoid feeling anxious or stressed. Find good ways to deal with stress, such as exercise. GET HELP RIGHT AWAY IF:   Your pain does not go away with rest or medicine.  Your pain does not go away in 1 week.  You have new problems.  You do not feel  well.  The pain spreads into your legs.  You cannot control when you poop (bowel movement) or pee (urinate).  Your arms or legs feel weak or lose feeling (numbness).  You feel sick to your stomach (nauseous) or throw up (vomit).  You have belly (abdominal) pain.  You feel like you may pass out (faint). MAKE SURE YOU:   Understand these instructions.  Will watch your condition.  Will get help right away if you are not doing well or get worse. Document Released: 12/20/2007 Document Revised: 09/25/2011 Document Reviewed: 11/04/2013 [facility] Patient Information 2015 [place], [place]. This information is not intended to replace advice given to you by your health care provider. Make sure you discuss any questions you have with your health care provider.  Hypertension Hypertension, commonly called high blood pressure, is when the force of blood pumping through your arteries is too strong. Your arteries are the blood vessels that carry blood from your heart throughout your body. A blood pressure reading consists of a higher number over a lower number, such as 110/72. The higher number (systolic) is the pressure inside your arteries when your heart pumps. The lower number (diastolic) is the pressure inside your arteries when your heart relaxes. Ideally you want your blood pressure below 120/80. Hypertension forces your heart to work harder to pump blood. Your arteries may become narrow or stiff. Having hypertension puts you at risk for heart disease, stroke, and other problems.  RISK FACTORS Some risk factors for high blood pressure are controllable. Others are not.  Risk factors you cannot control include:   Race. You may be at higher risk if you are African American.  Age. Risk increases with age.  Gender. Men are at higher risk than women before age 42 years. After age 66, women are at higher risk than men. Risk factors you can control include:  Not getting enough exercise or physical  activity.  Being overweight.  Getting too much fat, sugar, calories, or salt in your diet.  Drinking too much alcohol. SIGNS AND SYMPTOMS Hypertension does not usually cause signs or symptoms. Extremely high blood pressure (hypertensive crisis) may cause headache, anxiety, shortness of breath, and nosebleed. DIAGNOSIS  To check if you have hypertension, your health care provider will measure your blood pressure while you are seated, with your arm held at the level of your heart. It should be measured at least twice using the same arm. Certain conditions can cause a difference in blood pressure between your right and left arms. A blood pressure reading that is higher than normal on one occasion does not mean that you need treatment. If one blood pressure reading is high, ask your health care provider about having it checked again. TREATMENT  Treating high blood pressure includes making lifestyle changes and possibly taking  medicine. Living a healthy lifestyle can help lower high blood pressure. You may need to change some of your habits. Lifestyle changes may include:  Following the DASH diet. This diet is high in fruits, vegetables, and whole grains. It is low in salt, red meat, and added sugars.  Getting at least 2 hours of brisk physical activity every week.  Losing weight if necessary.  Not smoking.  Limiting alcoholic beverages.  Learning ways to reduce stress. If lifestyle changes are not enough to get your blood pressure under control, your health care provider may prescribe medicine. You may need to take more than one. Work closely with your health care provider to understand the risks and benefits. HOME CARE INSTRUCTIONS  Have your blood pressure rechecked as directed by your health care provider.   Take medicines only as directed by your health care provider. Follow the directions carefully. Blood pressure medicines must be taken as prescribed. The medicine does not work as  well when you skip doses. Skipping doses also puts you at risk for problems.   Do not smoke.   Monitor your blood pressure at home as directed by your health care provider. SEEK MEDICAL CARE IF:   You think you are having a reaction to medicines taken.  You have recurrent headaches or feel dizzy.  You have swelling in your ankles.  You have trouble with your vision. SEEK IMMEDIATE MEDICAL CARE IF:  You develop a severe headache or confusion.  You have unusual weakness, numbness, or feel faint.  You have severe chest or abdominal pain.  You vomit repeatedly.  You have trouble breathing. MAKE SURE YOU:   Understand these instructions.  Will watch your condition.  Will get help right away if you are not doing well or get worse. Document Released: 07/03/2005 Document Revised: 11/17/2013 Document Reviewed: 04/25/2013 [facility] Patient Information 2015 [place], [place]. This information is not intended to replace advice given to you by your health care provider. Make sure you discuss any questions you have with your health care provider.

## 2014-06-01 NOTE — ED Notes (Signed)
Right lower back pain that significantly worsened.  Slight burning with urination, no frequency or urgency.  Last bm was this am and normal per patient, no vaginal discharge.  Reports continued vaginal irritation

## 2014-06-25 ENCOUNTER — Encounter (HOSPITAL_COMMUNITY): Payer: Self-pay | Admitting: Cardiology

## 2014-07-14 ENCOUNTER — Telehealth: Payer: Self-pay | Admitting: *Deleted

## 2014-07-14 NOTE — Telephone Encounter (Addendum)
Dr Hilarie Fredrickson, This pt has a BMI of 54.9 as of 05-13-2014.  Pt has a Pv for 07-21-14 Tuesday and She is a direct colon with you on Tuesday 08-04-2014. She has hx of anemia, hypertension, palpitations, atypical chest pain and migraines. She was admitted 04-2014 to [facility] with HA, chest pain, triponins neg x3. Past hx cardiac cath 2013 EF 55-% with left ventricular diastolic dysfunction. Echo march 2015 with EF 55% as well. Do you want to see her in the office or is a direct hospital colonoscopy okay with you?     Please advise,  Thanks marie PV

## 2014-07-20 NOTE — Telephone Encounter (Signed)
I would have her see an APP to discuss and schedule hospital procedure with MAC if felt appropriate

## 2014-07-20 NOTE — Telephone Encounter (Signed)
Spoke with pt's husband. Explained to him with her medical history and her BMI> 24 Dr Hilarie Fredrickson wants her to see APP in the office and then schedule a hospital colon with Prop if possible. Cancelled PV and colon as scheduled for Pv 1-5 and colon 1-19 and scheduled an OV with APP 1-14 Thursday at 0830 am when pt is off per husband. Instructed husband her hospital colon will be scheduled from this OV. He verbalized understanding of this and all instructions given today.  Lelan Pons PV

## 2014-07-30 ENCOUNTER — Encounter: Payer: Self-pay | Admitting: Gastroenterology

## 2014-07-30 ENCOUNTER — Ambulatory Visit: Payer: Self-pay | Admitting: Nurse Practitioner

## 2014-08-04 ENCOUNTER — Ambulatory Visit (INDEPENDENT_AMBULATORY_CARE_PROVIDER_SITE_OTHER): Payer: BLUE CROSS/BLUE SHIELD | Admitting: Nurse Practitioner

## 2014-08-04 ENCOUNTER — Encounter: Payer: Self-pay | Admitting: Nurse Practitioner

## 2014-08-04 ENCOUNTER — Encounter: Payer: BC Managed Care – PPO | Admitting: Internal Medicine

## 2014-08-04 VITALS — BP 150/80 | HR 68 | Ht 62.0 in | Wt 299.1 lb

## 2014-08-04 DIAGNOSIS — Z1211 Encounter for screening for malignant neoplasm of colon: Secondary | ICD-10-CM

## 2014-08-04 MED ORDER — NA SULFATE-K SULFATE-MG SULF 17.5-3.13-1.6 GM/177ML PO SOLN: 1.0000 | Freq: Once | ORAL | Status: DC

## 2014-08-04 NOTE — Patient Instructions (Signed)
You have been scheduled at [facility] for your Colonoscopy Separate instructions have been given Suprep has been sent to your pharmacy

## 2014-08-05 ENCOUNTER — Encounter: Payer: Self-pay | Admitting: Nurse Practitioner

## 2014-08-05 DIAGNOSIS — Z1211 Encounter for screening for malignant neoplasm of colon: Secondary | ICD-10-CM | POA: Insufficient documentation

## 2014-08-05 NOTE — Progress Notes (Signed)
HPI : Patient is 54 year old female, referred for evaluation of colon cancer screening. She has a history of morbid obesity, hypertension , migraine headaches, and diabetes. Patient was hospitalized in October with chest pain. She had a cardiac catheter in 2013 revealing nonobstructive coronary artery disease. Ejection fraction 55% on 2D echo March 2015.   Patient has no GI complaints. Specifically, she has no abdominal pain, nausea vomiting, bowel changes, blood in stool. She is only on 2 3 medications on a regular basis   Past Medical History  Diagnosis Date  . Hypertension   . GERD (gastroesophageal reflux disease)   . Anemia   . Sickle cell trait   . Morbid obesity with BMI of 50.0-59.9, adult   . Chest pain     a. 02/2012 abnl myoview - inferoapical reverisbility concerning for ischemia, EF 59%;   02/2012 nonobstructive cath  . Abnormal Pap smear of cervix     pt couldnt state what type of abnormality  . HLD (hyperlipidemia)   . Chronic migraine   . Prediabetes     Family History  Problem Relation Age of Onset  . Coronary artery disease Father 70  . Heart disease Father   . Other Father     died in a MVA  . Hypertension Father   . Sudden death Mother 69    Questionable MI  . Diabetes Mother   . Hypertension Mother   . Hypertension Sister   . Pancreatic cancer Maternal Aunt   . Stroke Paternal Grandmother   . Breast cancer Maternal Aunt     after age 59  . Colon cancer Neg Hx   . Colon polyps Sister   . Gallbladder disease Neg Hx   . Esophageal cancer Neg Hx    History  Substance Use Topics  . Smoking status: Never Smoker   . Smokeless tobacco: Never Used  . Alcohol Use: No   Current Outpatient Prescriptions  Medication Sig Dispense Refill  . acetaminophen (TYLENOL) 500 MG tablet Take 1,000 mg by mouth every 8 (eight) hours as needed (pain).    [name] aspirin EC 81 MG EC tablet Take 1 tablet (81 mg total) by mouth daily. 30 tablet 0  . indomethacin (INDOCIN) 25  MG capsule Take 1 capsule (25 mg total) by mouth 2 (two) times daily with a meal. As needed for back pain 30 capsule 0  . topiramate (TOPAMAX) 25 MG tablet Take 1 tablet (25 mg total) by mouth at bedtime. 30 tablet 0  . Na Sulfate-K Sulfate-Mg Sulf (SUPREP BOWEL PREP) SOLN Take 1 kit by mouth once. 1 Bottle 0   No current facility-administered medications for this visit.   Facility-Administered Medications Ordered in Other Visits  Medication Dose Route Frequency Provider Last Rate Last Dose  . topiramate (TOPAMAX) tablet 25 mg  25 mg Oral QHS Alexa Richardson, MD       No Known Allergies   Review of Systems: All systems reviewed and negative except where noted in HPI.   Physical Exam: BP 150/80 mmHg  Pulse 68  Ht 5' 2" (1.575 m)  Wt 299 lb 2 oz (135.682 kg)  BMI 54.70 kg/m2  LMP 09/15/2013 Constitutional: Pleasant, obese black female in no acute distress. HEENT: Normocephalic and atraumatic. Conjunctivae are normal. No scleral icterus. Neck supple.  Cardiovascular: Normal rate, regular rhythm.  Pulmonary/chest: Effort normal and breath sounds normal. No wheezing, rales or rhonchi. Abdominal: Soft, obese, nontender. Bowel sounds active throughout. There are no masses palpable. No  hepatomegaly. Extremities: 1-2+ BLE edema Lymphadenopathy: No cervical adenopathy noted. Neurological: Alert and oriented to person place and time. Skin: Skin is warm and dry. No rashes noted. Psychiatric: Normal mood and affect. Behavior is normal.   ASSESSMENT AND PLAN:  1. Pleasant, morbidly obese 54 year old female for colon cancer screening. Given BMI greater than 50 procedure will need to be done at [facility]. The risks, benefits, and alternatives to colonoscopy with possible biopsy and possible polypectomy were discussed with the patient and she consents to proceed.   2. Chronic microcytic anemia, stable. This dates back at least 7 years.

## 2014-08-06 NOTE — Progress Notes (Signed)
Agree with Ms. Guenther's assessment and plan. Carl E. Gessner, MD, FACG   

## 2014-08-12 ENCOUNTER — Encounter: Payer: Self-pay | Admitting: Internal Medicine

## 2014-08-12 ENCOUNTER — Encounter: Payer: BC Managed Care – PPO | Admitting: Internal Medicine

## 2014-08-27 ENCOUNTER — Encounter (HOSPITAL_COMMUNITY): Payer: Self-pay | Admitting: *Deleted

## 2014-09-03 NOTE — Anesthesia Preprocedure Evaluation (Addendum)
Anesthesia Evaluation  Patient identified by MRN, date of birth, ID band Patient awake    Reviewed: Allergy & Precautions, H&P , Patient's Chart, lab work & pertinent test results, reviewed documented beta blocker date and time   History of Anesthesia Complications (+) history of anesthetic complications  Airway Mallampati: II  TM Distance: >3 FB Neck ROM: full    Dental no notable dental hx.    Pulmonary  breath sounds clear to auscultation  Pulmonary exam normal       Cardiovascular hypertension, On Medications Rhythm:regular Rate:Normal     Neuro/Psych    GI/Hepatic GERD-  Medicated,  Endo/Other  Morbid obesity  Renal/GU      Musculoskeletal   Abdominal   Peds  Hematology   Anesthesia Other Findings Hypertension GERD (gastroesophageal reflux disease)    Anemia    Morbid obesity with BMI of 50.0-59.9, adult  Chest pain 02/2012 abnl myoview - inferoapical reverisbility concerning for ischemia, EF 59%;  02/2012 nonobstructive cath HLD (hyperlipidemia)             Reproductive/Obstetrics                           Anesthesia Physical Anesthesia Plan  ASA: III  Anesthesia Plan: General   Post-op Pain Management:    Induction: Intravenous  Airway Management Planned: LMA and Mask  Additional Equipment:   Intra-op Plan:   Post-operative Plan: Extubation in OR  Informed Consent: I have reviewed the patients History and Physical, chart, labs and discussed the procedure including the risks, benefits and alternatives for the proposed anesthesia with the patient or authorized representative who has indicated his/her understanding and acceptance.   Dental Advisory Given and Dental advisory given  Plan Discussed with: CRNA and Surgeon  Anesthesia Plan Comments: (Discussed GA with LMA, possible sore throat, potential need to switch to ETT, N/V, pulmonary aspiration. Questions  answered. )       Anesthesia Quick Evaluation

## 2014-09-04 ENCOUNTER — Ambulatory Visit (HOSPITAL_COMMUNITY): Payer: BLUE CROSS/BLUE SHIELD | Admitting: Anesthesiology

## 2014-09-04 ENCOUNTER — Encounter (HOSPITAL_COMMUNITY): Payer: Self-pay | Admitting: Internal Medicine

## 2014-09-04 ENCOUNTER — Ambulatory Visit (HOSPITAL_COMMUNITY)
Admission: RE | Admit: 2014-09-04 | Discharge: 2014-09-04 | Disposition: A | Discharge status: Home / Self Care | Payer: BLUE CROSS/BLUE SHIELD | Source: Ambulatory Visit | Attending: Internal Medicine | Admitting: Internal Medicine

## 2014-09-04 ENCOUNTER — Encounter (HOSPITAL_COMMUNITY)
Admission: RE | Disposition: A | Discharge status: Home / Self Care | Payer: BLUE CROSS/BLUE SHIELD | Source: Ambulatory Visit | Attending: Internal Medicine

## 2014-09-04 DIAGNOSIS — I2510 Atherosclerotic heart disease of native coronary artery without angina pectoris: Secondary | ICD-10-CM | POA: Diagnosis not present

## 2014-09-04 DIAGNOSIS — G43909 Migraine, unspecified, not intractable, without status migrainosus: Secondary | ICD-10-CM | POA: Diagnosis not present

## 2014-09-04 DIAGNOSIS — K219 Gastro-esophageal reflux disease without esophagitis: Secondary | ICD-10-CM | POA: Insufficient documentation

## 2014-09-04 DIAGNOSIS — K648 Other hemorrhoids: Secondary | ICD-10-CM | POA: Diagnosis not present

## 2014-09-04 DIAGNOSIS — Z1211 Encounter for screening for malignant neoplasm of colon: Secondary | ICD-10-CM | POA: Diagnosis not present

## 2014-09-04 DIAGNOSIS — E119 Type 2 diabetes mellitus without complications: Secondary | ICD-10-CM | POA: Insufficient documentation

## 2014-09-04 DIAGNOSIS — Z833 Family history of diabetes mellitus: Secondary | ICD-10-CM | POA: Insufficient documentation

## 2014-09-04 DIAGNOSIS — I10 Essential (primary) hypertension: Secondary | ICD-10-CM | POA: Diagnosis not present

## 2014-09-04 DIAGNOSIS — D500 Iron deficiency anemia secondary to blood loss (chronic): Secondary | ICD-10-CM | POA: Diagnosis not present

## 2014-09-04 DIAGNOSIS — K5730 Diverticulosis of large intestine without perforation or abscess without bleeding: Secondary | ICD-10-CM | POA: Diagnosis not present

## 2014-09-04 DIAGNOSIS — Z6841 Body Mass Index (BMI) 40.0 and over, adult: Secondary | ICD-10-CM | POA: Diagnosis not present

## 2014-09-04 DIAGNOSIS — Z7982 Long term (current) use of aspirin: Secondary | ICD-10-CM | POA: Diagnosis not present

## 2014-09-04 DIAGNOSIS — E785 Hyperlipidemia, unspecified: Secondary | ICD-10-CM | POA: Insufficient documentation

## 2014-09-04 DIAGNOSIS — Z79899 Other long term (current) drug therapy: Secondary | ICD-10-CM | POA: Diagnosis not present

## 2014-09-04 DIAGNOSIS — K644 Residual hemorrhoidal skin tags: Secondary | ICD-10-CM | POA: Insufficient documentation

## 2014-09-04 DIAGNOSIS — Z8249 Family history of ischemic heart disease and other diseases of the circulatory system: Secondary | ICD-10-CM | POA: Insufficient documentation

## 2014-09-04 HISTORY — PX: COLONOSCOPY WITH PROPOFOL: SHX5780

## 2014-09-04 SURGERY — COLONOSCOPY WITH PROPOFOL
Anesthesia: General

## 2014-09-04 MED ORDER — PROPOFOL INFUSION 10 MG/ML OPTIME
INTRAVENOUS | Status: DC | PRN
  Administered 2014-09-04: 120 ug/kg/min via INTRAVENOUS

## 2014-09-04 MED ORDER — PROPOFOL 10 MG/ML IV BOLUS
INTRAVENOUS | Status: DC | PRN
  Administered 2014-09-04: 20 mg via INTRAVENOUS
  Administered 2014-09-04: 50 mg via INTRAVENOUS
  Administered 2014-09-04: 20 mg via INTRAVENOUS

## 2014-09-04 MED ORDER — LACTATED RINGERS IV SOLN
INTRAVENOUS | Status: DC
  Administered 2014-09-04: 1000 mL via INTRAVENOUS

## 2014-09-04 MED ORDER — SODIUM CHLORIDE 0.9 % IV SOLN: INTRAVENOUS | Status: DC

## 2014-09-04 MED ORDER — PROPOFOL 10 MG/ML IV BOLUS
INTRAVENOUS | Status: AC
  Filled 2014-09-04: qty 20

## 2014-09-04 SURGICAL SUPPLY — 21 items

## 2014-09-04 NOTE — Anesthesia Postprocedure Evaluation (Signed)
  Anesthesia Post-op Note  Patient: Hannah Huerta  Procedure(s) Performed: Procedure(s): COLONOSCOPY WITH PROPOFOL (N/A)  Patient is awake and responsive. Pain and nausea are reasonably well controlled. Vital signs are stable and clinically acceptable. Oxygen saturation is clinically acceptable. There are no apparent anesthetic complications at this time. Patient is ready for discharge.

## 2014-09-04 NOTE — Op Note (Addendum)
[facility] [place], [address]   COLONOSCOPY PROCEDURE REPORT  PATIENT: Hannah Huerta, Hannah Huerta  MR#: [id number] BIRTHDATE: 1961-06-25 , 53  yrs. old GENDER: female ENDOSCOPIST: [name], MD, [facility] PROCEDURE DATE:  09/04/2014 PROCEDURE:   Colonoscopy, screening First Screening Colonoscopy - Avg.  risk and is 50 yrs.  old or older Yes.  Prior Negative Screening - Now for repeat screening. N/A  History of Adenoma - Now for follow-up colonoscopy & has been > or = to 3 yrs.  N/A  Polyps Removed Today? No.  Polyps Removed Today? No.  Recommend repeat exam, <10 yrs? Polyps Removed Today? No.  Recommend repeat exam, <10 yrs? No. ASA CLASS:   Class III INDICATIONS:average risk patient for colorectal cancer. MEDICATIONS: Monitored anesthesia care and Per Anesthesia  DESCRIPTION OF PROCEDURE:   After the risks benefits and alternatives of the procedure were thoroughly explained, informed consent was obtained.  The digital rectal exam revealed no abnormalities of the rectum.   The Pentax Ped Colon [id number] endoscope was introduced through the anus and advanced to the cecum, which was identified by both the appendix and ileocecal valve. No adverse events experienced.   The quality of the prep was excellent using Suprep  The instrument was then slowly withdrawn as the colon was fully examined.      COLON FINDINGS: 1) Scattered diverticulosis throughout colon.  2) Internal and external hemorrhoids 3) otherwise normal colonoscopy with excellent prep.  Retroflexed views revealed internal hemorrhoids and Retroflexed views revealed external hemorrhoids. The time to cecum=6 minutes 0 seconds.  Withdrawal time=10 minutes 0 seconds.  The scope was withdrawn and the procedure completed. COMPLICATIONS: There were no immediate complications.  ENDOSCOPIC IMPRESSION: 1) Scattered diverticulosis throughout colon. 2) Internal and external hemorrhoids 3) otherwise normal  colonoscopy with excellent prep  RECOMMENDATIONS: Repeat colonoscopy 10 years.  eSigned:  [name], MD, [facility] 09/04/2014 9:24 AM   cc: The Patient and Dr. Maxine Glenn

## 2014-09-04 NOTE — Anesthesia Postprocedure Evaluation (Signed)
  Anesthesia Post-op Note  Patient: Hannah Huerta  Procedure(s) Performed: Procedure(s): COLONOSCOPY WITH PROPOFOL (N/A) Patient is awake and responsive. Pain and nausea are reasonably well controlled. Vital signs are stable and clinically acceptable. Oxygen saturation is clinically acceptable. There are no apparent anesthetic complications at this time. Patient is ready for discharge.

## 2014-09-04 NOTE — Discharge Instructions (Addendum)
° °  No polyps or cancer seen!  You do have mild diverticulosis and small hemorrhoids which are not usually a problem.  Next routine colonoscopy in 10 years - 2026  I appreciate the opportunity to care for you. [name], MD, FACG  YOU HAD AN ENDOSCOPIC PROCEDURE TODAY: Refer to the procedure report and other information in the discharge instructions given to you for any specific questions about what was found during the examination. If this information does not answer your questions, please call Dr. Celesta Aver office at [phone] to clarify.   YOU SHOULD EXPECT: Some feelings of bloating in the abdomen. Passage of more gas than usual. Walking can help get rid of the air that was put into your GI tract during the procedure and reduce the bloating. If you had a lower endoscopy (such as a colonoscopy or flexible sigmoidoscopy) you may notice spotting of blood in your stool or on the toilet paper. Some abdominal soreness may be present for a day or two, also.  DIET: Your first meal following the procedure should be a light meal and then it is ok to progress to your normal diet. A half-sandwich or bowl of soup is an example of a good first meal. Heavy or fried foods are harder to digest and may make you feel nauseous or bloated. Drink plenty of fluids but you should avoid alcoholic beverages for 24 hours.   ACTIVITY: Your care partner should take you home directly after the procedure. You should plan to take it easy, moving slowly for the rest of the day. You can resume normal activity the day after the procedure however YOU SHOULD NOT DRIVE, use power tools, machinery or perform tasks that involve climbing or major physical exertion for 24 hours (because of the sedation medicines used during the test).   SYMPTOMS TO REPORT IMMEDIATELY: A gastroenterologist can be reached at any hour. Please call [phone]  for any of the following symptoms:  Following lower endoscopy (colonoscopy,  flexible sigmoidoscopy) Excessive amounts of blood in the stool  Significant tenderness, worsening of abdominal pains  Swelling of the abdomen that is new, acute  Fever of 100 or higher   FOLLOW UP:  If any biopsies were taken you will be contacted by phone or by letter within the next 1-3 weeks. Call [phone]  if you have not heard about the biopsies in 3 weeks.  Please also call with any specific questions about appointments or follow up tests.

## 2014-09-04 NOTE — H&P (View-Only) (Signed)
HPI : Patient is 54 year old female, referred for evaluation of colon cancer screening. She has a history of morbid obesity, hypertension , migraine headaches, and diabetes. Patient was hospitalized in October with chest pain. She had a cardiac catheter in 2013 revealing nonobstructive coronary artery disease. Ejection fraction 55% on 2D echo March 2015.   Patient has no GI complaints. Specifically, she has no abdominal pain, nausea vomiting, bowel changes, blood in stool. She is only on 2 3 medications on a regular basis   Past Medical History  Diagnosis Date  . Hypertension   . GERD (gastroesophageal reflux disease)   . Anemia   . Sickle cell trait   . Morbid obesity with BMI of 50.0-59.9, adult   . Chest pain     a. 02/2012 abnl myoview - inferoapical reverisbility concerning for ischemia, EF 59%;   02/2012 nonobstructive cath  . Abnormal Pap smear of cervix     pt couldnt state what type of abnormality  . HLD (hyperlipidemia)   . Chronic migraine   . Prediabetes     Family History  Problem Relation Age of Onset  . Coronary artery disease Father 70  . Heart disease Father   . Other Father     died in a MVA  . Hypertension Father   . Sudden death Mother 69    Questionable MI  . Diabetes Mother   . Hypertension Mother   . Hypertension Sister   . Pancreatic cancer Maternal Aunt   . Stroke Paternal Grandmother   . Breast cancer Maternal Aunt     after age 59  . Colon cancer Neg Hx   . Colon polyps Sister   . Gallbladder disease Neg Hx   . Esophageal cancer Neg Hx    History  Substance Use Topics  . Smoking status: Never Smoker   . Smokeless tobacco: Never Used  . Alcohol Use: No   Current Outpatient Prescriptions  Medication Sig Dispense Refill  . acetaminophen (TYLENOL) 500 MG tablet Take 1,000 mg by mouth every 8 (eight) hours as needed (pain).    [name] aspirin EC 81 MG EC tablet Take 1 tablet (81 mg total) by mouth daily. 30 tablet 0  . indomethacin (INDOCIN) 25  MG capsule Take 1 capsule (25 mg total) by mouth 2 (two) times daily with a meal. As needed for back pain 30 capsule 0  . topiramate (TOPAMAX) 25 MG tablet Take 1 tablet (25 mg total) by mouth at bedtime. 30 tablet 0  . Na Sulfate-K Sulfate-Mg Sulf (SUPREP BOWEL PREP) SOLN Take 1 kit by mouth once. 1 Bottle 0   No current facility-administered medications for this visit.   Facility-Administered Medications Ordered in Other Visits  Medication Dose Route Frequency Provider Last Rate Last Dose  . topiramate (TOPAMAX) tablet 25 mg  25 mg Oral QHS Alexa Richardson, MD       No Known Allergies   Review of Systems: All systems reviewed and negative except where noted in HPI.   Physical Exam: BP 150/80 mmHg  Pulse 68  Ht 5' 2" (1.575 m)  Wt 299 lb 2 oz (135.682 kg)  BMI 54.70 kg/m2  LMP 09/15/2013 Constitutional: Pleasant, obese black female in no acute distress. HEENT: Normocephalic and atraumatic. Conjunctivae are normal. No scleral icterus. Neck supple.  Cardiovascular: Normal rate, regular rhythm.  Pulmonary/chest: Effort normal and breath sounds normal. No wheezing, rales or rhonchi. Abdominal: Soft, obese, nontender. Bowel sounds active throughout. There are no masses palpable. No  hepatomegaly. Extremities: 1-2+ BLE edema Lymphadenopathy: No cervical adenopathy noted. Neurological: Alert and oriented to person place and time. Skin: Skin is warm and dry. No rashes noted. Psychiatric: Normal mood and affect. Behavior is normal.   ASSESSMENT AND PLAN:  1. Pleasant, morbidly obese 54 year old female for colon cancer screening. Given BMI greater than 50 procedure will need to be done at [facility]. The risks, benefits, and alternatives to colonoscopy with possible biopsy and possible polypectomy were discussed with the patient and she consents to proceed.   2. Chronic microcytic anemia, stable. This dates back at least 7 years.

## 2014-09-04 NOTE — Transfer of Care (Signed)
Immediate Anesthesia Transfer of Care Note  Patient: Hannah Huerta  Procedure(s) Performed: Procedure(s) (LRB): COLONOSCOPY WITH PROPOFOL (N/A)  Patient Location: PACU  Anesthesia Type: MAC  Level of Consciousness: sedated, patient cooperative and responds to stimulation  Airway & Oxygen Therapy: Patient Spontanous Breathing and Patient connected to face mask oxgen  Post-op Assessment: Report given to PACU RN and Post -op Vital signs reviewed and stable  Post vital signs: Reviewed and stable  Complications: No apparent anesthesia complications

## 2014-09-04 NOTE — Interval H&P Note (Signed)
History and Physical Interval Note:  09/04/2014 7:41 AM  Hannah Huerta  has presented today for surgery, with the diagnosis of screening colonoscopy BMI Greater than 50  The various methods of treatment have been discussed with the patient and family. After consideration of risks, benefits and other options for treatment, the patient has consented to  Procedure(s): COLONOSCOPY WITH PROPOFOL (N/A) as a surgical intervention .  The patient's history has been reviewed, patient examined, no change in status, stable for surgery.  I have reviewed the patient's chart and labs.  Questions were answered to the patient's satisfaction.     Silvano Rusk

## 2014-09-07 ENCOUNTER — Encounter (HOSPITAL_COMMUNITY): Payer: Self-pay | Admitting: Internal Medicine

## 2015-01-15 ENCOUNTER — Emergency Department (HOSPITAL_COMMUNITY): Payer: BLUE CROSS/BLUE SHIELD

## 2015-01-15 ENCOUNTER — Emergency Department (HOSPITAL_COMMUNITY)
Admission: EM | Admit: 2015-01-15 | Discharge: 2015-01-15 | Disposition: A | Discharge status: Home / Self Care | Payer: BLUE CROSS/BLUE SHIELD | Attending: Emergency Medicine | Admitting: Emergency Medicine

## 2015-01-15 ENCOUNTER — Encounter (HOSPITAL_COMMUNITY): Payer: Self-pay | Admitting: Emergency Medicine

## 2015-01-15 DIAGNOSIS — I10 Essential (primary) hypertension: Secondary | ICD-10-CM | POA: Insufficient documentation

## 2015-01-15 DIAGNOSIS — Z7982 Long term (current) use of aspirin: Secondary | ICD-10-CM | POA: Diagnosis not present

## 2015-01-15 DIAGNOSIS — Z791 Long term (current) use of non-steroidal anti-inflammatories (NSAID): Secondary | ICD-10-CM | POA: Insufficient documentation

## 2015-01-15 DIAGNOSIS — Z862 Personal history of diseases of the blood and blood-forming organs and certain disorders involving the immune mechanism: Secondary | ICD-10-CM | POA: Insufficient documentation

## 2015-01-15 DIAGNOSIS — M25571 Pain in right ankle and joints of right foot: Secondary | ICD-10-CM | POA: Diagnosis not present

## 2015-01-15 DIAGNOSIS — Z8781 Personal history of (healed) traumatic fracture: Secondary | ICD-10-CM | POA: Insufficient documentation

## 2015-01-15 DIAGNOSIS — Z8719 Personal history of other diseases of the digestive system: Secondary | ICD-10-CM | POA: Diagnosis not present

## 2015-01-15 DIAGNOSIS — G43709 Chronic migraine without aura, not intractable, without status migrainosus: Secondary | ICD-10-CM | POA: Insufficient documentation

## 2015-01-15 DIAGNOSIS — M79671 Pain in right foot: Secondary | ICD-10-CM

## 2015-01-15 MED ORDER — OXYCODONE-ACETAMINOPHEN 5-325 MG PO TABS
1.0000 | ORAL_TABLET | Freq: Once | ORAL | Status: AC
  Administered 2015-01-15: 1 via ORAL
  Filled 2015-01-15: qty 1

## 2015-01-15 MED ORDER — OXYCODONE-ACETAMINOPHEN 5-325 MG PO TABS
1.0000 | ORAL_TABLET | ORAL | Status: DC | PRN
Start: 2015-01-15 — End: 2015-06-04

## 2015-01-15 MED ORDER — MELOXICAM 7.5 MG PO TABS: 7.5000 mg | ORAL_TABLET | Freq: Every day | ORAL | Status: DC

## 2015-01-15 NOTE — Discharge Instructions (Signed)
Your x-ray did not show the cause for your foot pain. There is a healing fracture on the outside part of your foot, but not where you are hurting. Please make a follow-up appointment with the orthopedic doctor for further evaluation. In the meantime, where the postop shoe and take medications as prescribed.  Meloxicam tablets What is this medicine? MELOXICAM (mel OX i cam) is a non-steroidal anti-inflammatory drug (NSAID). It is used to reduce swelling and to treat pain. It may be used for osteoarthritis, rheumatoid arthritis, or juvenile rheumatoid arthritis. This medicine may be used for other purposes; ask your health care provider or pharmacist if you have questions. COMMON BRAND NAME(S): Mobic What should I tell my health care provider before I take this medicine? They need to know if you have any of these conditions: -asthma -cigarette smoker -coronary artery bypass graft (CABG) surgery within the past 2 weeks -drink more than 3 alcohol-containing drinks a day -heart disease or circulation problems such as heart failure or leg edema (fluid retention) -hemophilia or bleeding problems -high blood pressure -kidney disease -liver disease -stomach bleeding or ulcers -an unusual or allergic reaction to meloxicam, aspirin, other NSAIDs, other medicines, foods, dyes, or preservatives -pregnant or trying to get pregnant -breast-feeding How should I use this medicine? Take this medicine by mouth with a full glass of water. Follow the directions on the prescription label. Take this medicine in an upright or sitting position. If possible take bedtime doses at least 10 minutes before lying down. You can take it with or without food. If it upsets your stomach, take it with food. Take your medicine at regular intervals. Do not take it more often than directed. A special MedGuide will be given to you by the pharmacist with each prescription and refill. Be sure to read this information carefully each  time. Talk to your pediatrician regarding the use of this medicine in children. Special care may be needed. Elderly patients over 51 years old may have a stronger reaction to this medicine and need smaller doses. Overdosage: If you think you have taken too much of this medicine contact a poison control center or emergency room at once. NOTE: This medicine is only for you. Do not share this medicine with others. What if I miss a dose? If you miss a dose, take it as soon as you can. If it is almost time for your next dose, take only that dose. Do not take double or extra doses. What may interact with this medicine? -alcohol -aspirin -cidofovir -diuretics -lithium -medicines for high blood pressure -methotrexate -other drugs for inflammation like ketorolac, ibuprofen, and prednisone -pemetrexed -warfarin This list may not describe all possible interactions. Give your health care provider a list of all the medicines, herbs, non-prescription drugs, or dietary supplements you use. Also tell them if you smoke, drink alcohol, or use illegal drugs. Some items may interact with your medicine. What should I watch for while using this medicine? Tell your doctor or healthcare professional if your pain does not get better. Talk to your doctor before taking another medicine for pain. Do not treat yourself. This medicine does not prevent heart attack or stroke. If you take aspirin to prevent heart attack or stroke, talk with your doctor or health care professional. Do not take medicines such as ibuprofen and naproxen with this medicine. Side effects such as stomach upset, nausea, or ulcers may be more likely to occur. Many medicines available without a prescription should not be taken with  this medicine. What side effects may I notice from receiving this medicine? Side effects that you should report to your doctor or health care professional as soon as possible: -black or bloody stools, blood in the urine  or vomit -blurred vision -chest pain -difficulty breathing or wheezing -nausea or vomiting -skin rash, skin redness, blistering or peeling skin, hives, or itching -slurred speech or weakness on one side of the body -swelling of eyelids, throat, lips -unexplained weight gain or swelling -unusually weak or tired -yellowing of eyes or skin Side effects that usually do not require medical attention (report to your doctor or health care professional if they continue or are bothersome): -constipation or diarrhea -dizziness -gas or heartburn -stomach pain This list may not describe all possible side effects. Call your doctor for medical advice about side effects. You may report side effects to FDA at 1-800-FDA-1088. Where should I keep my medicine? Keep out of the reach of children. Store at room temperature between 15 and 30 degrees C (59 and 86 degrees F). Protect from moisture. Keep container tightly closed. Throw away any unused medicine after the expiration date. NOTE: This sheet is a summary. It may not cover all possible information. If you have questions about this medicine, talk to your doctor, pharmacist, or health care provider.  2015, Elsevier/Gold Standard. (2009-10-25 21:15:42)  Acetaminophen; Oxycodone tablets What is this medicine? ACETAMINOPHEN; OXYCODONE (a set a MEE noe fen; ox i KOE done) is a pain reliever. It is used to treat mild to moderate pain. This medicine may be used for other purposes; ask your health care provider or pharmacist if you have questions. COMMON BRAND NAME(S): Endocet, Magnacet, Narvox, Percocet, Perloxx, Primalev, Primlev, Roxicet, Xolox What should I tell my health care provider before I take this medicine? They need to know if you have any of these conditions: -brain tumor -Crohn's disease, inflammatory bowel disease, or ulcerative colitis -drug abuse or addiction -head injury -heart or circulation problems -if you often drink  alcohol -kidney disease or problems going to the bathroom -liver disease -lung disease, asthma, or breathing problems -an unusual or allergic reaction to acetaminophen, oxycodone, other opioid analgesics, other medicines, foods, dyes, or preservatives -pregnant or trying to get pregnant -breast-feeding How should I use this medicine? Take this medicine by mouth with a full glass of water. Follow the directions on the prescription label. Take your medicine at regular intervals. Do not take your medicine more often than directed. Talk to your pediatrician regarding the use of this medicine in children. Special care may be needed. Patients over 106 years old may have a stronger reaction and need a smaller dose. Overdosage: If you think you have taken too much of this medicine contact a poison control center or emergency room at once. NOTE: This medicine is only for you. Do not share this medicine with others. What if I miss a dose? If you miss a dose, take it as soon as you can. If it is almost time for your next dose, take only that dose. Do not take double or extra doses. What may interact with this medicine? -alcohol -antihistamines -barbiturates like amobarbital, butalbital, butabarbital, methohexital, pentobarbital, phenobarbital, thiopental, and secobarbital -benztropine -drugs for bladder problems like solifenacin, trospium, oxybutynin, tolterodine, hyoscyamine, and methscopolamine -drugs for breathing problems like ipratropium and tiotropium -drugs for certain stomach or intestine problems like propantheline, homatropine methylbromide, glycopyrrolate, atropine, belladonna, and dicyclomine -general anesthetics like etomidate, ketamine, nitrous oxide, propofol, desflurane, enflurane, halothane, isoflurane, and sevoflurane -medicines for  depression, anxiety, or psychotic disturbances -medicines for sleep -muscle relaxants -naltrexone -narcotic medicines (opiates) for  pain -phenothiazines like perphenazine, thioridazine, chlorpromazine, mesoridazine, fluphenazine, prochlorperazine, promazine, and trifluoperazine -scopolamine -tramadol -trihexyphenidyl This list may not describe all possible interactions. Give your health care provider a list of all the medicines, herbs, non-prescription drugs, or dietary supplements you use. Also tell them if you smoke, drink alcohol, or use illegal drugs. Some items may interact with your medicine. What should I watch for while using this medicine? Tell your doctor or health care professional if your pain does not go away, if it gets worse, or if you have new or a different type of pain. You may develop tolerance to the medicine. Tolerance means that you will need a higher dose of the medication for pain relief. Tolerance is normal and is expected if you take this medicine for a long time. Do not suddenly stop taking your medicine because you may develop a severe reaction. Your body becomes used to the medicine. This does NOT mean you are addicted. Addiction is a behavior related to getting and using a drug for a non-medical reason. If you have pain, you have a medical reason to take pain medicine. Your doctor will tell you how much medicine to take. If your doctor wants you to stop the medicine, the dose will be slowly lowered over time to avoid any side effects. You may get drowsy or dizzy. Do not drive, use machinery, or do anything that needs mental alertness until you know how this medicine affects you. Do not stand or sit up quickly, especially if you are an older patient. This reduces the risk of dizzy or fainting spells. Alcohol may interfere with the effect of this medicine. Avoid alcoholic drinks. There are different types of narcotic medicines (opiates) for pain. If you take more than one type at the same time, you may have more side effects. Give your health care provider a list of all medicines you use. Your doctor will  tell you how much medicine to take. Do not take more medicine than directed. Call emergency for help if you have problems breathing. The medicine will cause constipation. Try to have a bowel movement at least every 2 to 3 days. If you do not have a bowel movement for 3 days, call your doctor or health care professional. Do not take Tylenol (acetaminophen) or medicines that have acetaminophen with this medicine. Too much acetaminophen can be very dangerous. Many nonprescription medicines contain acetaminophen. Always read the labels carefully to avoid taking more acetaminophen. What side effects may I notice from receiving this medicine? Side effects that you should report to your doctor or health care professional as soon as possible: -allergic reactions like skin rash, itching or hives, swelling of the face, lips, or tongue -breathing difficulties, wheezing -confusion -light headedness or fainting spells -severe stomach pain -unusually weak or tired -yellowing of the skin or the whites of the eyes Side effects that usually do not require medical attention (report to your doctor or health care professional if they continue or are bothersome): -dizziness -drowsiness -nausea -vomiting This list may not describe all possible side effects. Call your doctor for medical advice about side effects. You may report side effects to FDA at 1-800-FDA-1088. Where should I keep my medicine? Keep out of the reach of children. This medicine can be abused. Keep your medicine in a safe place to protect it from theft. Do not share this medicine with anyone. Selling or giving  away this medicine is dangerous and against the law. Store at room temperature between 20 and 25 degrees C (68 and 77 degrees F). Keep container tightly closed. Protect from light. This medicine may cause accidental overdose and death if it is taken by other adults, children, or pets. Flush any unused medicine down the toilet to reduce the  chance of harm. Do not use the medicine after the expiration date. NOTE: This sheet is a summary. It may not cover all possible information. If you have questions about this medicine, talk to your doctor, pharmacist, or health care provider.  2015, Elsevier/Gold Standard. (2013-02-24 13:17:35)

## 2015-01-15 NOTE — ED Notes (Addendum)
Pt. reports progressing chronic pain at right foot injured last month with fracture .

## 2015-01-15 NOTE — ED Provider Notes (Signed)
CSN: [id number]     Arrival date & time 01/15/15  3244 History   First MD Initiated Contact with Patient 01/15/15 0533     Chief Complaint  Patient presents with  . Foot Pain     (Consider location/radiation/quality/duration/timing/severity/associated sxs/prior Treatment) Patient is a 54 y.o. female presenting with lower extremity pain. The history is provided by the patient.  Foot Pain  She complains of pain in the lateral aspect of her right foot for about the last month but worse tonight. Pain is worse with standing and better if she lays down for a period of time. She states it takes about an hour for her foot to start feeling better. Pain is 10/10 at its worst, currently 5/10. Acetaminophen does give temporary relief. She has been under the care of a podiatrist who had told her that she had a fracture and treated her with some cortisone shots. She had been advised to stay off of that but had to go back to work and states that that is when pain started to get worse. She denies any recent trauma.  Past Medical History  Diagnosis Date  . Hypertension   . GERD (gastroesophageal reflux disease)   . Anemia   . Sickle cell trait   . Morbid obesity with BMI of 50.0-59.9, adult   . Chest pain     a. 02/2012 abnl myoview - inferoapical reverisbility concerning for ischemia, EF 59%;   02/2012 nonobstructive cath  . Abnormal Pap smear of cervix     pt couldnt state what type of abnormality  . HLD (hyperlipidemia)   . Chronic migraine   . Prediabetes    Past Surgical History  Procedure Laterality Date  . Tubal ligation    . Spine surgery      fusion  . Left heart catheterization with coronary angiogram N/A 03/08/2012    Procedure: LEFT HEART CATHETERIZATION WITH CORONARY ANGIOGRAM;  Surgeon: Hillary Bow, MD;  Location: [facility] CATH LAB;  Service: Cardiovascular;  Laterality: N/A;  . Back surgery      x1 lumbar fusion  . Cholecystectomy      laparoscopic  . Colonoscopy with propofol N/A  09/04/2014    Procedure: COLONOSCOPY WITH PROPOFOL;  Surgeon: [name], MD;  Location: [facility] ENDOSCOPY;  Service: Endoscopy;  Laterality: N/A;   Family History  Problem Relation Age of Onset  . Coronary artery disease Father 67  . Heart disease Father   . Other Father     died in a MVA  . Hypertension Father   . Sudden death Mother 31    Questionable MI  . Diabetes Mother   . Hypertension Mother   . Hypertension Sister   . Pancreatic cancer Maternal Aunt   . Stroke Paternal Grandmother   . Breast cancer Maternal Aunt     after age 78  . Colon cancer Neg Hx   . Colon polyps Sister   . Gallbladder disease Neg Hx   . Esophageal cancer Neg Hx    History  Substance Use Topics  . Smoking status: Never Smoker   . Smokeless tobacco: Never Used  . Alcohol Use: No   OB History    Gravida Para Term Preterm AB TAB SAB Ectopic Multiple Living   5 3   2 2    3      Review of Systems  All other systems reviewed and are negative.     Allergies  Review of patient's allergies indicates no known allergies.  Home Medications   Prior to Admission medications   Medication Sig Start Date End Date Taking? Authorizing Provider  acetaminophen (TYLENOL) 500 MG tablet Take 1,000 mg by mouth every 8 (eight) hours as needed (pain).    Historical Provider, MD  aspirin EC 81 MG EC tablet Take 1 tablet (81 mg total) by mouth daily. Patient not taking: Reported on 08/18/2014 04/28/14   Alexa Sherral Hammers, MD  indomethacin (INDOCIN) 25 MG capsule Take 1 capsule (25 mg total) by mouth 2 (two) times daily with a meal. As needed for back pain 06/01/14   Lutricia Feil, PA  topiramate (TOPAMAX) 25 MG tablet Take 1 tablet (25 mg total) by mouth at bedtime. 04/28/14   Alexa Sherral Hammers, MD   BP 144/78 mmHg  Pulse 63  Temp(Src) 97.9 F (36.6 C) (Oral)  Resp 23  Ht 5\' 2"  (1.575 m)  Wt 300 lb (136.079 kg)  BMI 54.86 kg/m2  SpO2 96%  LMP 09/15/2013 Physical Exam  Nursing note and vitals  reviewed.  54 year old female, resting comfortably and in no acute distress. Vital signs are significant for hypertension and tachypnea. Oxygen saturation is 96%, which is normal. Head is normocephalic and atraumatic. PERRLA, EOMI. Oropharynx is clear. Neck is nontender and supple without adenopathy or JVD. Back is nontender and there is no CVA tenderness. Lungs are clear without rales, wheezes, or rhonchi. Chest is nontender. Heart has regular rate and rhythm without murmur. Abdomen is soft, flat, nontender without masses or hepatosplenomegaly and peristalsis is normoactive. Extremities have 2+ edema, full range of motion is present. There is well localized tenderness over the distal aspect of the right fifth metatarsal but tenderness is only present when palpated on the plantar surface or laterally, not if palpated from the dorsal side. Skin is warm and dry without rash. Neurologic: Mental status is normal, cranial nerves are intact, there are no motor or sensory deficits.  ED Course  Procedures (including critical care time)  Imaging Review Dg Foot Complete Right  01/15/2015   CLINICAL DATA:  Right foot pain for 1 week.  EXAM: RIGHT FOOT COMPLETE - 3+ VIEW  COMPARISON:  None.  FINDINGS: There is mild irregularity at the base of the fifth metatarsal which probably represents a small subacute fracture. There is no acute fracture. There is no radiopaque foreign body. Articulations are intact.  IMPRESSION: Subacute fracture fragment off the base of the fifth metatarsal.   Electronically Signed   By: Andreas Newport M.D.   On: 01/15/2015 06:15   Images viewed by me.   MDM   Final diagnoses:  Pain in right foot    Left foot pain. She is being sent for x-rays. Her record on the [place] controlled substance reporting system shows no narcotic prescriptions in the last 6 months.  X-rays show probable small avulsion fracture at the base of the fifth metatarsal. His is remote from where  her pain is. Cause of pain is not clear and this is explained to the patient. She is placed in a postop shoe for comfort and discharged with prescriptions for meloxicam and oxycodone-acetaminophen. She is referred to orthopedics for further evaluation.  Delora Fuel, MD [id number]/[phone]

## 2015-06-04 ENCOUNTER — Observation Stay (HOSPITAL_COMMUNITY)
Admission: EM | Admit: 2015-06-04 | Discharge: 2015-06-05 | Disposition: A | Discharge status: Home / Self Care | Payer: BLUE CROSS/BLUE SHIELD | Attending: Cardiology | Admitting: Cardiology

## 2015-06-04 ENCOUNTER — Emergency Department (HOSPITAL_COMMUNITY): Payer: BLUE CROSS/BLUE SHIELD

## 2015-06-04 ENCOUNTER — Encounter (HOSPITAL_COMMUNITY)
Admission: EM | Disposition: A | Discharge status: Home / Self Care | Payer: Self-pay | Source: Home / Self Care | Attending: Emergency Medicine

## 2015-06-04 ENCOUNTER — Encounter (HOSPITAL_COMMUNITY): Payer: Self-pay | Admitting: Emergency Medicine

## 2015-06-04 DIAGNOSIS — R079 Chest pain, unspecified: Secondary | ICD-10-CM | POA: Diagnosis present

## 2015-06-04 DIAGNOSIS — R0789 Other chest pain: Secondary | ICD-10-CM | POA: Diagnosis not present

## 2015-06-04 DIAGNOSIS — Z79891 Long term (current) use of opiate analgesic: Secondary | ICD-10-CM | POA: Insufficient documentation

## 2015-06-04 DIAGNOSIS — I200 Unstable angina: Secondary | ICD-10-CM

## 2015-06-04 DIAGNOSIS — Z6841 Body Mass Index (BMI) 40.0 and over, adult: Secondary | ICD-10-CM | POA: Diagnosis not present

## 2015-06-04 DIAGNOSIS — E8881 Metabolic syndrome: Secondary | ICD-10-CM | POA: Diagnosis not present

## 2015-06-04 DIAGNOSIS — R42 Dizziness and giddiness: Secondary | ICD-10-CM

## 2015-06-04 DIAGNOSIS — Z7982 Long term (current) use of aspirin: Secondary | ICD-10-CM | POA: Diagnosis not present

## 2015-06-04 DIAGNOSIS — E785 Hyperlipidemia, unspecified: Secondary | ICD-10-CM | POA: Insufficient documentation

## 2015-06-04 DIAGNOSIS — R7303 Prediabetes: Secondary | ICD-10-CM | POA: Insufficient documentation

## 2015-06-04 DIAGNOSIS — D573 Sickle-cell trait: Secondary | ICD-10-CM | POA: Insufficient documentation

## 2015-06-04 DIAGNOSIS — Z79899 Other long term (current) drug therapy: Secondary | ICD-10-CM | POA: Diagnosis not present

## 2015-06-04 DIAGNOSIS — R0602 Shortness of breath: Secondary | ICD-10-CM | POA: Insufficient documentation

## 2015-06-04 DIAGNOSIS — E876 Hypokalemia: Secondary | ICD-10-CM | POA: Insufficient documentation

## 2015-06-04 DIAGNOSIS — R509 Fever, unspecified: Secondary | ICD-10-CM | POA: Insufficient documentation

## 2015-06-04 DIAGNOSIS — I2584 Coronary atherosclerosis due to calcified coronary lesion: Secondary | ICD-10-CM | POA: Insufficient documentation

## 2015-06-04 DIAGNOSIS — Z791 Long term (current) use of non-steroidal anti-inflammatories (NSAID): Secondary | ICD-10-CM | POA: Insufficient documentation

## 2015-06-04 DIAGNOSIS — E669 Obesity, unspecified: Secondary | ICD-10-CM

## 2015-06-04 DIAGNOSIS — K219 Gastro-esophageal reflux disease without esophagitis: Secondary | ICD-10-CM | POA: Diagnosis not present

## 2015-06-04 DIAGNOSIS — I2510 Atherosclerotic heart disease of native coronary artery without angina pectoris: Secondary | ICD-10-CM | POA: Diagnosis not present

## 2015-06-04 DIAGNOSIS — I209 Angina pectoris, unspecified: Secondary | ICD-10-CM

## 2015-06-04 DIAGNOSIS — I10 Essential (primary) hypertension: Secondary | ICD-10-CM

## 2015-06-04 DIAGNOSIS — Z9889 Other specified postprocedural states: Secondary | ICD-10-CM

## 2015-06-04 HISTORY — DX: Metabolic syndrome: E88.81

## 2015-06-04 HISTORY — PX: CARDIAC CATHETERIZATION: SHX172

## 2015-06-04 HISTORY — DX: Hypokalemia: E87.6

## 2015-06-04 HISTORY — DX: Other specified postprocedural states: Z98.890

## 2015-06-04 HISTORY — DX: Atherosclerotic heart disease of native coronary artery without angina pectoris: I25.10

## 2015-06-04 LAB — BASIC METABOLIC PANEL
Anion gap: 6 (ref 5–15)
BUN: 10 mg/dL (ref 6–20)
CO2: 29 mmol/L (ref 22–32)
Calcium: 8.6 mg/dL — ABNORMAL LOW (ref 8.9–10.3)
Chloride: 104 mmol/L (ref 101–111)
Creatinine, Ser: 1.12 mg/dL — ABNORMAL HIGH (ref 0.44–1.00)
GFR calc Af Amer: >60 mL/min (ref >60)
GFR calc non Af Amer: 55 mL/min — ABNORMAL LOW (ref >60)
Glucose, Bld: 101 mg/dL — ABNORMAL HIGH (ref 65–99)
Potassium: 3.1 mmol/L — ABNORMAL LOW (ref 3.5–5.1)
Sodium: 139 mmol/L (ref 135–145)

## 2015-06-04 LAB — GLUCOSE, CAPILLARY: Glucose-Capillary: 122 mg/dL — ABNORMAL HIGH (ref 65–99)

## 2015-06-04 LAB — HEPATIC FUNCTION PANEL
ALT: 35 U/L (ref 14–54)
AST: 56 U/L — ABNORMAL HIGH (ref 15–41)
Albumin: 3 g/dL — ABNORMAL LOW (ref 3.5–5.0)
Alkaline Phosphatase: 56 U/L (ref 38–126)
Bilirubin, Direct: <0.1 mg/dL — ABNORMAL LOW (ref 0.1–0.5)
Total Bilirubin: 0.5 mg/dL (ref 0.3–1.2)
Total Protein: 7.1 g/dL (ref 6.5–8.1)

## 2015-06-04 LAB — CBC
HCT: 33.1 % — ABNORMAL LOW (ref 36.0–46.0)
HCT: 38.1 % (ref 36.0–46.0)
Hemoglobin: 10.8 g/dL — ABNORMAL LOW (ref 12.0–15.0)
Hemoglobin: 12.2 g/dL (ref 12.0–15.0)
MCH: 24.3 pg — ABNORMAL LOW (ref 26.0–34.0)
MCH: 24.1 pg — ABNORMAL LOW (ref 26.0–34.0)
MCHC: 32.6 g/dL (ref 30.0–36.0)
MCHC: 32 g/dL (ref 30.0–36.0)
MCV: 74.5 fL — ABNORMAL LOW (ref 78.0–100.0)
MCV: 75.1 fL — ABNORMAL LOW (ref 78.0–100.0)
Platelets: 233 10*3/uL (ref 150–400)
Platelets: 272 10*3/uL (ref 150–400)
RBC: 4.44 MIL/uL (ref 3.87–5.11)
RBC: 5.07 MIL/uL (ref 3.87–5.11)
RDW: 15.6 % — ABNORMAL HIGH (ref 11.5–15.5)
RDW: 15.7 % — ABNORMAL HIGH (ref 11.5–15.5)
WBC: 4.3 10*3/uL (ref 4.0–10.5)
WBC: 5.2 10*3/uL (ref 4.0–10.5)

## 2015-06-04 LAB — I-STAT TROPONIN, ED: Troponin i, poc: 0.01 ng/mL (ref 0.00–0.08)

## 2015-06-04 LAB — CREATININE, SERUM
Creatinine, Ser: 0.99 mg/dL (ref 0.44–1.00)
GFR calc Af Amer: >60 mL/min (ref >60)
GFR calc non Af Amer: >60 mL/min (ref >60)

## 2015-06-04 LAB — TROPONIN I: Troponin I: <0.03 ng/mL (ref <0.031)

## 2015-06-04 SURGERY — LEFT HEART CATH AND CORONARY ANGIOGRAPHY

## 2015-06-04 MED ORDER — SODIUM CHLORIDE 0.9 % IJ SOLN: 3.0000 mL | Freq: Two times a day (BID) | INTRAMUSCULAR | Status: DC

## 2015-06-04 MED ORDER — HEPARIN (PORCINE) IN NACL 2-0.9 UNIT/ML-% IJ SOLN
INTRAMUSCULAR | Status: DC | PRN
  Administered 2015-06-04: 16:00:00

## 2015-06-04 MED ORDER — ACETAMINOPHEN 325 MG PO TABS
650.0000 mg | ORAL_TABLET | ORAL | Status: DC | PRN
  Administered 2015-06-04: 19:00:00 650 mg via ORAL
  Filled 2015-06-04: qty 2

## 2015-06-04 MED ORDER — NITROGLYCERIN 0.4 MG SL SUBL
0.4000 mg | SUBLINGUAL_TABLET | SUBLINGUAL | Status: DC | PRN
Start: 2015-06-04 — End: 2015-06-05
  Administered 2015-06-04 (×2): 0.4 mg via SUBLINGUAL
  Filled 2015-06-04 (×2): qty 1

## 2015-06-04 MED ORDER — OXYCODONE-ACETAMINOPHEN 5-325 MG PO TABS: 1.0000 | ORAL_TABLET | ORAL | Status: DC | PRN

## 2015-06-04 MED ORDER — ASPIRIN 300 MG RE SUPP: 300.0000 mg | RECTAL | Status: DC

## 2015-06-04 MED ORDER — VERAPAMIL HCL 2.5 MG/ML IV SOLN
INTRAVENOUS | Status: AC
  Filled 2015-06-04: qty 2

## 2015-06-04 MED ORDER — HYDRALAZINE HCL 20 MG/ML IJ SOLN
INTRAMUSCULAR | Status: AC
  Filled 2015-06-04: qty 1

## 2015-06-04 MED ORDER — HEPARIN SODIUM (PORCINE) 1000 UNIT/ML IJ SOLN
INTRAMUSCULAR | Status: DC | PRN
  Administered 2015-06-04: 6000 [IU] via INTRAVENOUS

## 2015-06-04 MED ORDER — FENTANYL CITRATE (PF) 100 MCG/2ML IJ SOLN
INTRAMUSCULAR | Status: AC
  Filled 2015-06-04: qty 2

## 2015-06-04 MED ORDER — MIDAZOLAM HCL 2 MG/2ML IJ SOLN
INTRAMUSCULAR | Status: DC | PRN
  Administered 2015-06-04: 1 mg via INTRAVENOUS

## 2015-06-04 MED ORDER — CARVEDILOL 3.125 MG PO TABS
3.1250 mg | ORAL_TABLET | Freq: Two times a day (BID) | ORAL | Status: DC
  Administered 2015-06-04 – 2015-06-05 (×2): 3.125 mg via ORAL
  Filled 2015-06-04 (×2): qty 1

## 2015-06-04 MED ORDER — ASPIRIN 81 MG PO CHEW: 324.0000 mg | CHEWABLE_TABLET | ORAL | Status: DC

## 2015-06-04 MED ORDER — LIDOCAINE HCL (PF) 1 % IJ SOLN
INTRAMUSCULAR | Status: AC
  Filled 2015-06-04: qty 30

## 2015-06-04 MED ORDER — ACETAMINOPHEN 500 MG PO TABS: 1000.0000 mg | ORAL_TABLET | Freq: Three times a day (TID) | ORAL | Status: DC | PRN

## 2015-06-04 MED ORDER — HYDRALAZINE HCL 20 MG/ML IJ SOLN
INTRAMUSCULAR | Status: DC | PRN
  Administered 2015-06-04: 10 mg via INTRAVENOUS

## 2015-06-04 MED ORDER — HEPARIN (PORCINE) IN NACL 2-0.9 UNIT/ML-% IJ SOLN
INTRAMUSCULAR | Status: AC
  Filled 2015-06-04: qty 1500

## 2015-06-04 MED ORDER — SODIUM CHLORIDE 0.9 % IV SOLN: 250.0000 mL | INTRAVENOUS | Status: DC | PRN

## 2015-06-04 MED ORDER — VERAPAMIL HCL 2.5 MG/ML IV SOLN
INTRAVENOUS | Status: DC | PRN
  Administered 2015-06-04: 10 mL via INTRA_ARTERIAL

## 2015-06-04 MED ORDER — LIDOCAINE HCL (PF) 1 % IJ SOLN
INTRAMUSCULAR | Status: DC | PRN
  Administered 2015-06-04: 5 mL

## 2015-06-04 MED ORDER — IOHEXOL 350 MG/ML SOLN
INTRAVENOUS | Status: DC | PRN
  Administered 2015-06-04: 90 mL via INTRA_ARTERIAL

## 2015-06-04 MED ORDER — ONDANSETRON HCL 4 MG/2ML IJ SOLN: 4.0000 mg | Freq: Four times a day (QID) | INTRAMUSCULAR | Status: DC | PRN

## 2015-06-04 MED ORDER — ASPIRIN 81 MG PO CHEW
324.0000 mg | CHEWABLE_TABLET | Freq: Once | ORAL | Status: AC
  Administered 2015-06-04: 324 mg via ORAL
  Filled 2015-06-04: qty 4

## 2015-06-04 MED ORDER — ENOXAPARIN SODIUM 40 MG/0.4ML ~~LOC~~ SOLN
40.0000 mg | SUBCUTANEOUS | Status: DC
  Administered 2015-06-05: 09:00:00 40 mg via SUBCUTANEOUS
  Filled 2015-06-04 (×2): qty 0.4

## 2015-06-04 MED ORDER — SODIUM CHLORIDE 0.9 % IV SOLN
INTRAVENOUS | Status: DC | PRN
  Administered 2015-06-04: 1 mL/kg/h via INTRAVENOUS

## 2015-06-04 MED ORDER — MIDAZOLAM HCL 2 MG/2ML IJ SOLN
INTRAMUSCULAR | Status: AC
  Filled 2015-06-04: qty 2

## 2015-06-04 MED ORDER — SODIUM CHLORIDE 0.9 % IJ SOLN: 3.0000 mL | INTRAMUSCULAR | Status: DC | PRN

## 2015-06-04 MED ORDER — POTASSIUM CHLORIDE CRYS ER 20 MEQ PO TBCR
40.0000 meq | EXTENDED_RELEASE_TABLET | Freq: Two times a day (BID) | ORAL | Status: AC
  Administered 2015-06-04 – 2015-06-05 (×2): 40 meq via ORAL
  Filled 2015-06-04 (×2): qty 2

## 2015-06-04 MED ORDER — SODIUM CHLORIDE 0.9 % WEIGHT BASED INFUSION
1.0000 mL/kg/h | INTRAVENOUS | Status: DC
  Administered 2015-06-04: 1 mL/kg/h via INTRAVENOUS

## 2015-06-04 MED ORDER — ASPIRIN EC 81 MG PO TBEC
81.0000 mg | DELAYED_RELEASE_TABLET | Freq: Every day | ORAL | Status: DC
  Administered 2015-06-05: 09:00:00 81 mg via ORAL
  Filled 2015-06-04: qty 1

## 2015-06-04 MED ORDER — HEPARIN SODIUM (PORCINE) 1000 UNIT/ML IJ SOLN
INTRAMUSCULAR | Status: AC
  Filled 2015-06-04: qty 1

## 2015-06-04 MED ORDER — MELOXICAM 7.5 MG PO TABS
7.5000 mg | ORAL_TABLET | Freq: Every day | ORAL | Status: DC
  Administered 2015-06-04 – 2015-06-05 (×2): 7.5 mg via ORAL
  Filled 2015-06-04 (×2): qty 1

## 2015-06-04 MED ORDER — FENTANYL CITRATE (PF) 100 MCG/2ML IJ SOLN
INTRAMUSCULAR | Status: DC | PRN
  Administered 2015-06-04: 50 ug via INTRAVENOUS

## 2015-06-04 MED ORDER — ATORVASTATIN CALCIUM 80 MG PO TABS
80.0000 mg | ORAL_TABLET | Freq: Every day | ORAL | Status: DC
  Administered 2015-06-04: 80 mg via ORAL
  Filled 2015-06-04: qty 1

## 2015-06-04 MED ORDER — NITROGLYCERIN 0.4 MG SL SUBL: 0.4000 mg | SUBLINGUAL_TABLET | SUBLINGUAL | Status: DC | PRN

## 2015-06-04 SURGICAL SUPPLY — 10 items
CATH INFINITI 5 FR JL3.5 (CATHETERS) ×3 IMPLANT
CATH INFINITI JR4 5F (CATHETERS) ×3 IMPLANT
DEVICE RAD COMP TR BAND LRG (VASCULAR PRODUCTS) ×3 IMPLANT
GLIDESHEATH SLEND A-KIT 6F 22G (SHEATH) ×3 IMPLANT
HOVERMATT SINGLE USE (MISCELLANEOUS) ×3 IMPLANT
KIT HEART LEFT (KITS) ×3 IMPLANT
PACK CARDIAC CATHETERIZATION (CUSTOM PROCEDURE TRAY) ×3 IMPLANT
TRANSDUCER W/STOPCOCK (MISCELLANEOUS) ×3 IMPLANT
TUBING CIL FLEX 10 FLL-RA (TUBING) ×3 IMPLANT
WIRE SAFE-T 1.5MM-J .035X260CM (WIRE) ×3 IMPLANT

## 2015-06-04 NOTE — Interval H&P Note (Signed)
Cath Lab Visit (complete for each Cath Lab visit)  Clinical Evaluation Leading to the Procedure:   ACS: Yes.    Non-ACS:    Anginal Classification: CCS III  Anti-ischemic medical therapy: Maximal Therapy (2 or more classes of medications)  Non-Invasive Test Results: No non-invasive testing performed  Prior CABG: No previous CABG      History and Physical Interval Note:  06/04/2015 4:07 PM  Hannah Huerta  has presented today for surgery, with the diagnosis of cp  The various methods of treatment have been discussed with the patient and family. After consideration of risks, benefits and other options for treatment, the patient has consented to  Procedure(s): Left Heart Cath and Coronary Angiography (N/A) as a surgical intervention .  The patient's history has been reviewed, patient examined, no change in status, stable for surgery.  I have reviewed the patient's chart and labs.  Questions were answered to the patient's satisfaction.     Sinclair Grooms

## 2015-06-04 NOTE — Progress Notes (Signed)
Pt's oral temperature 100.6, Dr. Oval Linsey informed, new orders received.  Will continue to monitor patient.

## 2015-06-04 NOTE — ED Notes (Signed)
Patient states chest pain x 2 days accompanied by diaphoresis and lightheadedness.   Patient states also had some nausea.   Patient denies other symptoms.   Patient states has been taking ibuprofen for pain at home, but hasn't helped.

## 2015-06-04 NOTE — ED Provider Notes (Signed)
CSN: [id number]     Arrival date & time 06/04/15  0908 History   First MD Initiated Contact with Patient 06/04/15 [phone]     Chief Complaint  Patient presents with  . Chest Pain     (Consider location/radiation/quality/duration/timing/severity/associated sxs/prior Treatment) HPI Comments: Chest pain Left side under left breast to shoulder Tightness, no radiation Sig diaphoresis Shortness of breath, nausea Not worse with exertion No hx of CP like this Comes and goes 103/10 Dad had heart disease at 64       Patient is a 54 y.o. female presenting with chest pain.  Chest Pain Associated symptoms: abdominal pain (rlq mild pain, now resolved), cough (milf), diaphoresis, fatigue and nausea   Associated symptoms: no back pain, no fever (few days ago), no headache, no shortness of breath and not vomiting     Past Medical History  Diagnosis Date  . Hypertension   . GERD (gastroesophageal reflux disease)   . Anemia   . Sickle cell trait ([place])   . Morbid obesity with BMI of 50.0-59.9, adult ([place])   . Chest pain     a. 02/2012 abnl myoview - inferoapical reverisbility concerning for ischemia, EF 59%;   02/2012 nonobstructive cath  . Abnormal Pap smear of cervix     pt couldnt state what type of abnormality  . HLD (hyperlipidemia)   . Chronic migraine   . Prediabetes    Past Surgical History  Procedure Laterality Date  . Tubal ligation    . Spine surgery      fusion  . Left heart catheterization with coronary angiogram N/A 03/08/2012    Procedure: LEFT HEART CATHETERIZATION WITH CORONARY ANGIOGRAM;  Surgeon: Hillary Bow, MD;  Location: [facility] CATH LAB;  Service: Cardiovascular;  Laterality: N/A;  . Back surgery      x1 lumbar fusion  . Cholecystectomy      laparoscopic  . Colonoscopy with propofol N/A 09/04/2014    Procedure: COLONOSCOPY WITH PROPOFOL;  Surgeon: [name], MD;  Location: [facility]OS[facility];  Service: Endoscopy;  Laterality: N/A;   Family History  Problem  Relation Age of Onset  . Coronary artery disease Father 19  . Heart disease Father   . Other Father     died in a MVA  . Hypertension Father   . Sudden death Mother 82    Questionable MI  . Diabetes Mother   . Hypertension Mother   . Hypertension Sister   . Pancreatic cancer Maternal Aunt   . Stroke Paternal Grandmother   . Breast cancer Maternal Aunt     after age 82  . Colon cancer Neg Hx   . Colon polyps Sister   . Gallbladder disease Neg Hx   . Esophageal cancer Neg Hx    Social History  Substance Use Topics  . Smoking status: Never Smoker   . Smokeless tobacco: Never Used  . Alcohol Use: No   OB History    Gravida Para Term Preterm AB TAB SAB Ectopic Multiple Living   5 3   2 2    3      Review of Systems  Constitutional: Positive for diaphoresis and fatigue. Negative for fever (few days ago).  HENT: Negative for sore throat.   Eyes: Negative for visual disturbance.  Respiratory: Positive for cough (milf). Negative for shortness of breath.   Cardiovascular: Positive for chest pain.  Gastrointestinal: Positive for nausea and abdominal pain (rlq mild pain, now resolved). Negative for vomiting, diarrhea and constipation.  Genitourinary: Negative for difficulty urinating.  Musculoskeletal: Negative for back pain and neck pain.  Skin: Negative for rash.  Neurological: Positive for light-headedness. Negative for syncope and headaches.      Allergies  Review of patient's allergies indicates no known allergies.  Home Medications   Prior to Admission medications   Medication Sig Start Date End Date Taking? Authorizing Provider  acetaminophen (TYLENOL) 500 MG tablet Take 1,000 mg by mouth every 8 (eight) hours as needed (pain).    Historical Provider, MD  aspirin EC 81 MG EC tablet Take 1 tablet (81 mg total) by mouth daily. Patient not taking: Reported on 08/18/2014 04/28/14   Alexa Sherral Hammers, MD  indomethacin (INDOCIN) 25 MG capsule Take 1 capsule (25 mg total) by  mouth 2 (two) times daily with a meal. As needed for back pain Patient not taking: Reported on 01/15/2015 06/01/14   Audelia Hives Presson, PA  meloxicam (MOBIC) 7.5 MG tablet Take 1 tablet (7.5 mg total) by mouth daily. 0000000   Delora Fuel, MD  oxyCODONE-acetaminophen (PERCOCET) 5-325 MG per tablet Take 1 tablet by mouth every 4 (four) hours as needed for moderate pain. 0000000   Delora Fuel, MD  topiramate (TOPAMAX) 25 MG tablet Take 1 tablet (25 mg total) by mouth at bedtime. Patient not taking: Reported on 01/15/2015 04/28/14   Alexa Sherral Hammers, MD   BP 111/59 mmHg  Pulse 85  Temp(Src) 97.9 F (36.6 C) (Oral)  Resp 22  SpO2 99%  LMP 09/15/2013 Physical Exam  Constitutional: She is oriented to person, place, and time. She appears well-developed and well-nourished. No distress.  HENT:  Head: Normocephalic and atraumatic.  Eyes: Conjunctivae and EOM are normal.  Neck: Normal range of motion.  Cardiovascular: Normal rate, regular rhythm, normal heart sounds and intact distal pulses.  Exam reveals no gallop and no friction rub.   No murmur heard. Pulmonary/Chest: Effort normal and breath sounds normal. No respiratory distress. She has no wheezes. She has no rales. She exhibits tenderness.  Abdominal: Soft. She exhibits no distension. There is no tenderness. There is no guarding.  Musculoskeletal: She exhibits no edema or tenderness.  Neurological: She is alert and oriented to person, place, and time.  Skin: Skin is warm and dry. No rash noted. She is not diaphoretic. No erythema.  Nursing note and vitals reviewed.   ED Course  Procedures (including critical care time) Labs Review Labs Reviewed  BASIC METABOLIC PANEL  CBC  HEPATIC FUNCTION PANEL  I-STAT [place], ED  I-STAT TROPOININ, ED    Imaging Review No results found. I have personally reviewed and evaluated these images and lab results as part of my medical decision-making.   EKG Interpretation   Date/Time:  Friday  June 04 2015 09:16:21 EST Ventricular Rate:  84 PR Interval:  142 QRS Duration: 90 QT Interval:  359 QTC Calculation: 424 R Axis:   -22 Text Interpretation:  Sinus rhythm Left ventricular hypertrophy No  significant change since last tracing Confirmed by [facility] MD, Trica Usery  ([id number]) on 06/04/2015 9:22:12 AM      Dr. Lia Foyer 2013 [facility] Final Conclusions:  1. Heavily calcified small D2 with ostial and mid disease --- Not suitable for PCI 2. Preserved LV function 3. Mild irregularities of the LAD MDM   Final diagnoses:  None   54yo female with history of htn, hlpd, obesity, prior cath showing mild LAD irregularities and other abnormalities not suitable for PCI presents with concern for chest tightness and  diaphoresis.  Differential diagnosis for chest pain includes pulmonary embolus, dissection, pneumothorax, pneumonia, ACS, myocarditis, pericarditis.  EKG was done and evaluate by me and showed no acute ST changes and no signs of pericarditis. Chest x-ray was done and evaluated by me and radiology and showed no sign of pneumonia or pneumothorax. Pt is low risk [place] and have low suspicion for PE by risk factors/VS or description.  HEAR score is 6.  Initial troponin negative.  Pt given aspirin/nitro.  Given hx, risk factors, some abnormalities in cath 3 years ago, consulted Cardiology for furhter care. Pt to be admitted for further evaluation of chest pain.  Gareth Morgan, MD 06/04/15 [phone]

## 2015-06-04 NOTE — Progress Notes (Signed)
TR BAND REMOVAL  LOCATION:    right radial  DEFLATED PER PROTOCOL:    Yes.    TIME BAND OFF / DRESSING APPLIED:    21:00   SITE UPON ARRIVAL:    Level 0  SITE AFTER BAND REMOVAL:    Level 0  CIRCULATION SENSATION AND MOVEMENT:    Within Normal Limits   Yes.    COMMENTS:   Pt tolerated removal of TR band without complications, will continue to monitor patient.

## 2015-06-04 NOTE — H&P (Signed)
Patient ID: Hannah Huerta MRN: [id number], DOB/AGE: Nov 11, 1960   Admit date: 06/04/2015   Primary Physician: Osa Craver, MD Primary Cardiologist: Dr. Percival Spanish (per Dr. Jacalyn Lefevre consult note on 03/25/2012)  Pt. Profile:  morbidly obese 54 year old AA female with past medical history of HTN, HLD, prediabetes and nonobstructive CAD presented with intermittent chest pain, dizziness, shortness of breath and nausea for past 3 days  Problem List  Past Medical History  Diagnosis Date  . Hypertension   . GERD (gastroesophageal reflux disease)   . Anemia   . Sickle cell trait ([place])   . Morbid obesity with BMI of 50.0-59.9, adult ([place])   . Chest pain     a. 02/2012 abnl myoview - inferoapical reverisbility concerning for ischemia, EF 59%;   02/2012 nonobstructive cath  . Abnormal Pap smear of cervix     pt couldnt state what type of abnormality  . HLD (hyperlipidemia)   . Chronic migraine   . Prediabetes     Past Surgical History  Procedure Laterality Date  . Tubal ligation    . Spine surgery      fusion  . Left heart catheterization with coronary angiogram N/A 03/08/2012    Procedure: LEFT HEART CATHETERIZATION WITH CORONARY ANGIOGRAM;  Surgeon: Hillary Bow, MD;  Location: [facility] CATH LAB;  Service: Cardiovascular;  Laterality: N/A;  . Back surgery      x1 lumbar fusion  . Cholecystectomy      laparoscopic  . Colonoscopy with propofol N/A 09/04/2014    Procedure: COLONOSCOPY WITH PROPOFOL;  Surgeon: [name], MD;  Location: [facility] ENDOSCOPY;  Service: Endoscopy;  Laterality: N/A;     Allergies  No Known Allergies  HPI  The patient is a morbidly obese 54 year old AA female with past medical history of HTN, HLD, prediabetes and nonobstructive CAD. She is not on any BP or statin medication at home. she was evaluated for chest pain in August 2013, CTA at the time was negative for PE, but Myoview was abnormal showing inferoapical ischemia. She underwent cardiac  catheterization on 03/08/2012 which showed EF 55-65%, mild irregularities of LAD, heavily calcified small D2 not suitable for PCI. She was last seen by cardiology service when she presented with recurrent chest pain in March 2015, Myoview obtained on 09/17/2013 showed EF 60% negative for pharmacologic-stress induced ischemia.   She has not had any problem or recurrent chest pain since. She works at [employer]. She had fever last Thursday however resolved by Sunday. She denies any recent chills or cough or runny nose. 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 She decided to sit down and rest, however and did not immediately provide relief and her symptom eventually went away after 35-40 minutes. She continued working without seeking medical attention. In the last 2 days, the symptom recurred multiple times both at rest and with exertion prompting her to seek at [facility] on 06/04/2015.   On arrival, her systolic blood pressure is 110s. Troponin was negative. Significant laboratory finding include creatinine of 1.12, hemoglobin 10.8. EKG was negative for significant ST-T wave changes. Cardiology has been consulted for chest pain.  She denies any obvious exacerbating or alleviating factors including deep inspiration, body rotation, positional changes or palpation.   Home Medications  Prior to Admission medications   Medication Sig Start Date End Date Taking? Authorizing Provider  acetaminophen (TYLENOL) 500 MG tablet Take 1,000 mg by  mouth every 8 (eight) hours as needed (pain).   Yes Historical Provider, MD  indomethacin (INDOCIN) 25 MG capsule Take 1 capsule (25 mg total) by mouth 2 (two) times daily with a meal. As needed for back pain Patient not taking: Reported on 01/15/2015 06/01/14   Audelia Hives Presson, PA  meloxicam (MOBIC) 7.5 MG tablet Take 1 tablet (7.5 mg total) by mouth daily.  0000000   Delora Fuel, MD    Family History  Family History  Problem Relation Age of Onset  . Coronary artery disease Father 56  . Heart disease Father   . Other Father     died in a MVA  . Hypertension Father   . Sudden death Mother 68    Questionable MI  . Diabetes Mother   . Hypertension Mother   . Hypertension Sister   . Pancreatic cancer Maternal Aunt   . Stroke Paternal Grandmother   . Breast cancer Maternal Aunt     after age 38  . Colon cancer Neg Hx   . Colon polyps Sister   . Gallbladder disease Neg Hx   . Esophageal cancer Neg Hx     Social History  Social History   Social History  . Marital Status: Married    Spouse Name: N/A  . Number of Children: 3  . Years of Education: N/A   Occupational History  . Works in the [employer]  . Smoking status: Never Smoker   . Smokeless tobacco: Never Used  . Alcohol Use: No  . Drug Use: No  . Sexual Activity: Yes    Birth Control/ Protection: Surgical   Other Topics Concern  . Not on file   Social History Narrative   Lives at home with husband and two of her sons.       Review of Systems General:  No chills, night sweats or weight changes. +fever last week, resolved Cardiovascular:  No dyspnea on exertion, orthopnea, palpitations, paroxysmal nocturnal dyspnea. +chest pain, chronic 1 + lower extremity edema Dermatological: No rash, lesions/masses Respiratory: No cough, dyspnea Urologic: No hematuria, dysuria Abdominal:   No nausea, vomiting, diarrhea, bright red blood per rectum, melena, or hematemesis Neurologic:  No visual changes, wkns, changes in mental status. All other systems reviewed and are otherwise negative except as noted above.  Physical Exam  Blood pressure 124/81, pulse 78, temperature 97.9 F (36.6 C), temperature source Oral, resp. rate 27, last menstrual period 09/15/2013, SpO2 100 %.  General: Pleasant, NAD Psych: Normal affect. Neuro: Alert and  oriented X 3. Moves all extremities spontaneously. HEENT: Normal  Neck: Supple without bruits or JVD. Lungs:  Resp regular and unlabored, CTA. Heart: RRR no s3, s4, or murmurs. Abdomen: Soft, non-tender, non-distended, BS + x 4.  Extremities: No clubbing, cyanosis or edema. DP/PT/Radials 2+ and equal bilaterally.  Labs  Troponin [facility] of Care Test)  Recent Labs  06/04/15 0932  TROPIPOC 0.01   No results for input(s): CKTOTAL, CKMB, TROPONINI in the last 72 hours. Lab Results  Component Value Date   WBC 4.3 06/04/2015   HGB 10.8* 06/04/2015   HCT 33.1* 06/04/2015   MCV 74.5* 06/04/2015   PLT 233 06/04/2015     Recent Labs Lab 06/04/15 0924  NA 139  K 3.1*  CL 104  CO2 29  BUN 10  CREATININE 1.12*  CALCIUM 8.6*  PROT 7.1  BILITOT 0.5  ALKPHOS 56  ALT 35  AST 56*  GLUCOSE  101*   Lab Results  Component Value Date   CHOL 177 09/17/2013   HDL 57 09/17/2013   LDLCALC 100* 09/17/2013   TRIG 98 09/17/2013   Lab Results  Component Value Date   DDIMER 0.59* 03/08/2012     Radiology/Studies  Dg Chest 2 View  06/04/2015  CLINICAL DATA:  Chest pain for 2 days with diaphoresis and lightheadedness. Initial encounter. EXAM: CHEST  2 VIEW COMPARISON:  PA and lateral chest 10/31/2013. FINDINGS: The lungs are clear. Heart size is normal. No pneumothorax or pleural effusion. Thoracic spondylosis noted. IMPRESSION: No acute disease. Electronically Signed   By: Inge Rise M.D.   On: 06/04/2015 09:51    ECG  NSR without significant ST-T wave changes  Echocardiogram 09/17/2013  LV EF: 55%  ------------------------------------------------------------ Indications:   Chest pain 786.51.  ------------------------------------------------------------ History:  Risk factors: Hypertension. Morbidly obese. Dyslipidemia.  ------------------------------------------------------------ Study Conclusions  - Left ventricle: The cavity size was normal. Wall  thickness was normal. The estimated ejection fraction was 55%. Wall motion was normal; there were no regional wall motion abnormalities. Findings consistent with left ventricular diastolic dysfunction. - Mitral valve: Mild regurgitation. - Right ventricle: The cavity size was normal. Systolic function was normal. - Pulmonary arteries: PA peak pressure: 62mm Hg (S).    ASSESSMENT AND PLAN  1. Chest pain both at rest and with exertion  - cardiac risk factors h/o CAD, HTN, HLD, FHx of early CAD, morbid obesity  - discussed both 2 day myoview and cardiac catheterization, patient seems to be leaning toward myoview despite knowing that if test is abnormal she will need cardiac catheterization anyway. Given her body habitus, myoview result may not be very clear.  2. Nonobstructive CAD after abnormal myoview in 02/3012  - Myoview 03/07/2012 was abnormal showing inferoapical ischemia  - Cath 03/08/2012 EF 55-65%, mild irregularities of LAD, heavily calcified small D2 not suitable for PCI  - last myoview 09/17/2013 EF 60% negative for pharmacologic-stress induced ischemia  3. HTN: not on BP med at home, but SBP 110s in ED 4. HLD: not on statin 5. prediabetes   Signed, Almyra Deforest, PA-C 06/04/2015, 11:50 AM   The patient was seen, examined and discussed with Almyra Deforest, PA-C and I agree with the above.   54 year old obese female with impaired glucose tolerance, hypertension, hyperlipidemia known non-obstructive CAD on cath in 2013 (mild disease in LAD and 60% stenosis in a small 2.diagonal branch) who is coming with progressively worsening exertional chest pain with prolonged resolution at rest. Troponin is negative x 1, ECG shows SR, unchanged from prior.   She was on no cardiac meds, we will start aspirin 81 mg po daily, atorvastatin 80 mg po daily, carvedilol 3.125 mg po BID.   We will schedule her for a cardiac cath today as her stress test would be suboptimal considering her size.    Dorothy Spark 06/04/2015

## 2015-06-05 ENCOUNTER — Encounter (HOSPITAL_COMMUNITY): Payer: Self-pay | Admitting: Cardiology

## 2015-06-05 ENCOUNTER — Other Ambulatory Visit: Payer: Self-pay | Admitting: Cardiology

## 2015-06-05 DIAGNOSIS — R509 Fever, unspecified: Secondary | ICD-10-CM | POA: Diagnosis not present

## 2015-06-05 DIAGNOSIS — E785 Hyperlipidemia, unspecified: Secondary | ICD-10-CM

## 2015-06-05 DIAGNOSIS — E8881 Metabolic syndrome: Secondary | ICD-10-CM

## 2015-06-05 DIAGNOSIS — Z9889 Other specified postprocedural states: Secondary | ICD-10-CM

## 2015-06-05 DIAGNOSIS — R0789 Other chest pain: Secondary | ICD-10-CM | POA: Diagnosis not present

## 2015-06-05 DIAGNOSIS — I2584 Coronary atherosclerosis due to calcified coronary lesion: Secondary | ICD-10-CM | POA: Diagnosis not present

## 2015-06-05 DIAGNOSIS — E876 Hypokalemia: Secondary | ICD-10-CM

## 2015-06-05 DIAGNOSIS — I2510 Atherosclerotic heart disease of native coronary artery without angina pectoris: Secondary | ICD-10-CM | POA: Diagnosis present

## 2015-06-05 DIAGNOSIS — R0602 Shortness of breath: Secondary | ICD-10-CM | POA: Diagnosis not present

## 2015-06-05 HISTORY — DX: Atherosclerotic heart disease of native coronary artery without angina pectoris: I25.10

## 2015-06-05 HISTORY — DX: Metabolic syndrome: E88.81

## 2015-06-05 HISTORY — DX: Hypokalemia: E87.6

## 2015-06-05 HISTORY — DX: Metabolic syndrome: E88.810

## 2015-06-05 HISTORY — DX: Other specified postprocedural states: Z98.890

## 2015-06-05 LAB — URINALYSIS, ROUTINE W REFLEX MICROSCOPIC
Bilirubin Urine: NEGATIVE
Glucose, UA: NEGATIVE mg/dL
Hgb urine dipstick: NEGATIVE
Ketones, ur: NEGATIVE mg/dL
Leukocytes, UA: NEGATIVE
Nitrite: NEGATIVE
Protein, ur: 30 mg/dL — AB
Specific Gravity, Urine: 1.036 — ABNORMAL HIGH (ref 1.005–1.030)
pH: 5.5 (ref 5.0–8.0)

## 2015-06-05 LAB — BASIC METABOLIC PANEL
Anion gap: 7 (ref 5–15)
BUN: 9 mg/dL (ref 6–20)
CO2: 28 mmol/L (ref 22–32)
Calcium: 8.3 mg/dL — ABNORMAL LOW (ref 8.9–10.3)
Chloride: 104 mmol/L (ref 101–111)
Creatinine, Ser: 1.07 mg/dL — ABNORMAL HIGH (ref 0.44–1.00)
GFR calc Af Amer: >60 mL/min (ref >60)
GFR calc non Af Amer: 58 mL/min — ABNORMAL LOW (ref >60)
Glucose, Bld: 109 mg/dL — ABNORMAL HIGH (ref 65–99)
Potassium: 3.8 mmol/L (ref 3.5–5.1)
Sodium: 139 mmol/L (ref 135–145)

## 2015-06-05 LAB — URINE MICROSCOPIC-ADD ON: RBC / HPF: NONE SEEN RBC/hpf (ref 0–5)

## 2015-06-05 LAB — GLUCOSE, CAPILLARY: Glucose-Capillary: 108 mg/dL — ABNORMAL HIGH (ref 65–99)

## 2015-06-05 LAB — TROPONIN I
Troponin I: <0.03 ng/mL (ref <0.031)
Troponin I: <0.03 ng/mL (ref <0.031)

## 2015-06-05 MED ORDER — SPIRONOLACTONE 25 MG PO TABS: 25.0000 mg | ORAL_TABLET | Freq: Every day | ORAL | Status: DC

## 2015-06-05 MED ORDER — ATORVASTATIN CALCIUM 40 MG PO TABS: 40.0000 mg | ORAL_TABLET | Freq: Every day | ORAL | Status: DC

## 2015-06-05 MED ORDER — ASPIRIN 81 MG PO TBEC: 81.0000 mg | DELAYED_RELEASE_TABLET | Freq: Every day | ORAL | Status: DC

## 2015-06-05 MED ORDER — CARVEDILOL 6.25 MG PO TABS: 3.1250 mg | ORAL_TABLET | Freq: Two times a day (BID) | ORAL | Status: DC

## 2015-06-05 MED ORDER — SPIRONOLACTONE 25 MG PO TABS
25.0000 mg | ORAL_TABLET | Freq: Every day | ORAL | Status: DC
  Administered 2015-06-05: 25 mg via ORAL
  Filled 2015-06-05: qty 1

## 2015-06-05 NOTE — Discharge Summary (Signed)
Physician Discharge Summary       Patient ID: Hannah Huerta MRN: [id number] DOB/AGE: 54-Oct-1962 54 y.o.  Admit date: 06/04/2015 Discharge date: 06/05/2015 Primary Cardiologist:Dr. Hochrein   Discharge Diagnoses:  Principal Problem:   Chest pain, neg MI, possible GI Active Problems:   S/P cardiac catheterization, non obstructive disease 06/04/15   Dizziness   Severe obesity (BMI >= 40) (HCC)   CAD in native artery   Fever   Hypokalemia   Metabolic syndrome   Discharged Condition: good  Procedures: 06/04/15 cardiac cath with Dr. Laurena Bering Course:  54 year old AA female with past medical history of HTN, HLD, prediabetes and nonobstructive CAD. She is not on any BP or statin medication at home. she was evaluated for chest pain in August 2013, CTA at the time was negative for PE, but Myoview was abnormal showing inferoapical ischemia. She underwent cardiac catheterization on 03/08/2012 which showed EF 55-65%, mild irregularities of LAD, heavily calcified small D2 not suitable for PCI. She was last seen by cardiology service when she presented with recurrent chest pain in March 2015, Myoview obtained on 09/17/2013 showed EF 60% negative for pharmacologic-stress induced ischemia.   Presented to ER 06/04/15 with lt sided chest pain, after unloading boxes.  Left-sided substernal pressure with diaphoresis, dizziness, nausea and shortness of breath. She decided to sit down and rest, however it did not immediately provide relief and her symptom eventually went away after 35-40 minutes. She continued working without seeking medical attention. In the last 2 days, the symptom recurred multiple times both at rest and with exertion.  In ER  EKG was negative for significant ST-T wave changes.  With her symptoms it was decided cardiac cath was needed.  She was hypokalemic and this was treated.  Cath with non obstructive disease.  She had fever prior to admit and the night after cath.  Blood  cultures done results pending.  But WBC is normal, u/a normal, CXR normal.  Mild dry cough. She was seen and evaluated by Dr. Acie Fredrickson and felt stable for discharge.  meds have been adjusted with BB, statin and aldactone.  She will need to have follow up BMP and Lipid panel on Wed. 06/09/15.  She will follow up in office in 2 weeks.  She was instructed to see PCP as well.   Consults: cardiology  Significant Diagnostic Studies:  BMP Latest Ref Rng 06/05/2015 06/04/2015 06/04/2015  Glucose 65 - 99 mg/dL 109(H) - 101(H)  BUN 6 - 20 mg/dL 9 - 10  Creatinine 0.44 - 1.00 mg/dL 1.07(H) 0.99 1.12(H)  Sodium 135 - 145 mmol/L 139 - 139  Potassium 3.5 - 5.1 mmol/L 3.8 - 3.1(L)  Chloride 101 - 111 mmol/L 104 - 104  CO2 22 - 32 mmol/L 28 - 29  Calcium 8.9 - 10.3 mg/dL 8.3(L) - 8.6(L)   CBC Latest Ref Rng 06/04/2015 06/04/2015 04/27/2014  WBC 4.0 - 10.5 K/uL 5.2 4.3 4.3  Hemoglobin 12.0 - 15.0 g/dL 12.2 10.8(L) 11.4(L)  Hematocrit 36.0 - 46.0 % 38.1 33.1(L) 35.1(L)  Platelets 150 - 400 K/uL 272 233 285    Hepatic Function Latest Ref Rng 06/04/2015 04/27/2014 03/25/2012  Total Protein 6.5 - 8.1 g/dL 7.1 7.9 7.4  Albumin 3.5 - 5.0 g/dL 3.0(L) 3.4(L) 3.3(L)  AST 15 - 41 U/L 56(H) 22 16  ALT 14 - 54 U/L 35 13 11  Alk Phosphatase 38 - 126 U/L 56 79 71  Total Bilirubin 0.3 - 1.2 mg/dL 0.5 0.2(L) 0.1(L)  Bilirubin, Direct 0.1 - 0.5 mg/dL <0.1(L) - -    troponins neg X 3 <0.03  Blood cultures pending.   CHEST 2 VIEW COMPARISON: PA and lateral chest 10/31/2013. FINDINGS: The lungs are clear. Heart size is normal. No pneumothorax or pleural effusion. Thoracic spondylosis noted. IMPRESSION: No acute disease.   Cardiac Cath:_0 1. Ost 1st Diag to 1st Diag lesion, 50% stenosed. 2. Ost LAD to Prox LAD lesion, 40% stenosed.   Widely patent very tortuous coronary arteries with proximal LAD and first diagonal calcification and 40% and 50% lesions respectively. No significant change compared to  prior.  Normal left ventricular systolic function and hemodynamics. EF at least 55%.  RECOMMENDATIONS:   Treat hypokalemia   Consider other diagnostic possibilities to explain the patient's chest discomfort.   Discharge Exam: Blood pressure 155/84, pulse 86, temperature 99.1 F (37.3 C), temperature source Oral, resp. rate 18, height 5' 2" (1.575 m), weight 295 lb 3.1 oz (133.9 kg), last menstrual period 09/15/2013, SpO2 96 %.  Disposition: 01-Home or Self Care     Medication List    STOP taking these medications        indomethacin 25 MG capsule  Commonly known as:  INDOCIN      TAKE these medications        acetaminophen 500 MG tablet  Commonly known as:  TYLENOL  Take 1,000 mg by mouth every 8 (eight) hours as needed (pain).     aspirin 81 MG EC tablet  Take 1 tablet (81 mg total) by mouth daily.     atorvastatin 40 MG tablet  Commonly known as:  LIPITOR  Take 1 tablet (40 mg total) by mouth daily at 6 PM.     carvedilol 6.25 MG tablet  Commonly known as:  COREG  Take 0.5 tablets (3.125 mg total) by mouth 2 (two) times daily with a meal.     meloxicam 7.5 MG tablet  Commonly known as:  MOBIC  Take 1 tablet (7.5 mg total) by mouth daily.     spironolactone 25 MG tablet  Commonly known as:  ALDACTONE  Take 1 tablet (25 mg total) by mouth daily.       Follow-up Information    Follow up with Minus Breeding, MD.   Specialty:  Cardiology   Why:  office will call with date and time   Contact information:   [address] [place] [address] [phone]        Discharge Instructions: You will need lab work Wed.  Lab work done that day- will need to be done early AM do not eat or drink before blood draw.  Call if any questions.  Call [facility] at [phone] if any bleeding, swelling or drainage at cath site.  May shower, no tub baths for 48 hours for groin sticks. No lifting over 5 pounds for 3 days.  No  Driving for 3 days  Heart healthy diet.  We added medication for cholesterol, BP   Follow up with your primary doctor.     Signed: Isaiah Serge Nurse Practitioner-Certified [facility]: HEARTCARE 06/05/2015, 10:15 AM  Time spent on discharge : > 30 minutes.     Attending Note:   The patient was seen and examined.  Agree with assessment and plan as noted above.  Changes made to the above note as needed.  See my note from day of discharge   Ramond Dial., MD, [facility] 06/06/2015, 1:38  PM [address]  [place] Pager [phone]

## 2015-06-05 NOTE — Progress Notes (Signed)
PT PROFILE: 54 year old AA female with past medical history of HTN, HLD, prediabetes and nonobstructive CAD presented with intermittent chest pain, dizziness, shortness of breath and nausea for past 3 days, neg MI, cath with non obstructive disease.  During the night fever.    Subjective: No complaints has had dry cough, no further chest pain  Objective: Vital signs in last 24 hours: Temp:  [97.8 F (36.6 C)-100.6 F (38.1 C)] 98.9 F (37.2 C) (11/19 0354) Pulse Rate:  [0-170] 94 (11/19 0354) Resp:  [0-92] 19 (11/19 0354) BP: (107-177)/(49-102) 158/80 mmHg (11/19 0354) SpO2:  [0 %-100 %] 95 % (11/19 0354) Weight:  [295 lb 3.1 oz (133.9 kg)-300 lb (136.079 kg)] 295 lb 3.1 oz (133.9 kg) (11/19 0354) Weight change:  Last BM Date: 06/03/15 Intake/Output from previous day: -130 since admit 11/18 0701 - 11/19 0700 In: 269.9 [I.V.:269.9] Out: 400 [Urine:400] Intake/Output this shift:    PE: General:Pleasant affect, NAD Skin:Warm and dry, brisk capillary refill HEENT:normocephalic, sclera clear, mucus membranes moist Neck:supple, no JVD, no bruits  Heart:S1S2 RRR without murmur, gallup, rub or click Lungs:clear without rales, rhonchi, or wheezes [id number], non tender, + BS, do not palpate liver spleen or masses Ext:no lower ext edema, 2+ pedal pulses, 2+ radial pulses- cath site without hematoma Neuro:alert and oriented, MAE, follows commands, + facial symmetry Tele:  SR    Lab Results:  Recent Labs  06/04/15 0924 06/04/15 1744  WBC 4.3 5.2  HGB 10.8* 12.2  HCT 33.1* 38.1  PLT 233 272   BMET  Recent Labs  06/04/15 0924 06/04/15 1744 06/05/15 0512  NA 139  --  139  K 3.1*  --  3.8  CL 104  --  104  CO2 29  --  28  GLUCOSE 101*  --  109*  BUN 10  --  9  CREATININE 1.12* 0.99 1.07*  CALCIUM 8.6*  --  8.3*    Recent Labs  06/04/15 2209 06/05/15 0512  TROPONINI <0.03 <0.03    Lab Results  Component Value Date   CHOL 177 09/17/2013   HDL 57  09/17/2013   LDLCALC 100* 09/17/2013   TRIG 98 09/17/2013   CHOLHDL 3.1 09/17/2013   Lab Results  Component Value Date   HGBA1C 6.5 05/13/2014     Lab Results  Component Value Date   TSH 1.185 09/17/2013    Hepatic Function Panel  Recent Labs  06/04/15 0924  PROT 7.1  ALBUMIN 3.0*  AST 56*  ALT 35  ALKPHOS 56  BILITOT 0.5  BILIDIR <0.1*  IBILI NOT CALCULATED   No results for input(s): CHOL in the last 72 hours. No results for input(s): PROTIME in the last 72 hours.     Studies/Results: Dg Chest 2 View  06/04/2015  CLINICAL DATA:  Chest pain for 2 days with diaphoresis and lightheadedness. Initial encounter. EXAM: CHEST  2 VIEW COMPARISON:  PA and lateral chest 10/31/2013. FINDINGS: The lungs are clear. Heart size is normal. No pneumothorax or pleural effusion. Thoracic spondylosis noted. IMPRESSION: No acute disease. Electronically Signed   By: Inge Rise M.D.   On: 06/04/2015 09:51    Medications: I have reviewed the patient's current medications. Scheduled Meds: . aspirin EC  81 mg Oral Daily  . atorvastatin  80 mg Oral q1800  . carvedilol  3.125 mg Oral BID WC  . enoxaparin (LOVENOX) injection  40 mg Subcutaneous Q24H  . meloxicam  7.5 mg Oral Daily  . potassium  chloride  40 mEq Oral BID  . sodium chloride  3 mL Intravenous Q12H   Continuous Infusions:  PRN Meds:.sodium chloride, acetaminophen, nitroGLYCERIN, ondansetron (ZOFRAN) IV, oxyCODONE-acetaminophen, sodium chloride  Assessment/Plan: Principal Problem:   Chest pain Active Problems:   S/P cardiac catheterization, non obstructive disease 06/04/15   Dizziness   Severe obesity (BMI >= 40) (HCC)   CAD in native artery-non obstructive- will decrease lipitor to 40 mg and she will need lipid panel as outpt.    Fever-blood cultures done/CXR WNL/ UA clear /WBC normal   Hypokalemia- now 3.8     needs lipid panel as outpt. With CAD will d/c with lipitor 40mg .  Time spent with pt. :15  minutes. [facility]  Nurse Practitioner Certified Pager [phone] or after 5pm and on weekends call [phone] 06/05/2015, 7:17 AM   Attending Note:   The patient was seen and examined.  Agree with assessment and plan as noted above.  Changes made to the above note as needed.  1. HTN:  In the setting of hypokalemia.  I suspect she has an aldosterone excess.  Will add Aldactone 25 mg a day .  Will increase Coreg 6.25 BID .  Follow up with her medical doctor and with [place]. Will need a BMP next week   2. Chest pain :  milld - moderate CAD by cath.  The pain is noncardiac.   Agree with atorvastatin 40.     3. Pre-diabetes:  Needs to get back established with a medical doctor .  Follow up with Dr. Percival Spanish at [facility] Wednesday    Thayer Headings, Brooke Bonito., MD, [facility] 06/05/2015, 8:10 AM [address]  [place] Pager [phone]

## 2015-06-05 NOTE — Discharge Instructions (Signed)
You will need lab work Wed.  Lab work done that day- will need to be done early AM do not eat or drink before blood draw.  Call if any questions.  Lab at [facility].   Call [facility] at [phone] if any bleeding, swelling or drainage at cath site.  May shower, no tub baths for 48 hours for groin sticks. No lifting over 5 pounds for 3 days.  No Driving for 3 days  Heart healthy diet.  We added medication for cholesterol, BP   Follow up with your primary doctor.

## 2015-06-07 ENCOUNTER — Telehealth: Payer: Self-pay | Admitting: Cardiology

## 2015-06-07 ENCOUNTER — Ambulatory Visit (INDEPENDENT_AMBULATORY_CARE_PROVIDER_SITE_OTHER): Payer: BLUE CROSS/BLUE SHIELD | Admitting: Nurse Practitioner

## 2015-06-07 ENCOUNTER — Encounter (HOSPITAL_COMMUNITY): Payer: Self-pay | Admitting: Interventional Cardiology

## 2015-06-07 VITALS — BP 130/80 | HR 83 | Temp 98.6°F | Ht 62.0 in | Wt 295.8 lb

## 2015-06-07 DIAGNOSIS — R0789 Other chest pain: Secondary | ICD-10-CM

## 2015-06-07 NOTE — Telephone Encounter (Signed)
Returned call to patient she stated she was in hospital recently and still feels bad.Stated she has post hospital appointment 06/22/15,but would like to be seen sooner.Stated she is light headed,dizzy,nausea.No chest pain Stated she does not have a PCP.Appointment scheduled with Truitt Merle NP today at 3:00 pm at [facility] office.

## 2015-06-07 NOTE — Progress Notes (Signed)
CARDIOLOGY OFFICE NOTE  Date:  06/07/2015    Hannah Huerta Date of Birth: 1961-01-23 Medical Record [id number]  PCP:  Osa Craver, MD  Cardiologist:  [facility]    Chief Complaint  Patient presents with  . Multitude of somatic complaints    Work in visit - seen for Dr. Percival Spanish     History of Present Illness: Hannah Huerta is a 54 y.o. female who presents today for a work in visit. Seen for Dr. Percival Spanish. She has a past medical history of HTN, HLD, prediabetes and nonobstructive CAD.   She was evaluated for chest pain in August 2013, CTA at the time was negative for PE, but Myoview was abnormal showing inferoapical ischemia. She underwent cardiac catheterization on 03/08/2012 which showed EF 55-65%, mild irregularities of LAD, heavily calcified small D2 not suitable for PCI. She was last seen by cardiology service when she presented with recurrent chest pain in March 2015, Myoview obtained on 09/17/2013 showed EF 60% negative for pharmacologic-stress induced ischemia.   Presented to ER 06/04/15 with lt sided chest pain, after unloading boxes. Left-sided substernal pressure with diaphoresis, dizziness, nausea and shortness of breath. She decided to sit down and rest, however it did not immediately provide relief and her symptom eventually went away after 35-40 minutes. She continued working without seeking medical attention. The symptom recurred multiple times both at rest and with exertion. In ER EKG was negative for significant ST-T wave changes. With her symptoms it was decided cardiac cath was needed. She was hypokalemic and this was treated.  Cath with non obstructive disease. She had fever prior to admit and the night after cath. Blood cultures done results pending. But WBC is normal, u/a normal, CXR normal. Mild dry cough. She was seen and evaluated by Dr. Acie Fredrickson and felt stable for discharge. Her meds have been adjusted with BB, statin and aldactone.   Comes in  today. Here with her husband. Says she has fever and is nauseated and dizzy. Does not have PCP. Husband thinks she needs to see Endocrine. Has had fever, chills, thyroid issues, hot flashes, etc. No cardiac complaint reported. Does not have a PCP. She says she is having fevers but does not have a thermometer to check her temperature. She has no energy. Dry cough persists. Blood cultures negative so far. She was previously seen at [facility] and is willing to go back.   Past Medical History  Diagnosis Date  . Hypertension   . GERD (gastroesophageal reflux disease)   . Anemia   . Sickle cell trait ([place])   . Morbid obesity with BMI of 50.0-59.9, adult ([name])   . Chest pain     a. 02/2012 abnl myoview - inferoapical reverisbility concerning for ischemia, EF 59%;   02/2012 nonobstructive cath  . Abnormal Pap smear of cervix     pt couldnt state what type of abnormality  . HLD (hyperlipidemia)   . Chronic migraine   . Prediabetes   . S/P cardiac catheterization, non obstructive disease 06/04/15 06/05/2015  . CAD in native artery 06/05/2015  . Hypokalemia 06/05/2015  . Metabolic syndrome [id number]    Past Surgical History  Procedure Laterality Date  . Tubal ligation    . Spine surgery      fusion  . Left heart catheterization with coronary angiogram N/A 03/08/2012    Procedure: LEFT HEART CATHETERIZATION WITH CORONARY ANGIOGRAM;  Surgeon: Hillary Bow, MD;  Location: [facility] CATH LAB;  Service: Cardiovascular;  Laterality: N/A;  . Back surgery      x1 lumbar fusion  . Cholecystectomy      laparoscopic  . Colonoscopy with propofol N/A 09/04/2014    Procedure: COLONOSCOPY WITH PROPOFOL;  Surgeon: [name], MD;  Location: [facility] ENDOSCOPY;  Service: Endoscopy;  Laterality: N/A;  . Cardiac catheterization N/A 06/04/2015    Procedure: Left Heart Cath and Coronary Angiography;  Surgeon: [name], MD;  Location: [place] CV LAB;  Service: Cardiovascular;  Laterality: N/A;      Medications: Current Outpatient Prescriptions  Medication Sig Dispense Refill  . acetaminophen (TYLENOL) 500 MG tablet Take 1,000 mg by mouth every 8 (eight) hours as needed (pain).    [name] aspirin EC 81 MG EC tablet Take 1 tablet (81 mg total) by mouth daily.    [name] atorvastatin (LIPITOR) 40 MG tablet Take 1 tablet (40 mg total) by mouth daily at 6 PM. 30 tablet 6  . carvedilol (COREG) 6.25 MG tablet Take 0.5 tablets (3.125 mg total) by mouth 2 (two) times daily with a meal. 60 tablet 6  . meloxicam (MOBIC) 7.5 MG tablet Take 1 tablet (7.5 mg total) by mouth daily. 30 tablet 0  . spironolactone (ALDACTONE) 25 MG tablet Take 1 tablet (25 mg total) by mouth daily. 30 tablet 6   No current facility-administered medications for this visit.   Facility-Administered Medications Ordered in Other Visits  Medication Dose Route Frequency Provider Last Rate Last Dose  . topiramate (TOPAMAX) tablet 25 mg  25 mg Oral QHS Alexa Sherral Hammers, MD        Allergies: No Known Allergies  Social History: The patient  reports that she has never smoked. She has never used smokeless tobacco. She reports that she does not drink alcohol or use illicit drugs.   Family History: The patient's family history includes Breast cancer in her maternal aunt; Colon polyps in her sister; Coronary artery disease (age of onset: 42) in her father; Diabetes in her mother; Heart disease in her father; Hypertension in her father, mother, and sister; Other in her father; Pancreatic cancer in her maternal aunt; Stroke in her paternal grandmother; Sudden death (age of onset: 64) in her mother. There is no history of Colon cancer, Gallbladder disease, or Esophageal cancer.   Review of Systems: Please see the history of present illness.   Otherwise, the review of systems is positive for none.   All other systems are reviewed and negative.   Physical Exam: VS:  BP 130/80 mmHg  Pulse 83  Temp(Src) 98.6 F (37 C)  Ht 5\' 2"  (1.575  m)  Wt 295 lb 12.8 oz (134.174 kg)  BMI 54.09 kg/m2  SpO2 96%  LMP 09/15/2013 .  BMI Body mass index is 54.09 kg/(m^2).  Wt Readings from Last 3 Encounters:  06/07/15 295 lb 12.8 oz (134.174 kg)  06/05/15 295 lb 3.1 oz (133.9 kg)  01/15/15 300 lb (136.079 kg)    General: Pleasant. Morbidly obese. She is alert and in no acute distress.  HEENT: Normal. Neck: Supple, no JVD, carotid bruits, or masses noted.  Cardiac: Regular rate and rhythm. Heart tones are distant.  No edema.  Respiratory:  Lungs are clear to auscultation bilaterally with normal work of breathing.  GI: Soft and nontender.  MS: No deformity or atrophy. Gait and ROM intact. Skin: Warm and dry. Color is normal.  Neuro:  Strength and sensation are intact and no gross focal deficits noted.  Psych: Alert, appropriate and  with normal affect. Right wrist cath site looks fine.    LABORATORY DATA:  EKG:  EKG is not ordered today.   Lab Results  Component Value Date   WBC 5.2 06/04/2015   HGB 12.2 06/04/2015   HCT 38.1 06/04/2015   PLT 272 06/04/2015   GLUCOSE 109* 06/05/2015   CHOL 177 09/17/2013   TRIG 98 09/17/2013   HDL 57 09/17/2013   LDLCALC 100* 09/17/2013   ALT 35 06/04/2015   AST 56* 06/04/2015   NA 139 06/05/2015   K 3.8 06/05/2015   CL 104 06/05/2015   CREATININE 1.07* 06/05/2015   BUN 9 06/05/2015   CO2 28 06/05/2015   TSH 1.185 09/17/2013   INR 1.07 03/08/2012   HGBA1C 6.5 05/13/2014    BNP (last 3 results) No results for input(s): BNP in the last 8760 hours.  ProBNP (last 3 results) No results for input(s): PROBNP in the last 8760 hours.   Other Studies Reviewed Today: Left Heart Cath and Coronary Angiography    Conclusion    1. Ost 1st Diag to 1st Diag lesion, 50% stenosed. 2. Ost LAD to Prox LAD lesion, 40% stenosed.   Widely patent very tortuous coronary arteries with proximal LAD and first diagonal calcification and 40% and 50% lesions respectively. No significant change  compared to prior.  Normal left ventricular systolic function and hemodynamics. EF at least 55%.  RECOMMENDATIONS:   Treat hypokalemia   Consider other diagnostic possibilities to explain the patient's chest discomfort.     Echo Study Conclusions from 09/2013  - Left ventricle: The cavity size was normal. Wall thickness was normal. The estimated ejection fraction was 55%. Wall motion was normal; there were no regional wall motion abnormalities. Findings consistent with left ventricular diastolic dysfunction. - Mitral valve: Mild regurgitation. - Right ventricle: The cavity size was normal. Systolic function was normal. - Pulmonary arteries: PA peak pressure: 64mm Hg (S).   Assessment/Plan: 1. Multitude of somatic complaints - do not suspect this is cardiac. No fever here today. WBC was not elevated. So far, no growth on her blood cultures. Needs to get to primary care. Will try to get her back to [facility] for evaluation. Encouraged her to get a thermometer if she can to monitor for fever.   2. Recent cardiac cath with non obstructive disease - would manage medically. Has been started on statin, beta blocker and aldactone. Will still need her follow up lab  3. HTN - BP ok on her current regimen.   4. HLD - now on statin.   Current medicines are reviewed with the patient today.  The patient does not have concerns regarding medicines other than what has been noted above.  The following changes have been made:  See above.  Labs/ tests ordered today include:   No orders of the defined types were placed in this encounter.     Disposition:   FU as planned.    Patient is agreeable to this plan and will call if any problems develop in the interim.   Signed: Burtis Junes, RN, ANP-C 06/07/2015 3:35 PM  [facility] [address] [place], [place]  [address] Phone: [phone] Fax: [phone]

## 2015-06-07 NOTE — Patient Instructions (Addendum)
We will be checking the following labs today - NONE   Medication Instructions:    Continue with your current medicines.     Testing/Procedures To Be Arranged:  N/A  Follow-Up:   See Cecilie Kicks, NP as planned to get labs done  See if we can get her an appointment at [facility] for follow up and further evaluation    Other Special Instructions:   Try to buy thermometer and monitor your temperature at home.     If you need a refill on your cardiac medications before your next appointment, please call your pharmacy.   Call the [place] office at [phone] if you have any questions, problems or concerns.

## 2015-06-07 NOTE — Telephone Encounter (Signed)
NewMessage  Pt stated she is not feeling any better from hospital stay and requests to speak w/ RN- pt stated she haslab work on 11/23, and appt w/ Mickel Baas on 12/6. Please call back and discuss.

## 2015-06-09 LAB — CULTURE, BLOOD (ROUTINE X 2)
Culture: NO GROWTH
Culture: NO GROWTH

## 2015-06-21 NOTE — Progress Notes (Signed)
This encounter was created in error - please disregard.

## 2015-06-22 ENCOUNTER — Encounter: Payer: BLUE CROSS/BLUE SHIELD | Admitting: Cardiology

## 2015-06-22 DIAGNOSIS — R0989 Other specified symptoms and signs involving the circulatory and respiratory systems: Secondary | ICD-10-CM

## 2015-06-23 ENCOUNTER — Encounter: Payer: Self-pay | Admitting: Cardiology

## 2015-08-09 ENCOUNTER — Emergency Department (INDEPENDENT_AMBULATORY_CARE_PROVIDER_SITE_OTHER)
Admission: EM | Admit: 2015-08-09 | Discharge: 2015-08-09 | Disposition: A | Discharge status: Nursing Home (Includes ICF) | Payer: BLUE CROSS/BLUE SHIELD | Source: Home / Self Care | Attending: Family Medicine | Admitting: Family Medicine

## 2015-08-09 ENCOUNTER — Emergency Department (HOSPITAL_COMMUNITY): Payer: BLUE CROSS/BLUE SHIELD

## 2015-08-09 ENCOUNTER — Emergency Department (INDEPENDENT_AMBULATORY_CARE_PROVIDER_SITE_OTHER): Payer: BLUE CROSS/BLUE SHIELD

## 2015-08-09 ENCOUNTER — Encounter (HOSPITAL_COMMUNITY): Payer: Self-pay | Admitting: *Deleted

## 2015-08-09 DIAGNOSIS — J4521 Mild intermittent asthma with (acute) exacerbation: Secondary | ICD-10-CM

## 2015-08-09 MED ORDER — METHYLPREDNISOLONE SODIUM SUCC 125 MG IJ SOLR
125.0000 mg | Freq: Once | INTRAMUSCULAR | Status: AC
  Administered 2015-08-09: 125 mg via INTRAMUSCULAR

## 2015-08-09 MED ORDER — ALBUTEROL SULFATE (2.5 MG/3ML) 0.083% IN NEBU
5.0000 mg | INHALATION_SOLUTION | Freq: Once | RESPIRATORY_TRACT | Status: AC
  Administered 2015-08-09: 5 mg via RESPIRATORY_TRACT

## 2015-08-09 MED ORDER — IPRATROPIUM BROMIDE 0.02 % IN SOLN
RESPIRATORY_TRACT | Status: AC
  Filled 2015-08-09: qty 2.5

## 2015-08-09 MED ORDER — IPRATROPIUM BROMIDE 0.02 % IN SOLN
0.5000 mg | Freq: Once | RESPIRATORY_TRACT | Status: AC
  Administered 2015-08-09: 0.5 mg via RESPIRATORY_TRACT

## 2015-08-09 MED ORDER — METHYLPREDNISOLONE SODIUM SUCC 125 MG IJ SOLR
INTRAMUSCULAR | Status: AC
  Filled 2015-08-09: qty 2

## 2015-08-09 MED ORDER — ALBUTEROL SULFATE (2.5 MG/3ML) 0.083% IN NEBU
INHALATION_SOLUTION | RESPIRATORY_TRACT | Status: AC
Start: 2015-08-09 — End: 2015-08-09
  Filled 2015-08-09: qty 6

## 2015-08-09 NOTE — ED Notes (Signed)
Pt  Reports  Cough   Tightness  In  Chest    With  Wheezing     And  Shortness of  Breath on  Exertion     Pt  Reports  Symptoms   Since  Last  Week

## 2015-08-09 NOTE — ED Provider Notes (Addendum)
CSN: [id number]     Arrival date & time 08/09/15  1355 History   First MD Initiated Contact with Patient 08/09/15 1623     Chief Complaint  Patient presents with  . Shortness of Breath   (Consider location/radiation/quality/duration/timing/severity/associated sxs/prior Treatment) Patient is a 55 y.o. female presenting with shortness of breath. The history is provided by the patient and the spouse.  Shortness of Breath Severity:  Moderate Onset quality:  Gradual Duration:  1 week Progression:  Worsening Chronicity:  New Relieved by:  None tried Worsened by:  Nothing tried Associated symptoms: cough and wheezing   Associated symptoms: no fever and no sputum production     Past Medical History  Diagnosis Date  . Hypertension   . GERD (gastroesophageal reflux disease)   . Anemia   . Sickle cell trait ([place])   . Morbid obesity with BMI of 50.0-59.9, adult ([place])   . Chest pain     a. 02/2012 abnl myoview - inferoapical reverisbility concerning for ischemia, EF 59%;   02/2012 nonobstructive cath  . Abnormal Pap smear of cervix     pt couldnt state what type of abnormality  . HLD (hyperlipidemia)   . Chronic migraine   . Prediabetes   . S/P cardiac catheterization, non obstructive disease 06/04/15 06/05/2015  . CAD in native artery 06/05/2015  . Hypokalemia 06/05/2015  . Metabolic syndrome [id number]   Past Surgical History  Procedure Laterality Date  . Tubal ligation    . Spine surgery      fusion  . Left heart catheterization with coronary angiogram N/A 03/08/2012    Procedure: LEFT HEART CATHETERIZATION WITH CORONARY ANGIOGRAM;  Surgeon: Hillary Bow, MD;  Location: [facility] CATH LAB;  Service: Cardiovascular;  Laterality: N/A;  . Back surgery      x1 lumbar fusion  . Cholecystectomy      laparoscopic  . Colonoscopy with propofol N/A 09/04/2014    Procedure: COLONOSCOPY WITH PROPOFOL;  Surgeon: [name], MD;  Location: [facility] ENDOSCOPY;  Service: Endoscopy;  Laterality: N/A;   . Cardiac catheterization N/A 06/04/2015    Procedure: Left Heart Cath and Coronary Angiography;  Surgeon: [name], MD;  Location: [place] CV LAB;  Service: Cardiovascular;  Laterality: N/A;   Family History  Problem Relation Age of Onset  . Coronary artery disease Father 34  . Heart disease Father   . Other Father     died in a MVA  . Hypertension Father   . Sudden death Mother 41    Questionable MI  . Diabetes Mother   . Hypertension Mother   . Hypertension Sister   . Pancreatic cancer Maternal Aunt   . Stroke Paternal Grandmother   . Breast cancer Maternal Aunt     after age 22  . Colon cancer Neg Hx   . Colon polyps Sister   . Gallbladder disease Neg Hx   . Esophageal cancer Neg Hx    Social History  Substance Use Topics  . Smoking status: Never Smoker   . Smokeless tobacco: Never Used  . Alcohol Use: No   OB History    Gravida Para Term Preterm AB TAB SAB Ectopic Multiple Living   5 3   2 2    3      Review of Systems  Constitutional: Positive for activity change. Negative for fever.       Diff with a flight of stairs, prolems sleeping from cough and sob.  Respiratory: Positive for cough, chest  tightness, shortness of breath and wheezing. Negative for sputum production.   Cardiovascular: Negative.   All other systems reviewed and are negative.   Allergies  Review of patient's allergies indicates no known allergies.  Home Medications   Prior to Admission medications   Medication Sig Start Date End Date Taking? Authorizing Provider  acetaminophen (TYLENOL) 500 MG tablet Take 1,000 mg by mouth every 8 (eight) hours as needed (pain).    Historical Provider, MD  aspirin EC 81 MG EC tablet Take 1 tablet (81 mg total) by mouth daily. 06/05/15   Isaiah Serge, NP  atorvastatin (LIPITOR) 40 MG tablet Take 1 tablet (40 mg total) by mouth daily at 6 PM. 06/05/15   Isaiah Serge, NP  carvedilol (COREG) 6.25 MG tablet Take 0.5 tablets (3.125 mg total) by  mouth 2 (two) times daily with a meal. 06/05/15   Isaiah Serge, NP  meloxicam (MOBIC) 7.5 MG tablet Take 1 tablet (7.5 mg total) by mouth daily. 0000000   Delora Fuel, MD  spironolactone (ALDACTONE) 25 MG tablet Take 1 tablet (25 mg total) by mouth daily. 06/05/15   Isaiah Serge, NP   Meds Ordered and Administered this Visit   Medications  methylPREDNISolone sodium succinate (SOLU-MEDROL) 125 mg/2 mL injection 125 mg (125 mg Intramuscular Given 08/09/15 1725)  albuterol (PROVENTIL) (2.5 MG/3ML) 0.083% nebulizer solution 5 mg (5 mg Nebulization Given 08/09/15 1648)  ipratropium (ATROVENT) nebulizer solution 0.5 mg (0.5 mg Nebulization Given 08/09/15 1648)    BP 176/90 mmHg  Pulse 80  Temp(Src) 98.4 F (36.9 C) (Oral)  Resp 16  SpO2 95%  LMP 09/15/2013 No data found.   Physical Exam  Constitutional: She is oriented to person, place, and time. She appears well-developed and well-nourished. She appears distressed.  HENT:  Head: Normocephalic.  Right Ear: External ear normal.  Left Ear: External ear normal.  Mouth/Throat: Oropharynx is clear and moist.  Eyes: Pupils are equal, round, and reactive to light.  Neck: Normal range of motion. Neck supple.  Cardiovascular: Regular rhythm and normal heart sounds.   Pulmonary/Chest: She has wheezes. She has no rales.  Lymphadenopathy:    She has no cervical adenopathy.  Neurological: She is alert and oriented to person, place, and time.  Skin: Skin is warm and dry.  Nursing note and vitals reviewed.   ED Course  Procedures (including critical care time)  Labs Review Labs Reviewed - No data to display  Imaging Review Dg Chest 2 View  08/09/2015  CLINICAL DATA:  Cough for 1 week. EXAM: CHEST  2 VIEW COMPARISON:  06/04/2015 FINDINGS: Mildly tortuous thoracic aorta. Thoracic spondylosis. Mild degenerative AC joint arthropathy with subcortical cysts in both distal clavicles. The lungs appear clear.  No pleural effusion. IMPRESSION: 1.   No active cardiopulmonary disease is radiographically apparent. 2. Mildly tortuous thoracic aorta. 3. Thoracic spondylosis. Electronically Signed   By: Van Clines M.D.   On: 08/09/2015 17:19   X-rays reviewed and report per radiologist.   Visual Acuity Review  Right Eye Distance:   Left Eye Distance:   Bilateral Distance:    Right Eye Near:   Left Eye Near:    Bilateral Near:         MDM   1. Asthma exacerbation attacks, mild intermittent    Sx sl improved after neb, sent for further eval, cxr nad. Wheezing and cough continue. Meds ordered this encounter  Medications  . methylPREDNISolone sodium succinate (SOLU-MEDROL) 125 mg/2 mL injection 125  mg    Sig:   . albuterol (PROVENTIL) (2.5 MG/3ML) 0.083% nebulizer solution 5 mg    Sig:   . ipratropium (ATROVENT) nebulizer solution 0.5 mg    Sig:       Billy Fischer, MD 08/09/15 1746  Billy Fischer, MD 08/09/15 [id number]

## 2016-02-20 ENCOUNTER — Emergency Department (HOSPITAL_COMMUNITY)
Admission: EM | Admit: 2016-02-20 | Discharge: 2016-02-20 | Disposition: A | Discharge status: Home / Self Care | Payer: BLUE CROSS/BLUE SHIELD | Attending: Physician Assistant | Admitting: Physician Assistant

## 2016-02-20 ENCOUNTER — Encounter (HOSPITAL_COMMUNITY): Payer: Self-pay | Admitting: *Deleted

## 2016-02-20 DIAGNOSIS — R51 Headache: Secondary | ICD-10-CM | POA: Insufficient documentation

## 2016-02-20 DIAGNOSIS — I2510 Atherosclerotic heart disease of native coronary artery without angina pectoris: Secondary | ICD-10-CM | POA: Insufficient documentation

## 2016-02-20 DIAGNOSIS — R519 Headache, unspecified: Secondary | ICD-10-CM

## 2016-02-20 DIAGNOSIS — Z7982 Long term (current) use of aspirin: Secondary | ICD-10-CM | POA: Insufficient documentation

## 2016-02-20 DIAGNOSIS — Z79899 Other long term (current) drug therapy: Secondary | ICD-10-CM | POA: Insufficient documentation

## 2016-02-20 DIAGNOSIS — I10 Essential (primary) hypertension: Secondary | ICD-10-CM | POA: Insufficient documentation

## 2016-02-20 DIAGNOSIS — Z9889 Other specified postprocedural states: Secondary | ICD-10-CM | POA: Insufficient documentation

## 2016-02-20 DIAGNOSIS — Z6841 Body Mass Index (BMI) 40.0 and over, adult: Secondary | ICD-10-CM | POA: Insufficient documentation

## 2016-02-20 LAB — I-STAT CHEM 8, ED
BUN: 10 mg/dL (ref 6–20)
Calcium, Ion: 1.05 mmol/L — ABNORMAL LOW (ref 1.13–1.30)
Chloride: 106 mmol/L (ref 101–111)
Creatinine, Ser: 0.8 mg/dL (ref 0.44–1.00)
Glucose, Bld: 108 mg/dL — ABNORMAL HIGH (ref 65–99)
HCT: 32 % — ABNORMAL LOW (ref 36.0–46.0)
Hemoglobin: 10.9 g/dL — ABNORMAL LOW (ref 12.0–15.0)
Potassium: 3 mmol/L — ABNORMAL LOW (ref 3.5–5.1)
Sodium: 143 mmol/L (ref 135–145)
TCO2: 26 mmol/L (ref 0–100)

## 2016-02-20 MED ORDER — SODIUM CHLORIDE 0.9 % IV BOLUS (SEPSIS)
1000.0000 mL | Freq: Once | INTRAVENOUS | Status: AC
Start: 2016-02-20 — End: 2016-02-20
  Administered 2016-02-20: 1000 mL via INTRAVENOUS

## 2016-02-20 MED ORDER — VALPROATE SODIUM 500 MG/5ML IV SOLN
500.0000 mg | Freq: Once | INTRAVENOUS | Status: AC
  Administered 2016-02-20: 500 mg via INTRAVENOUS
  Filled 2016-02-20: qty 5

## 2016-02-20 MED ORDER — POTASSIUM CHLORIDE CRYS ER 20 MEQ PO TBCR
40.0000 meq | EXTENDED_RELEASE_TABLET | Freq: Once | ORAL | Status: AC
  Administered 2016-02-20: 40 meq via ORAL
  Filled 2016-02-20: qty 2

## 2016-02-20 MED ORDER — METOCLOPRAMIDE HCL 10 MG PO TABS
10.0000 mg | ORAL_TABLET | Freq: Once | ORAL | Status: AC
  Administered 2016-02-20: 10 mg via ORAL
  Filled 2016-02-20: qty 1

## 2016-02-20 MED ORDER — DIPHENHYDRAMINE HCL 25 MG PO CAPS
50.0000 mg | ORAL_CAPSULE | Freq: Once | ORAL | Status: AC
  Administered 2016-02-20: 50 mg via ORAL
  Filled 2016-02-20: qty 2

## 2016-02-20 MED ORDER — KETOROLAC TROMETHAMINE 60 MG/2ML IM SOLN
60.0000 mg | Freq: Once | INTRAMUSCULAR | Status: AC
  Administered 2016-02-20: 60 mg via INTRAMUSCULAR
  Filled 2016-02-20: qty 2

## 2016-02-20 NOTE — Discharge Instructions (Signed)
Please follow-up with your doctor or the [facility] to establish primary care for follow-up. Continue with Motrin and Tylenol for headaches. Return to ED for new or worsening symptoms as we discussed.

## 2016-02-20 NOTE — ED Triage Notes (Signed)
Pt complains of dizziness, headache, lightheadedness for the past week. Pt states her right toe and right middle finger has felt numb for a month.

## 2016-02-20 NOTE — ED Notes (Signed)
We have just received the med. From pharmacy. Apparently it had been tubed to another floor; and was subsequently sent to [place].  Pt. Is very understanding.

## 2016-02-20 NOTE — ED Provider Notes (Signed)
[facility] DEPT Provider Note   CSN: [id number] Arrival date & time: 02/20/16  [id number]  First Provider Contact:  None       History   Chief Complaint Chief Complaint  Patient presents with  . Dizziness  . Migraine    HPI Hannah Huerta is a 55 y.o. female.  HPI history of coronary artery disease, chronic migraine, hypertension, morbid obesity, here for evaluation of migraine and dizziness. Patient reports typical presentation of her migraine, ongoing for the past one month. Reports associated nausea and dizziness. Mild photophobia, no phonophobia. She has not tried anything to improve her symptoms. Denies fevers, chills, chest pain or short of breath, Unusual leg swelling. She does report paresthesias in her right distal middle finger as well as her right great toe. No other focal numbness or weakness. No other modifying factors.  Past Medical History:  Diagnosis Date  . Abnormal Pap smear of cervix    pt couldnt state what type of abnormality  . Anemia   . CAD in native artery 06/05/2015  . Chest pain    a. 02/2012 abnl myoview - inferoapical reverisbility concerning for ischemia, EF 59%;   02/2012 nonobstructive cath  . Chronic migraine   . GERD (gastroesophageal reflux disease)   . HLD (hyperlipidemia)   . Hypertension   . Hypokalemia 06/05/2015  . Metabolic syndrome [id number]  . Morbid obesity with BMI of 50.0-59.9, adult ([place])   . Prediabetes   . S/P cardiac catheterization, non obstructive disease 06/04/15 06/05/2015  . Sickle cell trait [facility])     Patient Active Problem List   Diagnosis Date Noted  . S/P cardiac catheterization, non obstructive disease 06/04/15 06/05/2015  . CAD in native artery 06/05/2015  . Fever 06/05/2015  . Hypokalemia 06/05/2015  . Metabolic syndrome [id number]  . Chest pain, neg MI, possible GI 06/04/2015  . Colon cancer screening 08/05/2014  . Severe obesity (BMI >= 40) ([place]) 08/05/2014  . Health care maintenance 05/13/2014  .  Prediabetes 05/13/2014  . Chronic migraine 04/27/2014  . Atypical chest pain 04/27/2014  . SOB (shortness of breath) 09/16/2013  . Palpitations 09/16/2013  . HTN (hypertension) 03/09/2012  . Hyperlipidemia 03/09/2012  . Microcytic anemia   . Morbid obesity with BMI of 50.0-59.9, adult ([place])   . Precordial pain 03/06/2012  . Dizziness 03/06/2012    Past Surgical History:  Procedure Laterality Date  . BACK SURGERY     x1 lumbar fusion  . CARDIAC CATHETERIZATION N/A 06/04/2015   Procedure: Left Heart Cath and Coronary Angiography;  Surgeon: [name], MD;  Location: [facility] CV LAB;  Service: Cardiovascular;  Laterality: N/A;  . CHOLECYSTECTOMY     laparoscopic  . COLONOSCOPY WITH PROPOFOL N/A 09/04/2014   Procedure: COLONOSCOPY WITH PROPOFOL;  Surgeon: [name], MD;  Location: [facility] ENDOSCOPY;  Service: Endoscopy;  Laterality: N/A;  . LEFT HEART CATHETERIZATION WITH CORONARY ANGIOGRAM N/A 03/08/2012   Procedure: LEFT HEART CATHETERIZATION WITH CORONARY ANGIOGRAM;  Surgeon: Hillary Bow, MD;  Location: [facility] CATH LAB;  Service: Cardiovascular;  Laterality: N/A;  . SPINE SURGERY     fusion  . TUBAL LIGATION      OB History    Gravida Para Term Preterm AB Living   5 3     2 3    SAB TAB Ectopic Multiple Live Births     2             Home Medications    Prior to Admission medications  Medication Sig Start Date End Date Taking? Authorizing Provider  acetaminophen (TYLENOL) 500 MG tablet Take 1,000 mg by mouth every 8 (eight) hours as needed (pain).   Yes Historical Provider, MD  atorvastatin (LIPITOR) 40 MG tablet Take 1 tablet (40 mg total) by mouth daily at 6 PM. 06/05/15  Yes Isaiah Serge, NP  carvedilol (COREG) 6.25 MG tablet Take 0.5 tablets (3.125 mg total) by mouth 2 (two) times daily with a meal. 06/05/15  Yes Isaiah Serge, NP  meloxicam (MOBIC) 7.5 MG tablet Take 1 tablet (7.5 mg total) by mouth daily. 0000000  Yes Delora Fuel, MD  spironolactone  (ALDACTONE) 25 MG tablet Take 1 tablet (25 mg total) by mouth daily. 06/05/15  Yes Isaiah Serge, NP  aspirin EC 81 MG EC tablet Take 1 tablet (81 mg total) by mouth daily. Patient not taking: Reported on 02/20/2016 06/05/15   Isaiah Serge, NP    Family History Family History  Problem Relation Age of Onset  . Coronary artery disease Father 65  . Heart disease Father   . Other Father     died in a MVA  . Hypertension Father   . Sudden death Mother 42    Questionable MI  . Diabetes Mother   . Hypertension Mother   . Hypertension Sister   . Pancreatic cancer Maternal Aunt   . Breast cancer Maternal Aunt     after age 29  . Stroke Paternal Grandmother   . Colon polyps Sister   . Colon cancer Neg Hx   . Gallbladder disease Neg Hx   . Esophageal cancer Neg Hx     Social History Social History  Substance Use Topics  . Smoking status: Never Smoker  . Smokeless tobacco: Never Used  . Alcohol use No     Allergies   Review of patient's allergies indicates no known allergies.   Review of Systems Review of Systems A 10 point review of systems was completed and was negative except for pertinent positives and negatives as mentioned in the history of present illness    Physical Exam Updated Vital Signs BP 161/80 (BP Location: Right Arm)   Pulse 60   Temp 98.6 F (37 C) (Oral)   Resp 16   Ht 5\' 2"  (1.575 m)   Wt 127 kg   LMP 09/15/2013   SpO2 97%   BMI 51.21 kg/m   Physical Exam  Constitutional: She is oriented to person, place, and time. She appears well-developed and well-nourished. No distress.  Morbidly obese  HENT:  Head: Normocephalic and atraumatic.  Mouth/Throat: Oropharynx is clear and moist.  Eyes: Conjunctivae and EOM are normal. Pupils are equal, round, and reactive to light.  Neck: Normal range of motion.  No meningismus or nuchal rigidity  Cardiovascular: Normal rate, regular rhythm and normal heart sounds.   Pulmonary/Chest: Effort normal and  breath sounds normal.  Abdominal: Soft. There is no tenderness.  Musculoskeletal: Normal range of motion. She exhibits no edema or tenderness.  Neurological: She is alert and oriented to person, place, and time.  Cranial nerves III through XII grossly intact. Motor strength is 5/5 in all 4 extremities and sensation is intact to light touch. Completes finger to nose coordination movements without difficulty. No nystagmus. Gait is baseline without ataxia.  Skin: No rash noted. She is not diaphoretic.  Psychiatric: She has a normal mood and affect. Her behavior is normal. Judgment and thought content normal.  Nursing note and vitals reviewed.  ED Treatments / Results  Labs (all labs ordered are listed, but only abnormal results are displayed) Labs Reviewed  I-STAT CHEM 8, ED - Abnormal; Notable for the following:       Result Value   Potassium 3.0 (*)    Glucose, Bld 108 (*)    Calcium, Ion 1.05 (*)    Hemoglobin 10.9 (*)    HCT 32.0 (*)    All other components within normal limits  I-STAT CHEM 8, ED    EKG  EKG Interpretation None       Radiology No results found.  Procedures Procedures (including critical care time)  Medications Ordered in ED Medications  potassium chloride SA (K-DUR,KLOR-CON) CR tablet 40 mEq (not administered)  ketorolac (TORADOL) injection 60 mg (60 mg Intramuscular Given 02/20/16 1208)  metoCLOPramide (REGLAN) tablet 10 mg (10 mg Oral Given 02/20/16 1208)  diphenhydrAMINE (BENADRYL) capsule 50 mg (50 mg Oral Given 02/20/16 1208)  sodium chloride 0.9 % bolus 1,000 mL (1,000 mLs Intravenous New Bag/Given 02/20/16 1547)  valproate (DEPACON) 500 mg in dextrose 5 % 50 mL IVPB (500 mg Intravenous New Bag/Given 02/20/16 1547)     Initial Impression / Assessment and Plan / ED Course  I have reviewed the triage vital signs and the nursing notes.  Pertinent labs & imaging results that were available during my care of the patient were reviewed by me and  considered in my medical decision making (see chart for details).  Clinical Course    Patient with headache consistent with previous headache pattern. Nonfocal neuro exam, afebrile and hemodynamically stable. Mild paresthesias in isolated digits likely secondary to diabetic peripheral neuropathy. Ambulates w/o difficulty. No concern for ICH, meningitis or other emergent intracranial pathology. Discomfort resolves with migraine cocktail. She does have a mildly low potassium, 3.0, oral repletion in ED. She was given follow-up with [facility] and urged to follow up there or with her own PCP. She overall appears well, nontoxic, hemodynamically stable and appropriate for discharge. Prior to patient discharge, I discussed and reviewed this case with Dr. Thomasene Lot     Final Clinical Impressions(s) / ED Diagnoses   Final diagnoses:  Bad headache    New Prescriptions New Prescriptions   No medications on file     Comer Locket, PA-C 02/20/16 [place], MD 02/20/16 1812

## 2016-04-21 ENCOUNTER — Encounter (HOSPITAL_COMMUNITY): Payer: Self-pay | Admitting: Vascular Surgery

## 2016-04-21 ENCOUNTER — Emergency Department (HOSPITAL_COMMUNITY)
Admission: EM | Admit: 2016-04-21 | Discharge: 2016-04-21 | Disposition: A | Discharge status: Home / Self Care | Payer: BLUE CROSS/BLUE SHIELD | Attending: Dermatology | Admitting: Dermatology

## 2016-04-21 DIAGNOSIS — I2510 Atherosclerotic heart disease of native coronary artery without angina pectoris: Secondary | ICD-10-CM | POA: Insufficient documentation

## 2016-04-21 DIAGNOSIS — Z7982 Long term (current) use of aspirin: Secondary | ICD-10-CM | POA: Insufficient documentation

## 2016-04-21 DIAGNOSIS — I10 Essential (primary) hypertension: Secondary | ICD-10-CM | POA: Insufficient documentation

## 2016-04-21 DIAGNOSIS — R109 Unspecified abdominal pain: Secondary | ICD-10-CM | POA: Insufficient documentation

## 2016-04-21 DIAGNOSIS — Z5321 Procedure and treatment not carried out due to patient leaving prior to being seen by health care provider: Secondary | ICD-10-CM | POA: Insufficient documentation

## 2016-04-21 LAB — CBC
HCT: 38.1 % (ref 36.0–46.0)
Hemoglobin: 12.3 g/dL (ref 12.0–15.0)
MCH: 25.7 pg — ABNORMAL LOW (ref 26.0–34.0)
MCHC: 32.3 g/dL (ref 30.0–36.0)
MCV: 79.7 fL (ref 78.0–100.0)
Platelets: 317 10*3/uL (ref 150–400)
RBC: 4.78 MIL/uL (ref 3.87–5.11)
RDW: 14.3 % (ref 11.5–15.5)
WBC: 4.8 10*3/uL (ref 4.0–10.5)

## 2016-04-21 LAB — BASIC METABOLIC PANEL
Anion gap: 8 (ref 5–15)
BUN: 12 mg/dL (ref 6–20)
CO2: 29 mmol/L (ref 22–32)
Calcium: 9.3 mg/dL (ref 8.9–10.3)
Chloride: 101 mmol/L (ref 101–111)
Creatinine, Ser: 1.22 mg/dL — ABNORMAL HIGH (ref 0.44–1.00)
GFR calc Af Amer: 57 mL/min — ABNORMAL LOW (ref >60)
GFR calc non Af Amer: 49 mL/min — ABNORMAL LOW (ref >60)
Glucose, Bld: 91 mg/dL (ref 65–99)
Potassium: 4 mmol/L (ref 3.5–5.1)
Sodium: 138 mmol/L (ref 135–145)

## 2016-04-21 NOTE — ED Notes (Signed)
Pt's husband came to desk.  Sts they received a call from their daughter and have to leave due to an emergency.  This Rn encouraged pt to consider staying, but patient states "I believe in the power of prayer and God will heal me.  If he doesn't, I'll be back".  This RN reviewed return precautions.

## 2016-04-21 NOTE — ED Triage Notes (Signed)
Pt reports to the ED for eval of left flank pain and BLE pain. She also reports the great toe on her right foot has decreased ROM and weakness as well. Pt reports the toe pain/weakness has been going on x 3 months. She also reports that at night her legs shake and are restless x 3 months as well. Pt reports she has a fatty cyst to that left side and they said they could not do anything for it but now she said the pain is more severe.

## 2016-05-22 ENCOUNTER — Encounter (HOSPITAL_COMMUNITY): Payer: Self-pay | Admitting: Emergency Medicine

## 2016-05-22 ENCOUNTER — Emergency Department (HOSPITAL_COMMUNITY)
Admission: EM | Admit: 2016-05-22 | Discharge: 2016-05-23 | Disposition: A | Discharge status: Home / Self Care | Payer: BLUE CROSS/BLUE SHIELD | Attending: Emergency Medicine | Admitting: Emergency Medicine

## 2016-05-22 DIAGNOSIS — I2510 Atherosclerotic heart disease of native coronary artery without angina pectoris: Secondary | ICD-10-CM | POA: Insufficient documentation

## 2016-05-22 DIAGNOSIS — R51 Headache: Secondary | ICD-10-CM | POA: Insufficient documentation

## 2016-05-22 DIAGNOSIS — Z7982 Long term (current) use of aspirin: Secondary | ICD-10-CM | POA: Insufficient documentation

## 2016-05-22 DIAGNOSIS — I10 Essential (primary) hypertension: Secondary | ICD-10-CM | POA: Insufficient documentation

## 2016-05-22 DIAGNOSIS — Z5321 Procedure and treatment not carried out due to patient leaving prior to being seen by health care provider: Secondary | ICD-10-CM | POA: Insufficient documentation

## 2016-05-22 LAB — COMPREHENSIVE METABOLIC PANEL
ALT: 14 U/L (ref 14–54)
AST: 24 U/L (ref 15–41)
Albumin: 3.2 g/dL — ABNORMAL LOW (ref 3.5–5.0)
Alkaline Phosphatase: 77 U/L (ref 38–126)
Anion gap: 8 (ref 5–15)
BUN: 12 mg/dL (ref 6–20)
CO2: 26 mmol/L (ref 22–32)
Calcium: 9.1 mg/dL (ref 8.9–10.3)
Chloride: 103 mmol/L (ref 101–111)
Creatinine, Ser: 1.22 mg/dL — ABNORMAL HIGH (ref 0.44–1.00)
GFR calc Af Amer: 57 mL/min — ABNORMAL LOW (ref >60)
GFR calc non Af Amer: 49 mL/min — ABNORMAL LOW (ref >60)
Glucose, Bld: 113 mg/dL — ABNORMAL HIGH (ref 65–99)
Potassium: 3.5 mmol/L (ref 3.5–5.1)
Sodium: 137 mmol/L (ref 135–145)
Total Bilirubin: 0.2 mg/dL — ABNORMAL LOW (ref 0.3–1.2)
Total Protein: 7.3 g/dL (ref 6.5–8.1)

## 2016-05-22 LAB — URINALYSIS, ROUTINE W REFLEX MICROSCOPIC
Bilirubin Urine: NEGATIVE
Glucose, UA: NEGATIVE mg/dL
Hgb urine dipstick: NEGATIVE
Ketones, ur: NEGATIVE mg/dL
Leukocytes, UA: NEGATIVE
Nitrite: NEGATIVE
Protein, ur: NEGATIVE mg/dL
Specific Gravity, Urine: 1.013 (ref 1.005–1.030)
pH: 5.5 (ref 5.0–8.0)

## 2016-05-22 LAB — CBC WITH DIFFERENTIAL/PLATELET
Basophils Absolute: 0 10*3/uL (ref 0.0–0.1)
Basophils Relative: 0 %
Eosinophils Absolute: 0.2 10*3/uL (ref 0.0–0.7)
Eosinophils Relative: 3 %
HCT: 34.1 % — ABNORMAL LOW (ref 36.0–46.0)
Hemoglobin: 11.2 g/dL — ABNORMAL LOW (ref 12.0–15.0)
Lymphocytes Relative: 44 %
Lymphs Abs: 2.5 10*3/uL (ref 0.7–4.0)
MCH: 25.5 pg — ABNORMAL LOW (ref 26.0–34.0)
MCHC: 32.8 g/dL (ref 30.0–36.0)
MCV: 77.7 fL — ABNORMAL LOW (ref 78.0–100.0)
Monocytes Absolute: 0.6 10*3/uL (ref 0.1–1.0)
Monocytes Relative: 11 %
Neutro Abs: 2.4 10*3/uL (ref 1.7–7.7)
Neutrophils Relative %: 42 %
Platelets: 293 10*3/uL (ref 150–400)
RBC: 4.39 MIL/uL (ref 3.87–5.11)
RDW: 14.2 % (ref 11.5–15.5)
WBC: 5.7 10*3/uL (ref 4.0–10.5)

## 2016-05-22 NOTE — ED Triage Notes (Signed)
Pt c/o headache with nausea, onset earlier today.  Hx of migraines.  Pt \\left  side of face feels numb.  Also numbness in entire right side with pain in lower back.  St's sometimes when she walks she feels like her legs are gonna give out, onset 1 month ago

## 2016-07-23 ENCOUNTER — Emergency Department (HOSPITAL_COMMUNITY): Payer: No Typology Code available for payment source

## 2016-07-23 ENCOUNTER — Encounter (HOSPITAL_COMMUNITY): Payer: Self-pay | Admitting: Emergency Medicine

## 2016-07-23 ENCOUNTER — Observation Stay (HOSPITAL_COMMUNITY)
Admission: EM | Admit: 2016-07-23 | Discharge: 2016-07-25 | Disposition: A | Discharge status: Home / Self Care | Payer: No Typology Code available for payment source | Attending: Internal Medicine | Admitting: Internal Medicine

## 2016-07-23 DIAGNOSIS — E785 Hyperlipidemia, unspecified: Secondary | ICD-10-CM | POA: Diagnosis not present

## 2016-07-23 DIAGNOSIS — K219 Gastro-esophageal reflux disease without esophagitis: Secondary | ICD-10-CM | POA: Diagnosis not present

## 2016-07-23 DIAGNOSIS — I509 Heart failure, unspecified: Secondary | ICD-10-CM | POA: Diagnosis not present

## 2016-07-23 DIAGNOSIS — R55 Syncope and collapse: Secondary | ICD-10-CM | POA: Diagnosis present

## 2016-07-23 DIAGNOSIS — D573 Sickle-cell trait: Secondary | ICD-10-CM | POA: Insufficient documentation

## 2016-07-23 DIAGNOSIS — I2510 Atherosclerotic heart disease of native coronary artery without angina pectoris: Secondary | ICD-10-CM | POA: Diagnosis not present

## 2016-07-23 DIAGNOSIS — E8881 Metabolic syndrome: Secondary | ICD-10-CM | POA: Insufficient documentation

## 2016-07-23 DIAGNOSIS — Z9114 Patient's other noncompliance with medication regimen: Secondary | ICD-10-CM | POA: Diagnosis not present

## 2016-07-23 DIAGNOSIS — I110 Hypertensive heart disease with heart failure: Secondary | ICD-10-CM | POA: Diagnosis not present

## 2016-07-23 DIAGNOSIS — Z6841 Body Mass Index (BMI) 40.0 and over, adult: Secondary | ICD-10-CM | POA: Insufficient documentation

## 2016-07-23 DIAGNOSIS — I10 Essential (primary) hypertension: Secondary | ICD-10-CM | POA: Diagnosis present

## 2016-07-23 HISTORY — DX: Heart failure, unspecified: I50.9

## 2016-07-23 HISTORY — DX: Syncope and collapse: R55

## 2016-07-23 LAB — CBC WITH DIFFERENTIAL/PLATELET
Basophils Absolute: 0 10*3/uL (ref 0.0–0.1)
Basophils Relative: 0 %
Eosinophils Absolute: 0.1 10*3/uL (ref 0.0–0.7)
Eosinophils Relative: 2 %
HCT: 35.6 % — ABNORMAL LOW (ref 36.0–46.0)
Hemoglobin: 11.8 g/dL — ABNORMAL LOW (ref 12.0–15.0)
Lymphocytes Relative: 30 %
Lymphs Abs: 1.7 10*3/uL (ref 0.7–4.0)
MCH: 25.7 pg — ABNORMAL LOW (ref 26.0–34.0)
MCHC: 33.1 g/dL (ref 30.0–36.0)
MCV: 77.4 fL — ABNORMAL LOW (ref 78.0–100.0)
Monocytes Absolute: 0.6 10*3/uL (ref 0.1–1.0)
Monocytes Relative: 10 %
Neutro Abs: 3.3 10*3/uL (ref 1.7–7.7)
Neutrophils Relative %: 58 %
Platelets: 285 10*3/uL (ref 150–400)
RBC: 4.6 MIL/uL (ref 3.87–5.11)
RDW: 14.2 % (ref 11.5–15.5)
WBC: 5.7 10*3/uL (ref 4.0–10.5)

## 2016-07-23 LAB — URINALYSIS, ROUTINE W REFLEX MICROSCOPIC
Bilirubin Urine: NEGATIVE
Glucose, UA: NEGATIVE mg/dL
Hgb urine dipstick: NEGATIVE
Ketones, ur: NEGATIVE mg/dL
Leukocytes, UA: NEGATIVE
Nitrite: NEGATIVE
Protein, ur: NEGATIVE mg/dL
Specific Gravity, Urine: 1.01 (ref 1.005–1.030)
pH: 5 (ref 5.0–8.0)

## 2016-07-23 LAB — TROPONIN I
Troponin I: <0.03 ng/mL (ref <0.03)
Troponin I: <0.03 ng/mL (ref <0.03)

## 2016-07-23 LAB — COMPREHENSIVE METABOLIC PANEL
ALT: 16 U/L (ref 14–54)
AST: 27 U/L (ref 15–41)
Albumin: 3.5 g/dL (ref 3.5–5.0)
Alkaline Phosphatase: 72 U/L (ref 38–126)
Anion gap: 8 (ref 5–15)
BUN: 14 mg/dL (ref 6–20)
CO2: 27 mmol/L (ref 22–32)
Calcium: 9.4 mg/dL (ref 8.9–10.3)
Chloride: 105 mmol/L (ref 101–111)
Creatinine, Ser: 0.99 mg/dL (ref 0.44–1.00)
GFR calc Af Amer: >60 mL/min (ref >60)
GFR calc non Af Amer: >60 mL/min (ref >60)
Glucose, Bld: 104 mg/dL — ABNORMAL HIGH (ref 65–99)
Potassium: 4.5 mmol/L (ref 3.5–5.1)
Sodium: 140 mmol/L (ref 135–145)
Total Bilirubin: 0.4 mg/dL (ref 0.3–1.2)
Total Protein: 7.4 g/dL (ref 6.5–8.1)

## 2016-07-23 LAB — BRAIN NATRIURETIC PEPTIDE: B Natriuretic Peptide: 23.8 pg/mL (ref 0.0–100.0)

## 2016-07-23 LAB — D-DIMER, QUANTITATIVE (NOT AT ARMC): D-Dimer, Quant: 0.94 ug/mL-FEU — ABNORMAL HIGH (ref 0.00–0.50)

## 2016-07-23 LAB — CBG MONITORING, ED: Glucose-Capillary: 107 mg/dL — ABNORMAL HIGH (ref 65–99)

## 2016-07-23 LAB — I-STAT TROPONIN, ED: Troponin i, poc: 0 ng/mL (ref 0.00–0.08)

## 2016-07-23 MED ORDER — ACETAMINOPHEN 650 MG RE SUPP: 650.0000 mg | Freq: Four times a day (QID) | RECTAL | Status: DC | PRN

## 2016-07-23 MED ORDER — CYCLOBENZAPRINE HCL 5 MG PO TABS
5.0000 mg | ORAL_TABLET | Freq: Three times a day (TID) | ORAL | Status: DC | PRN
  Administered 2016-07-23: 5 mg via ORAL
  Filled 2016-07-23: qty 1

## 2016-07-23 MED ORDER — ONDANSETRON HCL 4 MG PO TABS: 4.0000 mg | ORAL_TABLET | Freq: Four times a day (QID) | ORAL | Status: DC | PRN

## 2016-07-23 MED ORDER — HYDROCODONE-ACETAMINOPHEN 5-325 MG PO TABS
1.0000 | ORAL_TABLET | ORAL | Status: DC | PRN
Start: 2016-07-23 — End: 2016-07-25
  Administered 2016-07-23: 1 via ORAL
  Filled 2016-07-23: qty 1

## 2016-07-23 MED ORDER — SPIRONOLACTONE 25 MG PO TABS
25.0000 mg | ORAL_TABLET | Freq: Every day | ORAL | Status: DC
  Administered 2016-07-23 – 2016-07-25 (×3): 25 mg via ORAL
  Filled 2016-07-23 (×3): qty 1

## 2016-07-23 MED ORDER — TRAZODONE HCL 50 MG PO TABS: 25.0000 mg | ORAL_TABLET | Freq: Every evening | ORAL | Status: DC | PRN

## 2016-07-23 MED ORDER — CARVEDILOL 6.25 MG PO TABS
6.2500 mg | ORAL_TABLET | Freq: Two times a day (BID) | ORAL | Status: DC
  Administered 2016-07-23 – 2016-07-25 (×5): 6.25 mg via ORAL
  Filled 2016-07-23 (×5): qty 1

## 2016-07-23 MED ORDER — ACETAMINOPHEN 325 MG PO TABS
650.0000 mg | ORAL_TABLET | Freq: Four times a day (QID) | ORAL | Status: DC | PRN
  Administered 2016-07-23: 650 mg via ORAL
  Filled 2016-07-23: qty 2

## 2016-07-23 MED ORDER — ONDANSETRON HCL 4 MG/2ML IJ SOLN: 4.0000 mg | Freq: Four times a day (QID) | INTRAMUSCULAR | Status: DC | PRN

## 2016-07-23 MED ORDER — ATORVASTATIN CALCIUM 40 MG PO TABS
40.0000 mg | ORAL_TABLET | Freq: Every day | ORAL | Status: DC
  Administered 2016-07-23 – 2016-07-25 (×3): 40 mg via ORAL
  Filled 2016-07-23 (×3): qty 1

## 2016-07-23 MED ORDER — BISACODYL 5 MG PO TBEC: 5.0000 mg | DELAYED_RELEASE_TABLET | Freq: Every day | ORAL | Status: DC | PRN

## 2016-07-23 MED ORDER — ENOXAPARIN SODIUM 40 MG/0.4ML ~~LOC~~ SOLN
40.0000 mg | SUBCUTANEOUS | Status: DC
  Administered 2016-07-23 – 2016-07-24 (×2): 40 mg via SUBCUTANEOUS
  Filled 2016-07-23 (×3): qty 0.4

## 2016-07-23 MED ORDER — SODIUM CHLORIDE 0.9% FLUSH
3.0000 mL | Freq: Two times a day (BID) | INTRAVENOUS | Status: DC
  Administered 2016-07-23 – 2016-07-25 (×5): 3 mL via INTRAVENOUS

## 2016-07-23 NOTE — ED Notes (Signed)
CBG 107 
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
## 2016-07-23 NOTE — ED Notes (Signed)
Patient transported to CT 

## 2016-07-23 NOTE — Care Management Note (Signed)
Case Management Note  Patient Details  Name: Hannah Huerta MRN: [id number] Date of Birth: [birth year]  Subjective/Objective: 56 y.o. F seen in the ED today. CM received consult for assistance with PCP and medications. Spoke with pt via phone to clarify if she did have [employer] as reflected in EPIC and she DOES NOT. Pt tells me she is familiar with [facility] as this is where her husband receives Primary Care. Instructed to go there on Thursday 07/27/2016 @ 08:30 for Open clinic hours and ask for 1) PCP, 2) Medication assistance and 3) Navigator for assist with Insurance. pt agreeable and understands she may not be seen on that day but will be given appt.                     Action/Plan: No further CM needs at this time.    Expected Discharge Date:                  Expected Discharge Plan:  Home/Self Care  In-House Referral:  NA  Discharge planning Services  CM Consult, Medication Assistance, [facility]  Post Acute Care Choice:  NA Choice offered to:  Patient  DME Arranged:  N/A DME Agency:  NA  HH Arranged:  NA HH Agency:  NA  Status of Service:  Completed, signed off  If discussed at [place] of Stay Meetings, dates discussed:    Additional Comments:  Delrae Sawyers, RN 07/23/2016, 2:32 PM
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
## 2016-07-23 NOTE — Progress Notes (Signed)
Patient arrived in the unit accompanied by NT via stretcher. Orientation to the unit given. Patient verbalizes understanding. 

## 2016-07-24 ENCOUNTER — Observation Stay (HOSPITAL_COMMUNITY): Payer: No Typology Code available for payment source

## 2016-07-24 ENCOUNTER — Encounter (HOSPITAL_COMMUNITY): Payer: Self-pay | Admitting: Radiology

## 2016-07-24 ENCOUNTER — Other Ambulatory Visit (HOSPITAL_COMMUNITY): Payer: BLUE CROSS/BLUE SHIELD

## 2016-07-24 DIAGNOSIS — R55 Syncope and collapse: Principal | ICD-10-CM

## 2016-07-24 LAB — GLUCOSE, CAPILLARY: Glucose-Capillary: 122 mg/dL — ABNORMAL HIGH (ref 65–99)

## 2016-07-24 LAB — BASIC METABOLIC PANEL
Anion gap: 6 (ref 5–15)
BUN: 12 mg/dL (ref 6–20)
CO2: 29 mmol/L (ref 22–32)
Calcium: 9 mg/dL (ref 8.9–10.3)
Chloride: 103 mmol/L (ref 101–111)
Creatinine, Ser: 0.96 mg/dL (ref 0.44–1.00)
GFR calc Af Amer: >60 mL/min (ref >60)
GFR calc non Af Amer: >60 mL/min (ref >60)
Glucose, Bld: 104 mg/dL — ABNORMAL HIGH (ref 65–99)
Potassium: 3.6 mmol/L (ref 3.5–5.1)
Sodium: 138 mmol/L (ref 135–145)

## 2016-07-24 LAB — CBC
HCT: 32.6 % — ABNORMAL LOW (ref 36.0–46.0)
Hemoglobin: 10.5 g/dL — ABNORMAL LOW (ref 12.0–15.0)
MCH: 25.4 pg — ABNORMAL LOW (ref 26.0–34.0)
MCHC: 32.2 g/dL (ref 30.0–36.0)
MCV: 78.7 fL (ref 78.0–100.0)
Platelets: 264 10*3/uL (ref 150–400)
RBC: 4.14 MIL/uL (ref 3.87–5.11)
RDW: 14.8 % (ref 11.5–15.5)
WBC: 4.7 10*3/uL (ref 4.0–10.5)

## 2016-07-24 LAB — RAPID URINE DRUG SCREEN, HOSP PERFORMED
Amphetamines: NOT DETECTED
Barbiturates: NOT DETECTED
Benzodiazepines: NOT DETECTED
Cocaine: NOT DETECTED
Opiates: NOT DETECTED
Tetrahydrocannabinol: NOT DETECTED

## 2016-07-24 MED ORDER — IOPAMIDOL (ISOVUE-370) INJECTION 76%
INTRAVENOUS | Status: AC
  Administered 2016-07-24: 100 mL
  Filled 2016-07-24: qty 100

## 2016-07-24 NOTE — Progress Notes (Signed)
MD asked for social work to be called regarding financial services. Sarah from social work aware and will come to bedside to provide resources.  Hannah Huerta
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
## 2016-07-25 ENCOUNTER — Observation Stay (HOSPITAL_BASED_OUTPATIENT_CLINIC_OR_DEPARTMENT_OTHER): Payer: No Typology Code available for payment source

## 2016-07-25 DIAGNOSIS — R55 Syncope and collapse: Secondary | ICD-10-CM

## 2016-07-25 LAB — ECHOCARDIOGRAM COMPLETE
Height: 62 in
Weight: 5059.2 oz

## 2016-07-25 LAB — GLUCOSE, CAPILLARY: Glucose-Capillary: 128 mg/dL — ABNORMAL HIGH (ref 65–99)

## 2016-07-25 MED ORDER — INFLUENZA VAC SPLIT QUAD 0.5 ML IM SUSY
0.5000 mL | PREFILLED_SYRINGE | INTRAMUSCULAR | Status: AC | PRN
  Administered 2016-07-25: 0.5 mL via INTRAMUSCULAR
  Filled 2016-07-25: qty 0.5

## 2016-07-25 MED ORDER — SPIRONOLACTONE 25 MG PO TABS: 25.0000 mg | ORAL_TABLET | Freq: Every day | ORAL | 0 refills | Status: DC

## 2016-07-25 MED ORDER — ATORVASTATIN CALCIUM 40 MG PO TABS: 40.0000 mg | ORAL_TABLET | Freq: Every day | ORAL | 0 refills | Status: DC

## 2016-07-25 MED ORDER — CARVEDILOL 6.25 MG PO TABS: 3.1250 mg | ORAL_TABLET | Freq: Two times a day (BID) | ORAL | 1 refills | Status: DC

## 2016-07-25 NOTE — Clinical Social Work Note (Signed)
CSW provided community resources (including financial resources) to patient. Patient voiced no other needs.  CSW signing off. Consult again if any social work needs arise.  [place] Scrape, [place]

## 2016-07-25 NOTE — Progress Notes (Signed)
Paged pharmacy regarding pt requesting liptor prior to discharge. Medication due at 1800, pharmacy tech stated it is okay to receive dose early due to medication being daily.  Hannah Huerta

## 2016-07-25 NOTE — Progress Notes (Signed)
Paged vascular lab regarding pt VAS [place] lower extremity venous order to rule put DVT, pending for pt discharge. Lab representative stated she will have it completed today but no set time.  Hannah Huerta Leory Plowman

## 2016-07-25 NOTE — Progress Notes (Signed)
**  Preliminary report by tech**  Bilateral lower extremity venous duplex completed. There is no obvious evidence of deep or superficial vein thrombosis involving the right and left lower extremities. All clearly visualized vessels appear patent and compressible. There is no evidence of Baker's cysts bilaterally.  07/25/16 2:49 PM Hannah Huerta RVT

## 2016-07-25 NOTE — Progress Notes (Addendum)
Pt discharge education went over at bedside with patient and family member. Pt IV discontinued, catheter intact and telemetry removed. Pt has all belongings, printed prescriptions and discharge paperwork. Pt discharged via wheelchair with family and nurse.   Hannah Huerta
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
## 2016-07-26 ENCOUNTER — Inpatient Hospital Stay: Payer: BLUE CROSS/BLUE SHIELD

## 2016-07-28 ENCOUNTER — Inpatient Hospital Stay: Payer: BLUE CROSS/BLUE SHIELD

## 2016-10-15 ENCOUNTER — Emergency Department (HOSPITAL_COMMUNITY)
Admission: EM | Admit: 2016-10-15 | Discharge: 2016-10-16 | Disposition: A | Discharge status: Home / Self Care | Payer: Self-pay | Attending: Emergency Medicine | Admitting: Emergency Medicine

## 2016-10-15 ENCOUNTER — Emergency Department (HOSPITAL_COMMUNITY): Payer: Self-pay

## 2016-10-15 ENCOUNTER — Encounter (HOSPITAL_COMMUNITY): Payer: Self-pay

## 2016-10-15 DIAGNOSIS — I110 Hypertensive heart disease with heart failure: Secondary | ICD-10-CM | POA: Insufficient documentation

## 2016-10-15 DIAGNOSIS — I2510 Atherosclerotic heart disease of native coronary artery without angina pectoris: Secondary | ICD-10-CM | POA: Insufficient documentation

## 2016-10-15 DIAGNOSIS — R519 Headache, unspecified: Secondary | ICD-10-CM

## 2016-10-15 DIAGNOSIS — I509 Heart failure, unspecified: Secondary | ICD-10-CM | POA: Insufficient documentation

## 2016-10-15 DIAGNOSIS — R51 Headache: Secondary | ICD-10-CM | POA: Insufficient documentation

## 2016-10-15 MED ORDER — DIPHENHYDRAMINE HCL 50 MG/ML IJ SOLN
25.0000 mg | Freq: Once | INTRAMUSCULAR | Status: AC
  Administered 2016-10-15: 25 mg via INTRAVENOUS
  Filled 2016-10-15: qty 1

## 2016-10-15 MED ORDER — METOCLOPRAMIDE HCL 5 MG/ML IJ SOLN
10.0000 mg | Freq: Once | INTRAMUSCULAR | Status: AC
  Administered 2016-10-15: 10 mg via INTRAVENOUS
  Filled 2016-10-15: qty 2

## 2016-10-15 MED ORDER — SODIUM CHLORIDE 0.9 % IV BOLUS (SEPSIS)
500.0000 mL | Freq: Once | INTRAVENOUS | Status: AC
  Administered 2016-10-15: 500 mL via INTRAVENOUS

## 2016-10-15 MED ORDER — TETRACAINE HCL 0.5 % OP SOLN
2.0000 [drp] | Freq: Once | OPHTHALMIC | Status: AC
  Administered 2016-10-15: 2 [drp] via OPHTHALMIC
  Filled 2016-10-15: qty 2

## 2016-10-15 NOTE — ED Provider Notes (Signed)
[facility] DEPT Provider Note   CSN: [id number] Arrival date & time: 10/15/16  2051     History   Chief Complaint Chief Complaint  Patient presents with  . Migraine    HPI Hannah Huerta is a 56 y.o. female.  Patient with history of headaches, head CT 07/2016 that was unremarkable, presents with c/o right-sided HA x 2 days. Pain is "behind" the right eye and radiates to the side of her head. Her headache pain was less severe but acutely worsened after church today at approximately 3 PM. She had associated right-sided paresthesias. No weakness. She reports that he has difficulty dorsiflexing her right big toe, however it "gets stuck" on occasion and is not new. Patient denies other signs of stroke including: facial droop, slurred speech, aphasia, weakness/full numbness in extremities, imbalance/trouble walking. No falls or head injuries. No dental pain or sinus infection symptoms. No fevers or pain with movement of the neck. Patient reports feeling dizzy described as a sensation of spinning. She vomited one time and reports photophobia. No eye redness or tearing. The onset of this condition was acute. Symptoms unrelieved by Tylenol.       Past Medical History:  Diagnosis Date  . Abnormal Pap smear of cervix    pt couldnt state what type of abnormality  . Anemia   . CAD in native artery 06/05/2015  . Chest pain    a. 02/2012 abnl myoview - inferoapical reverisbility concerning for ischemia, EF 59%;   02/2012 nonobstructive cath  . CHF (congestive heart failure) ([place])   . Chronic migraine   . GERD (gastroesophageal reflux disease)   . HLD (hyperlipidemia)   . Hypertension   . Hypokalemia 06/05/2015  . Metabolic syndrome 87/68/1157  . Morbid obesity with BMI of 50.0-59.9, adult ([place])   . Prediabetes   . S/P cardiac catheterization, non obstructive disease 06/04/15 06/05/2015  . Sickle cell trait ([place])   . Syncope     Patient Active Problem List   Diagnosis Date Noted  .  Syncope 07/23/2016  . Syncope and collapse 07/23/2016  . S/P cardiac catheterization, non obstructive disease 06/04/15 06/05/2015  . CAD in native artery 06/05/2015  . Fever 06/05/2015  . Hypokalemia 06/05/2015  . Metabolic syndrome 26/20/3559  . Chest pain, neg MI, possible GI 06/04/2015  . Colon cancer screening 08/05/2014  . Severe obesity (BMI >= 40) ([place]) 08/05/2014  . Health care maintenance 05/13/2014  . Prediabetes 05/13/2014  . Chronic migraine 04/27/2014  . Atypical chest pain 04/27/2014  . SOB (shortness of breath) 09/16/2013  . Palpitations 09/16/2013  . HTN (hypertension) 03/09/2012  . Hyperlipidemia 03/09/2012  . Microcytic anemia   . Morbid obesity with BMI of 50.0-59.9, adult ([place])   . Precordial pain 03/06/2012  . Dizziness 03/06/2012    Past Surgical History:  Procedure Laterality Date  . BACK SURGERY     x1 lumbar fusion  . CARDIAC CATHETERIZATION N/A 06/04/2015   Procedure: Left Heart Cath and Coronary Angiography;  Surgeon: [name], MD;  Location: [facility];  Service: Cardiovascular;  Laterality: N/A;  . CHOLECYSTECTOMY     laparoscopic  . COLONOSCOPY WITH PROPOFOL N/A 09/04/2014   Procedure: COLONOSCOPY WITH PROPOFOL;  Surgeon: [name], MD;  Location: [facility];  Service: Endoscopy;  Laterality: N/A;  . LEFT HEART CATHETERIZATION WITH CORONARY ANGIOGRAM N/A 03/08/2012   Procedure: LEFT HEART CATHETERIZATION WITH CORONARY ANGIOGRAM;  Surgeon: Hillary Bow, MD;  Location: [facility] CATH LAB;  Service: Cardiovascular;  Laterality: N/A;  . SPINE SURGERY     fusion  . TUBAL LIGATION      OB History    Gravida Para Term Preterm AB Living   5 3     2 3    SAB TAB Ectopic Multiple Live Births     2             Home Medications    Prior to Admission medications   Medication Sig Start Date End Date Taking? Authorizing Provider  acetaminophen (TYLENOL) 500 MG tablet Take 1,000 mg by mouth every 8 (eight) hours as needed (pain).     Historical Provider, MD  atorvastatin (LIPITOR) 40 MG tablet Take 1 tablet (40 mg total) by mouth daily at 6 PM. 07/25/16   Belkys A Regalado, MD  carvedilol (COREG) 6.25 MG tablet Take 0.5 tablets (3.125 mg total) by mouth 2 (two) times daily with a meal. 07/25/16   Belkys A Regalado, MD  spironolactone (ALDACTONE) 25 MG tablet Take 1 tablet (25 mg total) by mouth daily. 07/25/16   Elmarie Shiley, MD    Family History Family History  Problem Relation Age of Onset  . Coronary artery disease Father 55  . Heart disease Father   . Other Father     died in a MVA  . Hypertension Father   . Sudden death Mother 62    Questionable MI  . Diabetes Mother   . Hypertension Mother   . Hypertension Sister   . Pancreatic cancer Maternal Aunt   . Breast cancer Maternal Aunt     after age 12  . Stroke Paternal Grandmother   . Colon polyps Sister   . Colon cancer Neg Hx   . Gallbladder disease Neg Hx   . Esophageal cancer Neg Hx     Social History Social History  Substance Use Topics  . Smoking status: Never Smoker  . Smokeless tobacco: Never Used  . Alcohol use No     Allergies   Patient has no known allergies.   Review of Systems Review of Systems  Constitutional: Negative for fever.  HENT: Negative for congestion, dental problem, rhinorrhea and sinus pressure.   Eyes: Positive for photophobia. Negative for discharge, redness and visual disturbance.  Respiratory: Negative for shortness of breath.   Cardiovascular: Negative for chest pain.  Gastrointestinal: Positive for nausea and vomiting.  Musculoskeletal: Negative for gait problem, neck pain and neck stiffness.  Skin: Negative for rash.  Neurological: Positive for numbness (Paresthesias only) and headaches. Negative for syncope, speech difficulty, weakness and light-headedness.  Psychiatric/Behavioral: Negative for confusion.     Physical Exam Updated Vital Signs BP 136/68 (BP Location: Right Arm)   Pulse 80   Temp 99.3  F (37.4 C) (Oral)   Resp 20   Ht 5\' 2"  (1.575 m)   Wt 127 kg   LMP 09/15/2013   SpO2 100%   BMI 51.21 kg/m   Physical Exam  Constitutional: She is oriented to person, place, and time. She appears well-developed and well-nourished.  HENT:  Head: Normocephalic and atraumatic.  Right Ear: Tympanic membrane, external ear and ear canal normal.  Left Ear: Tympanic membrane, external ear and ear canal normal.  Nose: Nose normal.  Mouth/Throat: Uvula is midline, oropharynx is clear and moist and mucous membranes are normal.  Eyes: Conjunctivae, EOM and lids are normal. Pupils are equal, round, and reactive to light. Right eye exhibits no nystagmus. Left eye exhibits no nystagmus.  Several beats of or is  on total nystagmus with quick phase to the right which resolves after approximately one second. No vertical or rotary nystagmus noted.  Neck: Normal range of motion. Neck supple.  Cardiovascular: Normal rate and regular rhythm.  Exam reveals no friction rub.   No murmur heard. Pulmonary/Chest: Effort normal and breath sounds normal. No respiratory distress. She has no wheezes. She has no rales.  Abdominal: Soft. There is no tenderness.  Musculoskeletal:       Cervical back: She exhibits normal range of motion, no tenderness and no bony tenderness.  Neurological: She is alert and oriented to person, place, and time. She has normal strength and normal reflexes. No cranial nerve deficit or sensory deficit. She displays a negative Romberg sign. Coordination and gait normal. GCS eye subscore is 4. GCS verbal subscore is 5. GCS motor subscore is 6.  Patient assisted out of bed by myself, she ambulates slowly but is able to do so without assistance or any foot drop.  Skin: Skin is warm and dry.  Psychiatric: She has a normal mood and affect.  Nursing note and vitals reviewed.    ED Treatments / Results   Radiology Ct Head Wo Contrast  Result Date: 10/15/2016 CLINICAL DATA:  Migraine headache  EXAM: CT HEAD WITHOUT CONTRAST TECHNIQUE: Contiguous axial images were obtained from the base of the skull through the vertex without intravenous contrast. COMPARISON:  07/23/2016 CT FINDINGS: BRAIN: The ventricles and sulci are normal. No intraparenchymal hemorrhage, mass effect nor midline shift. No acute large vascular territory infarcts. Grey-white matter distinction is maintained. The basal ganglia are unremarkable. No abnormal extra-axial fluid collections. Basal cisterns are not effaced and midline. The brainstem and cerebellar hemispheres are without acute abnormalities. VASCULAR: Unremarkable. SKULL/SOFT TISSUES: No skull fracture. No significant soft tissue swelling. ORBITS/SINUSES: The included ocular globes and orbital contents are normal.The mastoid air cells are clear. The included paranasal sinuses are well-aerated. OTHER: None. IMPRESSION: No acute intracranial abnormality. Electronically Signed   By: Ashley Royalty M.D.   On: 10/15/2016 22:51    Procedures Procedures (including critical care time)  Medications Ordered in ED Medications - No data to display   Initial Impression / Assessment and Plan / ED Course  I have reviewed the triage vital signs and the nursing notes.  Pertinent labs & imaging results that were available during my care of the patient were reviewed by me and considered in my medical decision making (see chart for details).     Patient seen and examined. Work-up initiated. Given that this is an atypical headache for her, will obtain head CT. I've low suspicion overall for bleeding. Patient is able to ambulate and she has a normal neurological exam at this time. Low suspicion for stroke including posterior stroke.  Vital signs reviewed and are as follows: BP 136/68 (BP Location: Right Arm)   Pulse 80   Temp 99.3 F (37.4 C) (Oral)   Resp 20   Ht 5\' 2"  (1.575 m)   Wt 127 kg   LMP 09/15/2013   SpO2 100%   BMI 51.21 kg/m   11:01 PM patient updated on CT  results. She states that her headache is gradually improving. Will perform tonometry given that her symptoms are worse behind her right eye.  Patient resting in room. She reports near resolution of symptoms. Will discharge to home.  Two drops of tetracaine instilled into affected eye.   Tonometry performed. Right eye pressure: 22   Patient tolerated procedure well without immediate complication.  Patient counseled to return if they have weakness in their arms or legs, slurred speech, trouble walking or talking, confusion, trouble with their balance, or if they have any other concerns. Patient verbalizes understanding and agrees with plan.   Final Clinical Impressions(s) / ED Diagnoses   Final diagnoses:  Bad headache   Patient with headache, slightly atypical from previous in severity. Patient without high-risk features of headache including: sudden onset/thunderclap HA, no similar headache in past, altered mental status, accompanying seizure, headache with exertion, history of immunocompromise, neck or shoulder pain, fever, use of anticoagulation, family history of spontaneous SAH, concomitant drug use, toxic exposure.   Patient has a normal complete neurological exam, normal vital signs, normal level of consciousness, no signs of meningismus, is well-appearing/non-toxic appearing, no signs of trauma, no pain over the temporal arteries.   CT imaging is reassuring.  No dangerous or life-threatening conditions suspected or identified by history, physical exam, and by work-up. No indications for hospitalization identified.    New Prescriptions New Prescriptions   No medications on file     Carlisle Cater, PA-C 10/15/16 [place], DO 10/17/16 2009

## 2016-10-15 NOTE — ED Triage Notes (Signed)
Pt states she woke up this morning with migraine that caused pain in her eyes; pt states hx of migraines but this feels like the worse in a few years; Pt a&ox 4 on arrival. Pt presents wearing shades to block the light; pt states pain is 8/10 on arrival. No other complaints on arrival.

## 2016-10-15 NOTE — Discharge Instructions (Signed)
Please read and follow all provided instructions.  Your diagnoses today include:  1. Bad headache     Tests performed today include:  CT of your head which was normal and did not show any serious cause of your headache  Vital signs. See below for your results today.   Medications:  In the Emergency Department you received:  Reglan - antinausea/headache medication  Benadryl - antihistamine to counteract potential side effects of reglan  Take any prescribed medications only as directed.  Additional information:  Follow any educational materials contained in this packet.  You are having a headache. No specific cause was found today for your headache. It may have been a migraine or other cause of headache. Stress, anxiety, fatigue, and depression are common triggers for headaches.   Your headache today does not appear to be life-threatening or require hospitalization, but often the exact cause of headaches is not determined in the emergency department. Therefore, follow-up with your doctor is very important to find out what may have caused your headache and whether or not you need any further diagnostic testing or treatment.   Sometimes headaches can appear benign (not harmful), but then more serious symptoms can develop which should prompt an immediate re-evaluation by your doctor or the emergency department.  BE VERY CAREFUL not to take multiple medicines containing Tylenol (also called acetaminophen). Doing so can lead to an overdose which can damage your liver and cause liver failure and possibly death.   Follow-up instructions: Please follow-up with your primary care provider in the next 3 days for further evaluation of your symptoms.   Return instructions:   Please return to the Emergency Department if you experience worsening symptoms.  Return if the medications do not resolve your headache, if it recurs, or if you have multiple episodes of vomiting or cannot keep down  fluids.  Return if you have a change from the usual headache.  RETURN IMMEDIATELY IF you:  Develop a sudden, severe headache  Develop confusion or become poorly responsive or faint  Develop a fever above 100.1F or problem breathing  Have a change in speech, vision, swallowing, or understanding  Develop new weakness, numbness, tingling, incoordination in your arms or legs  Have a seizure  Please return if you have any other emergent concerns.  Additional Information:  Your vital signs today were: BP 136/68 (BP Location: Right Arm)    Pulse 80    Temp 99.3 F (37.4 C) (Oral)    Resp 20    Ht 5\' 2"  (1.575 m)    Wt 127 kg    LMP 09/15/2013    SpO2 100%    BMI 51.21 kg/m  If your blood pressure (BP) was elevated above 135/85 this visit, please have this repeated by your doctor within one month. --------------

## 2017-02-27 ENCOUNTER — Emergency Department (HOSPITAL_COMMUNITY): Payer: Self-pay

## 2017-02-27 ENCOUNTER — Inpatient Hospital Stay (HOSPITAL_COMMUNITY)
Admission: EM | Admit: 2017-02-27 | Discharge: 2017-03-01 | DRG: 202 | Disposition: A | Discharge status: Home / Self Care | Payer: Self-pay | Attending: Family Medicine | Admitting: Family Medicine

## 2017-02-27 ENCOUNTER — Encounter (HOSPITAL_COMMUNITY): Payer: Self-pay | Admitting: Emergency Medicine

## 2017-02-27 DIAGNOSIS — R7303 Prediabetes: Secondary | ICD-10-CM | POA: Diagnosis present

## 2017-02-27 DIAGNOSIS — R079 Chest pain, unspecified: Secondary | ICD-10-CM | POA: Diagnosis present

## 2017-02-27 DIAGNOSIS — Z79899 Other long term (current) drug therapy: Secondary | ICD-10-CM

## 2017-02-27 DIAGNOSIS — R0602 Shortness of breath: Secondary | ICD-10-CM

## 2017-02-27 DIAGNOSIS — R0789 Other chest pain: Secondary | ICD-10-CM

## 2017-02-27 DIAGNOSIS — I110 Hypertensive heart disease with heart failure: Secondary | ICD-10-CM | POA: Diagnosis present

## 2017-02-27 DIAGNOSIS — R072 Precordial pain: Secondary | ICD-10-CM

## 2017-02-27 DIAGNOSIS — I2510 Atherosclerotic heart disease of native coronary artery without angina pectoris: Secondary | ICD-10-CM | POA: Diagnosis present

## 2017-02-27 DIAGNOSIS — K219 Gastro-esophageal reflux disease without esophagitis: Secondary | ICD-10-CM | POA: Diagnosis present

## 2017-02-27 DIAGNOSIS — I509 Heart failure, unspecified: Secondary | ICD-10-CM | POA: Diagnosis present

## 2017-02-27 DIAGNOSIS — E785 Hyperlipidemia, unspecified: Secondary | ICD-10-CM | POA: Diagnosis present

## 2017-02-27 DIAGNOSIS — Z981 Arthrodesis status: Secondary | ICD-10-CM

## 2017-02-27 DIAGNOSIS — E876 Hypokalemia: Secondary | ICD-10-CM | POA: Diagnosis present

## 2017-02-27 DIAGNOSIS — Z6841 Body Mass Index (BMI) 40.0 and over, adult: Secondary | ICD-10-CM

## 2017-02-27 DIAGNOSIS — J209 Acute bronchitis, unspecified: Principal | ICD-10-CM | POA: Diagnosis present

## 2017-02-27 DIAGNOSIS — D573 Sickle-cell trait: Secondary | ICD-10-CM | POA: Diagnosis present

## 2017-02-27 DIAGNOSIS — E871 Hypo-osmolality and hyponatremia: Secondary | ICD-10-CM | POA: Diagnosis present

## 2017-02-27 DIAGNOSIS — G4733 Obstructive sleep apnea (adult) (pediatric): Secondary | ICD-10-CM | POA: Diagnosis present

## 2017-02-27 DIAGNOSIS — N179 Acute kidney failure, unspecified: Secondary | ICD-10-CM | POA: Diagnosis present

## 2017-02-27 LAB — CBC WITH DIFFERENTIAL/PLATELET
Basophils Absolute: 0 10*3/uL (ref 0.0–0.1)
Basophils Relative: 0 %
Eosinophils Absolute: 0.2 10*3/uL (ref 0.0–0.7)
Eosinophils Relative: 3 %
HCT: 37.7 % (ref 36.0–46.0)
Hemoglobin: 12.7 g/dL (ref 12.0–15.0)
Lymphocytes Relative: 35 %
Lymphs Abs: 2.4 10*3/uL (ref 0.7–4.0)
MCH: 25.8 pg — ABNORMAL LOW (ref 26.0–34.0)
MCHC: 33.7 g/dL (ref 30.0–36.0)
MCV: 76.5 fL — ABNORMAL LOW (ref 78.0–100.0)
Monocytes Absolute: 0.8 10*3/uL (ref 0.1–1.0)
Monocytes Relative: 11 %
Neutro Abs: 3.6 10*3/uL (ref 1.7–7.7)
Neutrophils Relative %: 51 %
Platelets: 323 10*3/uL (ref 150–400)
RBC: 4.93 MIL/uL (ref 3.87–5.11)
RDW: 14.9 % (ref 11.5–15.5)
WBC: 7 10*3/uL (ref 4.0–10.5)

## 2017-02-27 LAB — COMPREHENSIVE METABOLIC PANEL
ALT: 21 U/L (ref 14–54)
AST: 45 U/L — ABNORMAL HIGH (ref 15–41)
Albumin: 3.7 g/dL (ref 3.5–5.0)
Alkaline Phosphatase: 80 U/L (ref 38–126)
Anion gap: 13 (ref 5–15)
BUN: 24 mg/dL — ABNORMAL HIGH (ref 6–20)
CO2: 28 mmol/L (ref 22–32)
Calcium: 9.4 mg/dL (ref 8.9–10.3)
Chloride: 90 mmol/L — ABNORMAL LOW (ref 101–111)
Creatinine, Ser: 1.59 mg/dL — ABNORMAL HIGH (ref 0.44–1.00)
GFR calc Af Amer: 41 mL/min — ABNORMAL LOW (ref >60)
GFR calc non Af Amer: 36 mL/min — ABNORMAL LOW (ref >60)
Glucose, Bld: 137 mg/dL — ABNORMAL HIGH (ref 65–99)
Potassium: 2.8 mmol/L — ABNORMAL LOW (ref 3.5–5.1)
Sodium: 131 mmol/L — ABNORMAL LOW (ref 135–145)
Total Bilirubin: 0.5 mg/dL (ref 0.3–1.2)
Total Protein: 8.4 g/dL — ABNORMAL HIGH (ref 6.5–8.1)

## 2017-02-27 LAB — D-DIMER, QUANTITATIVE (NOT AT ARMC): D-Dimer, Quant: 0.85 ug/mL-FEU — ABNORMAL HIGH (ref 0.00–0.50)

## 2017-02-27 LAB — I-STAT CG4 LACTIC ACID, ED: Lactic Acid, Venous: 2.47 mmol/L (ref 0.5–1.9)

## 2017-02-27 LAB — BRAIN NATRIURETIC PEPTIDE: B Natriuretic Peptide: 4.8 pg/mL (ref 0.0–100.0)

## 2017-02-27 LAB — LIPASE, BLOOD: Lipase: 34 U/L (ref 11–51)

## 2017-02-27 LAB — I-STAT TROPONIN, ED: Troponin i, poc: 0.01 ng/mL (ref 0.00–0.08)

## 2017-02-27 MED ORDER — IOPAMIDOL (ISOVUE-300) INJECTION 61%
INTRAVENOUS | Status: AC
  Administered 2017-02-27: 80 mL
  Filled 2017-02-27: qty 100

## 2017-02-27 MED ORDER — SODIUM CHLORIDE 0.9 % IV BOLUS (SEPSIS)
500.0000 mL | Freq: Once | INTRAVENOUS | Status: AC
  Administered 2017-02-27: 500 mL via INTRAVENOUS

## 2017-02-27 MED ORDER — POTASSIUM CHLORIDE 10 MEQ/100ML IV SOLN
10.0000 meq | INTRAVENOUS | Status: AC
  Administered 2017-02-27 – 2017-02-28 (×3): 10 meq via INTRAVENOUS
  Filled 2017-02-27 (×3): qty 100

## 2017-02-27 MED ORDER — MORPHINE SULFATE (PF) 4 MG/ML IV SOLN
4.0000 mg | Freq: Once | INTRAVENOUS | Status: AC
  Administered 2017-02-27: 4 mg via INTRAVENOUS
  Filled 2017-02-27: qty 1

## 2017-02-27 MED ORDER — ONDANSETRON HCL 4 MG/2ML IJ SOLN
4.0000 mg | Freq: Once | INTRAMUSCULAR | Status: AC
  Administered 2017-02-27: 4 mg via INTRAVENOUS
  Filled 2017-02-27: qty 2

## 2017-02-27 NOTE — ED Notes (Signed)
Dr. Sherry Ruffing notified on pt.'s elevated Lactic Acid result.

## 2017-02-27 NOTE — ED Triage Notes (Signed)
Pt c/o nonproductive cough with shortness of breath for a few days.  Also c/o bil lower abd cramping Nausea with vomiting today

## 2017-02-27 NOTE — ED Provider Notes (Signed)
[facility] DEPT Provider Note   CSN: [id number] Arrival date & time: 02/27/17  2059     History   Chief Complaint Chief Complaint  Patient presents with  . Cough  . Emesis    HPI Hannah Huerta is a 56 y.o. female.  The history is provided by the patient and medical records.  Chest Pain   This is a new problem. The current episode started more than 2 days ago. The problem occurs constantly. The problem has been gradually worsening. The pain is associated with exertion. The pain is present in the substernal region. The pain is at a severity of 8/10. The pain is severe. The quality of the pain is described as pressure-like. The pain does not radiate. Duration of episode(s) is 1 week. The symptoms are aggravated by exertion. Associated symptoms include abdominal pain, cough, diaphoresis, exertional chest pressure, lower extremity edema, malaise/fatigue, nausea, shortness of breath and vomiting. Pertinent negatives include no back pain, no fever, no headaches, no hemoptysis and no palpitations. She has tried rest for the symptoms. The treatment provided mild relief.  Her past medical history is significant for CAD and CHF.    Past Medical History:  Diagnosis Date  . Abnormal Pap smear of cervix    pt couldnt state what type of abnormality  . Anemia   . CAD in native artery 06/05/2015  . Chest pain    a. 02/2012 abnl myoview - inferoapical reverisbility concerning for ischemia, EF 59%;   02/2012 nonobstructive cath  . CHF (congestive heart failure) ([place])   . Chronic migraine   . GERD (gastroesophageal reflux disease)   . HLD (hyperlipidemia)   . Hypertension   . Hypokalemia 06/05/2015  . Metabolic syndrome 68/09/2120  . Morbid obesity with BMI of 50.0-59.9, adult ([place])   . Prediabetes   . S/P cardiac catheterization, non obstructive disease 06/04/15 06/05/2015  . Sickle cell trait ([place])   . Syncope     Patient Active Problem List   Diagnosis Date Noted  . Syncope  07/23/2016  . Syncope and collapse 07/23/2016  . S/P cardiac catheterization, non obstructive disease 06/04/15 06/05/2015  . CAD in native artery 06/05/2015  . Fever 06/05/2015  . Hypokalemia 06/05/2015  . Metabolic syndrome 48/25/0037  . Chest pain, neg MI, possible GI 06/04/2015  . Colon cancer screening 08/05/2014  . Severe obesity (BMI >= 40) ([place]) 08/05/2014  . Health care maintenance 05/13/2014  . Prediabetes 05/13/2014  . Chronic migraine 04/27/2014  . Atypical chest pain 04/27/2014  . SOB (shortness of breath) 09/16/2013  . Palpitations 09/16/2013  . HTN (hypertension) 03/09/2012  . Hyperlipidemia 03/09/2012  . Microcytic anemia   . Morbid obesity with BMI of 50.0-59.9, adult ([place])   . Precordial pain 03/06/2012  . Dizziness 03/06/2012    Past Surgical History:  Procedure Laterality Date  . BACK SURGERY     x1 lumbar fusion  . CARDIAC CATHETERIZATION N/A 06/04/2015   Procedure: Left Heart Cath and Coronary Angiography;  Surgeon: [name], MD;  Location: [facility];  Service: Cardiovascular;  Laterality: N/A;  . CHOLECYSTECTOMY     laparoscopic  . COLONOSCOPY WITH PROPOFOL N/A 09/04/2014   Procedure: COLONOSCOPY WITH PROPOFOL;  Surgeon: [name], MD;  Location: [facility];  Service: Endoscopy;  Laterality: N/A;  . LEFT HEART CATHETERIZATION WITH CORONARY ANGIOGRAM N/A 03/08/2012   Procedure: LEFT HEART CATHETERIZATION WITH CORONARY ANGIOGRAM;  Surgeon: Hillary Bow, MD;  Location: [facility] CATH LAB;  Service: Cardiovascular;  Laterality: N/A;  . SPINE SURGERY     fusion  . TUBAL LIGATION      OB History    Gravida Para Term Preterm AB Living   5 3     2 3    SAB TAB Ectopic Multiple Live Births     2             Home Medications    Prior to Admission medications   Medication Sig Start Date End Date Taking? Authorizing Provider  acetaminophen (TYLENOL) 500 MG tablet Take 1,000 mg by mouth every 8 (eight) hours as needed (pain).    [provider]  atorvastatin (LIPITOR) 40 MG tablet Take 1 tablet (40 mg total) by mouth daily at 6 PM. 07/25/16   Regalado, Belkys A, MD  carvedilol (COREG) 6.25 MG tablet Take 0.5 tablets (3.125 mg total) by mouth 2 (two) times daily with a meal. 07/25/16   Regalado, Belkys A, MD  spironolactone (ALDACTONE) 25 MG tablet Take 1 tablet (25 mg total) by mouth daily. 07/25/16   Regalado, Cassie Freer, MD    Family History Family History  Problem Relation Age of Onset  . Coronary artery disease Father 3  . Heart disease Father   . Other Father        died in a MVA  . Hypertension Father   . Sudden death Mother 16       Questionable MI  . Diabetes Mother   . Hypertension Mother   . Hypertension Sister   . Pancreatic cancer Maternal Aunt   . Breast cancer Maternal Aunt        after age 31  . Stroke Paternal Grandmother   . Colon polyps Sister   . Colon cancer Neg Hx   . Gallbladder disease Neg Hx   . Esophageal cancer Neg Hx     Social History Social History  Substance Use Topics  . Smoking status: Never Smoker  . Smokeless tobacco: Never Used  . Alcohol use No     Allergies   Patient has no known allergies.   Review of Systems Review of Systems  Constitutional: Positive for chills, diaphoresis and malaise/fatigue. Negative for appetite change and fever.  HENT: Negative for congestion.   Eyes: Negative for visual disturbance.  Respiratory: Positive for cough, chest tightness and shortness of breath. Negative for hemoptysis and wheezing.   Cardiovascular: Positive for chest pain and leg swelling. Negative for palpitations.  Gastrointestinal: Positive for abdominal pain, diarrhea, nausea and vomiting. Negative for constipation.  Genitourinary: Negative for dysuria, flank pain, vaginal bleeding, vaginal discharge and vaginal pain.  Musculoskeletal: Negative for back pain and neck pain.  Neurological: Negative for light-headedness and headaches.  Psychiatric/Behavioral: Negative  for agitation.  All other systems reviewed and are negative.    Physical Exam Updated Vital Signs BP (!) 146/87 (BP Location: Right Arm)   Pulse (!) 106   Temp 99.5 F (37.5 C) (Oral)   Resp (!) 24   Ht 5\' 2"  (1.575 m)   Wt 136.1 kg (300 lb)   LMP 09/15/2013   SpO2 93%   BMI 54.87 kg/m   Physical Exam  Constitutional: She is oriented to person, place, and time. She appears well-developed and well-nourished. No distress.  HENT:  Head: Normocephalic.  Mouth/Throat: Oropharynx is clear and moist. No oropharyngeal exudate.  Eyes: Pupils are equal, round, and reactive to light. Conjunctivae and EOM are normal.  Neck: Normal range of motion.  Cardiovascular: Normal heart sounds and  intact distal pulses.  Tachycardia present.   No murmur heard. Pulmonary/Chest: Effort normal. No stridor. No respiratory distress. She has no wheezes. She has no rales. She exhibits no tenderness.  Abdominal: She exhibits no distension. There is tenderness.  Musculoskeletal: She exhibits edema.  Neurological: She is alert and oriented to person, place, and time. No sensory deficit. She exhibits normal muscle tone.  Skin: Capillary refill takes less than 2 seconds. No rash noted. She is not diaphoretic. No erythema.  Psychiatric: She has a normal mood and affect.  Nursing note and vitals reviewed.    ED Treatments / Results  Labs (all labs ordered are listed, but only abnormal results are displayed) Labs Reviewed  CBC WITH DIFFERENTIAL/PLATELET - Abnormal; Notable for the following:       Result Value   MCV 76.5 (*)    MCH 25.8 (*)    All other components within normal limits  COMPREHENSIVE METABOLIC PANEL - Abnormal; Notable for the following:    Sodium 131 (*)    Potassium 2.8 (*)    Chloride 90 (*)    Glucose, Bld 137 (*)    BUN 24 (*)    Creatinine, Ser 1.59 (*)    Total Protein 8.4 (*)    AST 45 (*)    GFR calc non Af Amer 36 (*)    GFR calc Af Amer 41 (*)    All other components  within normal limits  D-DIMER, QUANTITATIVE (NOT AT [facility]) - Abnormal; Notable for the following:    D-Dimer, Quant 0.85 (*)    All other components within normal limits  I-STAT CG4 LACTIC ACID, ED - Abnormal; Notable for the following:    Lactic Acid, Venous 2.47 (*)    All other components within normal limits  LIPASE, BLOOD  URINALYSIS, ROUTINE W REFLEX MICROSCOPIC  BRAIN NATRIURETIC PEPTIDE  I-STAT TROPONIN, ED  POC OCCULT BLOOD, ED    EKG  EKG Interpretation  Date/Time:  Tuesday February 27 2017 21:23:37 EDT Ventricular Rate:  102 PR Interval:  152 QRS Duration: 88 QT Interval:  360 QTC Calculation: 469 R Axis:   -25 Text Interpretation:  Sinus tachycardia Minimal voltage criteria for LVH, may be normal variant T wave abnormality, consider lateral ischemia Abnormal ECG When compared to prior, T wave inversions in lead 1 and AVL.  No STEMI Confirmed by Antony Blackbird [phone]) on 02/27/2017 11:13:14 PM       Radiology Dg Chest 2 View  Result Date: 02/27/2017 CLINICAL DATA:  Nonproductive cough.  Shortness of breath. EXAM: CHEST  2 VIEW COMPARISON:  Radiographs and CT January 2018 FINDINGS: The cardiomediastinal contours are normal. The lungs are clear. Pulmonary vasculature is normal. No consolidation, pleural effusion, or pneumothorax. No acute osseous abnormalities are seen. IMPRESSION: No active cardiopulmonary disease. Electronically Signed   By: Jeb Levering M.D.   On: 02/27/2017 21:43    Procedures Procedures (including critical care time)  Medications Ordered in ED Medications  potassium chloride 10 mEq in 100 mL IVPB (not administered)  sodium chloride 0.9 % bolus 500 mL (500 mLs Intravenous New Bag/Given 02/27/17 2244)  morphine 4 MG/ML injection 4 mg (4 mg Intravenous Given 02/27/17 2244)  ondansetron (ZOFRAN) injection 4 mg (4 mg Intravenous Given 02/27/17 2244)  iopamidol (ISOVUE-300) 61 % injection (80 mLs  Contrast Given 02/27/17 2317)     Initial  Impression / Assessment and Plan / ED Course  I have reviewed the triage vital signs and the nursing notes.  Pertinent  labs & imaging results that were available during my care of the patient were reviewed by me and considered in my medical decision making (see chart for details).     Hannah Huerta is a 56 y.o. female with a past medical history significant for CAD (40% LAD lesion on 2016 cath), CHF, hypertension, hyperlipidemia, morbid obesity, and GERD who presents with chest pain, shortness of breath, nausea, vomiting, diaphoresis, abdominal pain, diarrhea, chills, generalized edema in her legs, report of dark stools and productive cough. Patient reports that she has been off of medications for the last 2 years. She says that for the last 2 weeks, she has been having worsening symptoms. She says the abdominal pain and chest pain were her primary complaints. She describes the pain as someone "sitting on my chest" and as an 8 out of 10 severity on arrival. She says that it is worsened with exertion. She also feels like she is drowning with her shortness of breath. She says that when she had her heart disease and catheterization, she did not have chest pain but had shortness of breath. She says the shortness of breath feels similar. She reports that she has had bilateral upper extremity edema worsening in the last 2 weeks. She says that she is having severe pain in her upper abdomen. She says that the chest pain does not radiate. She reports nausea and vomiting today but reports nausea for last 2 weeks. She reports decreased appetite. He reports some diaphoretic spells. She reports that she gets very short of breath with exertion. She reports that she has a cough with a clear productive to do. No hemoptysis. No history of DVT or PE.  On arrival, patient was found to be tachycardic and tachypnea. Patient's initial temperature was 99.5 orally. Patient was not febrile rectally.  Initial laboratory testing  performed in triage revealed an elevated lactic acid of 2.47, troponin negative. Lipase not elevated. Patient however was found to have a acute kidney injury creatinine of 1.59, elevated from prior of 0.96. Potassium low at 2.8. No leukocytosis or anemia. X-ray showed no acute cardio pulmonary disease.   EKG showed T-wave inversions that are new compared to prior in lead 1 and aVL.  Exam, patient had diffuse abdominal tenderness. No CVA tenderness. Lungs were clear. S was nontender however exam was difficult due to body habitus. No significant focal neurologic deficits. Bilateral lower extremity edema present.   Fecal occult test was performed.  Patient is awaiting results of CT imaging of the abdomen and pelvis given the extent of the abdominal tenderness. Suspect patient will need admission due to high risk chest pain with the known CAD, her concerning chest pain, and her however need to rule out intra-abdominal pathology first. Patient will be given pain medicine, nausea medicine and a small amount of fluids given the AKI. Do not feel patient has CHF exacerbation despite leg swelling as she has clear lungs and chest x-ray shows no fluid. The NP nonelevated.  11:55 PM Patient CT imaging results are seen above. No acute abnormal mellitus seen to explain the patient's abdominal pain. Patient does have hepatomegaly and hepatic steatosis.   D-dimer returned positive. As patient has AK I just received contrast for abdomen, do not feel patient should get another dose of contrast to rule out PE at this time however, this still considered as cause of symptoms.  Patient reassessed and is now chest pain-free. Given the patient's known CAD, atypical chest pain, and EKG changes, patient  will need admission for high risk chest pain. Patient also may need further workup for possible PE. Unassigned call will be placed for admission.   Final Clinical Impressions(s) / ED Diagnoses   Final diagnoses:    Precordial chest pain  Shortness of breath    Clinical Impression: 1. Precordial chest pain   2. Shortness of breath     Disposition: Admit to Hospitalist service      Tegeler, Gwenyth Allegra, MD 02/28/17 [phone]

## 2017-02-28 ENCOUNTER — Encounter (HOSPITAL_COMMUNITY): Payer: Self-pay | Admitting: Internal Medicine

## 2017-02-28 ENCOUNTER — Observation Stay (HOSPITAL_COMMUNITY): Payer: Self-pay

## 2017-02-28 DIAGNOSIS — N179 Acute kidney failure, unspecified: Secondary | ICD-10-CM | POA: Diagnosis present

## 2017-02-28 DIAGNOSIS — R0789 Other chest pain: Secondary | ICD-10-CM

## 2017-02-28 DIAGNOSIS — R072 Precordial pain: Secondary | ICD-10-CM

## 2017-02-28 DIAGNOSIS — E876 Hypokalemia: Secondary | ICD-10-CM

## 2017-02-28 DIAGNOSIS — E871 Hypo-osmolality and hyponatremia: Secondary | ICD-10-CM | POA: Diagnosis present

## 2017-02-28 LAB — URINALYSIS, ROUTINE W REFLEX MICROSCOPIC
Bacteria, UA: NONE SEEN
Bilirubin Urine: NEGATIVE
Glucose, UA: NEGATIVE mg/dL
Ketones, ur: NEGATIVE mg/dL
Leukocytes, UA: NEGATIVE
Nitrite: NEGATIVE
Protein, ur: NEGATIVE mg/dL
Specific Gravity, Urine: 1.03 (ref 1.005–1.030)
pH: 5 (ref 5.0–8.0)

## 2017-02-28 LAB — COMPREHENSIVE METABOLIC PANEL
ALT: 20 U/L (ref 14–54)
AST: 52 U/L — ABNORMAL HIGH (ref 15–41)
Albumin: 3.3 g/dL — ABNORMAL LOW (ref 3.5–5.0)
Alkaline Phosphatase: 68 U/L (ref 38–126)
Anion gap: 12 (ref 5–15)
BUN: 21 mg/dL — ABNORMAL HIGH (ref 6–20)
CO2: 31 mmol/L (ref 22–32)
Calcium: 9.1 mg/dL (ref 8.9–10.3)
Chloride: 94 mmol/L — ABNORMAL LOW (ref 101–111)
Creatinine, Ser: 1.47 mg/dL — ABNORMAL HIGH (ref 0.44–1.00)
GFR calc Af Amer: 45 mL/min — ABNORMAL LOW (ref >60)
GFR calc non Af Amer: 39 mL/min — ABNORMAL LOW (ref >60)
Glucose, Bld: 133 mg/dL — ABNORMAL HIGH (ref 65–99)
Potassium: 3.3 mmol/L — ABNORMAL LOW (ref 3.5–5.1)
Sodium: 137 mmol/L (ref 135–145)
Total Bilirubin: 0.6 mg/dL (ref 0.3–1.2)
Total Protein: 7.5 g/dL (ref 6.5–8.1)

## 2017-02-28 LAB — OCCULT BLOOD, POC DEVICE: Fecal Occult Bld: NEGATIVE

## 2017-02-28 LAB — CBC
HCT: 34.3 % — ABNORMAL LOW (ref 36.0–46.0)
Hemoglobin: 11.3 g/dL — ABNORMAL LOW (ref 12.0–15.0)
MCH: 24.9 pg — ABNORMAL LOW (ref 26.0–34.0)
MCHC: 32.9 g/dL (ref 30.0–36.0)
MCV: 75.7 fL — ABNORMAL LOW (ref 78.0–100.0)
Platelets: 335 10*3/uL (ref 150–400)
RBC: 4.53 MIL/uL (ref 3.87–5.11)
RDW: 14.6 % (ref 11.5–15.5)
WBC: 6.5 10*3/uL (ref 4.0–10.5)

## 2017-02-28 LAB — OSMOLALITY: Osmolality: 292 mOsm/kg (ref 275–295)

## 2017-02-28 LAB — SODIUM, URINE, RANDOM: Sodium, Ur: 60 mmol/L

## 2017-02-28 LAB — I-STAT CG4 LACTIC ACID, ED: Lactic Acid, Venous: 1.68 mmol/L (ref 0.5–1.9)

## 2017-02-28 LAB — HIV ANTIBODY (ROUTINE TESTING W REFLEX): HIV Screen 4th Generation wRfx: NONREACTIVE

## 2017-02-28 LAB — TSH: TSH: 3.153 u[IU]/mL (ref 0.350–4.500)

## 2017-02-28 LAB — TROPONIN I
Troponin I: <0.03 ng/mL (ref <0.03)
Troponin I: <0.03 ng/mL (ref <0.03)
Troponin I: <0.03 ng/mL (ref <0.03)

## 2017-02-28 LAB — CORTISOL: Cortisol, Plasma: 16 ug/dL

## 2017-02-28 LAB — OSMOLALITY, URINE: Osmolality, Ur: 452 mOsm/kg (ref 300–900)

## 2017-02-28 MED ORDER — POTASSIUM CHLORIDE 10 MEQ/100ML IV SOLN
10.0000 meq | INTRAVENOUS | Status: AC
  Administered 2017-02-28 (×3): 10 meq via INTRAVENOUS
  Filled 2017-02-28 (×3): qty 100

## 2017-02-28 MED ORDER — SODIUM CHLORIDE 0.9 % IV SOLN
INTRAVENOUS | Status: AC
  Administered 2017-02-28: 03:00:00 via INTRAVENOUS

## 2017-02-28 MED ORDER — DEXTROMETHORPHAN POLISTIREX ER 30 MG/5ML PO SUER
15.0000 mg | Freq: Two times a day (BID) | ORAL | Status: DC | PRN
  Administered 2017-02-28 – 2017-03-01 (×2): 15 mg via ORAL
  Filled 2017-02-28 (×3): qty 5

## 2017-02-28 MED ORDER — TRAMADOL HCL 50 MG PO TABS
50.0000 mg | ORAL_TABLET | Freq: Four times a day (QID) | ORAL | Status: DC | PRN
  Administered 2017-02-28: 50 mg via ORAL
  Filled 2017-02-28: qty 1

## 2017-02-28 MED ORDER — ENOXAPARIN SODIUM 40 MG/0.4ML ~~LOC~~ SOLN
40.0000 mg | SUBCUTANEOUS | Status: DC
  Administered 2017-02-28 – 2017-03-01 (×2): 40 mg via SUBCUTANEOUS
  Filled 2017-02-28 (×2): qty 0.4

## 2017-02-28 MED ORDER — ACETAMINOPHEN 325 MG PO TABS: 650.0000 mg | ORAL_TABLET | Freq: Four times a day (QID) | ORAL | Status: DC | PRN

## 2017-02-28 MED ORDER — TECHNETIUM TC 99M DIETHYLENETRIAME-PENTAACETIC ACID: 32.0000 | Freq: Once | INTRAVENOUS | Status: DC | PRN

## 2017-02-28 MED ORDER — SENNOSIDES-DOCUSATE SODIUM 8.6-50 MG PO TABS
2.0000 | ORAL_TABLET | Freq: Two times a day (BID) | ORAL | Status: DC
  Filled 2017-02-28 (×2): qty 2

## 2017-02-28 MED ORDER — PNEUMOCOCCAL VAC POLYVALENT 25 MCG/0.5ML IJ INJ
0.5000 mL | INJECTION | INTRAMUSCULAR | Status: AC
Start: 2017-03-01 — End: 2017-03-01
  Administered 2017-03-01: 0.5 mL via INTRAMUSCULAR
  Filled 2017-02-28: qty 0.5

## 2017-02-28 MED ORDER — ACETAMINOPHEN 325 MG PO TABS
650.0000 mg | ORAL_TABLET | Freq: Four times a day (QID) | ORAL | Status: DC
  Administered 2017-02-28 – 2017-03-01 (×4): 650 mg via ORAL
  Filled 2017-02-28 (×4): qty 2

## 2017-02-28 MED ORDER — ASPIRIN 81 MG PO CHEW
324.0000 mg | CHEWABLE_TABLET | Freq: Once | ORAL | Status: AC
  Administered 2017-02-28: 324 mg via ORAL
  Filled 2017-02-28: qty 4

## 2017-02-28 MED ORDER — SPIRONOLACTONE 25 MG PO TABS
25.0000 mg | ORAL_TABLET | Freq: Every day | ORAL | Status: DC
  Administered 2017-02-28 – 2017-03-01 (×2): 25 mg via ORAL
  Filled 2017-02-28 (×2): qty 1

## 2017-02-28 MED ORDER — CARVEDILOL 3.125 MG PO TABS
3.1250 mg | ORAL_TABLET | Freq: Two times a day (BID) | ORAL | Status: DC
  Administered 2017-02-28 – 2017-03-01 (×3): 3.125 mg via ORAL
  Filled 2017-02-28 (×3): qty 1

## 2017-02-28 MED ORDER — ASPIRIN 81 MG PO CHEW: 81.0000 mg | CHEWABLE_TABLET | Freq: Once | ORAL | Status: DC

## 2017-02-28 MED ORDER — ATORVASTATIN CALCIUM 40 MG PO TABS
40.0000 mg | ORAL_TABLET | Freq: Every day | ORAL | Status: DC
  Administered 2017-02-28: 40 mg via ORAL
  Filled 2017-02-28: qty 1

## 2017-02-28 MED ORDER — ACETAMINOPHEN 650 MG RE SUPP: 650.0000 mg | Freq: Four times a day (QID) | RECTAL | Status: DC | PRN

## 2017-02-28 MED ORDER — TECHNETIUM TO 99M ALBUMIN AGGREGATED
4.3000 | Freq: Once | INTRAVENOUS | Status: AC | PRN
  Administered 2017-02-28: 4.3 via INTRAVENOUS

## 2017-02-28 MED ORDER — MORPHINE SULFATE (PF) 4 MG/ML IV SOLN: 3.0000 mg | INTRAVENOUS | Status: DC | PRN

## 2017-02-28 MED ORDER — POTASSIUM CHLORIDE CRYS ER 20 MEQ PO TBCR
40.0000 meq | EXTENDED_RELEASE_TABLET | Freq: Once | ORAL | Status: AC
  Administered 2017-02-28: 40 meq via ORAL
  Filled 2017-02-28: qty 2

## 2017-02-28 NOTE — ED Provider Notes (Signed)
I called report to Dr Maudie Mercury Pt stable at this time    Ripley Fraise, MD 02/28/17 [phone]

## 2017-02-28 NOTE — Progress Notes (Signed)
02/27/2017  9:43 PM  02/28/2017 1:52 PM  Hannah Huerta was seen and examined.  The H&P by the admitting provider, orders, imaging was reviewed.  Please see new orders.  Will continue to follow.   Murvin Natal, MD [facility]

## 2017-02-28 NOTE — Progress Notes (Signed)
   02/28/17 1045  Clinical Encounter Type  Visited With Patient  Visit Type Other (Comment) ([place] consult)  Spiritual Encounters  Spiritual Needs Brochure  Stress Factors  Patient Stress Factors None identified  Introduction to Pt. Provided and explained AD. Follow up later.

## 2017-02-28 NOTE — Progress Notes (Signed)
   02/28/17 1315  Clinical Encounter Type  Visited With Patient and family together  Visit Type Follow-up  Spiritual Encounters  Spiritual Needs Emotional  Stress Factors  Patient Stress Factors None identified  Family Stress Factors None identified  Pt not ready to complete AD. Requested page when ready.

## 2017-02-28 NOTE — ED Notes (Signed)
REPAGED TO [facility] @ [phone]

## 2017-02-28 NOTE — H&P (Addendum)
TRH H&P   Patient Demographics:    Hannah Huerta, is a 56 y.o. female  MRN: [id number]   DOB - May 24, 1961  Admit Date - 02/27/2017  Outpatient Primary MD for the patient is Patient, No Pcp Per  Referring MD/NP/PA: Christy Gentles  Outpatient Specialists:   Patient coming from: home  Chief Complaint  Patient presents with  . Cough  . Emesis      HPI:    Hannah Huerta  is a 56 y.o. female, w CAD, CHF (EF 55-60%) apparently c/o chest pain left and right sided for the past 2 weeks worse today.  Better w lying down.  Denies fever, chills, cough, sob, n./v, diarrrhea, brbpr, black stool. Pt was not taking any medications at home due to having lost insurance.   In ED  K=2.8 Na 131, Bun/Creat 24/1.59  Gluose 137, sinus tach at 100, nl axis. Nl int, no st-t change c/w ischemia.     Pt will be admitted for w/up of CP, mild ARF    Review of systems:    In addition to the HPI above,  No Fever-chills, No Headache, No changes with Vision or hearing, No problems swallowing food or Liquids, No Cough or Shortness of Breath, No Abdominal pain, No Nausea or Vommitting, Bowel movements are regular, No Blood in stool or Urine, No dysuria, No new skin rashes or bruises, No new joints pains-aches,  No new weakness, tingling, numbness in any extremity, No recent weight gain or loss, No polyuria, polydypsia or polyphagia, No significant Mental Stressors.  A full 10 point Review of Systems was done, except as stated above, all other Review of Systems were negative.   With Past History of the following :    Past Medical History:  Diagnosis Date  . Abnormal Pap smear of cervix    pt couldnt state what type of abnormality  . Anemia   . CAD in native artery 06/05/2015  . Chest pain    a. 02/2012 abnl myoview - inferoapical reverisbility concerning for ischemia, EF 59%;   02/2012 nonobstructive  cath  . CHF (congestive heart failure) ([place])   . Chronic migraine   . GERD (gastroesophageal reflux disease)   . HLD (hyperlipidemia)   . Hypertension   . Hypokalemia 06/05/2015  . Metabolic syndrome 85/63/1497  . Morbid obesity with BMI of 50.0-59.9, adult ([place])   . Prediabetes   . S/P cardiac catheterization, non obstructive disease 06/04/15 06/05/2015  . Sickle cell trait ([place])   . Syncope       Past Surgical History:  Procedure Laterality Date  . BACK SURGERY     x1 lumbar fusion  . CARDIAC CATHETERIZATION N/A 06/04/2015   Procedure: Left Heart Cath and Coronary Angiography;  Surgeon: [name], MD;  Location: [facility];  Service: Cardiovascular;  Laterality: N/A;  . CHOLECYSTECTOMY     laparoscopic  .  COLONOSCOPY WITH PROPOFOL N/A 09/04/2014   Procedure: COLONOSCOPY WITH PROPOFOL;  Surgeon: [name], MD;  Location: [facility];  Service: Endoscopy;  Laterality: N/A;  . LEFT HEART CATHETERIZATION WITH CORONARY ANGIOGRAM N/A 03/08/2012   Procedure: LEFT HEART CATHETERIZATION WITH CORONARY ANGIOGRAM;  Surgeon: Hillary Bow, MD;  Location: [facility];  Service: Cardiovascular;  Laterality: N/A;  . SPINE SURGERY     fusion  . TUBAL LIGATION        Social History:     Social History  Substance Use Topics  . Smoking status: Never Smoker  . Smokeless tobacco: Never Used  . Alcohol use No     Lives - at home  Mobility - walks without assistance.    Family History :     Family History  Problem Relation Age of Onset  . Coronary artery disease Father 12  . Heart disease Father   . Other Father        died in a MVA  . Hypertension Father   . Sudden death Mother 2       Questionable MI  . Diabetes Mother   . Hypertension Mother   . Hypertension Sister   . Pancreatic cancer Maternal Aunt   . Breast cancer Maternal Aunt        after age 59  . Stroke Paternal Grandmother   . Colon polyps Sister   . Colon cancer Neg Hx   . Gallbladder  disease Neg Hx   . Esophageal cancer Neg Hx       Home Medications:   Prior to Admission medications   Medication Sig Start Date End Date Taking? Authorizing Provider  atorvastatin (LIPITOR) 40 MG tablet Take 1 tablet (40 mg total) by mouth daily at 6 PM. Patient not taking: Reported on 02/28/2017 07/25/16   Regalado, Jerald Kief A, MD  carvedilol (COREG) 6.25 MG tablet Take 0.5 tablets (3.125 mg total) by mouth 2 (two) times daily with a meal. Patient not taking: Reported on 02/28/2017 07/25/16   Regalado, Jerald Kief A, MD  spironolactone (ALDACTONE) 25 MG tablet Take 1 tablet (25 mg total) by mouth daily. Patient not taking: Reported on 02/28/2017 07/25/16   Niel Hummer A, MD     Allergies:    No Known Allergies   Physical Exam:   Vitals  Blood pressure (!) 139/123, pulse 91, temperature 99.3 F (37.4 C), temperature source Rectal, resp. rate 14, height 5\' 2"  (1.575 m), weight 136.1 kg (300 lb), last menstrual period 09/15/2013, SpO2 95 %.   1. General lying in bed in NAD,    2. Normal affect and insight, Not Suicidal or Homicidal, Awake Alert, Oriented X 3.  3. No F.N deficits, ALL C.Nerves Intact, Strength 5/5 all 4 extremities, Sensation intact all 4 extremities, Plantars down going.  4. Ears and Eyes appear Normal, Conjunctivae clear, PERRLA. Moist Oral Mucosa.  5. Supple Neck, No JVD, No cervical lymphadenopathy appriciated, No Carotid Bruits.  6. Symmetrical Chest wall movement, Good air movement bilaterally, CTAB.  7. RRR, No Gallops, Rubs or Murmurs, No Parasternal Heave.  8. Positive Bowel Sounds, Abdomen Soft, No tenderness, No organomegaly appriciated,No rebound -guarding or rigidity.  9.  No Cyanosis, Normal Skin Turgor, No Skin Rash or Bruise.  10. Good muscle tone,  joints appear normal , no effusions, Normal ROM.  11. No Palpable Lymph Nodes in Neck or Axillae     Data Review:    CBC  Recent Labs Lab 02/27/17 2125  WBC 7.0  HGB 12.7  HCT 37.7  PLT  323  MCV 76.5*  MCH 25.8*  MCHC 33.7  RDW 14.9  LYMPHSABS 2.4  MONOABS 0.8  EOSABS 0.2  BASOSABS 0.0   ------------------------------------------------------------------------------------------------------------------  Chemistries   Recent Labs Lab 02/27/17 2125  NA 131*  K 2.8*  CL 90*  CO2 28  GLUCOSE 137*  BUN 24*  CREATININE 1.59*  CALCIUM 9.4  AST 45*  ALT 21  ALKPHOS 80  BILITOT 0.5   ------------------------------------------------------------------------------------------------------------------ estimated creatinine clearance is 53.3 mL/min (A) (by C-G formula based on SCr of 1.59 mg/dL (H)). ------------------------------------------------------------------------------------------------------------------ No results for input(s): TSH, T4TOTAL, T3FREE, THYROIDAB in the last 72 hours.  Invalid input(s): FREET3  Coagulation profile No results for input(s): INR, PROTIME in the last 168 hours. -------------------------------------------------------------------------------------------------------------------  Recent Labs  02/27/17 2125  DDIMER 0.85*   -------------------------------------------------------------------------------------------------------------------  Cardiac Enzymes No results for input(s): CKMB, TROPONINI, MYOGLOBIN in the last 168 hours.  Invalid input(s): CK ------------------------------------------------------------------------------------------------------------------    Component Value Date/Time   BNP 4.8 02/27/2017 2125     ---------------------------------------------------------------------------------------------------------------  Urinalysis    Component Value Date/Time   COLORURINE YELLOW 07/23/2016 1823   APPEARANCEUR CLEAR 07/23/2016 1823   LABSPEC 1.010 07/23/2016 1823   PHURINE 5.0 07/23/2016 1823   GLUCOSEU NEGATIVE 07/23/2016 1823   HGBUR NEGATIVE 07/23/2016 1823   BILIRUBINUR NEGATIVE 07/23/2016 1823    KETONESUR NEGATIVE 07/23/2016 1823   PROTEINUR NEGATIVE 07/23/2016 1823   UROBILINOGEN 0.2 04/27/2014 1151   NITRITE NEGATIVE 07/23/2016 1823   LEUKOCYTESUR NEGATIVE 07/23/2016 1823    ----------------------------------------------------------------------------------------------------------------   Imaging Results:    Dg Chest 2 View  Result Date: 02/27/2017 CLINICAL DATA:  Nonproductive cough.  Shortness of breath. EXAM: CHEST  2 VIEW COMPARISON:  Radiographs and CT January 2018 FINDINGS: The cardiomediastinal contours are normal. The lungs are clear. Pulmonary vasculature is normal. No consolidation, pleural effusion, or pneumothorax. No acute osseous abnormalities are seen. IMPRESSION: No active cardiopulmonary disease. Electronically Signed   By: Jeb Levering M.D.   On: 02/27/2017 21:43   Ct Abdomen Pelvis W Contrast  Result Date: 02/27/2017 CLINICAL DATA:  Nausea and vomiting.  Lower abdominal cramping. EXAM: CT ABDOMEN AND PELVIS WITH CONTRAST TECHNIQUE: Multidetector CT imaging of the abdomen and pelvis was performed using the standard protocol following bolus administration of intravenous contrast. CONTRAST:  70mL ISOVUE-300 IOPAMIDOL (ISOVUE-300) INJECTION 61% COMPARISON:  Remote CT 06/21/2006 FINDINGS: Lower chest: The lung bases are clear. Hepatobiliary: The liver is prominent size spanning 18 cm cranial caudal. Decreased hepatic density consistent with steatosis. 9 mm low-density lesion in the liver on prior CT is not seen currently. Clips in the gallbladder fossa postcholecystectomy. No biliary dilatation. Pancreas: No ductal dilatation or inflammation. Spleen: Normal in size without focal abnormality. Adrenals/Urinary Tract: No adrenal nodule. No hydronephrosis. No perinephric edema. There is an 11 mm fat density lesion in the upper left kidney suggestive of angiomyolipoma versus cortical scarring. No suspicious renal lesion. Homogeneous renal enhancement. Only minimal excretion on  delayed phase imaging. Urinary bladder is physiologically distended. No bladder wall thickening. Stomach/Bowel: Stomach is within normal limits. Appendix appears normal. No evidence of bowel wall thickening, distention, or inflammatory changes. Vascular/Lymphatic: No significant vascular findings are present. No enlarged abdominal or pelvic lymph nodes. Reproductive: Uterus and bilateral adnexa are unremarkable. Other: No free air, free fluid, or intra-abdominal fluid collection. Small fat containing umbilical hernia. Musculoskeletal: Posterior lumbar fusion at L4-L5. Scattered degenerative disc disease elsewhere in the spine. IMPRESSION: 1. No acute abnormality or  explanation for abdominal pain. 2. Hepatomegaly and hepatic steatosis. Electronically Signed   By: Jeb Levering M.D.   On: 02/27/2017 23:51      Assessment & Plan:    Principal Problem:   Chest pain, neg MI, possible GI Active Problems:   Hypokalemia   ARF (acute renal failure) (HCC)   Hyponatremia    Chest pain Tele Trop I q6h x3 Check Lipid VQ scan due to positive d dimer Nuclear stress test Start Aspirin, Lipitor, Carvedilol Cardiology consult  Hypokalemia Replete  Hyponatremia Unclear cause Check serum osm, tsh, cortisol, urine sodium, urine osm Hydrate with Ns iv  Hypertension uncontrolled Restart spironolactone Check cmp in am  Mild ARF (CT abd/pelvis=> negative for hydro) Check urine sodium, urine creatinine, urine eosinophils Gentle hydration    DVT Prophylaxis Lovenox - SCDs   AM Labs Ordered, also please review Full Orders  Family Communication: Admission, patients condition and plan of care including tests being ordered have been discussed with the patient  who indicate understanding and agree with the plan and Code Status.  Code Status FULL CODE  Likely DC to  home  Condition GUARDED    Consults called:   Admission status: obs  Time spent in minutes : 45    Jani Gravel M.D on  02/28/2017 at 1:53 AM  Between 7am to 7pm - Pager - [phone]. After 7pm go to [email or web address] - password [email or web address]  [facility] - Office  [phone]

## 2017-03-01 DIAGNOSIS — G4733 Obstructive sleep apnea (adult) (pediatric): Secondary | ICD-10-CM | POA: Diagnosis present

## 2017-03-01 LAB — CBC WITH DIFFERENTIAL/PLATELET
Basophils Absolute: 0 10*3/uL (ref 0.0–0.1)
Basophils Relative: 0 %
Eosinophils Absolute: 0.3 10*3/uL (ref 0.0–0.7)
Eosinophils Relative: 4 %
HCT: 34.4 % — ABNORMAL LOW (ref 36.0–46.0)
Hemoglobin: 11.2 g/dL — ABNORMAL LOW (ref 12.0–15.0)
Lymphocytes Relative: 27 %
Lymphs Abs: 1.9 10*3/uL (ref 0.7–4.0)
MCH: 25 pg — ABNORMAL LOW (ref 26.0–34.0)
MCHC: 32.6 g/dL (ref 30.0–36.0)
MCV: 76.8 fL — ABNORMAL LOW (ref 78.0–100.0)
Monocytes Absolute: 1.2 10*3/uL — ABNORMAL HIGH (ref 0.1–1.0)
Monocytes Relative: 18 %
Neutro Abs: 3.4 10*3/uL (ref 1.7–7.7)
Neutrophils Relative %: 51 %
Platelets: 333 10*3/uL (ref 150–400)
RBC: 4.48 MIL/uL (ref 3.87–5.11)
RDW: 14.6 % (ref 11.5–15.5)
WBC: 6.8 10*3/uL (ref 4.0–10.5)

## 2017-03-01 LAB — BASIC METABOLIC PANEL
Anion gap: 8 (ref 5–15)
BUN: 16 mg/dL (ref 6–20)
CO2: 32 mmol/L (ref 22–32)
Calcium: 9 mg/dL (ref 8.9–10.3)
Chloride: 96 mmol/L — ABNORMAL LOW (ref 101–111)
Creatinine, Ser: 1.16 mg/dL — ABNORMAL HIGH (ref 0.44–1.00)
GFR calc Af Amer: >60 mL/min (ref >60)
GFR calc non Af Amer: 52 mL/min — ABNORMAL LOW (ref >60)
Glucose, Bld: 123 mg/dL — ABNORMAL HIGH (ref 65–99)
Potassium: 3.9 mmol/L (ref 3.5–5.1)
Sodium: 136 mmol/L (ref 135–145)

## 2017-03-01 LAB — CALCIUM / CREATININE RATIO, URINE
Calcium, Ur: 1.1 mg/dL
Calcium/Creat.Ratio: 11 mg/g creat (ref 0–260)
Creatinine, Urine: 100.9 mg/dL

## 2017-03-01 LAB — C-REACTIVE PROTEIN: CRP: 5.2 mg/dL — ABNORMAL HIGH (ref <1.0)

## 2017-03-01 LAB — SEDIMENTATION RATE: Sed Rate: 67 mm/hr — ABNORMAL HIGH (ref 0–22)

## 2017-03-01 MED ORDER — DOXYCYCLINE HYCLATE 100 MG PO CAPS: 100.0000 mg | ORAL_CAPSULE | Freq: Two times a day (BID) | ORAL | 0 refills | Status: DC

## 2017-03-01 MED ORDER — SPIRONOLACTONE 25 MG PO TABS: 12.5000 mg | ORAL_TABLET | Freq: Every day | ORAL | 0 refills | Status: DC

## 2017-03-01 MED ORDER — ATORVASTATIN CALCIUM 40 MG PO TABS: 40.0000 mg | ORAL_TABLET | Freq: Every day | ORAL | 0 refills | Status: DC

## 2017-03-01 MED ORDER — CARVEDILOL 3.125 MG PO TABS: 3.1250 mg | ORAL_TABLET | Freq: Two times a day (BID) | ORAL | 0 refills | Status: DC

## 2017-03-01 NOTE — Discharge Instructions (Signed)
Follow with Primary MD  Patient, No Pcp Per  and other consultant's as instructed your Hospitalist MD ° °Please get a complete blood count and chemistry panel checked by your Primary MD at your next visit, and again as instructed by your Primary MD. ° °Get Medicines reviewed and adjusted: °Please take all your medications with you for your next visit with your Primary MD ° °Laboratory/radiological data: °Please request your Primary MD to go over all hospital tests and procedure/radiological results at the follow up, please ask your Primary MD to get all Hospital records sent to his/her office. ° °In some cases, they will be blood work, cultures and biopsy results pending at the time of your discharge. Please request that your primary care M.D. follows up on these results. ° °Also Note the following: °If you experience worsening of your admission symptoms, develop shortness of breath, life threatening emergency, suicidal or homicidal thoughts you must seek medical attention immediately by calling 911 or calling your MD immediately  if symptoms less severe. ° °You must read complete instructions/literature along with all the possible adverse reactions/side effects for all the Medicines you take and that have been prescribed to you. Take any new Medicines after you have completely understood and accpet all the possible adverse reactions/side effects.  ° °Do not drive when taking Pain medications or sleeping medications (Benzodaizepines) ° °Do not take more than prescribed Pain, Sleep and Anxiety Medications. It is not advisable to combine anxiety,sleep and pain medications without talking with your primary care practitioner ° °Special Instructions: If you have smoked or chewed Tobacco  in the last 2 yrs please stop smoking, stop any regular Alcohol  and or any Recreational drug use. ° °Wear Seat belts while driving. ° °Please note: °You were cared for by a hospitalist during your hospital stay. Once you are discharged,  your primary care physician will handle any further medical issues. Please note that NO REFILLS for any discharge medications will be authorized once you are discharged, as it is imperative that you return to your primary care physician (or establish a relationship with a primary care physician if you do not have one) for your post hospital discharge needs so that they can reassess your need for medications and monitor your lab values. ° ° ° ° °

## 2017-03-01 NOTE — Discharge Summary (Addendum)
Physician Discharge Summary  Hannah Huerta KGM:[id number] DOB: [birth year] DOA: 02/27/2017  PCP: Patient, No Pcp Per  Admit date: 02/27/2017 Discharge date: 03/01/2017  Admitted From: HOME  Disposition: HOME   Recommendations for Outpatient Follow-up:  1. Follow up with PCP in 1 weeks 2. Please obtain BMP/CBC in one week  Discharge Condition: STABLE   CODE STATUS: FULL    Brief Hospitalization Summary: Please see all hospital notes, images, labs for full details of the hospitalization.  Hannah Huerta  is a 56 y.o. female, w CAD, CHF (EF 55-60%) apparently c/o chest pain left and right sided for the past 2 weeks worse today.  Better w lying down.  Denies fever, chills, cough, sob, n./v, diarrrhea, brbpr, black stool. Pt was not taking any medications at home due to having lost insurance.   In ED  K=2.8 Na 131, Bun/Creat 24/1.59  Gluose 137, sinus tach at 100, nl axis. Nl int, no st-t change c/w ischemia.     Pt will be admitted for w/up of CP, mild ARF  Chest pain - pleuritic in nature, reproducible with palpation, treated with scheduled tylenol and treating the cough, she needs to get a sleep study done, v/q scan was negative for acute PE.     Hypokalemia Repleted  Hyponatremia Unclear cause Check serum osm, tsh, cortisol, urine sodium, urine osm Hydrate with Ns IV with improvement.   Hypertension uncontrolled Restarted home spironolactone, follow up with PCP for recheck  Mild ARF (CT abd/pelvis=> negative for hydro) Improved with hydration  Acute bronchitis - doxycycline ordered.    Discharge Diagnoses:  Principal Problem:   Chest pain, neg MI, possible GI Active Problems:   Hypokalemia   ARF (acute renal failure) (HCC)   Hyponatremia   OSA (obstructive sleep apnea)  Discharge Instructions: Discharge Instructions    Ambulatory referral to Cardiology    Complete by:  As directed    Pt needs sleep study   Call MD for:  difficulty breathing, headache or visual  disturbances    Complete by:  As directed    Call MD for:  hives    Complete by:  As directed    Call MD for:  persistant dizziness or light-headedness    Complete by:  As directed    Call MD for:  persistant nausea and vomiting    Complete by:  As directed    Call MD for:  severe uncontrolled pain    Complete by:  As directed    Call MD for:  temperature >100.4    Complete by:  As directed    Diet - low sodium heart healthy    Complete by:  As directed    Increase activity slowly    Complete by:  As directed      Allergies as of 03/01/2017   No Known Allergies     Medication List    TAKE these medications   atorvastatin 40 MG tablet Commonly known as:  LIPITOR Take 1 tablet (40 mg total) by mouth daily at 6 PM.   carvedilol 3.125 MG tablet Commonly known as:  COREG Take 1 tablet (3.125 mg total) by mouth 2 (two) times daily with a meal. What changed:  medication strength   doxycycline 100 MG capsule Commonly known as:  VIBRAMYCIN Take 1 capsule (100 mg total) by mouth 2 (two) times daily.   spironolactone 25 MG tablet Commonly known as:  ALDACTONE Take 0.5 tablets (12.5 mg total) by mouth daily. What changed:  how  much to take      [place] Follow up on 03/07/2017.   Why:  Hospital Follow Up With [facility] @ 9:30 am.  Contact information: [place] [address]       [place] Follow up.   Why:  Pt can use this location for medications- Cost will range betwen $4.00-$10.00 Pharmacy Open M-F-8:30-4:15 pm.  Contact information: [place] [phone]         No Known Allergies Current Discharge Medication List    START taking these medications   Details  doxycycline (VIBRAMYCIN) 100 MG capsule Take 1 capsule (100 mg total) by mouth 2 (two) times daily. Qty: 14 capsule, Refills: 0       CONTINUE these medications which have CHANGED   Details  atorvastatin (LIPITOR) 40 MG tablet Take 1 tablet (40 mg total) by mouth daily at 6 PM. Qty: 30 tablet, Refills: 0    carvedilol (COREG) 3.125 MG tablet Take 1 tablet (3.125 mg total) by mouth 2 (two) times daily with a meal. Qty: 60 tablet, Refills: 0    spironolactone (ALDACTONE) 25 MG tablet Take 0.5 tablets (12.5 mg total) by mouth daily. Qty: 15 tablet, Refills: 0        Procedures/Studies: Dg Chest 2 View  Result Date: 02/27/2017 CLINICAL DATA:  Nonproductive cough.  Shortness of breath. EXAM: CHEST  2 VIEW COMPARISON:  Radiographs and CT January 2018 FINDINGS: The cardiomediastinal contours are normal. The lungs are clear. Pulmonary vasculature is normal. No consolidation, pleural effusion, or pneumothorax. No acute osseous abnormalities are seen. IMPRESSION: No active cardiopulmonary disease. Electronically Signed   By: Jeb Levering M.D.   On: 02/27/2017 21:43   Ct Abdomen Pelvis W Contrast  Result Date: 02/27/2017 CLINICAL DATA:  Nausea and vomiting.  Lower abdominal cramping. EXAM: CT ABDOMEN AND PELVIS WITH CONTRAST TECHNIQUE: Multidetector CT imaging of the abdomen and pelvis was performed using the standard protocol following bolus administration of intravenous contrast. CONTRAST:  68mL ISOVUE-300 IOPAMIDOL (ISOVUE-300) INJECTION 61% COMPARISON:  Remote CT 06/21/2006 FINDINGS: Lower chest: The lung bases are clear. Hepatobiliary: The liver is prominent size spanning 18 cm cranial caudal. Decreased hepatic density consistent with steatosis. 9 mm low-density lesion in the liver on prior CT is not seen currently. Clips in the gallbladder fossa postcholecystectomy. No biliary dilatation. Pancreas: No ductal dilatation or inflammation. Spleen: Normal in size without focal abnormality. Adrenals/Urinary Tract: No adrenal nodule. No hydronephrosis. No perinephric edema. There is an 11 mm fat density lesion in the upper left  kidney suggestive of angiomyolipoma versus cortical scarring. No suspicious renal lesion. Homogeneous renal enhancement. Only minimal excretion on delayed phase imaging. Urinary bladder is physiologically distended. No bladder wall thickening. Stomach/Bowel: Stomach is within normal limits. Appendix appears normal. No evidence of bowel wall thickening, distention, or inflammatory changes. Vascular/Lymphatic: No significant vascular findings are present. No enlarged abdominal or pelvic lymph nodes. Reproductive: Uterus and bilateral adnexa are unremarkable. Other: No free air, free fluid, or intra-abdominal fluid collection. Small fat containing umbilical hernia. Musculoskeletal: Posterior lumbar fusion at L4-L5. Scattered degenerative disc disease elsewhere in the spine. IMPRESSION: 1. No acute abnormality or explanation for abdominal pain. 2. Hepatomegaly and hepatic steatosis. Electronically Signed   By: Jeb Levering M.D.   On: 02/27/2017 23:51   Nm Pulmonary Perf And Vent  Result Date: 02/28/2017 CLINICAL DATA:  Chest pain.  EXAM: NUCLEAR MEDICINE VENTILATION - PERFUSION LUNG SCAN TECHNIQUE: Ventilation images were obtained in multiple projections using inhaled aerosol Tc-56m DTPA. Perfusion images were obtained in multiple projections after intravenous injection of Tc-13m MAA. RADIOPHARMACEUTICALS:  32.0 mCi Technetium-76m DTPA aerosol inhalation and 4.3 mCi Technetium-22m MAA IV COMPARISON:  CT abdomen 02/27/2017. Chest x-ray 02/27/2017. Chest CT 07/24/2016. FINDINGS: Poor ventilation with good perfusion bilaterally. Small perfusion defect noted over the right lung, smaller than on ventilation scan. Low probability pulmonary embolus. IMPRESSION: Low probability pulmonary embolus. Electronically Signed   By: Marcello Moores  Register   On: 02/28/2017 17:07     Subjective: Pt says she is feeling a lot better, she is breathing better.  Still occasional SOB.  No chest pain.   Discharge Exam: Vitals:    03/01/17 0513 03/01/17 1424  BP: 130/72 (!) 150/77  Pulse: 80 85  Resp: 17 18  Temp: 99 F (37.2 C) 98.4 F (36.9 C)  SpO2: 95% 100%   Vitals:   02/28/17 1704 02/28/17 1941 03/01/17 0513 03/01/17 1424  BP:  128/67 130/72 (!) 150/77  Pulse:  85 80 85  Resp:  18 17 18   Temp: 99.7 F (37.6 C) 99.2 F (37.3 C) 99 F (37.2 C) 98.4 F (36.9 C)  TempSrc:  Oral Oral Oral  SpO2:  93% 95% 100%  Weight:   (!) 143.3 kg (316 lb)   Height:       General: Pt is alert, awake, not in acute distress Cardiovascular: RRR, S1/S2 +, no rubs, no gallops Respiratory: CTA bilaterally, no wheezing, no rhonchi Abdominal: Soft, NT, ND, bowel sounds + Extremities: no edema, no cyanosis   The results of significant diagnostics from this hospitalization (including imaging, microbiology, ancillary and laboratory) are listed below for reference.     Microbiology: No results found for this or any previous visit (from the past 240 hour(s)).   Labs: BNP (last 3 results)  Recent Labs  07/23/16 1147 02/27/17 2125  BNP 23.8 4.8   Basic Metabolic Panel:  Recent Labs Lab 02/27/17 2125 02/28/17 0348 03/01/17 0326  NA 131* 137 136  K 2.8* 3.3* 3.9  CL 90* 94* 96*  CO2 28 31 32  GLUCOSE 137* 133* 123*  BUN 24* 21* 16  CREATININE 1.59* 1.47* 1.16*  CALCIUM 9.4 9.1 9.0   Liver Function Tests:  Recent Labs Lab 02/27/17 2125 02/28/17 0348  AST 45* 52*  ALT 21 20  ALKPHOS 80 68  BILITOT 0.5 0.6  PROT 8.4* 7.5  ALBUMIN 3.7 3.3*    Recent Labs Lab 02/27/17 2125  LIPASE 34   No results for input(s): AMMONIA in the last 168 hours. CBC:  Recent Labs Lab 02/27/17 2125 02/28/17 0348 03/01/17 0326  WBC 7.0 6.5 6.8  NEUTROABS 3.6  --  3.4  HGB 12.7 11.3* 11.2*  HCT 37.7 34.3* 34.4*  MCV 76.5* 75.7* 76.8*  PLT 323 335 333   Cardiac Enzymes:  Recent Labs Lab 02/28/17 0714 02/28/17 1059 02/28/17 1803  TROPONINI <0.03 <0.03 <0.03   BNP: Invalid input(s): POCBNP CBG: No  results for input(s): GLUCAP in the last 168 hours. D-Dimer  Recent Labs  02/27/17 2125  DDIMER 0.85*   Hgb A1c No results for input(s): HGBA1C in the last 72 hours. Lipid Profile No results for input(s): CHOL, HDL, LDLCALC, TRIG, CHOLHDL, LDLDIRECT in the last 72 hours. Thyroid function studies  Recent Labs  02/28/17 0714  TSH 3.153   Anemia work up No results for input(s): VITAMINB12, FOLATE, FERRITIN,  TIBC, IRON, RETICCTPCT in the last 72 hours. Urinalysis    Component Value Date/Time   COLORURINE YELLOW 02/28/2017 0310   APPEARANCEUR CLEAR 02/28/2017 0310   LABSPEC 1.030 02/28/2017 0310   PHURINE 5.0 02/28/2017 0310   GLUCOSEU NEGATIVE 02/28/2017 0310   HGBUR SMALL (A) 02/28/2017 0310   BILIRUBINUR NEGATIVE 02/28/2017 0310   KETONESUR NEGATIVE 02/28/2017 0310   PROTEINUR NEGATIVE 02/28/2017 0310   UROBILINOGEN 0.2 04/27/2014 1151   NITRITE NEGATIVE 02/28/2017 0310   LEUKOCYTESUR NEGATIVE 02/28/2017 0310   Sepsis Labs Invalid input(s): PROCALCITONIN,  WBC,  LACTICIDVEN Microbiology No results found for this or any previous visit (from the past 240 hour(s)).  Time coordinating discharge: 32 mins  SIGNED:  Irwin Brakeman, MD  [facility] 03/01/2017, 2:40 PM Pager [phone]  If 7PM-7AM, please contact night-coverage [email or web address] Password [email or web address]

## 2017-03-01 NOTE — Care Management Note (Signed)
Case Management Note  Patient Details  Name: BRYONNA SUNDBY MRN: [id number] Date of Birth: 19-Feb-1961  Subjective/Objective: Pt presented for Chest Pain. Pt is currently without a PCP- or insurance. [employer] is following. Pt is from home with husband that is disabled.                    Action/Plan: CM did schedule patient a hospital f/u appointment. Appointment to be placed on the AVS. Pt will be able to utilize the [facility] and [facility] and medications will range in cost from $4.00-$10.00. No further needs from CM at this time.   Expected Discharge Date:                  Expected Discharge Plan:  Home/Self Care  In-House Referral:  NA  Discharge planning Services  CM Consult, Follow-up appt scheduled, [facility], Medication Assistance  Post Acute Care Choice:  NA Choice offered to:  NA  DME Arranged:  N/A DME Agency:  NA  HH Arranged:  NA HH Agency:  NA  Status of Service:  Completed, signed off  If discussed at [place] of Stay Meetings, dates discussed:    Additional Comments:  Bethena Roys, RN 03/01/2017, 11:32 AM

## 2017-03-07 ENCOUNTER — Ambulatory Visit (INDEPENDENT_AMBULATORY_CARE_PROVIDER_SITE_OTHER): Payer: Self-pay | Admitting: Family Medicine

## 2017-03-07 ENCOUNTER — Encounter: Payer: Self-pay | Admitting: Family Medicine

## 2017-03-07 VITALS — BP 139/75 | HR 92 | Temp 98.5°F | Resp 18 | Ht 62.0 in | Wt 323.0 lb

## 2017-03-07 DIAGNOSIS — E8881 Metabolic syndrome: Secondary | ICD-10-CM

## 2017-03-07 DIAGNOSIS — R7303 Prediabetes: Secondary | ICD-10-CM

## 2017-03-07 DIAGNOSIS — I10 Essential (primary) hypertension: Secondary | ICD-10-CM

## 2017-03-07 DIAGNOSIS — E784 Other hyperlipidemia: Secondary | ICD-10-CM

## 2017-03-07 DIAGNOSIS — Z1159 Encounter for screening for other viral diseases: Secondary | ICD-10-CM

## 2017-03-07 DIAGNOSIS — E7849 Other hyperlipidemia: Secondary | ICD-10-CM

## 2017-03-07 LAB — POCT URINALYSIS DIP (DEVICE)
Bilirubin Urine: NEGATIVE
Glucose, UA: NEGATIVE mg/dL
Ketones, ur: NEGATIVE mg/dL
Leukocytes, UA: NEGATIVE
Nitrite: NEGATIVE
Protein, ur: 30 mg/dL — AB
Specific Gravity, Urine: 1.015 (ref 1.005–1.030)
Urobilinogen, UA: 0.2 mg/dL (ref 0.0–1.0)
pH: 5.5 (ref 5.0–8.0)

## 2017-03-07 LAB — POCT GLYCOSYLATED HEMOGLOBIN (HGB A1C): Hemoglobin A1C: 6.4

## 2017-03-07 MED ORDER — ASPIRIN EC 81 MG PO TBEC: 81.0000 mg | DELAYED_RELEASE_TABLET | Freq: Every day | ORAL | 11 refills | Status: AC

## 2017-03-07 MED ORDER — METFORMIN HCL 500 MG PO TABS: 500.0000 mg | ORAL_TABLET | Freq: Every day | ORAL | 5 refills | Status: DC

## 2017-03-07 MED ORDER — CARVEDILOL 3.125 MG PO TABS: 3.1250 mg | ORAL_TABLET | Freq: Two times a day (BID) | ORAL | 5 refills | Status: DC

## 2017-03-07 MED ORDER — SPIRONOLACTONE 25 MG PO TABS: 12.5000 mg | ORAL_TABLET | Freq: Every day | ORAL | 5 refills | Status: DC

## 2017-03-07 MED ORDER — ATORVASTATIN CALCIUM 40 MG PO TABS: 40.0000 mg | ORAL_TABLET | Freq: Every day | ORAL | 11 refills | Status: DC

## 2017-03-07 MED FILL — ?METFORMIN HCL 500MG TABLET: 500 | 30 days supply | Qty: 30 | Fill #0

## 2017-03-07 MED FILL — ?CARVEDILOL 3.125 MG TABLET: 3.125 | 30 days supply | Qty: 60 | Fill #0

## 2017-03-07 NOTE — Progress Notes (Signed)
Subjective:    Patient ID: Hannah Huerta, female    DOB: 17-Jun-1961, 56 y.o.   MRN: [id number]  HPI Hannah Huerta, a 56 year old female presents to establish care. She has not had a primary care provider due to insurance constraints. She has a history of CHF, CAD, hypertension, and morbid obesity. She was admitted to inpatient services on 02/28/2017 with uncontrolled hypertension and acute renal failure. She was discharged home on antihypertensive medications.  She does not follow and exercise routine and is not adherent to a lowfat, low sodium diet. She endorses bilateral lower extremity edema. She denies chest pain, dyspnea, near-syncope, palpitations and tachypnea.  Cardiovascular risk factors: obesity (BMI >= 30 kg/m2), sedentary lifestyle and smoking/ tobacco exposure.   Social History   Social History  . Marital status: Married    Spouse name: N/A  . Number of children: 3  . Years of education: N/A   Occupational History  . Works in the [employer]  . Smoking status: Never Smoker  . Smokeless tobacco: Never Used  . Alcohol use No  . Drug use: No  . Sexual activity: Yes    Birth control/ protection: Surgical   Other Topics Concern  . Not on file   Social History Narrative   Lives at home with husband and two of her sons.      Immunization History  Administered Date(s) Administered  . Influenza,inj,Quad PF,6+ Mos 04/28/2014, 07/25/2016  . Pneumococcal Polysaccharide-23 04/28/2014, 03/01/2017  . Tdap 09/15/2012  The ASCVD Risk score Mikey Bussing DC Brooke Bonito., et al., 2013) failed to calculate for the following reasons:   Cannot find a previous HDL lab   Cannot find a previous total cholesterol lab  Review of Systems  Constitutional: Positive for fatigue and unexpected weight change (Weight gain). Negative for fever.  Eyes: Negative.   Respiratory: Positive for shortness of breath.   Cardiovascular: Positive for leg swelling. Negative for chest pain  and palpitations.  Gastrointestinal: Negative.   Endocrine: Negative for polydipsia, polyphagia and polyuria.  Genitourinary: Negative.   Musculoskeletal: Negative.   Skin: Negative.   Neurological: Negative.   Hematological: Negative.   Psychiatric/Behavioral: Negative.        Objective:   Physical Exam  Constitutional: She is oriented to person, place, and time.  HENT:  Head: Normocephalic and atraumatic.  Right Ear: External ear normal.  Left Ear: External ear normal.  Nose: Nose normal.  Mouth/Throat: Oropharynx is clear and moist.  Eyes: Pupils are equal, round, and reactive to light.  Neck: Normal range of motion.  Cardiovascular: Normal rate, regular rhythm, normal heart sounds and intact distal pulses.   Pulses:      Carotid pulses are 2+ on the right side, and 2+ on the left side.      Radial pulses are 2+ on the right side, and 2+ on the left side.       Femoral pulses are 2+ on the right side, and 2+ on the left side.      Popliteal pulses are 2+ on the right side, and 2+ on the left side.       Dorsalis pedis pulses are 2+ on the right side, and 2+ on the left side.       Posterior tibial pulses are 2+ on the right side, and 2+ on the left side.  2+ lower extremity edema  Pulmonary/Chest: Effort normal and breath sounds normal.  Abdominal:  Increased abdominal obesity  Musculoskeletal: Normal range of motion.  Neurological: She is alert and oriented to person, place, and time.      BP 139/75 (BP Location: Left Arm, Patient Position: Sitting, Cuff Size: Large)   Pulse 92   Temp 98.5 F (36.9 C) (Oral)   Resp 18   Ht 5\' 2"  (1.575 m)   Wt (!) 323 lb (146.5 kg)   LMP 09/15/2013   SpO2 98%   BMI 59.08 kg/m  Assessment & Plan:  1. Essential hypertension Blood pressure is at goal on current medication regimen No medication changes warranted  Will review renal functioning as results become available - Continue medication, monitor blood pressure at home.  Continue DASH diet. Reminder to report to the ER if any CP, SOB, nausea, dizziness, severe HA, changes vision/speech, left arm numbness and tingling and jaw pain. - carvedilol (COREG) 3.125 MG tablet; Take 1 tablet (3.125 mg total) by mouth 2 (two) times daily with a meal.  Dispense: 60 tablet; Refill: 5 - spironolactone (ALDACTONE) 25 MG tablet; Take 0.5 tablets (12.5 mg total) by mouth daily.  Dispense: 30 tablet; Refill: 5 - COMPLETE METABOLIC PANEL WITH GFR - Lipid Panel - Microalbumin/Creatinine Ratio, Urine - POCT urinalysis dip (device)  2. Prediabetes - HgB A1c - metFORMIN (GLUCOPHAGE) 500 MG tablet; Take 1 tablet (500 mg total) by mouth daily with breakfast.  Dispense: 30 tablet; Refill: 5 - Microalbumin/Creatinine Ratio, Urine - POCT urinalysis dip (device)  3. Other hyperlipidemia The 10-year ASCVD risk score Mikey Bussing DC Jr., et al., 2013) is: 17.4%   Values used to calculate the score:     Age: 54 years     Sex: Female     Is Non-Hispanic African American: Yes     Diabetic: Yes     Tobacco smoker: No     Systolic Blood Pressure: 322 mmHg     Is BP treated: Yes     HDL Cholesterol: 43 mg/dL     Total Cholesterol: 185 mg/dL  - atorvastatin (LIPITOR) 40 MG tablet; Take 1 tablet (40 mg total) by mouth daily at 6 PM.  Dispense: 30 tablet; Refill: 11 - aspirin EC 81 MG tablet; Take 1 tablet (81 mg total) by mouth daily.  Dispense: 30 tablet; Refill: 11  4. Metabolic syndrome Discussed diet and exercise at length. The patient is asked to make an attempt to improve diet and exercise patterns to aid in medical management of this problem. Recommend a lowfat, low carbohydrate diet divided over 5-6 small meals, increase water intake to 6-8 glasses, and 150 minutes per week of cardiovascular exercise.    - POCT urinalysis dip (device)  5. Need for hepatitis C screening test - Hepatitis C Antibody   RTC: 1 month for hypertension and prediabetes   Donia Pounds  MSN,  FNP-C [facility] [address] [place], [place] [phone]

## 2017-03-07 NOTE — Patient Instructions (Addendum)
[facility] [phone] Gabriel Cirri [place]) for mammogram Prediabetes Eating Plan Prediabetes-also called impaired glucose tolerance or impaired fasting glucose-is a condition that causes blood sugar (blood glucose) levels to be higher than normal. Following a healthy diet can help to keep prediabetes under control. It can also help to lower the risk of type 2 diabetes and heart disease, which are increased in people who have prediabetes. Along with regular exercise, a healthy diet:  Promotes weight loss.  Helps to control blood sugar levels.  Helps to improve the way that the body uses insulin.  What do I need to know about this eating plan?  Use the glycemic index (GI) to plan your meals. The index tells you how quickly a food will raise your blood sugar. Choose low-GI foods. These foods take a longer time to raise blood sugar.  Pay close attention to the amount of carbohydrates in the food that you eat. Carbohydrates increase blood sugar levels.  Keep track of how many calories you take in. Eating the right amount of calories will help you to achieve a healthy weight. Losing about 7 percent of your starting weight can help to prevent type 2 diabetes.  You may want to follow a Mediterranean diet. This diet includes a lot of vegetables, lean meats or fish, whole grains, fruits, and healthy oils and fats. What foods can I eat? Grains Whole grains, such as whole-wheat or whole-grain breads, crackers, cereals, and pasta. Unsweetened oatmeal. Bulgur. Barley. Quinoa. Brown rice. Corn or whole-wheat flour tortillas or taco shells. Vegetables Lettuce. Spinach. Peas. Beets. Cauliflower. Cabbage. Broccoli. Carrots. Tomatoes. Squash. Eggplant. Herbs. Peppers. Onions. Cucumbers. Brussels sprouts. Fruits Berries. Bananas. Apples. Oranges. Grapes. Papaya. Mango. Pomegranate. Kiwi. Grapefruit. Cherries. Meats and Other Protein Sources Seafood. Lean meats, such as chicken and [place] or lean cuts of  pork and beef. Tofu. Eggs. Nuts. Beans. Dairy Low-fat or fat-free dairy products, such as yogurt, cottage cheese, and cheese. Beverages Water. Tea. Coffee. Sugar-free or diet soda. Seltzer water. Milk. Milk alternatives, such as soy or almond milk. Condiments Mustard. Relish. Low-fat, low-sugar ketchup. Low-fat, low-sugar barbecue sauce. Low-fat or fat-free mayonnaise. Sweets and Desserts Sugar-free or low-fat pudding. Sugar-free or low-fat ice cream and other frozen treats. Fats and Oils Avocado. Walnuts. Olive oil. The items listed above may not be a complete list of recommended foods or beverages. Contact your dietitian for more options. What foods are not recommended? Grains Refined white flour and flour products, such as bread, pasta, snack foods, and cereals. Beverages Sweetened drinks, such as sweet iced tea and soda. Sweets and Desserts Baked goods, such as cake, cupcakes, pastries, cookies, and cheesecake. The items listed above may not be a complete list of foods and beverages to avoid. Contact your dietitian for more information. This information is not intended to replace advice given to you by your health care provider. Make sure you discuss any questions you have with your health care provider. Document Released: 11/17/2014 Document Revised: 12/09/2015 Document Reviewed: 07/29/2014 Elsevier Interactive Patient Education  2017 Elsevier Inc.  Prediabetes Prediabetes is the condition of having a blood sugar (blood glucose) level that is higher than it should be, but not high enough for you to be diagnosed with type 2 diabetes. Having prediabetes puts you at risk for developing type 2 diabetes (type 2 diabetes mellitus). Prediabetes may be called impaired glucose tolerance or impaired fasting glucose. Prediabetes usually does not cause symptoms. Your health care provider can diagnose this condition with blood tests. You may be tested  for prediabetes if you are overweight and if  you have at least one other risk factor for prediabetes. Risk factors for prediabetes include:  Having a family member with type 2 diabetes.  Being overweight or obese.  Being older than age 80.  Being of American-Indian, African-American, Hispanic/Latino, or Asian/Pacific Islander descent.  Having an inactive (sedentary) lifestyle.  Having a history of gestational diabetes or polycystic ovarian syndrome (PCOS).  Having low levels of good cholesterol (HDL-C) or high levels of blood fats (triglycerides).  Having high blood pressure.  What is blood glucose and how is blood glucose measured?  Blood glucose refers to the amount of glucose in your bloodstream. Glucose comes from eating foods that contain sugars and starches (carbohydrates) that the body breaks down into glucose. Your blood glucose level may be measured in mg/dL (milligrams per deciliter) or mmol/L (millimoles per liter).Your blood glucose may be checked with one or more of the following blood tests:  A fasting blood glucose (FBG) test. You will not be allowed to eat (you will fast) for at least 8 hours before a blood sample is taken. ? A normal range for FBG is 70-100 mg/dl (3.9-5.6 mmol/L).  An A1c (hemoglobin A1c) blood test. This test provides information about blood glucose control over the previous 2?33months.  An oral glucose tolerance test (OGTT). This test measures your blood glucose twice: ? After fasting. This is your baseline level. ? Two hours after you drink a beverage that contains glucose.  You may be diagnosed with prediabetes:  If your FBG is 100?125 mg/dL (5.6-6.9 mmol/L).  If your A1c level is 5.7?6.4%.  If your OGGT result is 140?199 mg/dL (7.8-11 mmol/L).  These blood tests may be repeated to confirm your diagnosis. What happens if blood glucose is too high? The pancreas produces a hormone (insulin) that helps move glucose from the bloodstream into cells. When cells in the body do not  respond properly to insulin that the body makes (insulin resistance), excess glucose builds up in the blood instead of going into cells. As a result, high blood glucose (hyperglycemia) can develop, which can cause many complications. This is a symptom of prediabetes. What can happen if blood glucose stays higher than normal for a long time? Having high blood glucose for a long time is dangerous. Too much glucose in your blood can damage your nerves and blood vessels. Long-term damage can lead to complications from diabetes, which may include:  Heart disease.  Stroke.  Blindness.  Kidney disease.  Depression.  Poor circulation in the feet and legs, which could lead to surgical removal (amputation) in severe cases.  How can prediabetes be prevented from turning into type 2 diabetes?  To help prevent type 2 diabetes, take the following actions:  Be physically active. ? Do moderate-intensity physical activity for at least 30 minutes on at least 5 days of the week, or as much as told by your health care provider. This could be brisk walking, biking, or water aerobics. ? Ask your health care provider what activities are safe for you. A mix of physical activities may be best, such as walking, swimming, cycling, and strength training.  Lose weight as told by your health care provider. ? Losing 5-7% of your body weight can reverse insulin resistance. ? Your health care provider can determine how much weight loss is best for you and can help you lose weight safely.  Follow a healthy meal plan. This includes eating lean proteins, complex carbohydrates, fresh fruits  and vegetables, low-fat dairy products, and healthy fats. ? Follow instructions from your health care provider about eating or drinking restrictions. ? Make an appointment to see a diet and nutrition specialist (registered dietitian) to help you create a healthy eating plan that is right for you.  Do not smoke or use any tobacco  products, such as cigarettes, chewing tobacco, and e-cigarettes. If you need help quitting, ask your health care provider.  Take over-the-counter and prescription medicines as told by your health care provider. You may be prescribed medicines that help lower the risk of type 2 diabetes.  This information is not intended to replace advice given to you by your health care provider. Make sure you discuss any questions you have with your health care provider. Document Released: 10/25/2015 Document Revised: 12/09/2015 Document Reviewed: 08/24/2015 Elsevier Interactive Patient Education  [employer].

## 2017-03-08 LAB — COMPLETE METABOLIC PANEL WITHOUT GFR
ALT: 18 U/L (ref 6–29)
AST: 27 U/L (ref 10–35)
Albumin: 3.7 g/dL (ref 3.6–5.1)
Alkaline Phosphatase: 79 U/L (ref 33–130)
BUN: 12 mg/dL (ref 7–25)
CO2: 24 mmol/L (ref 20–32)
Calcium: 9.2 mg/dL (ref 8.6–10.4)
Chloride: 104 mmol/L (ref 98–110)
Creat: 0.99 mg/dL (ref 0.50–1.05)
GFR, Est African American: 74 mL/min
GFR, Est Non African American: 64 mL/min
Glucose, Bld: 114 mg/dL — ABNORMAL HIGH (ref 65–99)
Potassium: 4.7 mmol/L (ref 3.5–5.3)
Sodium: 140 mmol/L (ref 135–146)
Total Bilirubin: 0.2 mg/dL (ref 0.2–1.2)
Total Protein: 7.3 g/dL (ref 6.1–8.1)

## 2017-03-08 LAB — LIPID PANEL
Cholesterol: 185 mg/dL
HDL: 43 mg/dL — ABNORMAL LOW
LDL Cholesterol: 117 mg/dL — ABNORMAL HIGH
Total CHOL/HDL Ratio: 4.3 ratio
Triglycerides: 127 mg/dL
VLDL: 25 mg/dL

## 2017-03-08 LAB — HEPATITIS C ANTIBODY: HCV Ab: NONREACTIVE

## 2017-03-08 LAB — MICROALBUMIN / CREATININE URINE RATIO
Creatinine, Urine: 159 mg/dL (ref 20–320)
Microalb Creat Ratio: 64 ug/mg{creat} — ABNORMAL HIGH
Microalb, Ur: 10.1 mg/dL

## 2017-04-09 ENCOUNTER — Ambulatory Visit: Payer: Self-pay | Admitting: Family Medicine

## 2017-04-16 MED FILL — ?CARVEDILOL 3.125 MG TABLET: 3.125 | 30 days supply | Qty: 60 | Fill #1

## 2017-04-16 MED FILL — ?METFORMIN HCL 500MG TABLET: 500 | 30 days supply | Qty: 30 | Fill #1

## 2017-05-18 MED FILL — ?METFORMIN HCL 500MG TABLET: 500 | 30 days supply | Qty: 30 | Fill #2

## 2017-05-18 MED FILL — ?CARVEDILOL 3.125 MG TABLET: 3.125 | 30 days supply | Qty: 60 | Fill #2

## 2017-06-12 MED FILL — ?CARVEDILOL 3.125 MG TABLET: 3.125 | 30 days supply | Qty: 60 | Fill #3

## 2017-06-12 MED FILL — ?METFORMIN HCL 500MG TABLET: 500 | 30 days supply | Qty: 30 | Fill #3

## 2017-06-27 ENCOUNTER — Ambulatory Visit (INDEPENDENT_AMBULATORY_CARE_PROVIDER_SITE_OTHER): Payer: Self-pay | Admitting: Family Medicine

## 2017-06-27 ENCOUNTER — Ambulatory Visit: Payer: Self-pay | Attending: Family Medicine

## 2017-06-27 ENCOUNTER — Encounter: Payer: Self-pay | Admitting: Family Medicine

## 2017-06-27 VITALS — BP 142/90 | HR 78 | Temp 99.0°F | Resp 18 | Ht 62.0 in | Wt 316.0 lb

## 2017-06-27 DIAGNOSIS — F329 Major depressive disorder, single episode, unspecified: Secondary | ICD-10-CM

## 2017-06-27 DIAGNOSIS — F32A Depression, unspecified: Secondary | ICD-10-CM

## 2017-06-27 DIAGNOSIS — I10 Essential (primary) hypertension: Secondary | ICD-10-CM

## 2017-06-27 DIAGNOSIS — R600 Localized edema: Secondary | ICD-10-CM

## 2017-06-27 DIAGNOSIS — R7303 Prediabetes: Secondary | ICD-10-CM

## 2017-06-27 LAB — POCT GLYCOSYLATED HEMOGLOBIN (HGB A1C): Hemoglobin A1C: 6.5

## 2017-06-27 MED ORDER — CARVEDILOL 6.25 MG PO TABS: 6.2500 mg | ORAL_TABLET | Freq: Two times a day (BID) | ORAL | 5 refills | Status: DC

## 2017-06-27 MED ORDER — SPIRONOLACTONE 25 MG PO TABS: 25.0000 mg | ORAL_TABLET | Freq: Every day | ORAL | 5 refills | Status: DC

## 2017-06-27 MED ORDER — BUPROPION HCL ER (SR) 150 MG PO TB12: ORAL_TABLET | ORAL | 5 refills | Status: DC

## 2017-06-27 MED FILL — ?CARVEDILOL 6.25 MG TABLET: 6.25 | 30 days supply | Qty: 60 | Fill #0

## 2017-06-27 MED FILL — ?SPIRONOLACTONE 25 MG TABLE: 25 | 30 days supply | Qty: 30 | Fill #0

## 2017-06-27 MED FILL — BUPROPION SR 150 MG TABLET: 150 | 30 days supply | Qty: 60 | Fill #0

## 2017-06-27 NOTE — Progress Notes (Signed)
Subjective:    Patient ID: Hannah Huerta, female    DOB: October 29, 1960, 56 y.o.   MRN: [id number]  HPI Hannah Huerta, a 56 year old female presents for a follow up of chronic conditions.  She has a history of CHF, CAD, hypertension, and morbid obesity.  She does not follow and exercise routine and is not adherent to a lowfat, low sodium diet. She endorses bilateral lower extremity edema. She denies chest pain, dyspnea, near-syncope, palpitations and tachypnea.  Cardiovascular risk factors: obesity (BMI >= 30 kg/m2), sedentary lifestyle and smoking/ tobacco exposure.   Patient is complaining of depression. She recently lost her husband to a massive stroke. She states that coping has been difficult. Symptoms include anhedonia, depressed mood, and feelings of losing control. She has not been treated for depression in the past . She denies suicidal or homicidal intent.  Social History   Socioeconomic History  . Marital status: Married    Spouse name: Not on file  . Number of children: 3  . Years of education: Not on file  . Highest education level: Not on file  Social Needs  . Financial resource strain: Not on file  . Food insecurity - worry: Not on file  . Food insecurity - inability: Not on file  . Transportation needs - medical: Not on file  . Transportation needs - non-medical: Not on file  Occupational History  . Occupation: Works in the Estate manager/land agent: [employer]  . Smoking status: Never Smoker  . Smokeless tobacco: Never Used  Substance and Sexual Activity  . Alcohol use: No  . Drug use: No  . Sexual activity: Yes    Birth control/protection: Surgical  Other Topics Concern  . Not on file  Social History Narrative   Lives at home with husband and two of her sons.      Immunization History  Administered Date(s) Administered  . Influenza,inj,Quad PF,6+ Mos 04/28/2014, 07/25/2016  . Pneumococcal Polysaccharide-23 04/28/2014, 03/01/2017  . Tdap 09/15/2012  The  10-year ASCVD risk score Mikey Bussing DC Brooke Bonito., et al., 2013) is: 5.4%   Values used to calculate the score:     Age: 89 years     Sex: Female     Is Non-Hispanic African American: Yes     Diabetic: No     Tobacco smoker: No     Systolic Blood Pressure: 277 mmHg     Is BP treated: No     HDL Cholesterol: 43 mg/dL     Total Cholesterol: 185 mg/dL Depression screen [facility] 2/9 06/27/2017 06/27/2017 03/07/2017 03/07/2017 05/13/2014  Decreased Interest 1 0 2 0 0  Down, Depressed, Hopeless 2 1 1 1  0  PHQ - 2 Score 3 1 3 1  0  Altered sleeping 1 - 1 - -  Tired, decreased energy 1 - 1 - -  Change in appetite - - 1 - -  Feeling bad or failure about yourself  0 - 1 - -  Trouble concentrating 0 - 1 - -  Moving slowly or fidgety/restless 0 - 0 - -  Suicidal thoughts 0 - 0 - -  PHQ-9 Score 5 - 8 - -  Difficult doing work/chores - - Somewhat difficult - -    Review of Systems  Constitutional: Positive for fatigue and unexpected weight change (Weight gain). Negative for fever.  Eyes: Negative.   Respiratory: Positive for shortness of breath.   Cardiovascular: Positive for leg swelling. Negative for chest pain and  palpitations.  Gastrointestinal: Negative.   Endocrine: Negative for polydipsia, polyphagia and polyuria.  Genitourinary: Negative.   Musculoskeletal: Negative.   Skin: Negative.   Neurological: Negative.   Hematological: Negative.   Psychiatric/Behavioral: Negative.        Objective:   Physical Exam  Constitutional: She is oriented to person, place, and time.  HENT:  Head: Normocephalic and atraumatic.  Right Ear: External ear normal.  Left Ear: External ear normal.  Nose: Nose normal.  Mouth/Throat: Oropharynx is clear and moist.  Eyes: Pupils are equal, round, and reactive to light.  Neck: Normal range of motion.  Cardiovascular: Normal rate, regular rhythm, normal heart sounds and intact distal pulses.  Pulses:      Carotid pulses are 2+ on the right side, and 2+ on the left  side.      Radial pulses are 2+ on the right side, and 2+ on the left side.       Femoral pulses are 2+ on the right side, and 2+ on the left side.      Popliteal pulses are 2+ on the right side, and 2+ on the left side.       Dorsalis pedis pulses are 2+ on the right side, and 2+ on the left side.       Posterior tibial pulses are 2+ on the right side, and 2+ on the left side.  2+ lower extremity edema  Pulmonary/Chest: Effort normal and breath sounds normal.  Abdominal:  Increased abdominal obesity  Musculoskeletal: Normal range of motion.  Neurological: She is alert and oriented to person, place, and time.      BP (!) 142/90   Pulse 78   Temp 99 F (37.2 C) (Oral)   Resp 18   Ht 5\' 2"  (1.575 m)   Wt (!) 316 lb (143.3 kg)   LMP 09/15/2013   SpO2 97%   BMI 57.80 kg/m  Assessment & Plan:  Essential hypertension - spironolactone (ALDACTONE) 25 MG tablet; Take 1 tablet (25 mg total) by mouth daily.  Dispense: 30 tablet; Refill: 5 - carvedilol (COREG) 6.25 MG tablet; Take 1 tablet (6.25 mg total) by mouth 2 (two) times daily with a meal.  Dispense: 60 tablet; Refill: 5 - CMP and Liver  Prediabetes Hemoglobin a1C has increased 6.5. Will continue metformin 500 mg with breakfast.  - HgB A1c  Depression, unspecified depression type Will start a trial of Wellbutrin. Wellbutrin SR (bupropion) is prescribed as 150 mg BID. Start for the first few days at one pill daily in the morning; if no side effects, take the second pill around 2PM to avoid insomnia. Call if any persisting side effects. - buPROPion (WELLBUTRIN SR) 150 MG 12 hr tablet; Take 150 mg for 3 days, if well tolerated, increase to 150 mg every 12 hours  Dispense: 60 tablet; Refill: 5  Bilateral lower extremity edema - Brain natriuretic peptide   RTC: Will follow up by phone with any abnormal laboratory results. 1 week for BP check and 1 month for hypertension   Donia Pounds  MSN, FNP-C Patient [place] [address] [place], [place] [address] [phone]

## 2017-06-27 NOTE — Patient Instructions (Addendum)
Your A1C goal is less than 7. Your fasting blood sugar  Upon awakening goal is between 110-140.  Your LDL  (bad cholesterol goal is less than 100 Blood pressure goal is <140/90.  Recommend a lowfat, low carbohydrate diet divided over 5-6 small meals, increase water intake to 6-8 glasses, and 150 minutes per week of cardiovascular exercise.   Take your medications as prescribed Make sure that you are familiar with each one of your medications and what they are used to treat.  If you are unsure of medications, please bring to follow up Will send referral for eye exam  Please keep your scheduled follow up appointment.   Wellbutrin SR (bupropion) is prescribed as 150 mg BID. Start for the first few days at one pill daily in the morning; if no side effects, take the second pill around 2PM to avoid insomnia. Call if any persisting side effects.

## 2017-06-28 LAB — CMP AND LIVER
ALT: 9 IU/L (ref 0–32)
AST: 20 IU/L (ref 0–40)
Albumin: 4 g/dL (ref 3.5–5.5)
Alkaline Phosphatase: 102 IU/L (ref 39–117)
BUN: 11 mg/dL (ref 6–24)
Bilirubin Total: 0.2 mg/dL (ref 0.0–1.2)
Bilirubin, Direct: 0.08 mg/dL (ref 0.00–0.40)
CO2: 30 mmol/L — ABNORMAL HIGH (ref 20–29)
Calcium: 9.5 mg/dL (ref 8.7–10.2)
Chloride: 102 mmol/L (ref 96–106)
Creatinine, Ser: 0.85 mg/dL (ref 0.57–1.00)
GFR calc Af Amer: 89 mL/min/{1.73_m2} (ref >59)
GFR calc non Af Amer: 77 mL/min/{1.73_m2} (ref >59)
Glucose: 96 mg/dL (ref 65–99)
Potassium: 4.1 mmol/L (ref 3.5–5.2)
Sodium: 143 mmol/L (ref 134–144)
Total Protein: 7.5 g/dL (ref 6.0–8.5)

## 2017-06-28 LAB — POCT URINALYSIS DIP (DEVICE)
Bilirubin Urine: NEGATIVE
Glucose, UA: NEGATIVE mg/dL
Hgb urine dipstick: NEGATIVE
Ketones, ur: NEGATIVE mg/dL
Leukocytes, UA: NEGATIVE
Nitrite: NEGATIVE
Protein, ur: 30 mg/dL — AB
Specific Gravity, Urine: 1.015 (ref 1.005–1.030)
Urobilinogen, UA: 0.2 mg/dL (ref 0.0–1.0)
pH: 6 (ref 5.0–8.0)

## 2017-06-28 LAB — BRAIN NATRIURETIC PEPTIDE: BNP: 27.9 pg/mL (ref 0.0–100.0)

## 2017-07-04 ENCOUNTER — Ambulatory Visit: Payer: Self-pay

## 2017-07-04 VITALS — BP 136/82

## 2017-07-04 DIAGNOSIS — I10 Essential (primary) hypertension: Secondary | ICD-10-CM

## 2017-07-11 MED FILL — ?METFORMIN HCL 500MG TABLET: 500 | 30 days supply | Qty: 30 | Fill #4

## 2017-07-25 MED FILL — SPIRONOLACTONE 25 MG TABS: 25 | 30 days supply | Qty: 30 | Fill #1

## 2017-07-30 ENCOUNTER — Ambulatory Visit: Payer: Self-pay | Admitting: Family Medicine

## 2017-08-01 ENCOUNTER — Encounter: Payer: Self-pay | Admitting: Family Medicine

## 2017-08-01 ENCOUNTER — Ambulatory Visit (INDEPENDENT_AMBULATORY_CARE_PROVIDER_SITE_OTHER): Payer: Self-pay | Admitting: Family Medicine

## 2017-08-01 VITALS — BP 124/78 | HR 80 | Temp 98.6°F | Resp 16 | Ht 62.0 in | Wt 307.0 lb

## 2017-08-01 DIAGNOSIS — I10 Essential (primary) hypertension: Secondary | ICD-10-CM

## 2017-08-03 NOTE — Patient Instructions (Signed)
DASH Eating Plan DASH stands for "Dietary Approaches to Stop Hypertension." The DASH eating plan is a healthy eating plan that has been shown to reduce high blood pressure (hypertension). It may also reduce your risk for type 2 diabetes, heart disease, and stroke. The DASH eating plan may also help with weight loss. What are tips for following this plan? General guidelines  Avoid eating more than 2,300 mg (milligrams) of salt (sodium) a day. If you have hypertension, you may need to reduce your sodium intake to 1,500 mg a day.  Limit alcohol intake to no more than 1 drink a day for nonpregnant women and 2 drinks a day for men. One drink equals 12 oz of beer, 5 oz of wine, or 1 oz of hard liquor.  Work with your health care provider to maintain a healthy body weight or to lose weight. Ask what an ideal weight is for you.  Get at least 30 minutes of exercise that causes your heart to beat faster (aerobic exercise) most days of the week. Activities may include walking, swimming, or biking.  Work with your health care provider or diet and nutrition specialist (dietitian) to adjust your eating plan to your individual calorie needs. Reading food labels  Check food labels for the amount of sodium per serving. Choose foods with less than 5 percent of the Daily Value of sodium. Generally, foods with less than 300 mg of sodium per serving fit into this eating plan.  To find whole grains, look for the word "whole" as the first word in the ingredient list. Shopping  Buy products labeled as "low-sodium" or "no salt added."  Buy fresh foods. Avoid canned foods and premade or frozen meals. Cooking  Avoid adding salt when cooking. Use salt-free seasonings or herbs instead of table salt or sea salt. Check with your health care provider or pharmacist before using salt substitutes.  Do not fry foods. Cook foods using healthy methods such as baking, boiling, grilling, and broiling instead.  Cook with  heart-healthy oils, such as olive, canola, soybean, or sunflower oil. Meal planning   Eat a balanced diet that includes: ? 5 or more servings of fruits and vegetables each day. At each meal, try to fill half of your plate with fruits and vegetables. ? Up to 6-8 servings of whole grains each day. ? Less than 6 oz of lean meat, poultry, or fish each day. A 3-oz serving of meat is about the same size as a deck of cards. One egg equals 1 oz. ? 2 servings of low-fat dairy each day. ? A serving of nuts, seeds, or beans 5 times each week. ? Heart-healthy fats. Healthy fats called Omega-3 fatty acids are found in foods such as flaxseeds and coldwater fish, like sardines, salmon, and mackerel.  Limit how much you eat of the following: ? Canned or prepackaged foods. ? Food that is high in trans fat, such as fried foods. ? Food that is high in saturated fat, such as fatty meat. ? Sweets, desserts, sugary drinks, and other foods with added sugar. ? Full-fat dairy products.  Do not salt foods before eating.  Try to eat at least 2 vegetarian meals each week.  Eat more home-cooked food and less restaurant, buffet, and fast food.  When eating at a restaurant, ask that your food be prepared with less salt or no salt, if possible. What foods are recommended? The items listed may not be a complete list. Talk with your dietitian about what   dietary choices are best for you. Grains Whole-grain or whole-wheat bread. Whole-grain or whole-wheat pasta. Brown rice. Oatmeal. Quinoa. Bulgur. Whole-grain and low-sodium cereals. Pita bread. Low-fat, low-sodium crackers. Whole-wheat flour tortillas. Vegetables Fresh or frozen vegetables (raw, steamed, roasted, or grilled). Low-sodium or reduced-sodium tomato and vegetable juice. Low-sodium or reduced-sodium tomato sauce and tomato paste. Low-sodium or reduced-sodium canned vegetables. Fruits All fresh, dried, or frozen fruit. Canned fruit in natural juice (without  added sugar). Meat and other protein foods Skinless chicken or turkey. Ground chicken or turkey. Pork with fat trimmed off. Fish and seafood. Egg whites. Dried beans, peas, or lentils. Unsalted nuts, nut butters, and seeds. Unsalted canned beans. Lean cuts of beef with fat trimmed off. Low-sodium, lean deli meat. Dairy Low-fat (1%) or fat-free (skim) milk. Fat-free, low-fat, or reduced-fat cheeses. Nonfat, low-sodium ricotta or cottage cheese. Low-fat or nonfat yogurt. Low-fat, low-sodium cheese. Fats and oils Soft margarine without trans fats. Vegetable oil. Low-fat, reduced-fat, or light mayonnaise and salad dressings (reduced-sodium). Canola, safflower, olive, soybean, and sunflower oils. Avocado. Seasoning and other foods Herbs. Spices. Seasoning mixes without salt. Unsalted popcorn and pretzels. Fat-free sweets. What foods are not recommended? The items listed may not be a complete list. Talk with your dietitian about what dietary choices are best for you. Grains Baked goods made with fat, such as croissants, muffins, or some breads. Dry pasta or rice meal packs. Vegetables Creamed or fried vegetables. Vegetables in a cheese sauce. Regular canned vegetables (not low-sodium or reduced-sodium). Regular canned tomato sauce and paste (not low-sodium or reduced-sodium). Regular tomato and vegetable juice (not low-sodium or reduced-sodium). Pickles. Olives. Fruits Canned fruit in a light or heavy syrup. Fried fruit. Fruit in cream or butter sauce. Meat and other protein foods Fatty cuts of meat. Ribs. Fried meat. Bacon. Sausage. Bologna and other processed lunch meats. Salami. Fatback. Hotdogs. Bratwurst. Salted nuts and seeds. Canned beans with added salt. Canned or smoked fish. Whole eggs or egg yolks. Chicken or turkey with skin. Dairy Whole or 2% milk, cream, and half-and-half. Whole or full-fat cream cheese. Whole-fat or sweetened yogurt. Full-fat cheese. Nondairy creamers. Whipped toppings.  Processed cheese and cheese spreads. Fats and oils Butter. Stick margarine. Lard. Shortening. Ghee. Bacon fat. Tropical oils, such as coconut, palm kernel, or palm oil. Seasoning and other foods Salted popcorn and pretzels. Onion salt, garlic salt, seasoned salt, table salt, and sea salt. Worcestershire sauce. Tartar sauce. Barbecue sauce. Teriyaki sauce. Soy sauce, including reduced-sodium. Steak sauce. Canned and packaged gravies. Fish sauce. Oyster sauce. Cocktail sauce. Horseradish that you find on the shelf. Ketchup. Mustard. Meat flavorings and tenderizers. Bouillon cubes. Hot sauce and Tabasco sauce. Premade or packaged marinades. Premade or packaged taco seasonings. Relishes. Regular salad dressings. Where to find more information:  National Heart, Lung, and Blood Institute: www.nhlbi.nih.gov  American Heart Association: www.heart.org Summary  The DASH eating plan is a healthy eating plan that has been shown to reduce high blood pressure (hypertension). It may also reduce your risk for type 2 diabetes, heart disease, and stroke.  With the DASH eating plan, you should limit salt (sodium) intake to 2,300 mg a day. If you have hypertension, you may need to reduce your sodium intake to 1,500 mg a day.  When on the DASH eating plan, aim to eat more fresh fruits and vegetables, whole grains, lean proteins, low-fat dairy, and heart-healthy fats.  Work with your health care provider or diet and nutrition specialist (dietitian) to adjust your eating plan to your individual   calorie needs. This information is not intended to replace advice given to you by your health care provider. Make sure you discuss any questions you have with your health care provider. Document Released: 06/22/2011 Document Revised: 06/26/2016 Document Reviewed: 06/26/2016 Elsevier Interactive Patient Education  2018 [employer] Inc.  

## 2017-08-03 NOTE — Progress Notes (Signed)
Subjective:    Hannah Huerta, a 57 year old patient presents for a follow up of hypertension. Patient says that she is taking anti-hypertensive medication consistently. Patient does not exercise routinely and does not follow a low sodium diet. She has not been checking blood pressures consistently at home.   . Patient denies: dyspnea, fatigue, orthopnea, syncope and tachypnea. Cardiovascular risk factors: obesity (BMI >= 30 kg/m2) and sedentary lifestyle.  Past Medical History:  Diagnosis Date  . Abnormal Pap smear of cervix    pt couldnt state what type of abnormality  . Anemia   . CAD in native artery 06/05/2015  . Chest pain    a. 02/2012 abnl myoview - inferoapical reverisbility concerning for ischemia, EF 59%;   02/2012 nonobstructive cath  . CHF (congestive heart failure) ([place])   . Chronic migraine   . GERD (gastroesophageal reflux disease)   . HLD (hyperlipidemia)   . Hypertension   . Hypokalemia 06/05/2015  . Metabolic syndrome 76/28/3151  . Morbid obesity with BMI of 50.0-59.9, adult ([place])   . Prediabetes   . S/P cardiac catheterization, non obstructive disease 06/04/15 06/05/2015  . Sickle cell trait ([place])   . Syncope    Social History   Socioeconomic History  . Marital status: Married    Spouse name: Not on file  . Number of children: 3  . Years of education: Not on file  . Highest education level: Not on file  Social Needs  . Financial resource strain: Not on file  . Food insecurity - worry: Not on file  . Food insecurity - inability: Not on file  . Transportation needs - medical: Not on file  . Transportation needs - non-medical: Not on file  Occupational History  . Occupation: Works in the Estate manager/land agent: [employer] Use  . Smoking status: Never Smoker  . Smokeless tobacco: Never Used  Substance and Sexual Activity  . Alcohol use: No  . Drug use: No  . Sexual activity: Yes    Birth control/protection: Surgical  Other Topics Concern  . Not on file   Social History Narrative   Lives at home with husband and two of her sons.    Review of Systems  Constitutional: Negative.   HENT: Negative.   Eyes: Negative.   Respiratory: Negative.   Cardiovascular: Negative.  Negative for chest pain, palpitations and orthopnea.  Gastrointestinal: Negative.   Genitourinary: Negative.   Musculoskeletal: Negative.   Skin: Negative.   Neurological: Negative.   Psychiatric/Behavioral: Negative.     Objective:  BP 124/78 (BP Location: Right Arm, Patient Position: Sitting, Cuff Size: Large) Comment: manually  Pulse 80   Temp 98.6 F (37 C) (Oral)   Resp 16   Ht 5\' 2"  (1.575 m)   Wt (!) 307 lb (139.3 kg)   LMP 09/15/2013   SpO2 98%   BMI 56.15 kg/m   General Appearance:    Alert, cooperative, no distress, appears stated age  Head:    Normocephalic, without obvious abnormality, atraumatic  Eyes:    PERRL, conjunctiva/corneas clear, EOM's intact, fundi    benign, both eyes  Ears:    Normal TM's and external ear canals, both ears  Nose:   Nares normal, septum midline, mucosa normal, no drainage    or sinus tenderness  Throat:   Lips, mucosa, and tongue normal; teeth and gums normal  Neck:   Supple, symmetrical, trachea midline, no adenopathy;    thyroid:  no enlargement/tenderness/nodules; no carotid  bruit or JVD  Back:     Symmetric, no curvature, ROM normal, no CVA tenderness  Lungs:     Clear to auscultation bilaterally, respirations unlabored  Chest Wall:    No tenderness or deformity   Heart:    Regular rate and rhythm, S1 and S2 normal, no murmur, rub   or gallop  Abdomen:     Soft, non-tender, bowel sounds active all four quadrants,    no masses, no organomegaly  Extremities:   Extremities normal, atraumatic, no cyanosis or edema  Pulses:   2+ and symmetric all extremities  Skin:   Skin color, texture, turgor normal, no rashes or lesions  Lymph nodes:   Cervical, supraclavicular, and axillary nodes normal  Neurologic:   CNII-XII  intact, normal strength, sensation and reflexes    throughout    Assessment:  BP 124/78 (BP Location: Right Arm, Patient Position: Sitting, Cuff Size: Large) Comment: manually  Pulse 80   Temp 98.6 F (37 C) (Oral)   Resp 16   Ht 5\' 2"  (1.575 m)   Wt (!) 307 lb (139.3 kg)   LMP 09/15/2013   SpO2 98%   BMI 56.15 kg/m  Plan:   Essential hypertension Blood pressure is at goal on current medication regiment.  Reviewed urinalysis, no proteinuria present We have discussed target BP range and blood pressure goal. I have advised patient to check BP regularly and to call us back or report to clinic if the numbers are consistently higher than 140/90. We discussed the importance of compliance with medical therapy and DASH diet recommended, consequences of uncontrolled hypertension discussed.  - continue current BP medications Severe obesity (BMI >= 40) (HCC) Recommend a lowfat, low carbohydrate diet divided over 5-6 small meals, increase water intake to 6-8 glasses, and 150 minutes per week of cardiovascular exercise.    RTC: 6 months for hypertension   Donia Pounds  MSN, FNP-C [facility] [address] [place], [place] [address] [phone]

## 2017-08-14 MED FILL — ?METFORMIN HCL 500MG TABLET: 500 | 30 days supply | Qty: 30 | Fill #5

## 2017-08-14 MED FILL — ?CARVEDILOL 6.25 MG TABLET: 6.25 | 30 days supply | Qty: 60 | Fill #1

## 2017-08-22 ENCOUNTER — Encounter (HOSPITAL_COMMUNITY): Payer: Self-pay | Admitting: Emergency Medicine

## 2017-08-22 ENCOUNTER — Emergency Department (HOSPITAL_COMMUNITY): Payer: Self-pay

## 2017-08-22 ENCOUNTER — Emergency Department (HOSPITAL_COMMUNITY)
Admission: EM | Admit: 2017-08-22 | Discharge: 2017-08-22 | Discharge status: Against Medical Advice | Payer: Self-pay | Attending: Emergency Medicine | Admitting: Emergency Medicine

## 2017-08-22 DIAGNOSIS — Z5321 Procedure and treatment not carried out due to patient leaving prior to being seen by health care provider: Secondary | ICD-10-CM | POA: Insufficient documentation

## 2017-08-22 LAB — BASIC METABOLIC PANEL
Anion gap: 8 (ref 5–15)
BUN: 15 mg/dL (ref 6–20)
CO2: 27 mmol/L (ref 22–32)
Calcium: 9 mg/dL (ref 8.9–10.3)
Chloride: 103 mmol/L (ref 101–111)
Creatinine, Ser: 0.88 mg/dL (ref 0.44–1.00)
GFR calc Af Amer: >60 mL/min (ref >60)
GFR calc non Af Amer: >60 mL/min (ref >60)
Glucose, Bld: 120 mg/dL — ABNORMAL HIGH (ref 65–99)
Potassium: 3.8 mmol/L (ref 3.5–5.1)
Sodium: 138 mmol/L (ref 135–145)

## 2017-08-22 LAB — CBC
HCT: 35.3 % — ABNORMAL LOW (ref 36.0–46.0)
Hemoglobin: 11.7 g/dL — ABNORMAL LOW (ref 12.0–15.0)
MCH: 24.9 pg — ABNORMAL LOW (ref 26.0–34.0)
MCHC: 33.1 g/dL (ref 30.0–36.0)
MCV: 75.3 fL — ABNORMAL LOW (ref 78.0–100.0)
Platelets: 275 10*3/uL (ref 150–400)
RBC: 4.69 MIL/uL (ref 3.87–5.11)
RDW: 15.2 % (ref 11.5–15.5)
WBC: 5.3 10*3/uL (ref 4.0–10.5)

## 2017-08-22 LAB — I-STAT TROPONIN, ED: Troponin i, poc: 0 ng/mL (ref 0.00–0.08)

## 2017-08-22 LAB — I-STAT BETA HCG BLOOD, ED (MC, WL, AP ONLY): I-stat hCG, quantitative: <5 m[IU]/mL (ref <5)

## 2017-08-22 NOTE — ED Triage Notes (Signed)
Patient here with complaints of intermittent chest pain since last week radiating around to lower back. SOB, nausea. Reports hx of same. Denies swelling, reports being on diuretic.

## 2017-09-06 ENCOUNTER — Telehealth: Payer: Self-pay

## 2017-09-06 DIAGNOSIS — R7303 Prediabetes: Secondary | ICD-10-CM

## 2017-09-06 MED ORDER — METFORMIN HCL 500 MG PO TABS: 500.0000 mg | ORAL_TABLET | Freq: Every day | ORAL | 5 refills | Status: DC

## 2017-09-06 NOTE — Telephone Encounter (Signed)
Refill for metformin sent into pharmacy. Thanks!

## 2017-09-14 MED FILL — ?CARVEDILOL 6.25 MG TABLET: 6.25 | 30 days supply | Qty: 60 | Fill #2

## 2017-09-14 MED FILL — ?METFORMIN HCL 500MG TABLET: 500 | 30 days supply | Qty: 30 | Fill #0

## 2017-10-09 MED FILL — ?CARVEDILOL 6.25 MG TABLET: 6.25 | 30 days supply | Qty: 60 | Fill #3

## 2017-10-09 MED FILL — ?METFORMIN HCL 500MG TABLET: 500 | 30 days supply | Qty: 30 | Fill #1

## 2017-10-18 MED FILL — ?SPIRONOLACTONE 25 MG TABLE: 25 | 30 days supply | Qty: 30 | Fill #2

## 2017-11-12 MED FILL — ?CARVEDILOL 6.25 MG TABLET: 6.25 | 30 days supply | Qty: 60 | Fill #4

## 2017-11-12 MED FILL — ?METFORMIN HCL 500MG TABLET: 500 | 30 days supply | Qty: 30 | Fill #2

## 2017-11-12 MED FILL — ?SPIRONOLACTONE 25 MG TABLE: 25 | 30 days supply | Qty: 30 | Fill #3

## 2017-12-03 ENCOUNTER — Other Ambulatory Visit: Payer: Self-pay

## 2017-12-03 DIAGNOSIS — E7849 Other hyperlipidemia: Secondary | ICD-10-CM

## 2017-12-03 MED ORDER — ATORVASTATIN CALCIUM 40 MG PO TABS: 40.0000 mg | ORAL_TABLET | Freq: Every day | ORAL | 3 refills | Status: DC

## 2017-12-03 MED FILL — ?ATORVASTATIN 40MG TABLET: 40 | 30 days supply | Qty: 30 | Fill #0

## 2017-12-12 MED FILL — ?METFORMIN HCL 500MG TABLET: 500 | 30 days supply | Qty: 30 | Fill #3

## 2017-12-12 MED FILL — ?CARVEDILOL 6.25 MG TABLET: 6.25 | 30 days supply | Qty: 60 | Fill #5

## 2017-12-12 MED FILL — ?SPIRONOLACTONE 25 MG TABLE: 25 | 30 days supply | Qty: 30 | Fill #4

## 2017-12-31 MED FILL — ?ATORVASTATIN 40MG TABLET: 40 | 30 days supply | Qty: 30 | Fill #1

## 2018-01-07 MED FILL — ?METFORMIN HCL 500MG TABLET: 500 | 30 days supply | Qty: 30 | Fill #4

## 2018-01-07 MED FILL — ?SPIRONOLACTONE 25 MG TABLE: 25 | 30 days supply | Qty: 30 | Fill #5

## 2018-01-14 ENCOUNTER — Telehealth: Payer: Self-pay

## 2018-01-14 DIAGNOSIS — I10 Essential (primary) hypertension: Secondary | ICD-10-CM

## 2018-01-14 MED ORDER — SPIRONOLACTONE 25 MG PO TABS: 25.0000 mg | ORAL_TABLET | Freq: Every day | ORAL | 5 refills | Status: DC

## 2018-01-14 MED ORDER — CARVEDILOL 6.25 MG PO TABS: 6.2500 mg | ORAL_TABLET | Freq: Two times a day (BID) | ORAL | 5 refills | Status: DC

## 2018-01-14 NOTE — Telephone Encounter (Signed)
Refills sent into pharmacy. Thanks!  

## 2018-01-15 ENCOUNTER — Other Ambulatory Visit: Payer: Self-pay | Admitting: Family Medicine

## 2018-01-15 DIAGNOSIS — I10 Essential (primary) hypertension: Secondary | ICD-10-CM

## 2018-01-15 MED FILL — ?CARVEDILOL 6.25 MG TABLET: 6.25 | 30 days supply | Qty: 60 | Fill #0

## 2018-01-23 ENCOUNTER — Ambulatory Visit: Payer: Self-pay | Attending: Anesthesiology

## 2018-01-28 MED FILL — ?METFORMIN HCL 500MG TABS: 500 | 30 days supply | Qty: 30 | Fill #5

## 2018-01-28 MED FILL — ?ATORVASTATIN 40MG TABLET: 40 | 30 days supply | Qty: 30 | Fill #2

## 2018-01-30 ENCOUNTER — Encounter: Payer: Self-pay | Admitting: Family Medicine

## 2018-01-30 ENCOUNTER — Ambulatory Visit (INDEPENDENT_AMBULATORY_CARE_PROVIDER_SITE_OTHER): Payer: Self-pay | Admitting: Family Medicine

## 2018-01-30 VITALS — BP 162/90 | HR 73 | Temp 99.2°F | Resp 16 | Ht 62.0 in | Wt 294.0 lb

## 2018-01-30 DIAGNOSIS — E7849 Other hyperlipidemia: Secondary | ICD-10-CM

## 2018-01-30 DIAGNOSIS — I10 Essential (primary) hypertension: Secondary | ICD-10-CM

## 2018-01-30 DIAGNOSIS — I5022 Chronic systolic (congestive) heart failure: Secondary | ICD-10-CM

## 2018-01-30 DIAGNOSIS — J301 Allergic rhinitis due to pollen: Secondary | ICD-10-CM

## 2018-01-30 DIAGNOSIS — G5600 Carpal tunnel syndrome, unspecified upper limb: Secondary | ICD-10-CM

## 2018-01-30 DIAGNOSIS — G43709 Chronic migraine without aura, not intractable, without status migrainosus: Secondary | ICD-10-CM

## 2018-01-30 DIAGNOSIS — IMO0002 Reserved for concepts with insufficient information to code with codable children: Secondary | ICD-10-CM

## 2018-01-30 DIAGNOSIS — R7303 Prediabetes: Secondary | ICD-10-CM

## 2018-01-30 DIAGNOSIS — I509 Heart failure, unspecified: Secondary | ICD-10-CM | POA: Insufficient documentation

## 2018-01-30 LAB — POCT URINALYSIS DIPSTICK
Bilirubin, UA: NEGATIVE
Glucose, UA: NEGATIVE
Ketones, UA: NEGATIVE
Leukocytes, UA: NEGATIVE
Nitrite, UA: NEGATIVE
Protein, UA: POSITIVE — AB
Spec Grav, UA: 1.015 (ref 1.010–1.025)
Urobilinogen, UA: 0.2 E.U./dL
pH, UA: 5.5 (ref 5.0–8.0)

## 2018-01-30 LAB — POCT GLYCOSYLATED HEMOGLOBIN (HGB A1C): Hemoglobin A1C: 6 % — AB (ref 4.0–5.6)

## 2018-01-30 MED ORDER — SPIRONOLACTONE 25 MG PO TABS: 25.0000 mg | ORAL_TABLET | Freq: Every day | ORAL | 5 refills | Status: DC

## 2018-01-30 MED ORDER — METFORMIN HCL 500 MG PO TABS: 500.0000 mg | ORAL_TABLET | Freq: Every day | ORAL | 5 refills | Status: DC

## 2018-01-30 MED ORDER — ATORVASTATIN CALCIUM 40 MG PO TABS: 40.0000 mg | ORAL_TABLET | Freq: Every day | ORAL | 3 refills | Status: DC

## 2018-01-30 MED ORDER — CARVEDILOL 6.25 MG PO TABS: ORAL_TABLET | ORAL | 5 refills | Status: DC

## 2018-01-30 MED ORDER — FLUTICASONE PROPIONATE 50 MCG/ACT NA SUSP: 2.0000 | Freq: Every day | NASAL | 6 refills | Status: DC

## 2018-01-30 MED ORDER — AMLODIPINE BESYLATE 5 MG PO TABS: 5.0000 mg | ORAL_TABLET | Freq: Every day | ORAL | 3 refills | Status: DC

## 2018-01-30 MED FILL — ?AMLODIPINE BESYLATE 5 MG T: 5 MG | 30 days supply | Qty: 30 | Fill #0

## 2018-01-30 MED FILL — FLUTICASONE PROP 50 MCG SPR: 50 | 30 days supply | Qty: 16 | Fill #0

## 2018-01-30 MED FILL — SPIRONOLACTONE 25 MG TABLET: 25 | 30 days supply | Qty: 30 | Fill #0

## 2018-01-30 NOTE — Progress Notes (Signed)
Subjective:    Patient here for follow-up of elevated blood pressure.  She is not exercising and is adherent to a low-salt diet.  Blood pressure is not well controlled at home. Cardiac symptoms: dyspnea and lower extremity edema. Patient denies: chest pain and palpitations. Cardiovascular risk factors: dyslipidemia, hypertension, obesity (BMI >= 30 kg/m2) and sedentary lifestyle. Use of agents associated with hypertension: none. History of target organ damage: heart failure.  The following portions of the patient's history were reviewed and updated as appropriate: allergies, current medications, past family history, past medical history, past social history, past surgical history and problem list.  Review of Systems Pertinent items noted in HPI and remainder of comprehensive ROS otherwise negative.     Objective:    BP (!) 162/90 (BP Location: Right Arm, Patient Position: Sitting, Cuff Size: Large) Comment: manually  Pulse 73   Temp 99.2 F (37.3 C) (Oral)   Resp 16   Ht 5\' 2"  (1.575 m)   Wt 294 lb (133.4 kg)   LMP 09/15/2013   SpO2 99%   BMI 53.77 kg/m   General Appearance:    Alert, cooperative, no distress, appears stated age  Head:    Normocephalic, without obvious abnormality, atraumatic  Eyes:    PERRL, conjunctiva/corneas clear, EOM's intact, fundi    benign, both eyes  Ears:    Normal TM's and external ear canals, both ears  Nose:   Nares with edema and paleness, septum midline, mucosa normal, no drainage    or sinus tenderness  Throat:   Lips, mucosa, and tongue normal; poor dentition  Neck:   Supple, symmetrical, trachea midline, no adenopathy;    thyroid:  no enlargement/tenderness/nodules; no carotid   bruit or JVD  Back:     Symmetric, no curvature, ROM normal, no CVA tenderness  Lungs:     Clear to auscultation bilaterally, respirations unlabored  Chest Wall:    No tenderness or deformity   Heart:    Regular rate and rhythm, S1 and S2 normal, no murmur, rub   or  gallop     Abdomen:     Soft, non-tender, bowel sounds active all four quadrants,    no masses, no organomegaly        Extremities:   Extremities normal, atraumatic, no cyanosis or + non-pitting edema  Pulses:   2+ and symmetric all extremities  Skin:   Skin color, texture, turgor normal, no rashes or lesions  Lymph nodes:   Posterior cervical lymphnode on the right side. Non tender.   Neurologic:   CNII-XII intact, normal strength, sensation and reflexes    throughout      Assessment:    Hypertension, not well controlled . Evidence of target organ damage: past medical hx of aki.    Plan:  1. Essential hypertension  - Urinalysis Dipstick - carvedilol (COREG) 6.25 MG tablet; TAKE 1 TABLET BY MOUTH 2 TIMES DAILY WITH A MEAL.  Dispense: 60 tablet; Refill: 5 - spironolactone (ALDACTONE) 25 MG tablet; Take 1 tablet (25 mg total) by mouth daily.  Dispense: 30 tablet; Refill: 5 - CBC with Differential; Future - Comprehensive metabolic panel; Future - TSH; Future - Lipid Panel; Future  2. Prediabetes - HgB A1c - metFORMIN (GLUCOPHAGE) 500 MG tablet; Take 1 tablet (500 mg total) by mouth daily with breakfast.  Dispense: 30 tablet; Refill: 5 - CBC with Differential; Future - Comprehensive metabolic panel; Future - TSH; Future - Lipid Panel; Future  3. Other hyperlipidemia  - atorvastatin (LIPITOR)  40 MG tablet; Take 1 tablet (40 mg total) by mouth daily at 6 PM.  Dispense: 30 tablet; Refill: 3 - CBC with Differential; Future - Comprehensive metabolic panel; Future - TSH; Future - Lipid Panel; Future  4. Chronic systolic congestive heart failure (HCC) Continue with current medications   5. Non-seasonal allergic rhinitis due to pollen Start nasal steroid - fluticasone (FLONASE) 50 MCG/ACT nasal spray; Place 2 sprays into both nostrils daily.  Dispense: 16 g; Refill: 6  6. Carpal tunnel syndrome, unspecified laterality Patient advised to use wrist brace at night, NSAIDS prn  and ice to the area PRN.  - Ambulatory referral to Neurology  7. Chronic migraine Patient has been seen in the past for this.  - Ambulatory referral to Neurology   Medication: begin flonase 2 sprays once a day. [name]

## 2018-01-30 NOTE — Patient Instructions (Addendum)
I started you on Flonase for your allergy symptoms. I also added amlodipine to your bp medications.  Continue with all current medications. Referral to neurology put in for you today.   Hypertension Hypertension is another name for high blood pressure. High blood pressure forces your heart to work harder to pump blood. This can cause problems over time. There are two numbers in a blood pressure reading. There is a top number (systolic) over a bottom number (diastolic). It is best to have a blood pressure below 120/80. Healthy choices can help lower your blood pressure. You may need medicine to help lower your blood pressure if:  Your blood pressure cannot be lowered with healthy choices.  Your blood pressure is higher than 130/80.  Follow these instructions at home: Eating and drinking  If directed, follow the DASH eating plan. This diet includes: ? Filling half of your plate at each meal with fruits and vegetables. ? Filling one quarter of your plate at each meal with whole grains. Whole grains include whole wheat pasta, brown rice, and whole grain bread. ? Eating or drinking low-fat dairy products, such as skim milk or low-fat yogurt. ? Filling one quarter of your plate at each meal with low-fat (lean) proteins. Low-fat proteins include fish, skinless chicken, eggs, beans, and tofu. ? Avoiding fatty meat, cured and processed meat, or chicken with skin. ? Avoiding premade or processed food.  Eat less than 1,500 mg of salt (sodium) a day.  Limit alcohol use to no more than 1 drink a day for nonpregnant women and 2 drinks a day for men. One drink equals 12 oz of beer, 5 oz of wine, or 1 oz of hard liquor. Lifestyle  Work with your doctor to stay at a healthy weight or to lose weight. Ask your doctor what the best weight is for you.  Get at least 30 minutes of exercise that causes your heart to beat faster (aerobic exercise) most days of the week. This may include walking, swimming, or  biking.  Get at least 30 minutes of exercise that strengthens your muscles (resistance exercise) at least 3 days a week. This may include lifting weights or pilates.  Do not use any products that contain nicotine or tobacco. This includes cigarettes and e-cigarettes. If you need help quitting, ask your doctor.  Check your blood pressure at home as told by your doctor.  Keep all follow-up visits as told by your doctor. This is important. Medicines  Take over-the-counter and prescription medicines only as told by your doctor. Follow directions carefully.  Do not skip doses of blood pressure medicine. The medicine does not work as well if you skip doses. Skipping doses also puts you at risk for problems.  Ask your doctor about side effects or reactions to medicines that you should watch for. Contact a doctor if:  You think you are having a reaction to the medicine you are taking.  You have headaches that keep coming back (recurring).  You feel dizzy.  You have swelling in your ankles.  You have trouble with your vision. Get help right away if:  You get a very bad headache.  You start to feel confused.  You feel weak or numb.  You feel faint.  You get very bad pain in your: ? Chest. ? Belly (abdomen).  You throw up (vomit) more than once.  You have trouble breathing. Summary  Hypertension is another name for high blood pressure.  Making healthy choices can help lower  blood pressure. If your blood pressure cannot be controlled with healthy choices, you may need to take medicine. This information is not intended to replace advice given to you by your health care provider. Make sure you discuss any questions you have with your health care provider. Document Released: 12/20/2007 Document Revised: 05/31/2016 Document Reviewed: 05/31/2016 Elsevier Interactive Patient Education  [employer].

## 2018-02-13 DIAGNOSIS — E7849 Other hyperlipidemia: Secondary | ICD-10-CM

## 2018-02-13 DIAGNOSIS — R7303 Prediabetes: Secondary | ICD-10-CM

## 2018-02-13 DIAGNOSIS — I10 Essential (primary) hypertension: Secondary | ICD-10-CM

## 2018-02-14 LAB — CBC WITH DIFFERENTIAL/PLATELET
Basophils Absolute: 0 10*3/uL (ref 0.0–0.2)
Basos: 1 %
EOS (ABSOLUTE): 0.2 10*3/uL (ref 0.0–0.4)
Eos: 4 %
Hematocrit: 38.9 % (ref 34.0–46.6)
Hemoglobin: 11.9 g/dL (ref 11.1–15.9)
Immature Grans (Abs): 0 10*3/uL (ref 0.0–0.1)
Immature Granulocytes: 0 %
Lymphocytes Absolute: 1.7 10*3/uL (ref 0.7–3.1)
Lymphs: 36 %
MCH: 24 pg — ABNORMAL LOW (ref 26.6–33.0)
MCHC: 30.6 g/dL — ABNORMAL LOW (ref 31.5–35.7)
MCV: 78 fL — ABNORMAL LOW (ref 79–97)
Monocytes Absolute: 0.4 10*3/uL (ref 0.1–0.9)
Monocytes: 9 %
Neutrophils Absolute: 2.4 10*3/uL (ref 1.4–7.0)
Neutrophils: 50 %
Platelets: 270 10*3/uL (ref 150–450)
RBC: 4.96 x10E6/uL (ref 3.77–5.28)
RDW: 14.8 % (ref 12.3–15.4)
WBC: 4.7 10*3/uL (ref 3.4–10.8)

## 2018-02-14 LAB — COMPREHENSIVE METABOLIC PANEL
ALT: 12 IU/L (ref 0–32)
AST: 19 IU/L (ref 0–40)
Albumin/Globulin Ratio: 1.1 — ABNORMAL LOW (ref 1.2–2.2)
Albumin: 4.1 g/dL (ref 3.5–5.5)
Alkaline Phosphatase: 106 IU/L (ref 39–117)
BUN/Creatinine Ratio: 11 (ref 9–23)
BUN: 10 mg/dL (ref 6–24)
Bilirubin Total: 0.4 mg/dL (ref 0.0–1.2)
CO2: 27 mmol/L (ref 20–29)
Calcium: 9.8 mg/dL (ref 8.7–10.2)
Chloride: 98 mmol/L (ref 96–106)
Creatinine, Ser: 0.88 mg/dL (ref 0.57–1.00)
GFR calc Af Amer: 85 mL/min/{1.73_m2} (ref >59)
GFR calc non Af Amer: 74 mL/min/{1.73_m2} (ref >59)
Globulin, Total: 3.8 g/dL (ref 1.5–4.5)
Glucose: 100 mg/dL — ABNORMAL HIGH (ref 65–99)
Potassium: 3.9 mmol/L (ref 3.5–5.2)
Sodium: 139 mmol/L (ref 134–144)
Total Protein: 7.9 g/dL (ref 6.0–8.5)

## 2018-02-14 LAB — LIPID PANEL
Chol/HDL Ratio: 2.7 ratio (ref 0.0–4.4)
Cholesterol, Total: 137 mg/dL (ref 100–199)
HDL: 51 mg/dL (ref >39)
LDL Calculated: 72 mg/dL (ref 0–99)
Triglycerides: 72 mg/dL (ref 0–149)
VLDL Cholesterol Cal: 14 mg/dL (ref 5–40)

## 2018-02-14 LAB — TSH: TSH: 3.8 u[IU]/mL (ref 0.450–4.500)

## 2018-02-19 MED FILL — ?CARVEDILOL 6.25 MG TABLET: 6.25 | 30 days supply | Qty: 60 | Fill #1

## 2018-02-20 ENCOUNTER — Ambulatory Visit: Payer: Self-pay

## 2018-02-20 VITALS — BP 140/88

## 2018-02-20 DIAGNOSIS — I10 Essential (primary) hypertension: Secondary | ICD-10-CM

## 2018-03-01 MED FILL — ?AMLODIPINE BESYLATE 5 MG T: 5 MG | 30 days supply | Qty: 30 | Fill #1

## 2018-03-01 MED FILL — ?ATORVASTATIN 40MG TABLET: 40 | 30 days supply | Qty: 30 | Fill #3

## 2018-03-11 MED FILL — metFORMIN HCL 500 MG TABS: 500 | 30 days supply | Qty: 30 | Fill #0

## 2018-03-21 MED FILL — ?CARVEDILOL 6.25 MG TABLET: 6.25 | 30 days supply | Qty: 60 | Fill #2

## 2018-03-21 MED FILL — SPIRONOLACTONE 25 MG TABLET: 25 | 30 days supply | Qty: 30 | Fill #1

## 2018-03-29 MED FILL — ?AMLODIPINE BESYLATE 5 MG T: 5 MG | 30 days supply | Qty: 30 | Fill #2

## 2018-04-04 MED FILL — ATORVASTATIN CALCIUM 40 MG: 40 | 30 days supply | Qty: 30 | Fill #0

## 2018-04-08 MED FILL — ?METFORMIN HCL 500MG TABS: 500 | 30 days supply | Qty: 30 | Fill #1

## 2018-04-18 MED FILL — ?CARVEDILOL 6.25 MG TABLET: 6.25 | 30 days supply | Qty: 60 | Fill #3

## 2018-04-18 MED FILL — SPIRONOLACTONE 25 MG TABLET: 25 | 30 days supply | Qty: 30 | Fill #2

## 2018-05-01 MED FILL — ?ATORVASTATIN 40MG TABLET: 40 | 30 days supply | Qty: 30 | Fill #1

## 2018-05-01 MED FILL — ?AMLODIPINE BESYLATE 5 MG T: 5 MG | 30 days supply | Qty: 30 | Fill #3

## 2018-05-14 MED FILL — SPIRONOLACTONE 25 MG TABLET: 25 | 30 days supply | Qty: 30 | Fill #3

## 2018-05-14 MED FILL — ?METFORMIN HCL 500MG TABL: 500 | 30 days supply | Qty: 30 | Fill #2

## 2018-05-22 MED FILL — ?CARVEDILOL 6.25 MG TABLET: 6.25 | 30 days supply | Qty: 60 | Fill #4

## 2018-05-30 MED FILL — ?AMLODIPINE BESYLATE 5 MG T: 5 MG | 30 days supply | Qty: 30 | Fill #4

## 2018-05-30 MED FILL — ?ATORVASTATIN 40MG TABLET: 40 | 30 days supply | Qty: 30 | Fill #2

## 2018-06-03 ENCOUNTER — Encounter (HOSPITAL_BASED_OUTPATIENT_CLINIC_OR_DEPARTMENT_OTHER): Payer: Self-pay | Admitting: *Deleted

## 2018-06-03 ENCOUNTER — Other Ambulatory Visit: Payer: Self-pay

## 2018-06-03 ENCOUNTER — Emergency Department (HOSPITAL_BASED_OUTPATIENT_CLINIC_OR_DEPARTMENT_OTHER): Payer: Medicaid Other

## 2018-06-03 ENCOUNTER — Emergency Department (HOSPITAL_BASED_OUTPATIENT_CLINIC_OR_DEPARTMENT_OTHER)
Admission: EM | Admit: 2018-06-03 | Discharge: 2018-06-03 | Disposition: A | Discharge status: Home / Self Care | Payer: Medicaid Other | Attending: Emergency Medicine | Admitting: Emergency Medicine

## 2018-06-03 DIAGNOSIS — G43809 Other migraine, not intractable, without status migrainosus: Secondary | ICD-10-CM | POA: Insufficient documentation

## 2018-06-03 DIAGNOSIS — I110 Hypertensive heart disease with heart failure: Secondary | ICD-10-CM | POA: Diagnosis not present

## 2018-06-03 DIAGNOSIS — Z7982 Long term (current) use of aspirin: Secondary | ICD-10-CM | POA: Diagnosis not present

## 2018-06-03 DIAGNOSIS — R7303 Prediabetes: Secondary | ICD-10-CM | POA: Insufficient documentation

## 2018-06-03 DIAGNOSIS — I259 Chronic ischemic heart disease, unspecified: Secondary | ICD-10-CM | POA: Diagnosis not present

## 2018-06-03 DIAGNOSIS — Z79899 Other long term (current) drug therapy: Secondary | ICD-10-CM | POA: Insufficient documentation

## 2018-06-03 DIAGNOSIS — I509 Heart failure, unspecified: Secondary | ICD-10-CM | POA: Diagnosis not present

## 2018-06-03 DIAGNOSIS — R0602 Shortness of breath: Secondary | ICD-10-CM | POA: Diagnosis present

## 2018-06-03 DIAGNOSIS — R42 Dizziness and giddiness: Secondary | ICD-10-CM | POA: Diagnosis not present

## 2018-06-03 LAB — COMPREHENSIVE METABOLIC PANEL
ALT: 15 U/L (ref 0–44)
AST: 21 U/L (ref 15–41)
Albumin: 3.8 g/dL (ref 3.5–5.0)
Alkaline Phosphatase: 83 U/L (ref 38–126)
Anion gap: 8 (ref 5–15)
BUN: 12 mg/dL (ref 6–20)
CO2: 30 mmol/L (ref 22–32)
Calcium: 9.2 mg/dL (ref 8.9–10.3)
Chloride: 100 mmol/L (ref 98–111)
Creatinine, Ser: 0.84 mg/dL (ref 0.44–1.00)
GFR calc Af Amer: >60 mL/min (ref >60)
GFR calc non Af Amer: >60 mL/min (ref >60)
Glucose, Bld: 93 mg/dL (ref 70–99)
Potassium: 3.4 mmol/L — ABNORMAL LOW (ref 3.5–5.1)
Sodium: 138 mmol/L (ref 135–145)
Total Bilirubin: 0.5 mg/dL (ref 0.3–1.2)
Total Protein: 8.2 g/dL — ABNORMAL HIGH (ref 6.5–8.1)

## 2018-06-03 LAB — CBC WITH DIFFERENTIAL/PLATELET
Abs Immature Granulocytes: 0.02 10*3/uL (ref 0.00–0.07)
Basophils Absolute: 0 10*3/uL (ref 0.0–0.1)
Basophils Relative: 0 %
Eosinophils Absolute: 0.2 10*3/uL (ref 0.0–0.5)
Eosinophils Relative: 4 %
HCT: 35.7 % — ABNORMAL LOW (ref 36.0–46.0)
Hemoglobin: 11.3 g/dL — ABNORMAL LOW (ref 12.0–15.0)
Immature Granulocytes: 0 %
Lymphocytes Relative: 34 %
Lymphs Abs: 1.8 10*3/uL (ref 0.7–4.0)
MCH: 25.1 pg — ABNORMAL LOW (ref 26.0–34.0)
MCHC: 31.7 g/dL (ref 30.0–36.0)
MCV: 79.3 fL — ABNORMAL LOW (ref 80.0–100.0)
Monocytes Absolute: 0.5 10*3/uL (ref 0.1–1.0)
Monocytes Relative: 10 %
Neutro Abs: 2.7 10*3/uL (ref 1.7–7.7)
Neutrophils Relative %: 52 %
Platelets: 307 10*3/uL (ref 150–400)
RBC: 4.5 MIL/uL (ref 3.87–5.11)
RDW: 14.7 % (ref 11.5–15.5)
WBC: 5.2 10*3/uL (ref 4.0–10.5)
nRBC: 0 % (ref 0.0–0.2)

## 2018-06-03 LAB — BRAIN NATRIURETIC PEPTIDE: B Natriuretic Peptide: 32.6 pg/mL (ref 0.0–100.0)

## 2018-06-03 LAB — TROPONIN I: Troponin I: <0.03 ng/mL (ref <0.03)

## 2018-06-03 MED ORDER — PROCHLORPERAZINE EDISYLATE 10 MG/2ML IJ SOLN
10.0000 mg | Freq: Once | INTRAMUSCULAR | Status: AC
  Administered 2018-06-03: 10 mg via INTRAVENOUS
  Filled 2018-06-03: qty 2

## 2018-06-03 MED ORDER — DIPHENHYDRAMINE HCL 25 MG PO CAPS
25.0000 mg | ORAL_CAPSULE | Freq: Once | ORAL | Status: AC
  Administered 2018-06-03: 25 mg via ORAL
  Filled 2018-06-03: qty 1

## 2018-06-03 NOTE — ED Provider Notes (Signed)
[facility] EMERGENCY DEPARTMENT Provider Note   CSN: [id number] Arrival date & time: 06/03/18  1214     History   Chief Complaint Chief Complaint  Patient presents with  . Shortness of Breath  . Dizziness    HPI Hannah Huerta is a 57 y.o. female.  The history is provided by the patient.  Shortness of Breath  This is a chronic problem. The problem occurs intermittently.The current episode started more than 1 week ago. The problem has been gradually worsening. Associated symptoms include headaches (migraine hx, mild) and leg swelling. Pertinent negatives include no fever, no sore throat, no swollen glands, no ear pain, no neck pain, no cough, no sputum production, no wheezing, no PND, no orthopnea, no chest pain, no vomiting, no abdominal pain and no rash. It is unknown what precipitated the problem. She has tried nothing (fluid pill) for the symptoms. The treatment provided mild relief. She has had prior ED visits. Associated medical issues include CAD (non-obstructive) and heart failure.    Past Medical History:  Diagnosis Date  . Abnormal Pap smear of cervix    pt couldnt state what type of abnormality  . Anemia   . CAD in native artery 06/05/2015  . Chest pain    a. 02/2012 abnl myoview - inferoapical reverisbility concerning for ischemia, EF 59%;   02/2012 nonobstructive cath  . CHF (congestive heart failure) ([place])   . Chronic migraine   . GERD (gastroesophageal reflux disease)   . HLD (hyperlipidemia)   . Hypertension   . Hypokalemia 06/05/2015  . Metabolic syndrome 09/38/1829  . Morbid obesity with BMI of 50.0-59.9, adult ([place])   . Prediabetes   . S/P cardiac catheterization, non obstructive disease 06/04/15 06/05/2015  . Sickle cell trait ([place])   . Syncope     Patient Active Problem List   Diagnosis Date Noted  . CHF (congestive heart failure) ([place]) 01/30/2018  . OSA (obstructive sleep apnea) 03/01/2017  . ARF (acute renal failure) ([place]) 02/28/2017    . Hyponatremia 02/28/2017  . Syncope 07/23/2016  . Syncope and collapse 07/23/2016  . S/P cardiac catheterization, non obstructive disease 06/04/15 06/05/2015  . CAD in native artery 06/05/2015  . Fever 06/05/2015  . Hypokalemia 06/05/2015  . Metabolic syndrome 93/71/6967  . Chest pain, neg MI, possible GI 06/04/2015  . Colon cancer screening 08/05/2014  . Severe obesity (BMI >= 40) ([place]) 08/05/2014  . Health care maintenance 05/13/2014  . Prediabetes 05/13/2014  . Chronic migraine 04/27/2014  . Atypical chest pain 04/27/2014  . SOB (shortness of breath) 09/16/2013  . Palpitations 09/16/2013  . HTN (hypertension) 03/09/2012  . Hyperlipidemia 03/09/2012  . Microcytic anemia   . Morbid obesity with BMI of 50.0-59.9, adult ([place])   . Precordial chest pain 03/06/2012  . Dizziness 03/06/2012    Past Surgical History:  Procedure Laterality Date  . BACK SURGERY     x1 lumbar fusion  . CARDIAC CATHETERIZATION N/A 06/04/2015   Procedure: Left Heart Cath and Coronary Angiography;  Surgeon: [name], MD;  Location: [facility];  Service: Cardiovascular;  Laterality: N/A;  . CHOLECYSTECTOMY     laparoscopic  . COLONOSCOPY WITH PROPOFOL N/A 09/04/2014   Procedure: COLONOSCOPY WITH PROPOFOL;  Surgeon: [name], MD;  Location: [facility];  Service: Endoscopy;  Laterality: N/A;  . LEFT HEART CATHETERIZATION WITH CORONARY ANGIOGRAM N/A 03/08/2012   Procedure: LEFT HEART CATHETERIZATION WITH CORONARY ANGIOGRAM;  Surgeon: Hillary Bow, MD;  Location: [facility]  CATH LAB;  Service: Cardiovascular;  Laterality: N/A;  . SPINE SURGERY     fusion  . TUBAL LIGATION       OB History    Gravida  5   Para  3   Term      Preterm      AB  2   Living  3     SAB      TAB  2   Ectopic      Multiple      Live Births               Home Medications    Prior to Admission medications   Medication Sig Start Date End Date Taking? Authorizing Provider  amLODipine  (NORVASC) 5 MG tablet Take 1 tablet (5 mg total) by mouth daily. 01/30/18  Yes Lanae Boast, FNP  aspirin EC 81 MG tablet Take 1 tablet (81 mg total) by mouth daily. 03/07/17  Yes Dorena Dew, FNP  atorvastatin (LIPITOR) 40 MG tablet Take 1 tablet (40 mg total) by mouth daily at 6 PM. 01/30/18  Yes Lanae Boast, FNP  carvedilol (COREG) 6.25 MG tablet TAKE 1 TABLET BY MOUTH 2 TIMES DAILY WITH A MEAL. 01/30/18  Yes Lanae Boast, FNP  fluticasone (FLONASE) 50 MCG/ACT nasal spray Place 2 sprays into both nostrils daily. 01/30/18  Yes Lanae Boast, FNP  metFORMIN (GLUCOPHAGE) 500 MG tablet Take 1 tablet (500 mg total) by mouth daily with breakfast. 01/30/18  Yes Lanae Boast, FNP  spironolactone (ALDACTONE) 25 MG tablet Take 1 tablet (25 mg total) by mouth daily. 01/30/18 03/01/18  Lanae Boast, FNP    Family History Family History  Problem Relation Age of Onset  . Coronary artery disease Father 38  . Heart disease Father   . Other Father        died in a MVA  . Hypertension Father   . Sudden death Mother 45       Questionable MI  . Diabetes Mother   . Hypertension Mother   . Hypertension Sister   . Pancreatic cancer Maternal Aunt   . Breast cancer Maternal Aunt        after age 58  . Stroke Paternal Grandmother   . Colon polyps Sister   . Colon cancer Neg Hx   . Gallbladder disease Neg Hx   . Esophageal cancer Neg Hx     Social History Social History   Tobacco Use  . Smoking status: Never Smoker  . Smokeless tobacco: Never Used  Substance Use Topics  . Alcohol use: No  . Drug use: No     Allergies   Patient has no known allergies.   Review of Systems Review of Systems  Constitutional: Negative for chills and fever.  HENT: Negative for ear pain and sore throat.   Eyes: Negative for pain and visual disturbance.  Respiratory: Positive for shortness of breath. Negative for cough, sputum production and wheezing.   Cardiovascular: Positive for leg swelling.  Negative for chest pain, palpitations, orthopnea and PND.  Gastrointestinal: Negative for abdominal pain and vomiting.  Genitourinary: Negative for dysuria and hematuria.  Musculoskeletal: Negative for arthralgias, back pain and neck pain.  Skin: Negative for color change and rash.  Neurological: Positive for dizziness (resolved) and headaches (migraine hx, mild). Negative for seizures and syncope.  All other systems reviewed and are negative.    Physical Exam Updated Vital Signs  ED Triage Vitals  Enc Vitals Group  BP 06/03/18 1224 (!) 163/102     Pulse Rate 06/03/18 1224 79     Resp 06/03/18 1224 20     Temp 06/03/18 1224 98.6 F (37 C)     Temp Source 06/03/18 1224 Oral     SpO2 06/03/18 1224 97 %     Weight 06/03/18 1221 292 lb (132.5 kg)     Height 06/03/18 1221 5\' 1"  (1.549 m)     Head Circumference --      Peak Flow --      Pain Score 06/03/18 1220 8     Pain Loc --      Pain Edu? --      Excl. in [place]? --     Physical Exam  Constitutional: She appears well-developed and well-nourished. No distress.  HENT:  Head: Normocephalic and atraumatic.  Eyes: Pupils are equal, round, and reactive to light. Conjunctivae and EOM are normal.  Neck: Normal range of motion. Neck supple.  Cardiovascular: Normal rate and regular rhythm.  No murmur heard. Pulmonary/Chest: Effort normal and breath sounds normal. No respiratory distress. She has no decreased breath sounds. She has no wheezes. She has no rhonchi. She has no rales.  Abdominal: Soft. There is no tenderness.  Musculoskeletal:       Right lower leg: She exhibits edema.       Left lower leg: She exhibits edema.  Neurological: She is alert.  5+ out of 5 strength throughout, normal sensation, normal finger-to-nose finger, no drift  Skin: Skin is warm and dry.  Psychiatric: She has a normal mood and affect.  Nursing note and vitals reviewed.    ED Treatments / Results  Labs (all labs ordered are listed, but only  abnormal results are displayed) Labs Reviewed  CBC WITH DIFFERENTIAL/PLATELET - Abnormal; Notable for the following components:      Result Value   Hemoglobin 11.3 (*)    HCT 35.7 (*)    MCV 79.3 (*)    MCH 25.1 (*)    All other components within normal limits  COMPREHENSIVE METABOLIC PANEL - Abnormal; Notable for the following components:   Potassium 3.4 (*)    Total Protein 8.2 (*)    All other components within normal limits  TROPONIN I  BRAIN NATRIURETIC PEPTIDE    EKG EKG Interpretation  Date/Time:  Monday June 03 2018 12:25:16 EST Ventricular Rate:  78 PR Interval:  168 QRS Duration: 84 QT Interval:  380 QTC Calculation: 433 R Axis:   -40 Text Interpretation:  Normal sinus rhythm Left axis deviation No significant change since last tracing Confirmed by Lennice Sites [id number]) on 06/03/2018 3:46:11 PM   Radiology Dg Chest 2 View  Result Date: 06/03/2018 CLINICAL DATA:  Shortness of breath, headaches for 2 weeks EXAM: CHEST - 2 VIEW COMPARISON:  08/22/2017 FINDINGS: The heart size and mediastinal contours are within normal limits. Both lungs are clear. The visualized skeletal structures are unremarkable. IMPRESSION: No active cardiopulmonary disease. Electronically Signed   By: Kathreen Devoid   On: 06/03/2018 16:32    Procedures Procedures (including critical care time)  Medications Ordered in ED Medications  prochlorperazine (COMPAZINE) injection 10 mg (10 mg Intravenous Given 06/03/18 1609)  diphenhydrAMINE (BENADRYL) capsule 25 mg (25 mg Oral Given 06/03/18 1607)     Initial Impression / Assessment and Plan / ED Course  I have reviewed the triage vital signs and the nursing notes.  Pertinent labs & imaging results that were available during my care of the  patient were reviewed by me and considered in my medical decision making (see chart for details).     Hannah Huerta is a 57 year old female with history of nonobstructive CAD, heart failure, migraines,  hypertension who presents to the ED with migraine headache, generalized weakness, shortness of breath.  Patient with normal vitals.  No fever.  Patient denies any chest pain, abdominal pain.  No weakness or other neurological symptoms.  No vision changes.  Patient with intermittent shortness of breath that she states is normal for her but may be slightly worse.  Denies any cough, congestion, sputum production.  Denies any orthopnea.  Patient has no DVT or PE risk factors.  Doubt PE.  Patient with EKG that shows sinus rhythm no signs of ischemic changes.  Troponin within normal limits.  Patient with no chest pain and no concern for cardiac process.  BNP within normal limits, chest x-ray without any signs of pneumonia, pleural effusion, pneumothorax.  Doubt volume overload, doubt heart failure exacerbation.  Patient with no significant anemia, electrolyte abnormality, kidney injury.  Patient with normal neurological exam.  Normal vision.  Suspect patient likely with migraine headache.  Gradual in nature.  Headache improved with IV Compazine and Benadryl.  Overall reassuring work-up.  Likely patient with mild headache recommend continued use of Tylenol/Motrin at home and was discharged from ED in good condition.  Recommend follow-up with primary care doctor.  Given return precautions.  This chart was dictated using voice recognition software.  Despite best efforts to proofread,  errors can occur which can change the documentation meaning.   Final Clinical Impressions(s) / ED Diagnoses   Final diagnoses:  Other migraine without status migrainosus, not intractable  SOB (shortness of breath)    ED Discharge Orders    None       Lennice Sites, DO 06/03/18 1739

## 2018-06-03 NOTE — ED Triage Notes (Signed)
Dizziness and SOB x 2 weeks. Today she has had a headache and nausea.

## 2018-06-05 ENCOUNTER — Ambulatory Visit: Payer: Self-pay | Admitting: Family Medicine

## 2018-06-11 MED FILL — ?METFORMIN HCL 500MG TABS: 500 | 30 days supply | Qty: 30 | Fill #3

## 2018-06-12 ENCOUNTER — Encounter: Payer: Self-pay | Admitting: Family Medicine

## 2018-06-12 ENCOUNTER — Ambulatory Visit (INDEPENDENT_AMBULATORY_CARE_PROVIDER_SITE_OTHER): Payer: Self-pay | Admitting: Family Medicine

## 2018-06-12 VITALS — BP 133/76 | HR 77 | Temp 98.9°F | Resp 16 | Ht 62.0 in | Wt 294.0 lb

## 2018-06-12 DIAGNOSIS — M79641 Pain in right hand: Secondary | ICD-10-CM

## 2018-06-12 DIAGNOSIS — M79642 Pain in left hand: Secondary | ICD-10-CM

## 2018-06-12 DIAGNOSIS — I10 Essential (primary) hypertension: Secondary | ICD-10-CM

## 2018-06-12 DIAGNOSIS — R29898 Other symptoms and signs involving the musculoskeletal system: Secondary | ICD-10-CM

## 2018-06-12 MED ORDER — CARVEDILOL 6.25 MG PO TABS: ORAL_TABLET | ORAL | 5 refills | Status: DC

## 2018-06-12 NOTE — Patient Instructions (Signed)

## 2018-06-12 NOTE — Progress Notes (Signed)
Established Patient Office Visit  Subjective:  Patient ID: Hannah Huerta, female    DOB: [birth year]  Age: 57 y.o. MRN: [id number]  CC:  Chief Complaint  Patient presents with  . Cough  . Hand Pain    stiffiness   . Extremity Weakness    right leg     HPI Hannah Huerta presents for 1. Cough- green sputum x 3 weeks.  Hand pain- left hand x several years. Hard to bend and knot middle finger Patient was seen 06/03/2018 in the ED for migraines. Patient states that she had decreased right lower extremity strength on examination and was instructed to follow up with PCP.   Past Medical History:  Diagnosis Date  . Abnormal Pap smear of cervix    pt couldnt state what type of abnormality  . Anemia   . CAD in native artery 06/05/2015  . Chest pain    a. 02/2012 abnl myoview - inferoapical reverisbility concerning for ischemia, EF 59%;   02/2012 nonobstructive cath  . CHF (congestive heart failure) ([place])   . Chronic migraine   . GERD (gastroesophageal reflux disease)   . HLD (hyperlipidemia)   . Hypertension   . Hypokalemia 06/05/2015  . Metabolic syndrome 15/40/0867  . Morbid obesity with BMI of 50.0-59.9, adult ([place])   . Prediabetes   . S/P cardiac catheterization, non obstructive disease 06/04/15 06/05/2015  . Sickle cell trait ([place])   . Syncope     Past Surgical History:  Procedure Laterality Date  . BACK SURGERY     x1 lumbar fusion  . CARDIAC CATHETERIZATION N/A 06/04/2015   Procedure: Left Heart Cath and Coronary Angiography;  Surgeon: [name], MD;  Location: [facility];  Service: Cardiovascular;  Laterality: N/A;  . CHOLECYSTECTOMY     laparoscopic  . COLONOSCOPY WITH PROPOFOL N/A 09/04/2014   Procedure: COLONOSCOPY WITH PROPOFOL;  Surgeon: [name], MD;  Location: [facility];  Service: Endoscopy;  Laterality: N/A;  . LEFT HEART CATHETERIZATION WITH CORONARY ANGIOGRAM N/A 03/08/2012   Procedure: LEFT HEART CATHETERIZATION WITH CORONARY ANGIOGRAM;   Surgeon: Hillary Bow, MD;  Location: [facility] CATH LAB;  Service: Cardiovascular;  Laterality: N/A;  . SPINE SURGERY     fusion  . TUBAL LIGATION      Family History  Problem Relation Age of Onset  . Coronary artery disease Father 23  . Heart disease Father   . Other Father        died in a MVA  . Hypertension Father   . Sudden death Mother 62       Questionable MI  . Diabetes Mother   . Hypertension Mother   . Hypertension Sister   . Pancreatic cancer Maternal Aunt   . Breast cancer Maternal Aunt        after age 35  . Stroke Paternal Grandmother   . Colon polyps Sister   . Colon cancer Neg Hx   . Gallbladder disease Neg Hx   . Esophageal cancer Neg Hx     Social History   Socioeconomic History  . Marital status: Married    Spouse name: Not on file  . Number of children: 3  . Years of education: Not on file  . Highest education level: Not on file  Occupational History  . Occupation: Works in the Estate manager/land agent: [employer]  . Financial resource strain: Not on file  . Food insecurity:    Worry:  Not on file    Inability: Not on file  . Transportation needs:    Medical: Not on file    Non-medical: Not on file  Tobacco Use  . Smoking status: Never Smoker  . Smokeless tobacco: Never Used  Substance and Sexual Activity  . Alcohol use: No  . Drug use: No  . Sexual activity: Yes    Birth control/protection: Surgical  Lifestyle  . Physical activity:    Days per week: Not on file    Minutes per session: Not on file  . Stress: Not on file  Relationships  . Social connections:    Talks on phone: Not on file    Gets together: Not on file    Attends religious service: Not on file    Active member of club or organization: Not on file    Attends meetings of clubs or organizations: Not on file    Relationship status: Not on file  . Intimate partner violence:    Fear of current or ex partner: Not on file    Emotionally abused: Not on file     Physically abused: Not on file    Forced sexual activity: Not on file  Other Topics Concern  . Not on file  Social History Narrative   Lives at home with husband and two of her sons.      Outpatient Medications Prior to Visit  Medication Sig Dispense Refill  . amLODipine (NORVASC) 5 MG tablet Take 1 tablet (5 mg total) by mouth daily. 90 tablet 3  . aspirin EC 81 MG tablet Take 1 tablet (81 mg total) by mouth daily. 30 tablet 11  . atorvastatin (LIPITOR) 40 MG tablet Take 1 tablet (40 mg total) by mouth daily at 6 PM. 30 tablet 3  . metFORMIN (GLUCOPHAGE) 500 MG tablet Take 1 tablet (500 mg total) by mouth daily with breakfast. 30 tablet 5  . spironolactone (ALDACTONE) 25 MG tablet Take 1 tablet (25 mg total) by mouth daily. 30 tablet 5  . carvedilol (COREG) 6.25 MG tablet TAKE 1 TABLET BY MOUTH 2 TIMES DAILY WITH A MEAL. 60 tablet 5  . fluticasone (FLONASE) 50 MCG/ACT nasal spray Place 2 sprays into both nostrils daily. (Patient not taking: Reported on 06/12/2018) 16 g 6   No facility-administered medications prior to visit.     No Known Allergies  ROS Review of Systems  Constitutional: Negative.   HENT: Negative.   Eyes: Negative.   Respiratory: Negative.   Cardiovascular: Negative.   Gastrointestinal: Negative.   Endocrine: Negative.   Genitourinary: Negative.   Musculoskeletal: Positive for arthralgias (bialteral hands) and joint swelling.  Skin: Negative.   Allergic/Immunologic: Negative.   Neurological: Negative.   Hematological: Negative.   Psychiatric/Behavioral: Negative.       Objective:    Physical Exam  Constitutional: She is oriented to person, place, and time. She appears well-developed and well-nourished. No distress.  HENT:  Head: Normocephalic and atraumatic.  Eyes: Pupils are equal, round, and reactive to light. Conjunctivae and EOM are normal.  Neck: Normal range of motion.  Cardiovascular: Normal rate, regular rhythm and normal heart sounds.   Pulmonary/Chest: Effort normal and breath sounds normal. No respiratory distress.  Musculoskeletal: Normal range of motion. She exhibits tenderness (bilateral hands. mild swelling noted to the MIP on the left 3rd digit. ).  Neurological: She is alert and oriented to person, place, and time.  Skin: Skin is warm and dry.  Psychiatric: She has a normal mood  and affect. Her behavior is normal. Judgment and thought content normal.  Nursing note and vitals reviewed.   BP 133/76 (BP Location: Left Arm, Patient Position: Sitting, Cuff Size: Large)   Pulse 77   Temp 98.9 F (37.2 C) (Oral)   Resp 16   Ht 5\' 2"  (1.575 m)   Wt 294 lb (133.4 kg)   LMP 09/15/2013   SpO2 98%   BMI 53.77 kg/m  Wt Readings from Last 3 Encounters:  06/12/18 294 lb (133.4 kg)  06/03/18 292 lb (132.5 kg)  01/30/18 294 lb (133.4 kg)     Health Maintenance Due  Topic Date Due  . MAMMOGRAM  05/19/2016  . PAP SMEAR  11/20/2016    There are no preventive care reminders to display for this patient.  Lab Results  Component Value Date   TSH 3.800 02/13/2018   Lab Results  Component Value Date   WBC 5.2 06/03/2018   HGB 11.3 (L) 06/03/2018   HCT 35.7 (L) 06/03/2018   MCV 79.3 (L) 06/03/2018   PLT 307 06/03/2018   Lab Results  Component Value Date   NA 138 06/03/2018   K 3.4 (L) 06/03/2018   CO2 30 06/03/2018   GLUCOSE 93 06/03/2018   BUN 12 06/03/2018   CREATININE 0.84 06/03/2018   BILITOT 0.5 06/03/2018   ALKPHOS 83 06/03/2018   AST 21 06/03/2018   ALT 15 06/03/2018   PROT 8.2 (H) 06/03/2018   ALBUMIN 3.8 06/03/2018   CALCIUM 9.2 06/03/2018   ANIONGAP 8 06/03/2018   Lab Results  Component Value Date   CHOL 137 02/13/2018   Lab Results  Component Value Date   HDL 51 02/13/2018   Lab Results  Component Value Date   LDLCALC 72 02/13/2018   Lab Results  Component Value Date   TRIG 72 02/13/2018   Lab Results  Component Value Date   CHOLHDL 2.7 02/13/2018   Lab Results  Component  Value Date   HGBA1C 6.0 (A) 01/30/2018      Assessment & Plan:   Problem List Items Addressed This Visit      Cardiovascular and Mediastinum   HTN (hypertension) - Primary   Relevant Medications   carvedilol (COREG) 6.25 MG tablet    Other Visit Diagnoses    Bilateral hand pain       Relevant Orders   Rheumatoid Arthritis Profile (Completed)   DG HAND 2 VIEWS BILAT   Weakness of right lower extremity       Relevant Orders   Nerve conduction test      Meds ordered this encounter  Medications  . carvedilol (COREG) 6.25 MG tablet    Sig: TAKE 1 TABLET BY MOUTH 2 TIMES DAILY WITH A MEAL.    Dispense:  60 tablet    Refill:  5    Follow-up: Return in about 3 months (around 09/12/2018).    Lanae Boast, FNP

## 2018-06-13 LAB — RHEUMATOID ARTHRITIS PROFILE
Cyclic Citrullin Peptide Ab: 114 units — ABNORMAL HIGH (ref 0–19)
Rhuematoid fact SerPl-aCnc: 68.2 IU/mL — ABNORMAL HIGH (ref 0.0–13.9)

## 2018-06-17 ENCOUNTER — Telehealth: Payer: Self-pay

## 2018-06-17 NOTE — Telephone Encounter (Signed)
-----   Message from Lanae Boast, [place] sent at 06/17/2018 11:01 AM EST ----- Her RA factor labs were abnormal. Will send referral for rheumatology. Please see if the patient has orange card or cone discount. If not, encourage her to obtain.

## 2018-06-17 NOTE — Telephone Encounter (Signed)
Called and spoke with patient, advised that we are sending her to rheumatology due to RA factor labs being abnormal. Patient verbalized understanding. I advised her to try to renew her orange card and try to obtain cone discount. Thanks!

## 2018-06-21 MED FILL — SPIRONOLACTONE 25 MG TABLET: 25 | 30 days supply | Qty: 30 | Fill #4

## 2018-06-21 MED FILL — ?CARVEDILOL 6.25 MG TABLET: 6.25 | 30 days supply | Qty: 60 | Fill #5

## 2018-06-24 ENCOUNTER — Ambulatory Visit: Payer: Self-pay | Attending: Family Medicine

## 2018-07-05 MED FILL — ?ATORVASTATIN 40MG TABLET: 40 | 30 days supply | Qty: 30 | Fill #3

## 2018-07-05 MED FILL — ?AMLODIPINE BESYLATE 5 MG T: 5 MG | 30 days supply | Qty: 30 | Fill #5

## 2018-07-15 MED FILL — SPIRONOLACTONE 25 MG TABLET: 25 | 30 days supply | Qty: 30 | Fill #5

## 2018-07-15 MED FILL — ?METFORMIN HCL 500MG TABLET: 500 | 30 days supply | Qty: 30 | Fill #4

## 2018-07-22 ENCOUNTER — Other Ambulatory Visit: Payer: Self-pay

## 2018-07-22 ENCOUNTER — Telehealth: Payer: Self-pay

## 2018-07-22 DIAGNOSIS — I10 Essential (primary) hypertension: Secondary | ICD-10-CM

## 2018-07-22 MED ORDER — CARVEDILOL 6.25 MG PO TABS: ORAL_TABLET | ORAL | 5 refills | Status: DC

## 2018-07-22 MED FILL — ?CARVEDILOL 6.25 MG TABLET: 6.25 | 30 days supply | Qty: 60 | Fill #0

## 2018-07-22 NOTE — Telephone Encounter (Signed)
Refill for losartan sent into pharmacy. Thanks!  

## 2018-08-13 ENCOUNTER — Other Ambulatory Visit: Payer: Self-pay

## 2018-08-13 DIAGNOSIS — E7849 Other hyperlipidemia: Secondary | ICD-10-CM

## 2018-08-13 MED ORDER — ATORVASTATIN CALCIUM 40 MG PO TABS: 40.0000 mg | ORAL_TABLET | Freq: Every day | ORAL | 3 refills | Status: DC

## 2018-08-13 MED FILL — ?ATORVASTATIN 40MG TABLET: 40 | 30 days supply | Qty: 30 | Fill #0

## 2018-08-15 MED FILL — ?METFORMIN HCL 500MG TABLET: 500 | 30 days supply | Qty: 30 | Fill #5

## 2018-08-15 MED FILL — AMLODIPINE BESYLATE 5 MG TA: 5 | 30 days supply | Qty: 30 | Fill #6

## 2018-08-30 ENCOUNTER — Other Ambulatory Visit: Payer: Self-pay | Admitting: Family Medicine

## 2018-08-30 DIAGNOSIS — I10 Essential (primary) hypertension: Secondary | ICD-10-CM

## 2018-08-30 MED FILL — CARVEDILOL 6.25 MG TABLET: 6.25 | 30 days supply | Qty: 60 | Fill #0

## 2018-08-30 MED FILL — SPIRONOLACTONE 25 MG TABLET: 25 | 30 days supply | Qty: 30 | Fill #0

## 2018-09-12 ENCOUNTER — Ambulatory Visit (INDEPENDENT_AMBULATORY_CARE_PROVIDER_SITE_OTHER): Payer: Self-pay | Admitting: Family Medicine

## 2018-09-12 ENCOUNTER — Encounter: Payer: Self-pay | Admitting: Family Medicine

## 2018-09-12 VITALS — BP 140/82 | HR 77 | Temp 99.0°F | Resp 18 | Ht 62.0 in | Wt 301.0 lb

## 2018-09-12 DIAGNOSIS — I10 Essential (primary) hypertension: Secondary | ICD-10-CM

## 2018-09-12 DIAGNOSIS — R7303 Prediabetes: Secondary | ICD-10-CM

## 2018-09-12 DIAGNOSIS — M2550 Pain in unspecified joint: Secondary | ICD-10-CM

## 2018-09-12 LAB — POCT GLYCOSYLATED HEMOGLOBIN (HGB A1C): Hemoglobin A1C: 6.2 % — AB (ref 4.0–5.6)

## 2018-09-12 LAB — POCT URINALYSIS DIPSTICK
Bilirubin, UA: NEGATIVE
Glucose, UA: NEGATIVE
Ketones, UA: NEGATIVE
Leukocytes, UA: NEGATIVE
Nitrite, UA: NEGATIVE
Protein, UA: POSITIVE — AB
Spec Grav, UA: 1.015 (ref 1.010–1.025)
Urobilinogen, UA: 0.2 E.U./dL
pH, UA: 6 (ref 5.0–8.0)

## 2018-09-12 MED ORDER — AMLODIPINE BESYLATE 5 MG PO TABS: 5.0000 mg | ORAL_TABLET | Freq: Every day | ORAL | 3 refills | Status: DC

## 2018-09-12 MED ORDER — METFORMIN HCL 500 MG PO TABS: 500.0000 mg | ORAL_TABLET | Freq: Every day | ORAL | 5 refills | Status: DC

## 2018-09-12 MED FILL — AMLODIPINE BESYLATE 5 MG TA: 5 | 30 days supply | Qty: 30 | Fill #0

## 2018-09-12 MED FILL — ?METFORMIN HCL 500MG TABL: 500 | 30 days supply | Qty: 30 | Fill #0

## 2018-09-12 NOTE — Progress Notes (Signed)
Patient [name]   Progress Note: General Provider: Lanae Boast, FNP  SUBJECTIVE:   Hannah Huerta is a 58 y.o. female who  has a past medical history of Abnormal Pap smear of cervix, Anemia, CAD in native artery (06/05/2015), Chest pain, CHF (congestive heart failure) ([place]), Chronic migraine, GERD (gastroesophageal reflux disease), HLD (hyperlipidemia), Hypertension, Hypokalemia (85/63/1497), Metabolic syndrome (02/63/7858), Morbid obesity with BMI of 50.0-59.9, adult ([place]), Prediabetes, S/P cardiac catheterization, non obstructive disease 06/04/15 (06/05/2015), Sickle cell trait ([place]), and Syncope.. Patient presents today for Hypertension and Follow-up (prediabetes ) Patient presents for follow-up on hypertension and prediabetes.  She reports compliance with medication.  She does not eat a heart healthy or low-sodium diet.  She does not exercise.  She is the main caregiver for her grandchildren.  States that she does not eat out a lot however she does eat highly processed foods.  She continues to have joint pain and swelling.  At her last visit in November 2019 her rheumatoid arthritis factor was positive.  She was referred to rheumatology, however she does not have insurance at the present time.  She was offered a discounted rate of $150 for her first visit, but she could not afford that at the present time.  She has a history of congestive heart failure.  She does not do daily weights.  She denies chest pain shortness of breath or dizziness.  She does endorse bilateral lower extremity swelling.  Review of Systems  Constitutional: Negative.   HENT: Negative.   Eyes: Negative.   Respiratory: Negative.   Cardiovascular: Positive for leg swelling.  Gastrointestinal: Negative.   Genitourinary: Negative.   Musculoskeletal: Positive for joint pain.  Skin: Negative.   Neurological: Negative.   Psychiatric/Behavioral: Negative.      OBJECTIVE: BP  140/82 (BP Location: Left Arm, Patient Position: Sitting, Cuff Size: Large) Comment: manually  Pulse 77   Temp 99 F (37.2 C) (Oral)   Resp 18   Ht 5\' 2"  (1.575 m)   Wt (!) 301 lb (136.5 kg)   LMP 09/15/2013   SpO2 98%   BMI 55.05 kg/m   Wt Readings from Last 3 Encounters:  09/12/18 (!) 301 lb (136.5 kg)  06/12/18 294 lb (133.4 kg)  06/03/18 292 lb (132.5 kg)     Physical Exam  ASSESSMENT/PLAN:   1. Essential hypertension No medication changes warranted at the present time.  We will continue to monitor this.  Discussed elevation of legs with seating. - Urinalysis Dipstick - amLODipine (NORVASC) 5 MG tablet; Take 1 tablet (5 mg total) by mouth daily.  Dispense: 90 tablet; Refill: 3 - Comprehensive metabolic panel  2. Prediabetes We discussed diet and exercise.  Discussed the DASH diet and decreasing sodium intake.  Discussed 150 minutes of cardio vascular activity each week.  Discussed eating fresh foods and vegetables whenever possible.  discussed watching sodium numbers on packages - HgB A1c - metFORMIN (GLUCOPHAGE) 500 MG tablet; Take 1 tablet (500 mg total) by mouth daily with breakfast.  Dispense: 30 tablet; Refill: 5  3. Arthralgia, unspecified joint We will continue to monitor.  Repeat rheumatoid arthritis profile today and ANA for further evaluation of joint pain. - Rheumatoid Arthritis Profile - ANA,IFA RA Diag Pnl w/rflx Tit/Patn     Return in about 3 months (around 12/11/2018) for HTN and joint pain.    The patient was given clear instructions to go to ER or return to medical center if symptoms do  not improve, worsen or new problems develop. The patient verbalized understanding and agreed with plan of care.   Ms. [name]. [name], FNP-BC [facility] [address] [place], [place] [address] [phone]

## 2018-09-12 NOTE — Patient Instructions (Addendum)
DASH Eating Plan DASH stands for "Dietary Approaches to Stop Hypertension." The DASH eating plan is a healthy eating plan that has been shown to reduce high blood pressure (hypertension). It may also reduce your risk for type 2 diabetes, heart disease, and stroke. The DASH eating plan may also help with weight loss. What are tips for following this plan?  General guidelines  Avoid eating more than 2,300 mg (milligrams) of salt (sodium) a day. If you have hypertension, you may need to reduce your sodium intake to 1,500 mg a day.  Limit alcohol intake to no more than 1 drink a day for nonpregnant women and 2 drinks a day for men. One drink equals 12 oz of beer, 5 oz of wine, or 1 oz of hard liquor.  Work with your health care provider to maintain a healthy body weight or to lose weight. Ask what an ideal weight is for you.  Get at least 30 minutes of exercise that causes your heart to beat faster (aerobic exercise) most days of the week. Activities may include walking, swimming, or biking.  Work with your health care provider or diet and nutrition specialist (dietitian) to adjust your eating plan to your individual calorie needs. Reading food labels   Check food labels for the amount of sodium per serving. Choose foods with less than 5 percent of the Daily Value of sodium. Generally, foods with less than 300 mg of sodium per serving fit into this eating plan.  To find whole grains, look for the word "whole" as the first word in the ingredient list. Shopping  Buy products labeled as "low-sodium" or "no salt added."  Buy fresh foods. Avoid canned foods and premade or frozen meals. Cooking  Avoid adding salt when cooking. Use salt-free seasonings or herbs instead of table salt or sea salt. Check with your health care provider or pharmacist before using salt substitutes.  Do not fry foods. Cook foods using healthy methods such as baking, boiling, grilling, and broiling instead.  Cook with  heart-healthy oils, such as olive, canola, soybean, or sunflower oil. Meal planning  Eat a balanced diet that includes: ? 5 or more servings of fruits and vegetables each day. At each meal, try to fill half of your plate with fruits and vegetables. ? Up to 6-8 servings of whole grains each day. ? Less than 6 oz of lean meat, poultry, or fish each day. A 3-oz serving of meat is about the same size as a deck of cards. One egg equals 1 oz. ? 2 servings of low-fat dairy each day. ? A serving of nuts, seeds, or beans 5 times each week. ? Heart-healthy fats. Healthy fats called Omega-3 fatty acids are found in foods such as flaxseeds and coldwater fish, like sardines, salmon, and mackerel.  Limit how much you eat of the following: ? Canned or prepackaged foods. ? Food that is high in trans fat, such as fried foods. ? Food that is high in saturated fat, such as fatty meat. ? Sweets, desserts, sugary drinks, and other foods with added sugar. ? Full-fat dairy products.  Do not salt foods before eating.  Try to eat at least 2 vegetarian meals each week.  Eat more home-cooked food and less restaurant, buffet, and fast food.  When eating at a restaurant, ask that your food be prepared with less salt or no salt, if possible. What foods are recommended? The items listed may not be a complete list. Talk with your dietitian about   what dietary choices are best for you. Grains Whole-grain or whole-wheat bread. Whole-grain or whole-wheat pasta. Brown rice. Oatmeal. Quinoa. Bulgur. Whole-grain and low-sodium cereals. Pita bread. Low-fat, low-sodium crackers. Whole-wheat flour tortillas. Vegetables Fresh or frozen vegetables (raw, steamed, roasted, or grilled). Low-sodium or reduced-sodium tomato and vegetable juice. Low-sodium or reduced-sodium tomato sauce and tomato paste. Low-sodium or reduced-sodium canned vegetables. Fruits All fresh, dried, or frozen fruit. Canned fruit in natural juice (without  added sugar). Meat and other protein foods Skinless chicken or turkey. Ground chicken or turkey. Pork with fat trimmed off. Fish and seafood. Egg whites. Dried beans, peas, or lentils. Unsalted nuts, nut butters, and seeds. Unsalted canned beans. Lean cuts of beef with fat trimmed off. Low-sodium, lean deli meat. Dairy Low-fat (1%) or fat-free (skim) milk. Fat-free, low-fat, or reduced-fat cheeses. Nonfat, low-sodium ricotta or cottage cheese. Low-fat or nonfat yogurt. Low-fat, low-sodium cheese. Fats and oils Soft margarine without trans fats. Vegetable oil. Low-fat, reduced-fat, or light mayonnaise and salad dressings (reduced-sodium). Canola, safflower, olive, soybean, and sunflower oils. Avocado. Seasoning and other foods Herbs. Spices. Seasoning mixes without salt. Unsalted popcorn and pretzels. Fat-free sweets. What foods are not recommended? The items listed may not be a complete list. Talk with your dietitian about what dietary choices are best for you. Grains Baked goods made with fat, such as croissants, muffins, or some breads. Dry pasta or rice meal packs. Vegetables Creamed or fried vegetables. Vegetables in a cheese sauce. Regular canned vegetables (not low-sodium or reduced-sodium). Regular canned tomato sauce and paste (not low-sodium or reduced-sodium). Regular tomato and vegetable juice (not low-sodium or reduced-sodium). Pickles. Olives. Fruits Canned fruit in a light or heavy syrup. Fried fruit. Fruit in cream or butter sauce. Meat and other protein foods Fatty cuts of meat. Ribs. Fried meat. Bacon. Sausage. Bologna and other processed lunch meats. Salami. Fatback. Hotdogs. Bratwurst. Salted nuts and seeds. Canned beans with added salt. Canned or smoked fish. Whole eggs or egg yolks. Chicken or turkey with skin. Dairy Whole or 2% milk, cream, and half-and-half. Whole or full-fat cream cheese. Whole-fat or sweetened yogurt. Full-fat cheese. Nondairy creamers. Whipped toppings.  Processed cheese and cheese spreads. Fats and oils Butter. Stick margarine. Lard. Shortening. Ghee. Bacon fat. Tropical oils, such as coconut, palm kernel, or palm oil. Seasoning and other foods Salted popcorn and pretzels. Onion salt, garlic salt, seasoned salt, table salt, and sea salt. Worcestershire sauce. Tartar sauce. Barbecue sauce. Teriyaki sauce. Soy sauce, including reduced-sodium. Steak sauce. Canned and packaged gravies. Fish sauce. Oyster sauce. Cocktail sauce. Horseradish that you find on the shelf. Ketchup. Mustard. Meat flavorings and tenderizers. Bouillon cubes. Hot sauce and Tabasco sauce. Premade or packaged marinades. Premade or packaged taco seasonings. Relishes. Regular salad dressings. Where to find more information:  National Heart, Lung, and Blood Institute: www.nhlbi.nih.gov  American Heart Association: www.heart.org Summary  The DASH eating plan is a healthy eating plan that has been shown to reduce high blood pressure (hypertension). It may also reduce your risk for type 2 diabetes, heart disease, and stroke.  With the DASH eating plan, you should limit salt (sodium) intake to 2,300 mg a day. If you have hypertension, you may need to reduce your sodium intake to 1,500 mg a day.  When on the DASH eating plan, aim to eat more fresh fruits and vegetables, whole grains, lean proteins, low-fat dairy, and heart-healthy fats.  Work with your health care provider or diet and nutrition specialist (dietitian) to adjust your eating plan to your   individual calorie needs. This information is not intended to replace advice given to you by your health care provider. Make sure you discuss any questions you have with your health care provider. Document Released: 06/22/2011 Document Revised: 06/26/2016 Document Reviewed: 06/26/2016 Elsevier Interactive Patient Education  2019 [place].  Rheumatoid Arthritis Rheumatoid arthritis (RA) is a long-term (chronic) disease. RA causes  inflammation in your joints. Your joints may feel painful, stiff, swollen, and warm. RA may start slowly. It most often affects the small joints of the hands and feet. It can also affect other parts of the body. Symptoms of RA often come and go. There is no cure for RA, but medicines can help your symptoms. What are the causes?  RA is an autoimmune disease. This means that your body's defense system (immune system) attacks healthy parts of your body by mistake. The exact cause of RA is not known. What increases the risk?  Being a woman.  Having a family history of RA or other diseases like RA.  Smoking.  Being overweight.  Being exposed to pollutants or chemicals. What are the signs or symptoms?  Morning stiffness that lasts longer than 30 minutes. This is often the first symptom.  Symptoms start slowly. They are often worse in the morning.  As RA gets worse, symptoms may include: ? Pain, stiffness, swelling, warmth, and tenderness in joints on both sides of your body. ? Loss of energy. ? Not feeling hungry. ? Weight loss. ? A low fever. ? Dry eyes and a dry mouth. ? Firm lumps that grow under your skin. ? Changes in the way your joints look. ? Changes in the way your joints work.  Symptoms vary and they: ? Often come and go. ? Sometimes get worse for a period of time. These are called flares. How is this treated?   Treatment may include: ? Taking good care of yourself. Be sure to rest as needed, eat a healthy diet, and exercise. ? Medicines. These may include:  Pain relievers.  Medicines to help with inflammation.  Disease-modifying antirheumatic drugs (DMARDs).  Medicines called biologic response modifiers. ? Physical therapy and occupational therapy. ? Surgery, if joint damage is very bad. Your doctor will work with you to find the best treatments. Follow these instructions at home: Activity  Return to your normal activities as told by your doctor. Ask your  doctor what activities are safe for you.  Rest when you have a flare.  Exercise as told by your doctor. General instructions  Take over-the-counter and prescription medicines only as told by your doctor.  Keep all follow-up visits as told by your doctor. This is important. Where to find more information  [employer]: www.rheumatology.[place]: www.arthritis.org Contact a doctor if:  You have a flare.  You have a fever.  You have problems because of your medicines. Get help right away if:  You have chest pain.  You have trouble breathing.  You get a hot, painful joint all of a sudden, and it is worse than your normal joint aches. Summary  RA is a long-term disease.  Symptoms of RA start slowly. They are often worse in the morning.  RA causes inflammation in your joints. This information is not intended to replace advice given to you by your health care provider. Make sure you discuss any questions you have with your health care provider. Document Released: 09/25/2011 Document Revised: 03/06/2018 Document Reviewed: 03/06/2018 Elsevier Interactive Patient Education  2019 [employer]  Inc.  

## 2018-09-13 LAB — COMPREHENSIVE METABOLIC PANEL
ALT: 15 IU/L (ref 0–32)
AST: 16 IU/L (ref 0–40)
Albumin/Globulin Ratio: 1 — ABNORMAL LOW (ref 1.2–2.2)
Albumin: 3.8 g/dL (ref 3.8–4.9)
Alkaline Phosphatase: 101 IU/L (ref 39–117)
BUN/Creatinine Ratio: 12 (ref 9–23)
BUN: 11 mg/dL (ref 6–24)
Bilirubin Total: 0.3 mg/dL (ref 0.0–1.2)
CO2: 27 mmol/L (ref 20–29)
Calcium: 9.4 mg/dL (ref 8.7–10.2)
Chloride: 103 mmol/L (ref 96–106)
Creatinine, Ser: 0.92 mg/dL (ref 0.57–1.00)
GFR calc Af Amer: 80 mL/min/{1.73_m2} (ref >59)
GFR calc non Af Amer: 69 mL/min/{1.73_m2} (ref >59)
Globulin, Total: 3.8 g/dL (ref 1.5–4.5)
Glucose: 87 mg/dL (ref 65–99)
Potassium: 4.1 mmol/L (ref 3.5–5.2)
Sodium: 143 mmol/L (ref 134–144)
Total Protein: 7.6 g/dL (ref 6.0–8.5)

## 2018-09-14 LAB — ANA,IFA RA DIAG PNL W/RFLX TIT/PATN
ANA Titer 1: NEGATIVE
Cyclic Citrullin Peptide Ab: 168 units — ABNORMAL HIGH (ref 0–19)
Rhuematoid fact SerPl-aCnc: 57.4 IU/mL — ABNORMAL HIGH (ref 0.0–13.9)

## 2018-09-18 ENCOUNTER — Telehealth: Payer: Self-pay

## 2018-09-18 NOTE — Telephone Encounter (Signed)
Please advise on below  

## 2018-09-19 NOTE — Telephone Encounter (Signed)
Patient notified of results.

## 2018-09-26 MED FILL — SPIRONOLACTONE 25 MG TABLET: 25 | 30 days supply | Qty: 30 | Fill #1

## 2018-09-26 MED FILL — ?ATORVASTATIN 40MG TABLET: 40 | 30 days supply | Qty: 30 | Fill #1

## 2018-10-03 MED FILL — CARVEDILOL 6.25 MG TABLET: 6.25 | 30 days supply | Qty: 60 | Fill #1

## 2018-10-17 MED FILL — metFORMIN HCL 500 MG TABS: 500 | 30 days supply | Qty: 30 | Fill #1

## 2018-10-17 MED FILL — AMLODIPINE BESYLATE 5 MG TA: 5 | 30 days supply | Qty: 30 | Fill #1

## 2018-10-24 MED FILL — ?ATORVASTATIN 40MG TABLET: 40 | 30 days supply | Qty: 30 | Fill #2

## 2018-10-31 MED FILL — ?SPIRONOLACTONE 25MG TABS: 25 | 30 days supply | Qty: 30 | Fill #2

## 2018-10-31 MED FILL — ?CARVEDILOL 6.25 MG TABLET: 6.25 | 30 days supply | Qty: 60 | Fill #2

## 2018-11-18 MED FILL — ?AMLODIPINE BESYLATE 5MG TA: 5 | 30 days supply | Qty: 30 | Fill #2

## 2018-11-18 MED FILL — ?METFORMIN HCL 500MG TABLET: 500 | 30 days supply | Qty: 30 | Fill #2

## 2018-11-28 MED FILL — ?ATORVASTATIN 40MG TABLET: 40 | 30 days supply | Qty: 30 | Fill #3

## 2018-11-29 MED FILL — ?SPIRONOLACTONE 25MG TABS: 25 | 30 days supply | Qty: 30 | Fill #3

## 2018-12-03 MED FILL — ?CARVEDILOL 6.25 MG TABLET: 6.25 | 90 days supply | Qty: 180 | Fill #3

## 2018-12-10 ENCOUNTER — Telehealth: Payer: Self-pay

## 2018-12-10 NOTE — Telephone Encounter (Signed)
Called to do COVID -19 Screening. Patient preferred to have visit via telephone. Thanks!

## 2018-12-11 ENCOUNTER — Other Ambulatory Visit: Payer: Self-pay

## 2018-12-11 ENCOUNTER — Ambulatory Visit: Payer: Medicaid Other | Admitting: Family Medicine

## 2018-12-11 ENCOUNTER — Telehealth: Payer: Self-pay | Admitting: Family Medicine

## 2018-12-11 NOTE — Telephone Encounter (Signed)
Called patient for telephonic virtual visit. No answer. No VM.

## 2018-12-26 MED FILL — ?AMLODIPINE BESYLATE 5MG TA: 5 | 30 days supply | Qty: 30 | Fill #3

## 2018-12-26 MED FILL — ?METFORMIN HCL 500MG TABLET: 500 | 30 days supply | Qty: 30 | Fill #3

## 2019-01-02 ENCOUNTER — Other Ambulatory Visit: Payer: Self-pay

## 2019-01-02 DIAGNOSIS — E7849 Other hyperlipidemia: Secondary | ICD-10-CM

## 2019-01-02 MED ORDER — ATORVASTATIN CALCIUM 40 MG PO TABS: 40.0000 mg | ORAL_TABLET | Freq: Every day | ORAL | 3 refills | Status: DC

## 2019-01-02 MED FILL — ?ATORVASTATIN 40MG TABLET: 40 | 30 days supply | Qty: 30 | Fill #0

## 2019-01-02 MED FILL — ?SPIRONOLACTONE 25MG TABS: 25 | 30 days supply | Qty: 30 | Fill #4

## 2019-01-31 MED FILL — ?METFORMIN HCL 500MG TABLET: 500 | 30 days supply | Qty: 30 | Fill #4

## 2019-01-31 MED FILL — ?AMLODIPINE BESYLATE 5MG TA: 5 | 30 days supply | Qty: 30 | Fill #4

## 2019-02-04 ENCOUNTER — Emergency Department (HOSPITAL_COMMUNITY)
Admission: EM | Admit: 2019-02-04 | Discharge: 2019-02-05 | Disposition: A | Discharge status: Home / Self Care | Payer: Medicare Other | Attending: Emergency Medicine | Admitting: Emergency Medicine

## 2019-02-04 ENCOUNTER — Other Ambulatory Visit: Payer: Self-pay

## 2019-02-04 ENCOUNTER — Encounter (HOSPITAL_COMMUNITY): Payer: Self-pay

## 2019-02-04 DIAGNOSIS — I2510 Atherosclerotic heart disease of native coronary artery without angina pectoris: Secondary | ICD-10-CM | POA: Diagnosis not present

## 2019-02-04 DIAGNOSIS — K649 Unspecified hemorrhoids: Secondary | ICD-10-CM | POA: Insufficient documentation

## 2019-02-04 DIAGNOSIS — Z7982 Long term (current) use of aspirin: Secondary | ICD-10-CM | POA: Insufficient documentation

## 2019-02-04 DIAGNOSIS — I110 Hypertensive heart disease with heart failure: Secondary | ICD-10-CM | POA: Diagnosis not present

## 2019-02-04 DIAGNOSIS — Z7984 Long term (current) use of oral hypoglycemic drugs: Secondary | ICD-10-CM | POA: Insufficient documentation

## 2019-02-04 DIAGNOSIS — K625 Hemorrhage of anus and rectum: Secondary | ICD-10-CM

## 2019-02-04 DIAGNOSIS — Z79899 Other long term (current) drug therapy: Secondary | ICD-10-CM | POA: Insufficient documentation

## 2019-02-04 DIAGNOSIS — R1032 Left lower quadrant pain: Secondary | ICD-10-CM | POA: Diagnosis present

## 2019-02-04 DIAGNOSIS — I509 Heart failure, unspecified: Secondary | ICD-10-CM | POA: Diagnosis not present

## 2019-02-04 NOTE — ED Triage Notes (Signed)
Pt reports constipation over the weekend, pt had BM on Saturday but has been bleeding per rectum ever since. Pt states she thinks she tore something in her rectum. Pain in the area.

## 2019-02-05 ENCOUNTER — Emergency Department (HOSPITAL_COMMUNITY): Payer: Medicare Other

## 2019-02-05 ENCOUNTER — Encounter (HOSPITAL_COMMUNITY): Payer: Self-pay | Admitting: Student

## 2019-02-05 DIAGNOSIS — K649 Unspecified hemorrhoids: Secondary | ICD-10-CM | POA: Diagnosis not present

## 2019-02-05 LAB — CBC WITH DIFFERENTIAL/PLATELET
Abs Immature Granulocytes: 0.02 10*3/uL (ref 0.00–0.07)
Basophils Absolute: 0 10*3/uL (ref 0.0–0.1)
Basophils Relative: 0 %
Eosinophils Absolute: 0.2 10*3/uL (ref 0.0–0.5)
Eosinophils Relative: 4 %
HCT: 36.3 % (ref 36.0–46.0)
Hemoglobin: 11.6 g/dL — ABNORMAL LOW (ref 12.0–15.0)
Immature Granulocytes: 0 %
Lymphocytes Relative: 28 %
Lymphs Abs: 1.7 10*3/uL (ref 0.7–4.0)
MCH: 25.1 pg — ABNORMAL LOW (ref 26.0–34.0)
MCHC: 32 g/dL (ref 30.0–36.0)
MCV: 78.4 fL — ABNORMAL LOW (ref 80.0–100.0)
Monocytes Absolute: 0.6 10*3/uL (ref 0.1–1.0)
Monocytes Relative: 10 %
Neutro Abs: 3.3 10*3/uL (ref 1.7–7.7)
Neutrophils Relative %: 58 %
Platelets: 303 10*3/uL (ref 150–400)
RBC: 4.63 MIL/uL (ref 3.87–5.11)
RDW: 14.9 % (ref 11.5–15.5)
WBC: 5.8 10*3/uL (ref 4.0–10.5)
nRBC: 0 % (ref 0.0–0.2)

## 2019-02-05 LAB — COMPREHENSIVE METABOLIC PANEL
ALT: 13 U/L (ref 0–44)
AST: 17 U/L (ref 15–41)
Albumin: 3.3 g/dL — ABNORMAL LOW (ref 3.5–5.0)
Alkaline Phosphatase: 93 U/L (ref 38–126)
Anion gap: 7 (ref 5–15)
BUN: 13 mg/dL (ref 6–20)
CO2: 29 mmol/L (ref 22–32)
Calcium: 9.5 mg/dL (ref 8.9–10.3)
Chloride: 103 mmol/L (ref 98–111)
Creatinine, Ser: 0.98 mg/dL (ref 0.44–1.00)
GFR calc Af Amer: >60 mL/min (ref >60)
GFR calc non Af Amer: >60 mL/min (ref >60)
Glucose, Bld: 119 mg/dL — ABNORMAL HIGH (ref 70–99)
Potassium: 3.6 mmol/L (ref 3.5–5.1)
Sodium: 139 mmol/L (ref 135–145)
Total Bilirubin: 0.2 mg/dL — ABNORMAL LOW (ref 0.3–1.2)
Total Protein: 7.5 g/dL (ref 6.5–8.1)

## 2019-02-05 LAB — URINALYSIS, ROUTINE W REFLEX MICROSCOPIC
Bilirubin Urine: NEGATIVE
Glucose, UA: NEGATIVE mg/dL
Hgb urine dipstick: NEGATIVE
Ketones, ur: NEGATIVE mg/dL
Leukocytes,Ua: NEGATIVE
Nitrite: NEGATIVE
Protein, ur: NEGATIVE mg/dL
Specific Gravity, Urine: 1.031 — ABNORMAL HIGH (ref 1.005–1.030)
pH: 5 (ref 5.0–8.0)

## 2019-02-05 LAB — LIPASE, BLOOD: Lipase: 33 U/L (ref 11–51)

## 2019-02-05 LAB — POC OCCULT BLOOD, ED: Fecal Occult Bld: POSITIVE — AB

## 2019-02-05 MED ORDER — HYDROCORTISONE ACETATE 25 MG RE SUPP: 25.0000 mg | Freq: Two times a day (BID) | RECTAL | 0 refills | Status: DC | PRN

## 2019-02-05 MED ORDER — POLYETHYLENE GLYCOL 3350 17 G PO PACK: 17.0000 g | PACK | Freq: Every day | ORAL | 0 refills | Status: DC | PRN

## 2019-02-05 MED ORDER — IOHEXOL 300 MG/ML  SOLN
100.0000 mL | Freq: Once | INTRAMUSCULAR | Status: AC | PRN
  Administered 2019-02-05: 100 mL via INTRAVENOUS

## 2019-02-05 MED ORDER — FENTANYL CITRATE (PF) 100 MCG/2ML IJ SOLN
50.0000 ug | Freq: Once | INTRAMUSCULAR | Status: AC
  Administered 2019-02-05: 50 ug via INTRAVENOUS
  Filled 2019-02-05: qty 2

## 2019-02-05 NOTE — ED Notes (Signed)
Patient transported to CT scan . 

## 2019-02-05 NOTE — ED Provider Notes (Signed)
[facility] EMERGENCY DEPARTMENT Provider Note   CSN: [id number] Arrival date & time: 02/04/19  2156     History   Chief Complaint Chief Complaint  Patient presents with   Rectal Bleeding   Rectal Pain    HPI Hannah Huerta is a 58 y.o. female with a hx of CHF, GERD, HTN, hyperlipidemia, prediabetes, OSA, & anemia who presents to the ED w/ complaints of abdominal discomfort x 1 week & rectal bleeding x 3 days. Patient states she has had mild aching pain to the LLQ of there abdomen without alleviating/aggravating factors. She states she had been having some issues with constipation, had a hard BM a few days ago during which she had to strain & manually disimpact herself. She states since then she is having fairly normal BMs, but since the difficult one she has had rectal pain & bleeding. Blood on toilet paper & in toilet bowl, not mixed into stool- no melena. Denies fever, chills, nausea, vomiting, dysuria, vaginal discharge, or vaginal bleeding. Denies chest pain, dyspnea, lightheadedness, dizziness, or syncope.     HPI  Past Medical History:  Diagnosis Date   Abnormal Pap smear of cervix    pt couldnt state what type of abnormality   Anemia    CAD in native artery 06/05/2015   Chest pain    a. 02/2012 abnl myoview - inferoapical reverisbility concerning for ischemia, EF 59%;   02/2012 nonobstructive cath   CHF (congestive heart failure) (HCC)    Chronic migraine    GERD (gastroesophageal reflux disease)    HLD (hyperlipidemia)    Hypertension    Hypokalemia 93/26/7124   Metabolic syndrome 58/03/9832   Morbid obesity with BMI of 50.0-59.9, adult ([place])    Prediabetes    S/P cardiac catheterization, non obstructive disease 06/04/15 06/05/2015   Sickle cell trait ([place])    Syncope     Patient Active Problem List   Diagnosis Date Noted   CHF (congestive heart failure) ([place]) 01/30/2018   OSA (obstructive sleep apnea) 03/01/2017   ARF  (acute renal failure) ([place]) 02/28/2017   Hyponatremia 02/28/2017   Syncope 07/23/2016   Syncope and collapse 07/23/2016   S/P cardiac catheterization, non obstructive disease 06/04/15 06/05/2015   CAD in native artery 06/05/2015   Fever 06/05/2015   Hypokalemia 82/50/5397   Metabolic syndrome 67/34/1937   Chest pain, neg MI, possible GI 06/04/2015   Colon cancer screening 08/05/2014   Severe obesity (BMI >= 40) ([place]) 08/05/2014   Health care maintenance 05/13/2014   Prediabetes 05/13/2014   Chronic migraine 04/27/2014   Atypical chest pain 04/27/2014   SOB (shortness of breath) 09/16/2013   Palpitations 09/16/2013   HTN (hypertension) 03/09/2012   Hyperlipidemia 03/09/2012   Microcytic anemia    Morbid obesity with BMI of 50.0-59.9, adult ([place])    Precordial chest pain 03/06/2012   Dizziness 03/06/2012    Past Surgical History:  Procedure Laterality Date   BACK SURGERY     x1 lumbar fusion   CARDIAC CATHETERIZATION N/A 06/04/2015   Procedure: Left Heart Cath and Coronary Angiography;  Surgeon: [name], MD;  Location: [name];  Service: Cardiovascular;  Laterality: N/A;   CHOLECYSTECTOMY     laparoscopic   COLONOSCOPY WITH PROPOFOL N/A 09/04/2014   Procedure: COLONOSCOPY WITH PROPOFOL;  Surgeon: [name], MD;  Location: [facility];  Service: Endoscopy;  Laterality: N/A;   LEFT HEART CATHETERIZATION WITH CORONARY ANGIOGRAM N/A 03/08/2012   Procedure: LEFT HEART  CATHETERIZATION WITH CORONARY ANGIOGRAM;  Surgeon: Hillary Bow, MD;  Location: [facility] CATH LAB;  Service: Cardiovascular;  Laterality: N/A;   SPINE SURGERY     fusion   TUBAL LIGATION       OB History    Gravida  5   Para  3   Term      Preterm      AB  2   Living  3     SAB      TAB  2   Ectopic      Multiple      Live Births               Home Medications    Prior to Admission medications   Medication Sig Start Date End Date  Taking? Authorizing Provider  amLODipine (NORVASC) 5 MG tablet Take 1 tablet (5 mg total) by mouth daily. 09/12/18   Lanae Boast, FNP  aspirin EC 81 MG tablet Take 1 tablet (81 mg total) by mouth daily. 03/07/17   Dorena Dew, FNP  atorvastatin (LIPITOR) 40 MG tablet Take 1 tablet (40 mg total) by mouth daily at 6 PM. 01/02/19   Lanae Boast, FNP  carvedilol (COREG) 6.25 MG tablet TAKE 1 TABLET BY MOUTH 2 TIMES DAILY WITH A MEAL. 07/22/18   Lanae Boast, FNP  metFORMIN (GLUCOPHAGE) 500 MG tablet Take 1 tablet (500 mg total) by mouth daily with breakfast. 09/12/18   Lanae Boast, FNP  spironolactone (ALDACTONE) 25 MG tablet TAKE 1 TABLET (25 MG TOTAL) BY MOUTH DAILY. 08/30/18 09/29/18  Lanae Boast, FNP    Family History Family History  Problem Relation Age of Onset   Coronary artery disease Father 73   Heart disease Father    Other Father        died in a MVA   Hypertension Father    Sudden death Mother 33       Questionable MI   Diabetes Mother    Hypertension Mother    Hypertension Sister    Pancreatic cancer Maternal Aunt    Breast cancer Maternal Aunt        after age 63   Stroke Paternal Grandmother    Colon polyps Sister    Colon cancer Neg Hx    Gallbladder disease Neg Hx    Esophageal cancer Neg Hx     Social History Social History   Tobacco Use   Smoking status: Never Smoker   Smokeless tobacco: Never Used  Substance Use Topics   Alcohol use: No   Drug use: No     Allergies   Patient has no known allergies.   Review of Systems Review of Systems  Constitutional: Negative for chills and fever.  Respiratory: Negative for shortness of breath.   Cardiovascular: Negative for chest pain.  Gastrointestinal: Positive for abdominal pain, anal bleeding, constipation (resolved at present) and rectal pain. Negative for diarrhea, nausea and vomiting.  Genitourinary: Negative for dysuria, vaginal bleeding and vaginal discharge.    Neurological: Negative for dizziness, tremors, syncope and light-headedness.  All other systems reviewed and are negative.    Physical Exam Updated Vital Signs BP 127/75 (BP Location: Right Arm)    Pulse 69    Temp 97.9 F (36.6 C) (Oral)    Resp 19    LMP 09/15/2013    SpO2 100%   Physical Exam Vitals signs and nursing note reviewed. Exam conducted with a chaperone present.  Constitutional:      General: She  is not in acute distress.    Appearance: She is well-developed. She is not toxic-appearing.  HENT:     Head: Normocephalic and atraumatic.  Eyes:     General:        Right eye: No discharge.        Left eye: No discharge.     Conjunctiva/sclera: Conjunctivae normal.  Neck:     Musculoskeletal: Neck supple.  Cardiovascular:     Rate and Rhythm: Normal rate and regular rhythm.  Pulmonary:     Effort: Pulmonary effort is normal. No respiratory distress.     Breath sounds: Normal breath sounds. No wheezing, rhonchi or rales.  Abdominal:     General: There is no distension.     Palpations: Abdomen is soft.     Tenderness: There is abdominal tenderness (mild LLQ). There is no right CVA tenderness, left CVA tenderness, guarding or rebound.  Genitourinary:    Comments: EDT present as chaperone.  Patient has several non-thrombosed external hemorrhoids- one with small amount of blood noted over it. DRE w/ soft brown stool and small area of bright red blood to same aspect of digit as the bleeding hemorrhoid is. No melena. NO hematochezia. No obvious anal fissure.  Skin:    General: Skin is warm and dry.     Findings: No rash.  Neurological:     Mental Status: She is alert.     Comments: Clear speech.   Psychiatric:        Behavior: Behavior normal.    ED Treatments / Results  Labs (all labs ordered are listed, but only abnormal results are displayed) Labs Reviewed  COMPREHENSIVE METABOLIC PANEL - Abnormal; Notable for the following components:      Result Value    Glucose, Bld 119 (*)    Albumin 3.3 (*)    Total Bilirubin 0.2 (*)    All other components within normal limits  CBC WITH DIFFERENTIAL/PLATELET - Abnormal; Notable for the following components:   Hemoglobin 11.6 (*)    MCV 78.4 (*)    MCH 25.1 (*)    All other components within normal limits  URINALYSIS, ROUTINE W REFLEX MICROSCOPIC - Abnormal; Notable for the following components:   Specific Gravity, Urine 1.031 (*)    All other components within normal limits  POC OCCULT BLOOD, ED - Abnormal; Notable for the following components:   Fecal Occult Bld POSITIVE (*)    All other components within normal limits  LIPASE, BLOOD    EKG None  Radiology Ct Abdomen Pelvis W Contrast  Result Date: 02/05/2019 CLINICAL DATA:  Constipation and rectal bleeding. EXAM: CT ABDOMEN AND PELVIS WITH CONTRAST TECHNIQUE: Multidetector CT imaging of the abdomen and pelvis was performed using the standard protocol following bolus administration of intravenous contrast. CONTRAST:  129mL OMNIPAQUE IOHEXOL 300 MG/ML  SOLN COMPARISON:  CT 02/27/2017 FINDINGS: Lower chest: The lung bases are clear. Hepatobiliary: No focal hepatic abnormality. Improved hepatic steatosis from prior exam. Clips in the gallbladder fossa postcholecystectomy. No biliary dilatation. Pancreas: No ductal dilatation or inflammation. Spleen: Normal in size without focal abnormality. Adrenals/Urinary Tract: No adrenal nodule. No hydronephrosis or perinephric edema. Bilateral renal cortical scarring. Small cyst in the lower right kidney. Urinary bladder is physiologically distended. No bladder wall thickening. Stomach/Bowel: Stomach partially distended. No small bowel obstruction, wall thickening, or inflammatory change. Normal appendix. Small volume of colonic stool. Mild colonic diverticulosis without diverticulitis. No colonic wall thickening or inflammatory change. No rectal wall thickening or perirectal fluid  collection. Vascular/Lymphatic:  Normal caliber abdominal aorta. Portal vein and mesenteric vessels are patent. No adenopathy. Reproductive: Fibroid uterus. Occludes an exophytic 3 cm left fundal fibroid. No adnexal mass. Other: No free air, free fluid, or intra-abdominal fluid collection. Tiny fat containing umbilical hernia. Musculoskeletal: Postsurgical change in the lower lumbar spine. Multilevel vacuum phenomena and degenerative change in the spine. There are no acute or suspicious osseous abnormalities. IMPRESSION: 1. No acute abnormality or explanation for abdominal pain. 2. Mild colonic diverticulosis without diverticulitis. 3. Fibroid uterus. Electronically Signed   By: Keith Rake M.D.   On: 02/05/2019 03:16    Procedures Procedures (including critical care time)  Medications Ordered in ED Medications  fentaNYL (SUBLIMAZE) injection 50 mcg (50 mcg Intravenous Given 02/05/19 0147)  iohexol (OMNIPAQUE) 300 MG/ML solution 100 mL (100 mLs Intravenous Contrast Given 02/05/19 0254)     Initial Impression / Assessment and Plan / ED Course  I have reviewed the triage vital signs and the nursing notes.  Pertinent labs & imaging results that were available during my care of the patient were reviewed by me and considered in my medical decision making (see chart for details).    Patient presents to the ED w/ complaints of mild LLQ abdominal pain, constipation, & rectal pain/bleeding after a strained BM. Nontoxic appearing, resting comfortably, vitals without significant abnormality. Exam w/ mild LLQ abdominal tenderness- no peritoneal signs. Rectal exam with several external hemorrhoids, non-thrombosed, one of which has some bright red blood over it, DRE w/ soft brown stool some blood to aspect of digit adjacent to hemorrhoid- suspect this as source of blood as well as source of fecal occult positive stool. Will obtain labs & CT abdomen/pelvis for further assessment.   CBC: No leukocytosis. Hgb/hct stable, slightly improved  from prior.  CMP: Mild hyperglycemia, no significant electrolyte derangement. LFTs & renal function preserved.  Lipase: WNL UA: No UTI CT abdomen/pelvis: 1. No acute abnormality or explanation for abdominal pain. 2. Mild colonic diverticulosis without diverticulitis. 3. Fibroid uterus.    Repeat abdominal exam remains w/o peritoneal signs- doubt perf, obstruction, diverticulitis, pancreatitis, appendicitis, or cholecystitis. Her hgb/hct are stable after a few days of reported bleeding, bright red blood on palpation of one of her hemorrhoids with this aspect of digit having blood on DRE otherwise w/ soft brown stool- suspect hemorrhoid as source of bleeding likely related to constipation & straining also likely cause of LLQ discomfort- feel that  diverticular bleed or significant upper GI Bleed are each less likely, BUN also WNL which is reassuring regarding upper source. Will discharge home with hydrocortisone suppositories w/ instructions for sitz baths. Will also provide miralax to assist w/ constipation as needed & diet recommendations. Close PCP follow up, strict return precautions. I discussed results, treatment plan, need for follow-up, and return precautions with the patient. Provided opportunity for questions, patient confirmed understanding and is in agreement with plan.   Findings and plan of care discussed with supervising physician Dr. Christy Gentles who has evaluated patient & is in agreement.    Final Clinical Impressions(s) / ED Diagnoses   Final diagnoses:  Rectal bleeding  Hemorrhoids, unspecified hemorrhoid type    ED Discharge Orders         Ordered    hydrocortisone (ANUSOL-HC) 25 MG suppository  2 times daily PRN     02/05/19 0502    polyethylene glycol (MIRALAX) 17 g packet  Daily PRN     02/05/19 0503  Leafy Kindle 02/05/19 0515    Ripley Fraise, MD 02/05/19 [phone]

## 2019-02-05 NOTE — Discharge Instructions (Signed)
You were seen in the emergency department today for rectal pain and bleeding.  Your work-up in the emergency department was overall reassuring.  Your hemoglobin and hematocrit were similar to prior labs you have had done indicating you have not had a significant amount of blood loss which is a good thing.  Your CT scan did not show any acute abnormalities, it did show diverticulosis without signs of infection.  It also showed some fibroids in your uterus.  We suspect that your bleeding is coming from your hemorrhoids, we are sending you home with a hydrocortisone suppository to place rectally twice per day as needed for rectal pain/bleeding/itching.  Please use this as prescribed.  Sending home with MiraLAX to take as needed for constipation as constipation can further irritate your hemorrhoids.  We have prescribed you new medication(s) today. Discuss the medications prescribed today with your pharmacist as they can have adverse effects and interactions with your other medicines including over the counter and prescribed medications. Seek medical evaluation if you start to experience new or abnormal symptoms after taking one of these medicines, seek care immediately if you start to experience difficulty breathing, feeling of your throat closing, facial swelling, or rash as these could be indications of a more serious allergic reaction  Please utilize sitz bath as instructed.  Please follow attached diet guidelines.  We would like you to follow-up very closely with your primary care provider within 1 to 2 days for reevaluation.  Return to the ER for new or worsening symptoms including but not limited to worsening bleeding, worsening pain, dark or tarry stools, inability to keep fluids down, fever, lightheadedness, passing out, chest pain, trouble breathing, or any other concerns.

## 2019-02-05 NOTE — ED Provider Notes (Signed)
Patient seen/examined in the Emergency Department in conjunction with Advanced Practice Provider  Patient reports constipation, abdominal pain and rectal bleeding Exam : awake/alert, no distress.  Mild abdominal tenderness. Vital appropriate Plan: CT abd/pelvis pending at this time     Ripley Fraise, MD 02/05/19 [phone]

## 2019-02-07 MED FILL — ?SPIRONOLACTONE 25MG TABS: 25 | 30 days supply | Qty: 30 | Fill #5

## 2019-02-10 MED FILL — ATORVASTATIN CALCIUM 40 MG: 40 | 30 days supply | Qty: 30 | Fill #1

## 2019-03-03 MED FILL — ?AMLODIPINE BESYLATE 5MG TA: 5 | 30 days supply | Qty: 30 | Fill #5

## 2019-03-03 MED FILL — ?METFORMIN HCL 500MG TABLET: 500 | 30 days supply | Qty: 30 | Fill #5

## 2019-03-14 ENCOUNTER — Other Ambulatory Visit: Payer: Self-pay

## 2019-03-14 DIAGNOSIS — I10 Essential (primary) hypertension: Secondary | ICD-10-CM

## 2019-03-14 MED ORDER — CARVEDILOL 6.25 MG PO TABS: ORAL_TABLET | ORAL | 5 refills | Status: DC

## 2019-03-14 MED ORDER — SPIRONOLACTONE 25 MG PO TABS: 25.0000 mg | ORAL_TABLET | Freq: Every day | ORAL | 5 refills | Status: DC

## 2019-03-14 MED FILL — SPIRONOLACTONE 25 MG TABLET: 25 | 30 days supply | Qty: 30 | Fill #0

## 2019-03-14 MED FILL — CARVEDILOL 6.25 MG TABLET: 6.25 | 30 days supply | Qty: 60 | Fill #0

## 2019-03-18 MED FILL — ATORVASTATIN CALCIUM 40 MG: 40 | 30 days supply | Qty: 30 | Fill #2

## 2019-03-26 ENCOUNTER — Encounter (HOSPITAL_COMMUNITY): Payer: Self-pay | Admitting: *Deleted

## 2019-03-26 ENCOUNTER — Encounter (HOSPITAL_COMMUNITY): Payer: Self-pay

## 2019-04-03 ENCOUNTER — Other Ambulatory Visit: Payer: Self-pay | Admitting: Family Medicine

## 2019-04-03 DIAGNOSIS — R7303 Prediabetes: Secondary | ICD-10-CM

## 2019-04-03 MED FILL — ?AMLODIPINE BESYLATE 5MG TA: 5 | 30 days supply | Qty: 30 | Fill #6

## 2019-04-03 MED FILL — ?METFORMIN HCL 500MG TABLET: 500 | 30 days supply | Qty: 30 | Fill #0

## 2019-04-22 MED FILL — SPIRONOLACTONE 25 MG TABLET: 25 | 30 days supply | Qty: 30 | Fill #1

## 2019-04-22 MED FILL — ATORVASTATIN CALCIUM 40 MG: 40 | 30 days supply | Qty: 30 | Fill #3

## 2019-04-23 MED FILL — CARVEDILOL 6.25 MG TABLET: 6.25 | 30 days supply | Qty: 60 | Fill #1

## 2019-05-05 MED FILL — ?METFORMIN HCL 500MG TABLET: 500 | 30 days supply | Qty: 30 | Fill #1

## 2019-05-05 MED FILL — ?AMLODIPINE BESYLATE 5MG TA: 5 | 30 days supply | Qty: 30 | Fill #7

## 2019-05-21 MED FILL — SPIRONOLACTONE 25 MG TABLET: 25 | 30 days supply | Qty: 30 | Fill #2

## 2019-05-29 ENCOUNTER — Other Ambulatory Visit: Payer: Self-pay | Admitting: Family Medicine

## 2019-05-29 DIAGNOSIS — E7849 Other hyperlipidemia: Secondary | ICD-10-CM

## 2019-05-29 MED FILL — ATORVASTATIN CALCIUM 40 MG: 40 | 30 days supply | Qty: 30 | Fill #0

## 2019-05-29 MED FILL — CARVEDILOL 6.25 MG TABLET: 6.25 | 30 days supply | Qty: 60 | Fill #2

## 2019-06-05 MED FILL — ?METFORMIN HCL 500MG TABLET: 500 | 30 days supply | Qty: 30 | Fill #2

## 2019-06-05 MED FILL — ?AMLODIPINE BESYLATE 5 MG T: 5 MG | 30 days supply | Qty: 30 | Fill #8

## 2019-06-19 MED FILL — SPIRONOLACTONE 25 MG TABLET: 25 | 30 days supply | Qty: 30 | Fill #3

## 2019-06-30 MED FILL — CARVEDILOL 6.25 MG TABLET: 6.25 | 30 days supply | Qty: 60 | Fill #3

## 2019-06-30 MED FILL — ATORVASTATIN CALCIUM 40 MG: 40 | 30 days supply | Qty: 30 | Fill #1

## 2019-07-07 MED FILL — AMLODIPINE BESYLATE 5 MG TA: 5 | 30 days supply | Qty: 30 | Fill #9

## 2019-07-07 MED FILL — ?METFORMIN HCL 500MG TABLET: 500 | 30 days supply | Qty: 30 | Fill #3

## 2019-07-21 MED FILL — SPIRONOLACTONE 25 MG TABLET: 25 | 30 days supply | Qty: 30 | Fill #4

## 2019-08-04 MED FILL — ATORVASTATIN CALCIUM 40 MG: 40 | 30 days supply | Qty: 30 | Fill #2

## 2019-08-04 MED FILL — CARVEDILOL 6.25 MG TABLET: 6.25 | 30 days supply | Qty: 60 | Fill #4

## 2019-08-04 MED FILL — metFORMIN HCL 500 MG TABS: 500 | 30 days supply | Qty: 30 | Fill #4

## 2019-08-11 MED FILL — AMLODIPINE BESYLATE 5 MG TA: 5 | 30 days supply | Qty: 30 | Fill #10

## 2019-08-25 MED FILL — SPIRONOLACTONE 25 MG TABLET: 25 | 30 days supply | Qty: 30 | Fill #5

## 2019-09-03 ENCOUNTER — Other Ambulatory Visit: Payer: Self-pay

## 2019-09-03 DIAGNOSIS — I10 Essential (primary) hypertension: Secondary | ICD-10-CM

## 2019-09-03 DIAGNOSIS — E7849 Other hyperlipidemia: Secondary | ICD-10-CM

## 2019-09-03 MED ORDER — ATORVASTATIN CALCIUM 40 MG PO TABS: 40.0000 mg | ORAL_TABLET | Freq: Every day | ORAL | 0 refills | Status: DC

## 2019-09-03 MED ORDER — CARVEDILOL 6.25 MG PO TABS: ORAL_TABLET | ORAL | 0 refills | Status: DC

## 2019-09-03 MED ORDER — SPIRONOLACTONE 25 MG PO TABS: 25.0000 mg | ORAL_TABLET | Freq: Every day | ORAL | 0 refills | Status: DC

## 2019-09-03 MED ORDER — AMLODIPINE BESYLATE 5 MG PO TABS: 5.0000 mg | ORAL_TABLET | Freq: Every day | ORAL | 0 refills | Status: DC

## 2019-09-04 MED FILL — metFORMIN HCL 500 MG TABS: 500 | 30 days supply | Qty: 30 | Fill #5

## 2019-09-08 ENCOUNTER — Encounter: Payer: Self-pay | Admitting: Nurse Practitioner

## 2019-09-08 ENCOUNTER — Other Ambulatory Visit: Payer: Self-pay

## 2019-09-08 ENCOUNTER — Ambulatory Visit (INDEPENDENT_AMBULATORY_CARE_PROVIDER_SITE_OTHER): Payer: Medicare HMO | Admitting: Nurse Practitioner

## 2019-09-08 ENCOUNTER — Other Ambulatory Visit: Payer: Self-pay | Admitting: Nurse Practitioner

## 2019-09-08 VITALS — BP 148/91 | HR 77 | Temp 99.2°F | Resp 16 | Ht 62.0 in | Wt 284.0 lb

## 2019-09-08 DIAGNOSIS — M5431 Sciatica, right side: Secondary | ICD-10-CM

## 2019-09-08 DIAGNOSIS — R7303 Prediabetes: Secondary | ICD-10-CM | POA: Diagnosis not present

## 2019-09-08 DIAGNOSIS — Z0000 Encounter for general adult medical examination without abnormal findings: Secondary | ICD-10-CM | POA: Diagnosis not present

## 2019-09-08 DIAGNOSIS — I10 Essential (primary) hypertension: Secondary | ICD-10-CM | POA: Diagnosis not present

## 2019-09-08 DIAGNOSIS — M25532 Pain in left wrist: Secondary | ICD-10-CM

## 2019-09-08 DIAGNOSIS — E782 Mixed hyperlipidemia: Secondary | ICD-10-CM | POA: Diagnosis not present

## 2019-09-08 LAB — POCT URINALYSIS DIPSTICK
Bilirubin, UA: NEGATIVE
Blood, UA: NEGATIVE
Glucose, UA: NEGATIVE
Ketones, UA: NEGATIVE
Leukocytes, UA: NEGATIVE
Nitrite, UA: NEGATIVE
Protein, UA: POSITIVE — AB
Spec Grav, UA: 1.02 (ref 1.010–1.025)
Urobilinogen, UA: 0.2 E.U./dL
pH, UA: 5.5 (ref 5.0–8.0)

## 2019-09-08 LAB — POCT GLYCOSYLATED HEMOGLOBIN (HGB A1C): Hemoglobin A1C: 6 % — AB (ref 4.0–5.6)

## 2019-09-08 NOTE — Progress Notes (Signed)
Established Patient Office Visit  Subjective:  Patient ID: Hannah Huerta, female    DOB: 06-07-1961  Age: 59 y.o. MRN: [id number]  CC:  Chief Complaint  Patient presents with  . Hypertension  . Follow-up    pre-diabets   . Wrist Pain    left wrist   . Leg Pain    right side hip/leg pain that shoots down leg   . Insomnia    HPI Hannah Huerta presents for follow-up.  has a past medical history of Abnormal Pap smear of cervix, Anemia, CAD in native artery (06/05/2015), Chest pain, CHF (congestive heart failure) ([place]), Chronic migraine, GERD (gastroesophageal reflux disease), HLD (hyperlipidemia), Hypertension, Hypokalemia ([id number]), Metabolic syndrome ([id number]), Morbid obesity with BMI of 50.0-59.9, adult ([place]), Prediabetes, S/P cardiac catheterization, non obstructive disease 06/04/15 (06/05/2015), Sickle cell trait ([place]), and Syncope.  She is a previous patient of Lanae Boast, [facility].    Today she is complaining of left wrist pain.  She admits this has been going on for a while.  She does have some swelling and stiffness.  She admits that she was diagnosed with carpal tunnel 5 years ago however there was no treatment at that time.  She has tried Tylenol which has not been effective.  She is also complaining of pain in her right buttock that goes down the back of her leg.  She denies any numbness or tingling.  She does have weakness.  She describes it just as a pain.  She feels like this has been going on for about 2 months.  She has tried again Tylenol, repositioning and newly tried some yoga.  She admits that she was limited with the yoga.  She is willing to try more.  Denies headache, dizziness, visual changes, shortness of breath, dyspnea on exertion, chest pain, nausea, vomiting or any edema.    Past Medical History:  Diagnosis Date  . Abnormal Pap smear of cervix    pt couldnt state what type of abnormality  . Anemia   . CAD in native artery 06/05/2015  . Chest pain    a. 02/2012 abnl myoview - inferoapical reverisbility concerning for ischemia, EF 59%;   02/2012 nonobstructive cath  . CHF (congestive heart failure) ([place])   . Chronic migraine   . GERD (gastroesophageal reflux disease)   . HLD (hyperlipidemia)   . Hypertension   . Hypokalemia 06/05/2015  . Metabolic syndrome [id number]  . Morbid obesity with BMI of 50.0-59.9, adult ([place])   . Prediabetes   . S/P cardiac catheterization, non obstructive disease 06/04/15 06/05/2015  . Sickle cell trait ([place])   . Syncope     Past Surgical History:  Procedure Laterality Date  . BACK SURGERY     x1 lumbar fusion  . CARDIAC CATHETERIZATION N/A 06/04/2015   Procedure: Left Heart Cath and Coronary Angiography;  Surgeon: [name], MD;  Location: [facility];  Service: Cardiovascular;  Laterality: N/A;  . CHOLECYSTECTOMY     laparoscopic  . COLONOSCOPY WITH PROPOFOL N/A 09/04/2014   Procedure: COLONOSCOPY WITH PROPOFOL;  Surgeon: [name], MD;  Location: [facility];  Service: Endoscopy;  Laterality: N/A;  . LEFT HEART CATHETERIZATION WITH CORONARY ANGIOGRAM N/A 03/08/2012   Procedure: LEFT HEART CATHETERIZATION WITH CORONARY ANGIOGRAM;  Surgeon: Hillary Bow, MD;  Location: [facility] CATH LAB;  Service: Cardiovascular;  Laterality: N/A;  . SPINE SURGERY     fusion  . TUBAL LIGATION      Family History  Problem Relation Age of Onset  . Coronary artery disease Father 7  . Heart disease Father   . Other Father        died in a MVA  . Hypertension Father   . Sudden death Mother 7       Questionable MI  . Diabetes Mother   . Hypertension Mother   . Hypertension Sister   . Pancreatic cancer Maternal Aunt   . Breast cancer Maternal Aunt        after age 39  . Stroke Paternal Grandmother   . Colon polyps Sister   . Colon cancer Neg Hx   . Gallbladder disease Neg Hx   . Esophageal cancer Neg Hx     Social History   Socioeconomic History  . Marital status: Widowed    Spouse name:  Not on file  . Number of children: 3  . Years of education: Not on file  . Highest education level: Not on file  Occupational History  . Occupation: Works in the Estate manager/land agent: [employer] Use  . Smoking status: Never Smoker  . Smokeless tobacco: Never Used  Substance and Sexual Activity  . Alcohol use: No  . Drug use: No  . Sexual activity: Yes    Birth control/protection: Surgical  Other Topics Concern  . Not on file  Social History Narrative   Lives at home with husband and two of her sons.     Social Determinants of Health   Financial Resource Strain:   . Difficulty of Paying Living Expenses: Not on file  Food Insecurity:   . Worried About Charity fundraiser in the Last Year: Not on file  . Ran Out of Food in the Last Year: Not on file  Transportation Needs:   . Lack of Transportation (Medical): Not on file  . Lack of Transportation (Non-Medical): Not on file  Physical Activity:   . Days of Exercise per Week: Not on file  . Minutes of Exercise per Session: Not on file  Stress:   . Feeling of Stress : Not on file  Social Connections:   . Frequency of Communication with Friends and Family: Not on file  . Frequency of Social Gatherings with Friends and Family: Not on file  . Attends Religious Services: Not on file  . Active Member of Clubs or Organizations: Not on file  . Attends Archivist Meetings: Not on file  . Marital Status: Not on file  Intimate Partner Violence:   . Fear of Current or Ex-Partner: Not on file  . Emotionally Abused: Not on file  . Physically Abused: Not on file  . Sexually Abused: Not on file    Outpatient Medications Prior to Visit  Medication Sig Dispense Refill  . amLODipine (NORVASC) 5 MG tablet Take 1 tablet (5 mg total) by mouth daily. 90 tablet 0  . aspirin EC 81 MG tablet Take 1 tablet (81 mg total) by mouth daily. 30 tablet 11  . atorvastatin (LIPITOR) 40 MG tablet Take 1 tablet (40 mg total) by mouth daily at  6 PM. 30 tablet 0  . carvedilol (COREG) 6.25 MG tablet TAKE 1 TABLET BY MOUTH 2 TIMES DAILY WITH A MEAL. 60 tablet 0  . metFORMIN (GLUCOPHAGE) 500 MG tablet TAKE 1 TABLET (500 MG TOTAL) BY MOUTH DAILY WITH BREAKFAST. 30 tablet 5  . spironolactone (ALDACTONE) 25 MG tablet Take 1 tablet (25 mg total) by mouth daily. 30 tablet 0  .  hydrocortisone (ANUSOL-HC) 25 MG suppository Place 1 suppository (25 mg total) rectally 2 (two) times daily as needed for hemorrhoids or anal itching. (Patient not taking: Reported on 09/08/2019) 12 suppository 0  . polyethylene glycol (MIRALAX) 17 g packet Take 17 g by mouth daily as needed for moderate constipation. (Patient not taking: Reported on 09/08/2019) 14 each 0   No facility-administered medications prior to visit.    No Known Allergies  ROS Review of Systems  All other systems reviewed and are negative.     Objective:    Physical Exam  Constitutional: She is oriented to person, place, and time. She appears well-developed.  Obese  HENT:  Head: Normocephalic.  Cardiovascular: Normal rate, regular rhythm, normal heart sounds and intact distal pulses.  Pulmonary/Chest: Effort normal and breath sounds normal.  Musculoskeletal:     Right wrist: Normal.     Left wrist: Swelling and tenderness present.     Cervical back: Normal range of motion and neck supple.       Back:  Neurological: She is alert and oriented to person, place, and time.  Skin: Skin is warm and dry.  Psychiatric: She has a normal mood and affect. Her behavior is normal. Judgment and thought content normal.    BP (!) 148/91 (BP Location: Left Arm, Patient Position: Sitting, Cuff Size: Large)   Pulse 77   Temp 99.2 F (37.3 C) (Oral)   Resp 16   Ht 5\' 2"  (1.575 m)   Wt 284 lb (128.8 kg)   LMP 09/15/2013   SpO2 99%   BMI 51.94 kg/m  Wt Readings from Last 3 Encounters:  09/08/19 284 lb (128.8 kg)  09/12/18 (!) 301 lb (136.5 kg)  06/12/18 294 lb (133.4 kg)     Health  Maintenance Due  Topic Date Due  . MAMMOGRAM  05/19/2016  . PAP SMEAR-Modifier  11/20/2016    There are no preventive care reminders to display for this patient.  Lab Results  Component Value Date   TSH 3.800 02/13/2018   Lab Results  Component Value Date   WBC 5.8 02/05/2019   HGB 11.6 (L) 02/05/2019   HCT 36.3 02/05/2019   MCV 78.4 (L) 02/05/2019   PLT 303 02/05/2019   Lab Results  Component Value Date   NA 139 02/05/2019   K 3.6 02/05/2019   CO2 29 02/05/2019   GLUCOSE 119 (H) 02/05/2019   BUN 13 02/05/2019   CREATININE 0.98 02/05/2019   BILITOT 0.2 (L) 02/05/2019   ALKPHOS 93 02/05/2019   AST 17 02/05/2019   ALT 13 02/05/2019   PROT 7.5 02/05/2019   ALBUMIN 3.3 (L) 02/05/2019   CALCIUM 9.5 02/05/2019   ANIONGAP 7 02/05/2019   Lab Results  Component Value Date   CHOL 137 02/13/2018   Lab Results  Component Value Date   HDL 51 02/13/2018   Lab Results  Component Value Date   LDLCALC 72 02/13/2018   Lab Results  Component Value Date   TRIG 72 02/13/2018   Lab Results  Component Value Date   CHOLHDL 2.7 02/13/2018   Lab Results  Component Value Date   HGBA1C 6.0 (A) 09/08/2019      Assessment & Plan:   Problem List Items Addressed This Visit      Unprioritized   HTN (hypertension) - Primary   Relevant Orders   Urinalysis Dipstick (Completed)   Comp. Metabolic Panel (12)   C-reactive protein   Vitamin B12   Hyperlipidemia   Relevant  Orders   Lipid panel   Prediabetes   Relevant Orders   HgB A1c (Completed)   Urinalysis Dipstick (Completed)   CBC with Differential/Platelet   Comp. Metabolic Panel (12)    Other Visit Diagnoses    Arthralgia of left wrist       Relevant Orders   Sedimentation rate   Uric Acid   DG Wrist Complete Left   Sciatica of right side       Relevant Orders   Uric Acid   DG Lumbar Spine Complete   Healthcare maintenance       Relevant Orders   TSH   VITAMIN D 25 Hydroxy (Vit-D Deficiency, Fractures)    Vitamin B12      No orders of the defined types were placed in this encounter.   Follow-up: Return in about 3 months (around 12/06/2019).    Vevelyn Francois, NP

## 2019-09-08 NOTE — Patient Instructions (Signed)
Sciatica  Sciatica is pain, weakness, tingling, or loss of feeling (numbness) along the sciatic nerve. The sciatic nerve starts in the lower back and goes down the back of each leg. Sciatica usually goes away on its own or with treatment. Sometimes, sciatica may come back (recur). What are the causes? This condition happens when the sciatic nerve is pinched or has pressure put on it. This may be the result of:  A disk in between the bones of the spine bulging out too far (herniated disk).  Changes in the spinal disks that occur with aging.  A condition that affects a muscle in the butt.  Extra bone growth near the sciatic nerve.  A break (fracture) of the area between your hip bones (pelvis).  Pregnancy.  Tumor. This is rare. What increases the risk? You are more likely to develop this condition if you:  Play sports that put pressure or stress on the spine.  Have poor strength and ease of movement (flexibility).  Have had a back injury in the past.  Have had back surgery.  Sit for long periods of time.  Do activities that involve bending or lifting over and over again.  Are very overweight (obese). What are the signs or symptoms? Symptoms can vary from mild to very bad. They may include:  Any of these problems in the lower back, leg, hip, or butt: ? Mild tingling, loss of feeling, or dull aches. ? Burning sensations. ? Sharp pains.  Loss of feeling in the back of the calf or the sole of the foot.  Leg weakness.  Very bad back pain that makes it hard to move. These symptoms may get worse when you cough, sneeze, or laugh. They may also get worse when you sit or stand for long periods of time. How is this treated? This condition often gets better without any treatment. However, treatment may include:  Changing or cutting back on physical activity when you have pain.  Doing exercises and stretching.  Putting ice or heat on the affected area.  Medicines that  help: ? To relieve pain and swelling. ? To relax your muscles.  Shots (injections) of medicines that help to relieve pain, irritation, and swelling.  Surgery. Follow these instructions at home: Medicines  Take over-the-counter and prescription medicines only as told by your doctor.  Ask your doctor if the medicine prescribed to you: ? Requires you to avoid driving or using heavy machinery. ? Can cause trouble pooping (constipation). You may need to take these steps to prevent or treat trouble pooping:  Drink enough fluids to keep your pee (urine) pale yellow.  Take over-the-counter or prescription medicines.  Eat foods that are high in fiber. These include beans, whole grains, and fresh fruits and vegetables.  Limit foods that are high in fat and sugar. These include fried or sweet foods. Managing pain      If told, put ice on the affected area. ? Put ice in a plastic bag. ? Place a towel between your skin and the bag. ? Leave the ice on for 20 minutes, 2-3 times a day.  If told, put heat on the affected area. Use the heat source that your doctor tells you to use, such as a moist heat pack or a heating pad. ? Place a towel between your skin and the heat source. ? Leave the heat on for 20-30 minutes. ? Remove the heat if your skin turns bright red. This is very important if you are   unable to feel pain, heat, or cold. You may have a greater risk of getting burned. Activity   Return to your normal activities as told by your doctor. Ask your doctor what activities are safe for you.  Avoid activities that make your symptoms worse.  Take short rests during the day. ? When you rest for a long time, do some physical activity or stretching between periods of rest. ? Avoid sitting for a long time without moving. Get up and move around at least one time each hour.  Exercise and stretch regularly, as told by your doctor.  Do not lift anything that is heavier than 10 lb (4.5 kg)  while you have symptoms of sciatica. ? Avoid lifting heavy things even when you do not have symptoms. ? Avoid lifting heavy things over and over.  When you lift objects, always lift in a way that is safe for your body. To do this, you should: ? Bend your knees. ? Keep the object close to your body. ? Avoid twisting. General instructions  Stay at a healthy weight.  Wear comfortable shoes that support your feet. Avoid wearing high heels.  Avoid sleeping on a mattress that is too soft or too hard. You might have less pain if you sleep on a mattress that is firm enough to support your back.  Keep all follow-up visits as told by your doctor. This is important. Contact a doctor if:  You have pain that: ? Wakes you up when you are sleeping. ? Gets worse when you lie down. ? Is worse than the pain you have had in the past. ? Lasts longer than 4 weeks.  You lose weight without trying. Get help right away if:  You cannot control when you pee (urinate) or poop (have a bowel movement).  You have weakness in any of these areas and it gets worse: ? Lower back. ? The area between your hip bones. ? Butt. ? Legs.  You have redness or swelling of your back.  You have a burning feeling when you pee. Summary  Sciatica is pain, weakness, tingling, or loss of feeling (numbness) along the sciatic nerve.  This condition happens when the sciatic nerve is pinched or has pressure put on it.  Sciatica can cause pain, tingling, or loss of feeling (numbness) in the lower back, legs, hips, and butt.  Treatment often includes rest, exercise, medicines, and putting ice or heat on the affected area. This information is not intended to replace advice given to you by your health care provider. Make sure you discuss any questions you have with your health care provider. Document Revised: 07/22/2018 Document Reviewed: 07/22/2018 Elsevier Patient Education  2020 [employer]  

## 2019-09-09 LAB — CBC WITH DIFFERENTIAL/PLATELET
Basophils Absolute: 0 10*3/uL (ref 0.0–0.2)
Basos: 1 %
EOS (ABSOLUTE): 0.2 10*3/uL (ref 0.0–0.4)
Eos: 4 %
Hematocrit: 37.6 % (ref 34.0–46.6)
Hemoglobin: 11.8 g/dL (ref 11.1–15.9)
Immature Grans (Abs): 0 10*3/uL (ref 0.0–0.1)
Immature Granulocytes: 1 %
Lymphocytes Absolute: 1.1 10*3/uL (ref 0.7–3.1)
Lymphs: 28 %
MCH: 25.3 pg — ABNORMAL LOW (ref 26.6–33.0)
MCHC: 31.4 g/dL — ABNORMAL LOW (ref 31.5–35.7)
MCV: 81 fL (ref 79–97)
Monocytes Absolute: 0.4 10*3/uL (ref 0.1–0.9)
Monocytes: 10 %
Neutrophils Absolute: 2.2 10*3/uL (ref 1.4–7.0)
Neutrophils: 56 %
Platelets: 276 10*3/uL (ref 150–450)
RBC: 4.67 x10E6/uL (ref 3.77–5.28)
RDW: 14.8 % (ref 11.7–15.4)
WBC: 4 10*3/uL (ref 3.4–10.8)

## 2019-09-09 LAB — COMP. METABOLIC PANEL (12)
AST: 17 IU/L (ref 0–40)
Albumin/Globulin Ratio: 1.2 (ref 1.2–2.2)
Albumin: 4.1 g/dL (ref 3.8–4.9)
Alkaline Phosphatase: 105 IU/L (ref 39–117)
BUN/Creatinine Ratio: 10 (ref 9–23)
BUN: 9 mg/dL (ref 6–24)
Bilirubin Total: 0.3 mg/dL (ref 0.0–1.2)
Calcium: 9.8 mg/dL (ref 8.7–10.2)
Chloride: 100 mmol/L (ref 96–106)
Creatinine, Ser: 0.87 mg/dL (ref 0.57–1.00)
GFR calc Af Amer: 85 mL/min/{1.73_m2} (ref >59)
GFR calc non Af Amer: 74 mL/min/{1.73_m2} (ref >59)
Globulin, Total: 3.3 g/dL (ref 1.5–4.5)
Glucose: 101 mg/dL — ABNORMAL HIGH (ref 65–99)
Potassium: 4 mmol/L (ref 3.5–5.2)
Sodium: 140 mmol/L (ref 134–144)
Total Protein: 7.4 g/dL (ref 6.0–8.5)

## 2019-09-09 LAB — LIPID PANEL
Chol/HDL Ratio: 2.5 ratio (ref 0.0–4.4)
Cholesterol, Total: 131 mg/dL (ref 100–199)
HDL: 52 mg/dL (ref >39)
LDL Chol Calc (NIH): 63 mg/dL (ref 0–99)
Triglycerides: 83 mg/dL (ref 0–149)
VLDL Cholesterol Cal: 16 mg/dL (ref 5–40)

## 2019-09-09 LAB — URIC ACID: Uric Acid: 5 mg/dL (ref 3.0–7.2)

## 2019-09-09 LAB — SEDIMENTATION RATE: Sed Rate: 97 mm/hr — ABNORMAL HIGH (ref 0–40)

## 2019-09-09 LAB — C-REACTIVE PROTEIN: CRP: 6 mg/L (ref 0–10)

## 2019-09-09 LAB — VITAMIN B12: Vitamin B-12: 308 pg/mL (ref 232–1245)

## 2019-09-09 LAB — VITAMIN D 25 HYDROXY (VIT D DEFICIENCY, FRACTURES): Vit D, 25-Hydroxy: 10.6 ng/mL — ABNORMAL LOW (ref 30.0–100.0)

## 2019-09-09 LAB — TSH: TSH: 1.57 u[IU]/mL (ref 0.450–4.500)

## 2019-09-10 ENCOUNTER — Other Ambulatory Visit: Payer: Self-pay | Admitting: Nurse Practitioner

## 2019-09-10 ENCOUNTER — Telehealth: Payer: Self-pay

## 2019-09-10 ENCOUNTER — Other Ambulatory Visit: Payer: Self-pay

## 2019-09-10 DIAGNOSIS — M5431 Sciatica, right side: Secondary | ICD-10-CM

## 2019-09-10 DIAGNOSIS — E559 Vitamin D deficiency, unspecified: Secondary | ICD-10-CM | POA: Insufficient documentation

## 2019-09-10 MED ORDER — ERGOCALCIFEROL 1.25 MG (50000 UT) PO CAPS: 50000.0000 [IU] | ORAL_CAPSULE | ORAL | 0 refills | Status: DC

## 2019-09-10 MED FILL — VIT D2 1.25 MG (50,000 UNIT: 1.25 MG | 84 days supply | Qty: 12 | Fill #0

## 2019-09-10 NOTE — Telephone Encounter (Signed)
-----   Message from Vevelyn Francois, NP sent at 09/10/2019  9:38 AM EST ----- Overall labs are normalVitamin D deficiency.  Vitamin D 50,000 units weekly x12 weeks then recommend daily vitamin D at least 5000 unitsHer sedimentation rate is elevated and it has been in the past however C-reactive protein is normal along with uric acid.Daily exercising will help if she would like a referral to physical therapy for the sciatica please let us know

## 2019-09-10 NOTE — Telephone Encounter (Signed)
Called and spoke with patient, advised that vitamin D was low and to take once weekly vitamin d for 12 weeks. Asked that when she complete this to take otc vitamin D 5000 units daily. Advised that overall other labs are stable and that sed rate was high but had been in past. She is asking to be referred to physical therapy. Provider made aware. Thanks!

## 2019-09-11 ENCOUNTER — Other Ambulatory Visit: Payer: Self-pay

## 2019-09-11 DIAGNOSIS — I10 Essential (primary) hypertension: Secondary | ICD-10-CM

## 2019-09-11 DIAGNOSIS — R7303 Prediabetes: Secondary | ICD-10-CM

## 2019-09-11 MED ORDER — CARVEDILOL 6.25 MG PO TABS: ORAL_TABLET | ORAL | 3 refills | Status: DC

## 2019-09-11 MED ORDER — SPIRONOLACTONE 25 MG PO TABS: 25.0000 mg | ORAL_TABLET | Freq: Every day | ORAL | 3 refills | Status: DC

## 2019-09-11 MED ORDER — METFORMIN HCL 500 MG PO TABS: 500.0000 mg | ORAL_TABLET | Freq: Every day | ORAL | 3 refills | Status: DC

## 2019-09-11 MED ORDER — AMLODIPINE BESYLATE 5 MG PO TABS: 5.0000 mg | ORAL_TABLET | Freq: Every day | ORAL | 3 refills | Status: DC

## 2019-09-17 ENCOUNTER — Other Ambulatory Visit: Payer: Self-pay | Admitting: Nurse Practitioner

## 2019-09-29 ENCOUNTER — Other Ambulatory Visit: Payer: Self-pay

## 2019-09-29 ENCOUNTER — Telehealth: Payer: Self-pay | Admitting: Family Medicine

## 2019-09-29 DIAGNOSIS — E7849 Other hyperlipidemia: Secondary | ICD-10-CM

## 2019-09-29 MED ORDER — ATORVASTATIN CALCIUM 40 MG PO TABS: 40.0000 mg | ORAL_TABLET | Freq: Every day | ORAL | 3 refills | Status: DC

## 2019-09-29 NOTE — Telephone Encounter (Signed)
Referral had went to wrong practice. Changed referral to corrected facility. Made pt aware and provider her with there number to get an appointment. Thanks~ !

## 2019-09-29 NOTE — Telephone Encounter (Signed)
Pt wants to know if referral to physical therapy was sent in. Please call pt back.

## 2019-09-29 NOTE — Telephone Encounter (Signed)
Called and spoke with patient. She is saying outpatient does not have her referral. Called [facility]. No answer left a voicemail to call back. Thanks!

## 2019-09-29 NOTE — Telephone Encounter (Signed)
Pt returned missed call. Please call pt back

## 2019-09-30 NOTE — Telephone Encounter (Signed)
Called and spoke with Hannah Huerta at [facility]. She says they are able to see referral in chart and will contact patient today. Called and make patient aware. Thanks!

## 2019-10-05 DIAGNOSIS — H524 Presbyopia: Secondary | ICD-10-CM | POA: Diagnosis not present

## 2019-10-05 DIAGNOSIS — E119 Type 2 diabetes mellitus without complications: Secondary | ICD-10-CM | POA: Diagnosis not present

## 2019-10-05 DIAGNOSIS — Z0100 Encounter for examination of eyes and vision without abnormal findings: Secondary | ICD-10-CM | POA: Diagnosis not present

## 2019-10-09 ENCOUNTER — Other Ambulatory Visit: Payer: Self-pay

## 2019-10-09 DIAGNOSIS — E7849 Other hyperlipidemia: Secondary | ICD-10-CM

## 2019-10-09 DIAGNOSIS — I10 Essential (primary) hypertension: Secondary | ICD-10-CM

## 2019-10-09 MED ORDER — CARVEDILOL 6.25 MG PO TABS: ORAL_TABLET | ORAL | 3 refills | Status: DC

## 2019-10-09 MED ORDER — SPIRONOLACTONE 25 MG PO TABS: 25.0000 mg | ORAL_TABLET | Freq: Every day | ORAL | 3 refills | Status: DC

## 2019-10-09 MED ORDER — ATORVASTATIN CALCIUM 40 MG PO TABS: 40.0000 mg | ORAL_TABLET | Freq: Every day | ORAL | 3 refills | Status: DC

## 2019-10-16 ENCOUNTER — Encounter: Payer: Self-pay | Admitting: Physical Therapy

## 2019-10-16 ENCOUNTER — Ambulatory Visit: Payer: Medicare HMO | Attending: Nurse Practitioner | Admitting: Physical Therapy

## 2019-10-16 ENCOUNTER — Other Ambulatory Visit: Payer: Self-pay

## 2019-10-16 DIAGNOSIS — M5431 Sciatica, right side: Secondary | ICD-10-CM | POA: Insufficient documentation

## 2019-10-16 DIAGNOSIS — M6281 Muscle weakness (generalized): Secondary | ICD-10-CM

## 2019-10-16 DIAGNOSIS — R2689 Other abnormalities of gait and mobility: Secondary | ICD-10-CM | POA: Diagnosis not present

## 2019-10-16 DIAGNOSIS — M25551 Pain in right hip: Secondary | ICD-10-CM

## 2019-10-16 NOTE — Patient Instructions (Signed)
Access Code: [email or web address] URL: [email or web address] Date: 10/16/2019 Prepared by: Hilda Blades  Exercises Hooklying Single Knee to Chest Stretch - 2 x daily - 7 x weekly - 3 reps - 20 seconds hold Supine Piriformis Stretch with Foot on Ground - 2 x daily - 7 x weekly - 3 reps - 20 seconds hold Clamshell - 2 x daily - 7 x weekly - 2 sets - 15 reps Bridge - 2 x daily - 7 x weekly - 10 reps

## 2019-10-16 NOTE — Therapy (Signed)
[place], [place], [address] Phone: [phone]   Fax:  [phone]  Physical Therapy Evaluation  Patient Details  Name: Hannah Huerta MRN: [id number] Date of Birth: 11-06-1960 Referring Provider (PT): Vevelyn Francois, NP   Encounter Date: 10/16/2019  PT End of Session - 10/16/19 0934    Visit Number  1    Number of Visits  8    Date for PT Re-Evaluation  12/11/19    Authorization Type  [employer] MCR    PT Start Time  0915    PT Stop Time  1000    PT Time Calculation (min)  45 min    Activity Tolerance  Patient tolerated treatment well    Behavior During Therapy  [facility] for tasks assessed/performed       Past Medical History:  Diagnosis Date  . Abnormal Pap smear of cervix    pt couldnt state what type of abnormality  . Anemia   . CAD in native artery 06/05/2015  . Chest pain    a. 02/2012 abnl myoview - inferoapical reverisbility concerning for ischemia, EF 59%;   02/2012 nonobstructive cath  . CHF (congestive heart failure) ([place])   . Chronic migraine   . GERD (gastroesophageal reflux disease)   . HLD (hyperlipidemia)   . Hypertension   . Hypokalemia 06/05/2015  . Metabolic syndrome [id number]  . Morbid obesity with BMI of 50.0-59.9, adult ([place])   . Prediabetes   . S/P cardiac catheterization, non obstructive disease 06/04/15 06/05/2015  . Sickle cell trait ([place])   . Syncope     Past Surgical History:  Procedure Laterality Date  . BACK SURGERY     x1 lumbar fusion  . CARDIAC CATHETERIZATION N/A 06/04/2015   Procedure: Left Heart Cath and Coronary Angiography;  Surgeon: [name], MD;  Location: [facility];  Service: Cardiovascular;  Laterality: N/A;  . CHOLECYSTECTOMY     laparoscopic  . COLONOSCOPY WITH PROPOFOL N/A 09/04/2014   Procedure: COLONOSCOPY WITH PROPOFOL;  Surgeon: [name], MD;  Location: [facility];  Service: Endoscopy;  Laterality: N/A;  . LEFT HEART  CATHETERIZATION WITH CORONARY ANGIOGRAM N/A 03/08/2012   Procedure: LEFT HEART CATHETERIZATION WITH CORONARY ANGIOGRAM;  Surgeon: Hillary Bow, MD;  Location: [facility] CATH LAB;  Service: Cardiovascular;  Laterality: N/A;  . SPINE SURGERY     fusion  . TUBAL LIGATION      There were no vitals filed for this visit.   Subjective Assessment - 10/16/19 0920    Subjective  Patient reports she is having right sciatic nerve pain and sometimes it is hard to walk. This has been going on for about 2 years but did not know what was going on until recently, she cannot remember a mechanism of injury. The pain starts in the right buttock area and shoots down the back of the right leg to the knee, it does not cross the knee. She has tried stretching and doing yoga with a little benefit at first but it has stopped helping. Denies any numbness or tingling.    Pertinent History  Patient reports lumbar spinal fusion surgery in 2009,    Limitations  Sitting;Standing;Walking;House hold activities    How long can you sit comfortably?  All sitting is uncomfortable, she has to shift a lot    How long can you stand comfortably?  5 minutes    How long can you walk comfortably?  5 minutes  Patient Stated Goals  Get rid of pain so she can walk and move better    Currently in Pain?  Yes    Pain Score  6     Pain Location  Hip    Pain Orientation  Right    Pain Descriptors / Indicators  Sharp;Shooting    Pain Type  Chronic pain    Pain Radiating Towards  right buttock, down back of right thigh to knee    Pain Onset  More than a month ago    Pain Frequency  Constant    Aggravating Factors   Standing or sitting extended period, going from seated to standing    Pain Relieving Factors  Heating pad, medication         [facility] PT Assessment - 10/16/19 0001      Assessment   Medical Diagnosis  Sciatica of right side    Referring Provider (PT)  Vevelyn Francois, NP    Onset Date/Surgical Date  --   started about 2  years ago   Hand Dominance  Right    Next MD Visit  12/08/2019    Prior Therapy  Yes      Precautions   Precautions  None      Restrictions   Weight Bearing Restrictions  No      Balance Screen   Has the patient fallen in the past 6 months  No    Has the patient had a decrease in activity level because of a fear of falling?   No    Is the patient reluctant to leave their home because of a fear of falling?   No      Home Environment   Living Environment  Private residence    Living Arrangements  Alone    Type of [place] Access  Stairs to enter    [employer]  --      Prior Function   Level of Independence  Independent    Vocation  On disability    Leisure  Going to church      Cognition   Overall Cognitive Status  Within Functional Limits for tasks assessed      Observation/Other Assessments   Observations  Patient appears in no apparent distress.    Focus on Therapeutic Outcomes (FOTO)   50% limitation      Sensation   Light Touch  Appears Intact      ROM / Strength   AROM / PROM / Strength  AROM;PROM;Strength      AROM   Overall AROM Comments  No worsening of right hip/leg pain with lumbar motion    AROM Assessment Site  Lumbar    Lumbar Flexion  WFL    Lumbar Extension  75%    Lumbar - Right Side Bend  WFL    Lumbar - Left Side Bend  WFL    Lumbar - Right Rotation  75%    Lumbar - Left Rotation  75%      PROM   Overall PROM Comments  Hip PROM grossly WFL bilaterally, greater tightness noted on right side    PROM Assessment Site  Hip    Right/Left Hip  Right;Left      Strength   Overall Strength Comments  Right hip pain with all right muscle testing    Strength Assessment Site  Hip;Knee    Right/Left Hip  Right;Left    Right Hip Flexion  4-/5  Right Hip Extension  3/5    Right Hip ABduction  3/5    Left Hip Flexion  4/5    Left Hip Extension  4-/5    Left Hip ABduction  3+/5    Right/Left Knee  Right;Left    Right Knee Flexion   4+/5    Right Knee Extension  4+/5    Left Knee Flexion  5/5    Left Knee Extension  5/5      Flexibility   Soft Tissue Assessment /Muscle Length  yes    Hamstrings  Limited more on right, did not reproduce concordant pain    Piriformis  Limited more on right       Palpation   Palpation comment  Mild TTP to right posterior and lateral gluteal region      Special Tests   Other special tests  Not performed      Transfers   Transfers  Independent with all Transfers      Ambulation/Gait   Ambulation/Gait  Yes    Ambulation/Gait Assistance  7: Independent    Gait Comments  Slightly antalgic on right                Objective measurements completed on examination: See above findings.      [place] Adult PT Treatment/Exercise - 10/16/19 0001      Exercises   Exercises  Knee/Hip      Knee/Hip Exercises: Stretches   Piriformis Stretch  2 reps;20 seconds    Piriformis Stretch Limitations  supine    Other Knee/Hip Stretches  Single knee to chest stretch 2x20 sec      Knee/Hip Exercises: Supine   Bridges  10 reps    Bridges Limitations  unable to clear hips, resembled more of a post pelvic tilt      Knee/Hip Exercises: [place]  x15             PT Education - 10/16/19 0934    Education Details  Exam findings, POC, HEP    Person(s) Educated  Patient    Methods  Explanation;Demonstration;Tactile cues;Verbal cues;Handout    Comprehension  Verbalized understanding;Returned demonstration;Verbal cues required;Tactile cues required;Need further instruction       PT Short Term Goals - 10/16/19 0935      PT SHORT TERM GOAL #1   Title  Patient will be I with initial HEP to progress with PT    Time  4    Period  Weeks    Status  New    Target Date  11/13/19      PT SHORT TERM GOAL #2   Title  Patient will be able to walk >/= 10 minutes with </= 5/10 to improve community access    Baseline  5 minutes at most    Time  4    Period  Weeks    Status   New    Target Date  11/13/19      PT SHORT TERM GOAL #3   Title  Patient will be able to negotiate stairs without limitation    Time  4    Period  Weeks    Status  New    Target Date  11/13/19        PT Long Term Goals - 10/16/19 1153      PT LONG TERM GOAL #1   Title  Patient will be I with final HEP to maintain progress from PT    Time  8  Period  Weeks    Status  New    Target Date  12/11/19      PT LONG TERM GOAL #2   Title  Patient will exhibit improved strength of core and gluteals >/= 4/5 MMT to be able to perform heavy activities around home with only a little bit of difficulty    Time  8    Period  Weeks    Status  New    Target Date  12/11/19      PT LONG TERM GOAL #3   Title  Patient will be able to sit and walk without deviation >/= 20 minutes with report </= 3/10 pain level to improve community access and negotaition    Time  8    Period  Weeks    Status  New    Target Date  12/11/19      PT LONG TERM GOAL #4   Title  Patient will report improved functional level to </= 38% limitation on FOTO    Time  8    Period  Weeks    Status  New    Target Date  12/11/19             Plan - 10/16/19 1056    Clinical Impression Statement  Patient presents to PT with chronic right buttock pain that travels down the posterior thight to the knee. Her symptoms do seem consistent with right sided sciatica. She exhibits limitation in right hip and hamstring flexibility compared to left, core and hip strength deficits, and gait deviations secondary to pain. She was provided with exercises to initiate stretching and strengthening or right hip and she would benefit from continued skilled PT to reduce her pain level and improve walking and movement ability.    Personal Factors and Comorbidities  Fitness;Past/Current Experience;Time since onset of injury/illness/exacerbation;Comorbidity 3+    Comorbidities  BMI, HTN, previous lumbar surgery in 2009    Examination-Activity  Limitations  Locomotion Level;Transfers;Bed Mobility;Bend;Sit;Squat;Stairs;Stand;Lift;Dressing;Carry    Examination-Participation Restrictions  Church;Meal Prep;Cleaning;Community Activity;Driving;Shop;Laundry;Yard Work    Stability/Clinical Decision Making  Stable/Uncomplicated    Clinical Decision Making  Low    Rehab Potential  Good    PT Frequency  1x / week    PT Duration  8 weeks    PT Treatment/Interventions  ADLs/Self Care Home Management;Cryotherapy;Electrical Stimulation;Moist Heat;Gait training;Stair training;Functional mobility training;Therapeutic activities;Therapeutic exercise;Balance training;Neuromuscular re-education;Patient/family education;Manual techniques;Dry needling;Passive range of motion;Taping;Spinal Manipulations;Joint Manipulations    PT Next Visit Plan  Assess HEP and progress PRN, NuStep, manual and modalities for right hip PRN, right hip and hamstring stretching, light core and gluteal strengthening    PT Home Exercise Plan  [id number]: supine single knee to chest stretch, supine piriformis stretch, side clamshell, bridge (limited ranged)    Consulted and Agree with Plan of Care  Patient       Patient will benefit from skilled therapeutic intervention in order to improve the following deficits and impairments:  Abnormal gait, Decreased range of motion, Difficulty walking, Decreased endurance, Decreased activity tolerance, Pain, Impaired flexibility, Decreased strength, Postural dysfunction  Visit Diagnosis: Sciatica, right side  Pain in right hip  Muscle weakness (generalized)  Other abnormalities of gait and mobility     Problem List Patient Active Problem List   Diagnosis Date Noted  . Vitamin D deficiency 09/10/2019  . CHF (congestive heart failure) ([place]) 01/30/2018  . OSA (obstructive sleep apnea) 03/01/2017  . ARF (acute renal failure) ([place]) 02/28/2017  . Hyponatremia 02/28/2017  . Syncope  07/23/2016  . Syncope and collapse 07/23/2016  . S/P  cardiac catheterization, non obstructive disease 06/04/15 06/05/2015  . CAD in native artery 06/05/2015  . Fever 06/05/2015  . Hypokalemia 06/05/2015  . Metabolic syndrome [id number]  . Chest pain, neg MI, possible GI 06/04/2015  . Colon cancer screening 08/05/2014  . Severe obesity (BMI >= 40) ([place]) 08/05/2014  . Health care maintenance 05/13/2014  . Prediabetes 05/13/2014  . Chronic migraine 04/27/2014  . Atypical chest pain 04/27/2014  . SOB (shortness of breath) 09/16/2013  . Palpitations 09/16/2013  . HTN (hypertension) 03/09/2012  . Hyperlipidemia 03/09/2012  . Microcytic anemia   . Morbid obesity with BMI of 50.0-59.9, adult ([place])   . Precordial chest pain 03/06/2012  . Dizziness 03/06/2012    Hilda Blades, PT, DPT, LAT, ATC 10/16/19  12:16 PM Phone: [phone] Fax: [facility] [address] [place], [place], [address] Phone: [phone]   Fax:  [phone]  Name: AHJAH RAUDENBUSH MRN: [id number] Date of Birth: August 03, 1960

## 2019-10-23 ENCOUNTER — Emergency Department (HOSPITAL_COMMUNITY)
Admission: EM | Admit: 2019-10-23 | Discharge: 2019-10-23 | Disposition: A | Discharge status: Home / Self Care | Payer: Medicare HMO | Attending: Emergency Medicine | Admitting: Emergency Medicine

## 2019-10-23 ENCOUNTER — Other Ambulatory Visit: Payer: Self-pay

## 2019-10-23 ENCOUNTER — Encounter (HOSPITAL_COMMUNITY): Payer: Self-pay | Admitting: *Deleted

## 2019-10-23 DIAGNOSIS — Z9049 Acquired absence of other specified parts of digestive tract: Secondary | ICD-10-CM | POA: Diagnosis not present

## 2019-10-23 DIAGNOSIS — M549 Dorsalgia, unspecified: Secondary | ICD-10-CM | POA: Diagnosis present

## 2019-10-23 DIAGNOSIS — I110 Hypertensive heart disease with heart failure: Secondary | ICD-10-CM | POA: Insufficient documentation

## 2019-10-23 DIAGNOSIS — M5441 Lumbago with sciatica, right side: Secondary | ICD-10-CM | POA: Diagnosis not present

## 2019-10-23 DIAGNOSIS — I509 Heart failure, unspecified: Secondary | ICD-10-CM | POA: Insufficient documentation

## 2019-10-23 DIAGNOSIS — Z7984 Long term (current) use of oral hypoglycemic drugs: Secondary | ICD-10-CM | POA: Insufficient documentation

## 2019-10-23 DIAGNOSIS — Z7982 Long term (current) use of aspirin: Secondary | ICD-10-CM | POA: Insufficient documentation

## 2019-10-23 DIAGNOSIS — Z79899 Other long term (current) drug therapy: Secondary | ICD-10-CM | POA: Diagnosis not present

## 2019-10-23 DIAGNOSIS — M5431 Sciatica, right side: Secondary | ICD-10-CM

## 2019-10-23 DIAGNOSIS — I2510 Atherosclerotic heart disease of native coronary artery without angina pectoris: Secondary | ICD-10-CM | POA: Diagnosis not present

## 2019-10-23 DIAGNOSIS — D573 Sickle-cell trait: Secondary | ICD-10-CM | POA: Insufficient documentation

## 2019-10-23 MED ORDER — DEXAMETHASONE SODIUM PHOSPHATE 10 MG/ML IJ SOLN
10.0000 mg | Freq: Once | INTRAMUSCULAR | Status: AC
  Administered 2019-10-23: 10 mg via INTRAMUSCULAR
  Filled 2019-10-23: qty 1

## 2019-10-23 MED ORDER — TRAMADOL HCL 50 MG PO TABS: 50.0000 mg | ORAL_TABLET | Freq: Four times a day (QID) | ORAL | 0 refills | Status: DC | PRN

## 2019-10-23 MED ORDER — PREDNISONE 50 MG PO TABS: 50.0000 mg | ORAL_TABLET | Freq: Every day | ORAL | 0 refills | Status: DC

## 2019-10-23 MED ORDER — CYCLOBENZAPRINE HCL 10 MG PO TABS: 10.0000 mg | ORAL_TABLET | Freq: Every day | ORAL | 0 refills | Status: DC

## 2019-10-23 NOTE — ED Provider Notes (Signed)
[place] EMERGENCY DEPARTMENT Provider Note   CSN: [id number] Arrival date & time: 10/23/19  1616     History Chief Complaint  Patient presents with  . Hip Pain  . Leg Pain    Hannah Huerta is a 59 y.o. female.  HPI Patient presents to the emergency department with back pain that radiates into her right leg.  Patient states she was seen by her doctor who sent her to physical therapy.  The patient states she went through 1 physical therapy treatment last Thursday.  The patient states that the pain continues shooting down her leg.  The patient states she is also had pain like this in the past and she states that she had surgery in her lower back in the past as well.  The patient states that she is seen by a neurosurgeon has not seen them recently.  The patient states that she is able to walk with there is increased pain with walking.  The patient denies chest pain, shortness of breath, headache,blurred vision, neck pain, fever, cough, weakness, numbness, dizziness, anorexia, edema, abdominal pain, nausea, vomiting, diarrhea, rash, incontinence, dysuria, hematemesis, bloody stool, near syncope, or syncope.  Past Medical History:  Diagnosis Date  . Abnormal Pap smear of cervix    pt couldnt state what type of abnormality  . Anemia   . CAD in native artery 06/05/2015  . Chest pain    a. 02/2012 abnl myoview - inferoapical reverisbility concerning for ischemia, EF 59%;   02/2012 nonobstructive cath  . CHF (congestive heart failure) ([place])   . Chronic migraine   . GERD (gastroesophageal reflux disease)   . HLD (hyperlipidemia)   . Hypertension   . Hypokalemia 06/05/2015  . Metabolic syndrome [id number]  . Morbid obesity with BMI of 50.0-59.9, adult ([place])   . Prediabetes   . S/P cardiac catheterization, non obstructive disease 06/04/15 06/05/2015  . Sickle cell trait ([place])   . Syncope     Patient Active Problem List   Diagnosis Date Noted  . Vitamin D deficiency  09/10/2019  . CHF (congestive heart failure) ([place]) 01/30/2018  . OSA (obstructive sleep apnea) 03/01/2017  . ARF (acute renal failure) ([place]) 02/28/2017  . Hyponatremia 02/28/2017  . Syncope 07/23/2016  . Syncope and collapse 07/23/2016  . S/P cardiac catheterization, non obstructive disease 06/04/15 06/05/2015  . CAD in native artery 06/05/2015  . Fever 06/05/2015  . Hypokalemia 06/05/2015  . Metabolic syndrome [id number]  . Chest pain, neg MI, possible GI 06/04/2015  . Colon cancer screening 08/05/2014  . Severe obesity (BMI >= 40) ([place]) 08/05/2014  . Health care maintenance 05/13/2014  . Prediabetes 05/13/2014  . Chronic migraine 04/27/2014  . Atypical chest pain 04/27/2014  . SOB (shortness of breath) 09/16/2013  . Palpitations 09/16/2013  . HTN (hypertension) 03/09/2012  . Hyperlipidemia 03/09/2012  . Microcytic anemia   . Morbid obesity with BMI of 50.0-59.9, adult ([place])   . Precordial chest pain 03/06/2012  . Dizziness 03/06/2012    Past Surgical History:  Procedure Laterality Date  . BACK SURGERY     x1 lumbar fusion  . CARDIAC CATHETERIZATION N/A 06/04/2015   Procedure: Left Heart Cath and Coronary Angiography;  Surgeon: [name], MD;  Location: [place] CV LAB;  Service: Cardiovascular;  Laterality: N/A;  . CHOLECYSTECTOMY     laparoscopic  . COLONOSCOPY WITH PROPOFOL N/A 09/04/2014   Procedure: COLONOSCOPY WITH PROPOFOL;  Surgeon: [name], MD;  Location: [facility];  Service: Endoscopy;  Laterality: N/A;  . LEFT HEART CATHETERIZATION WITH CORONARY ANGIOGRAM N/A 03/08/2012   Procedure: LEFT HEART CATHETERIZATION WITH CORONARY ANGIOGRAM;  Surgeon: Hillary Bow, MD;  Location: [facility] CATH LAB;  Service: Cardiovascular;  Laterality: N/A;  . SPINE SURGERY     fusion  . TUBAL LIGATION       OB History    Gravida  5   Para  3   Term      Preterm      AB  2   Living  3     SAB      TAB  2   Ectopic      Multiple      Live Births               Family History  Problem Relation Age of Onset  . Coronary artery disease Father 60  . Heart disease Father   . Other Father        died in a MVA  . Hypertension Father   . Sudden death Mother 64       Questionable MI  . Diabetes Mother   . Hypertension Mother   . Hypertension Sister   . Pancreatic cancer Maternal Aunt   . Breast cancer Maternal Aunt        after age 70  . Stroke Paternal Grandmother   . Colon polyps Sister   . Colon cancer Neg Hx   . Gallbladder disease Neg Hx   . Esophageal cancer Neg Hx     Social History   Tobacco Use  . Smoking status: Never Smoker  . Smokeless tobacco: Never Used  Substance Use Topics  . Alcohol use: No  . Drug use: No    Home Medications Prior to Admission medications   Medication Sig Start Date End Date Taking? Authorizing Provider  amLODipine (NORVASC) 5 MG tablet Take 1 tablet (5 mg total) by mouth daily. 09/11/19  Yes Vevelyn Francois, NP  aspirin EC 81 MG tablet Take 1 tablet (81 mg total) by mouth daily. 03/07/17  Yes Dorena Dew, FNP  atorvastatin (LIPITOR) 40 MG tablet Take 1 tablet (40 mg total) by mouth daily at 6 PM. 10/09/19  Yes Vevelyn Francois, NP  carvedilol (COREG) 6.25 MG tablet TAKE 1 TABLET BY MOUTH 2 TIMES DAILY WITH A MEAL. 10/09/19  Yes Vevelyn Francois, NP  ergocalciferol (VITAMIN D2) 1.25 MG (50000 UT) capsule Take 1 capsule (50,000 Units total) by mouth once a week. X 12 weeks. 09/10/19 12/09/19 Yes Vevelyn Francois, NP  metFORMIN (GLUCOPHAGE) 500 MG tablet Take 1 tablet (500 mg total) by mouth daily with breakfast. 09/11/19  Yes Vevelyn Francois, NP  spironolactone (ALDACTONE) 25 MG tablet Take 1 tablet (25 mg total) by mouth daily. 10/09/19 11/08/19 Yes Vevelyn Francois, NP  cyclobenzaprine (FLEXERIL) 10 MG tablet Take 1 tablet (10 mg total) by mouth at bedtime. 10/23/19   Jeyren Danowski, Harrell Gave, PA-C  hydrocortisone (ANUSOL-HC) 25 MG suppository Place 1 suppository (25 mg total) rectally 2 (two) times  daily as needed for hemorrhoids or anal itching. Patient not taking: Reported on 09/08/2019 02/05/19   Petrucelli, Aldona Bar R, PA-C  polyethylene glycol (MIRALAX) 17 g packet Take 17 g by mouth daily as needed for moderate constipation. Patient not taking: Reported on 09/08/2019 02/05/19   Petrucelli, Glynda Jaeger, PA-C  predniSONE (DELTASONE) 50 MG tablet Take 1 tablet (50 mg total) by mouth daily with breakfast. 10/23/19   Shalla Bulluck,  Shanise Balch, PA-C  traMADol (ULTRAM) 50 MG tablet Take 1 tablet (50 mg total) by mouth every 6 (six) hours as needed for severe pain. 10/23/19   Cuyler Vandyken, Harrell Gave, PA-C    Allergies    Patient has no known allergies.  Review of Systems   Review of Systems All other systems negative except as documented in the HPI. All pertinent positives and negatives as reviewed in the HPI. Physical Exam Updated Vital Signs BP (!) 146/77 (BP Location: Right Wrist)   Pulse 66   Temp 98.9 F (37.2 C) (Oral)   Resp 18   LMP 09/15/2013   SpO2 100%   Physical Exam Vitals and nursing note reviewed.  Constitutional:      General: She is not in acute distress.    Appearance: She is well-developed.  HENT:     Head: Normocephalic and atraumatic.  Eyes:     Pupils: Pupils are equal, round, and reactive to light.  Pulmonary:     Effort: Pulmonary effort is normal.  Musculoskeletal:     Lumbar back: Spasms and tenderness present. No swelling, edema, deformity, signs of trauma or bony tenderness. Normal range of motion. Negative right straight leg raise test and negative left straight leg raise test.       Back:  Skin:    General: Skin is warm and dry.  Neurological:     Mental Status: She is alert and oriented to person, place, and time.     Sensory: No sensory deficit.     Motor: No weakness.     Coordination: Coordination normal.     Gait: Gait normal.     Deep Tendon Reflexes: Reflexes normal.     ED Results / Procedures / Treatments   Labs (all labs ordered are  listed, but only abnormal results are displayed) Labs Reviewed - No data to display  EKG None  Radiology No results found.  Procedures Procedures (including critical care time)  Medications Ordered in ED Medications  dexamethasone (DECADRON) injection 10 mg (10 mg Intramuscular Given 10/23/19 1852)    ED Course  I have reviewed the triage vital signs and the nursing notes.  Pertinent labs & imaging results that were available during my care of the patient were reviewed by me and considered in my medical decision making (see chart for details).    MDM Rules/Calculators/A&P                      She will be referred back to neurosurgery.  I did give her treatment for sciatica.  I have advised the patient to return here for any worsening her condition.  The patient normal reflexes and normal gait.  The patient has been otherwise stable here in the emergency department as well.  At this point I do not find any clinical evidence of severe or emergent back issues that need to be further exam with MRI or admission to the hospital at this time.  The patient's vital signs remained stable as well. Final Clinical Impression(s) / ED Diagnoses Final diagnoses:  Sciatica of right side    Rx / DC Orders ED Discharge Orders         Ordered    predniSONE (DELTASONE) 50 MG tablet  Daily with breakfast     10/23/19 1841    traMADol (ULTRAM) 50 MG tablet  Every 6 hours PRN     10/23/19 1841    cyclobenzaprine (FLEXERIL) 10 MG tablet  Daily at bedtime  10/23/19 1841           Dalia Heading, PA-C 10/23/19 2154    Drenda Freeze, MD 10/23/19 2325

## 2019-10-23 NOTE — ED Triage Notes (Signed)
To ED for eval of right hip pain and right leg pain. States she is going to physical therapy for sciatica - pain is worse. Able to walk but with pain. Complains of burning down the back of her leg. Unknown injury. Has had no films.

## 2019-10-23 NOTE — Discharge Instructions (Signed)
Return here as needed.  You need follow-up with your neurosurgeon.

## 2019-10-31 ENCOUNTER — Other Ambulatory Visit: Payer: Self-pay

## 2019-10-31 ENCOUNTER — Ambulatory Visit: Payer: Medicare HMO | Admitting: Physical Therapy

## 2019-10-31 ENCOUNTER — Encounter: Payer: Self-pay | Admitting: Physical Therapy

## 2019-10-31 DIAGNOSIS — M25551 Pain in right hip: Secondary | ICD-10-CM

## 2019-10-31 DIAGNOSIS — R2689 Other abnormalities of gait and mobility: Secondary | ICD-10-CM | POA: Diagnosis not present

## 2019-10-31 DIAGNOSIS — M5431 Sciatica, right side: Secondary | ICD-10-CM

## 2019-10-31 DIAGNOSIS — M6281 Muscle weakness (generalized): Secondary | ICD-10-CM

## 2019-10-31 NOTE — Patient Instructions (Signed)
Access Code: [email or web address] URL: [email or web address] Date: 10/31/2019 Prepared by: Hessie Diener  Exercises Hooklying Clamshell with Resistance - 2 x daily - 7 x weekly - 10 reps - 3 sets - 5 hold Supine Active Straight Leg Raise - 2 x daily - 7 x weekly - 2 sets - 10 reps Supine Hamstring Stretch with Strap - 2 x daily - 7 x weekly - 1 sets - 2 reps - 20 hold Supine ITB Stretch with Strap - 2 x daily - 7 x weekly - 1 sets - 2 reps - 20 hold

## 2019-10-31 NOTE — Therapy (Signed)
[place], [place], [address] Phone: [phone]   Fax:  [phone]  Physical Therapy Treatment  Patient Details  Name: Hannah Huerta MRN: [id number] Date of Birth: Mar 25, 1961 Referring Provider (PT): Vevelyn Francois, NP   Encounter Date: 10/31/2019  PT End of Session - 10/31/19 0924    Visit Number  2    Number of Visits  8    Date for PT Re-Evaluation  12/11/19    Authorization Type  [employer] MCR    PT Start Time  0845    PT Stop Time  0925    PT Time Calculation (min)  40 min       Past Medical History:  Diagnosis Date  . Abnormal Pap smear of cervix    pt couldnt state what type of abnormality  . Anemia   . CAD in native artery 06/05/2015  . Chest pain    a. 02/2012 abnl myoview - inferoapical reverisbility concerning for ischemia, EF 59%;   02/2012 nonobstructive cath  . CHF (congestive heart failure) ([place])   . Chronic migraine   . GERD (gastroesophageal reflux disease)   . HLD (hyperlipidemia)   . Hypertension   . Hypokalemia 06/05/2015  . Metabolic syndrome [id number]  . Morbid obesity with BMI of 50.0-59.9, adult ([place])   . Prediabetes   . S/P cardiac catheterization, non obstructive disease 06/04/15 06/05/2015  . Sickle cell trait ([place])   . Syncope     Past Surgical History:  Procedure Laterality Date  . BACK SURGERY     x1 lumbar fusion  . CARDIAC CATHETERIZATION N/A 06/04/2015   Procedure: Left Heart Cath and Coronary Angiography;  Surgeon: [name], MD;  Location: [facility];  Service: Cardiovascular;  Laterality: N/A;  . CHOLECYSTECTOMY     laparoscopic  . COLONOSCOPY WITH PROPOFOL N/A 09/04/2014   Procedure: COLONOSCOPY WITH PROPOFOL;  Surgeon: [name], MD;  Location: [facility];  Service: Endoscopy;  Laterality: N/A;  . LEFT HEART CATHETERIZATION WITH CORONARY ANGIOGRAM N/A 03/08/2012   Procedure: LEFT HEART CATHETERIZATION WITH CORONARY ANGIOGRAM;   Surgeon: Hillary Bow, MD;  Location: [facility];  Service: Cardiovascular;  Laterality: N/A;  . SPINE SURGERY     fusion  . TUBAL LIGATION      There were no vitals filed for this visit.  Subjective Assessment - 10/31/19 0847    Subjective  I had to go to the ED thursday. They gave me pain pills and I will F/U with neuro doctor next Tuesday.    Currently in Pain?  Yes    Pain Score  5     Pain Location  Back    Pain Orientation  Right;Lower    Pain Descriptors / Indicators  Sore    Pain Radiating Towards  right buttock and thigh    Aggravating Factors   standing or sitting extended, going from prolonged sitting to standing.    Pain Relieving Factors  heating pad , meds                       [facility] Adult PT Treatment/Exercise - 10/31/19 0001      Lumbar Exercises: Stretches   Lower Trunk Rotation  10 seconds    Lower Trunk Rotation Limitations  10 reps    Active Hamstring Stretch  2 reps;20 seconds    Active Hamstring Stretch Limitations  strap     ITB Stretch  2 reps;20 seconds    ITB Stretch Limitations  strap     Piriformis Stretch  2 reps;20 seconds    Piriformis Stretch Limitations  supine      Lumbar Exercises: Supine   Pelvic Tilt  10 reps      Knee/Hip Exercises: Stretches   Other Knee/Hip Stretches  Single knee to chest stretch 2x20 sec      Knee/Hip Exercises: Supine   Bridges  10 reps    Straight Leg Raises  10 reps    Straight Leg Raises Limitations  Rt/Lt 2 sets     Other Supine Knee/Hip Exercises  clam with blue band  10 x 2       Knee/Hip Exercises: Sidelying   Clams  x15 right       Manual Therapy   Manual therapy comments  massage roller to right posteriolateral hip and thigh              PT Education - 10/31/19 0930    Education Details  HEP    Person(s) Educated  Patient    Methods  Explanation;Handout    Comprehension  Verbalized understanding       PT Short Term Goals - 10/16/19 0935      PT SHORT TERM GOAL  #1   Title  Patient will be I with initial HEP to progress with PT    Time  4    Period  Weeks    Status  New    Target Date  11/13/19      PT SHORT TERM GOAL #2   Title  Patient will be able to walk >/= 10 minutes with </= 5/10 to improve community access    Baseline  5 minutes at most    Time  4    Period  Weeks    Status  New    Target Date  11/13/19      PT SHORT TERM GOAL #3   Title  Patient will be able to negotiate stairs without limitation    Time  4    Period  Weeks    Status  New    Target Date  11/13/19        PT Long Term Goals - 10/16/19 1153      PT LONG TERM GOAL #1   Title  Patient will be I with final HEP to maintain progress from PT    Time  8    Period  Weeks    Status  New    Target Date  12/11/19      PT LONG TERM GOAL #2   Title  Patient will exhibit improved strength of core and gluteals >/= 4/5 MMT to be able to perform heavy activities around home with only a little bit of difficulty    Time  8    Period  Weeks    Status  New    Target Date  12/11/19      PT LONG TERM GOAL #3   Title  Patient will be able to sit and walk without deviation >/= 20 minutes with report </= 3/10 pain level to improve community access and negotaition    Time  8    Period  Weeks    Status  New    Target Date  12/11/19      PT LONG TERM GOAL #4   Title  Patient will report improved functional level to </= 38% limitation on FOTO    Time  8    Period  Weeks    Status  New    Target Date  12/11/19            Plan - 10/31/19 0949    Clinical Impression Statement  Pt arrives reporting ED visits due to increased pain. She is compliant with HEP. Noted tightness and TTP right gluteals and Lateral thigh. Massage roller and Manual STW used to decreased mucles tension and pain. She felt sore after STW and also felt looser in that area. Continued wit review of HEP and progression of hip strength and flexibility. She was unable to maintain DF with Right SLR. In  sitting she has less than full AROM DF.    PT Next Visit Plan  CHECK DF MMT ; Assess HEP and progress PRN, NuStep, manual and modalities for right hip PRN, right hip and hamstring stretching, light core and gluteal strengthening    PT Home Exercise Plan  [id number]: supine single knee to chest stretch, supine piriformis stretch, side clamshell, bridge (limited ranged), added supine clam blue band, supine hamstring and ITB with strap, SLR with DF       Patient will benefit from skilled therapeutic intervention in order to improve the following deficits and impairments:  Abnormal gait, Decreased range of motion, Difficulty walking, Decreased endurance, Decreased activity tolerance, Pain, Impaired flexibility, Decreased strength, Postural dysfunction  Visit Diagnosis: Sciatica, right side  Pain in right hip  Muscle weakness (generalized)  Other abnormalities of gait and mobility     Problem List Patient Active Problem List   Diagnosis Date Noted  . Vitamin D deficiency 09/10/2019  . CHF (congestive heart failure) ([place]) 01/30/2018  . OSA (obstructive sleep apnea) 03/01/2017  . ARF (acute renal failure) ([place]) 02/28/2017  . Hyponatremia 02/28/2017  . Syncope 07/23/2016  . Syncope and collapse 07/23/2016  . S/P cardiac catheterization, non obstructive disease 06/04/15 06/05/2015  . CAD in native artery 06/05/2015  . Fever 06/05/2015  . Hypokalemia 06/05/2015  . Metabolic syndrome [id number]  . Chest pain, neg MI, possible GI 06/04/2015  . Colon cancer screening 08/05/2014  . Severe obesity (BMI >= 40) ([place]) 08/05/2014  . Health care maintenance 05/13/2014  . Prediabetes 05/13/2014  . Chronic migraine 04/27/2014  . Atypical chest pain 04/27/2014  . SOB (shortness of breath) 09/16/2013  . Palpitations 09/16/2013  . HTN (hypertension) 03/09/2012  . Hyperlipidemia 03/09/2012  . Microcytic anemia   . Morbid obesity with BMI of 50.0-59.9, adult ([place])   . Precordial chest pain  03/06/2012  . Dizziness 03/06/2012    Dorene Ar, PTA 10/31/2019, 9:53 AM  [facility] [address] [place], [place], [address] Phone: [phone]   Fax:  [phone]  Name: Hannah Huerta MRN: [id number] Date of Birth: 03-18-1961

## 2019-11-04 DIAGNOSIS — Z6841 Body Mass Index (BMI) 40.0 and over, adult: Secondary | ICD-10-CM | POA: Insufficient documentation

## 2019-11-04 DIAGNOSIS — M5441 Lumbago with sciatica, right side: Secondary | ICD-10-CM | POA: Diagnosis not present

## 2019-11-04 DIAGNOSIS — G8929 Other chronic pain: Secondary | ICD-10-CM | POA: Insufficient documentation

## 2019-11-04 DIAGNOSIS — I10 Essential (primary) hypertension: Secondary | ICD-10-CM | POA: Diagnosis not present

## 2019-11-04 DIAGNOSIS — M5136 Other intervertebral disc degeneration, lumbar region: Secondary | ICD-10-CM | POA: Insufficient documentation

## 2019-11-04 DIAGNOSIS — M4316 Spondylolisthesis, lumbar region: Secondary | ICD-10-CM | POA: Insufficient documentation

## 2019-11-04 DIAGNOSIS — M48061 Spinal stenosis, lumbar region without neurogenic claudication: Secondary | ICD-10-CM | POA: Insufficient documentation

## 2019-11-06 ENCOUNTER — Encounter: Payer: Self-pay | Admitting: Physical Therapy

## 2019-11-06 ENCOUNTER — Other Ambulatory Visit: Payer: Self-pay

## 2019-11-06 ENCOUNTER — Ambulatory Visit: Payer: Medicare HMO | Admitting: Physical Therapy

## 2019-11-06 DIAGNOSIS — R2689 Other abnormalities of gait and mobility: Secondary | ICD-10-CM

## 2019-11-06 DIAGNOSIS — M6281 Muscle weakness (generalized): Secondary | ICD-10-CM

## 2019-11-06 DIAGNOSIS — M25551 Pain in right hip: Secondary | ICD-10-CM | POA: Diagnosis not present

## 2019-11-06 DIAGNOSIS — M5431 Sciatica, right side: Secondary | ICD-10-CM

## 2019-11-06 NOTE — Patient Instructions (Signed)
Access Code: [email or web address] URL: [email or web address] Date: 11/06/2019 Prepared by: Hilda Blades  Exercises Hooklying Single Knee to Chest Stretch - 1 x daily - 7 x weekly - 3 reps - 20 seconds hold Supine Piriformis Stretch with Foot on Ground - 1 x daily - 7 x weekly - 3 reps - 20 seconds hold Hooklying Hamstring Stretch with Strap - 1 x daily - 7 x weekly - 3 sets - 3 reps - 20 seconds hold Supine Lower Trunk Rotation - 1 x daily - 7 x weekly - 10 reps - 5 seconds hold Supine March - 1 x daily - 7 x weekly - 2 sets - 10 reps Bridge - 1 x daily - 7 x weekly - 10 reps - 3 seconds hold Hooklying Clamshell with Resistance - 1 x daily - 7 x weekly - 2 sets - 10 reps Supine Active Straight Leg Raise - 1 x daily - 7 x weekly - 2 sets - 10 reps

## 2019-11-06 NOTE — Therapy (Signed)
[place], [place], [address] Phone: [phone]   Fax:  [phone]  Physical Therapy Treatment  Patient Details  Name: Hannah Huerta MRN: [id number] Date of Birth: [birth year] Referring Provider (PT): Vevelyn Francois, NP   Encounter Date: 11/06/2019  PT End of Session - 11/06/19 1121    Visit Number  3    Number of Visits  8    Date for PT Re-Evaluation  12/11/19    Authorization Type  [employer] MCR    PT Start Time  1125    PT Stop Time  1208    PT Time Calculation (min)  43 min    Activity Tolerance  Patient tolerated treatment well    Behavior During Therapy  [facility] for tasks assessed/performed       Past Medical History:  Diagnosis Date  . Abnormal Pap smear of cervix    pt couldnt state what type of abnormality  . Anemia   . CAD in native artery 06/05/2015  . Chest pain    a. 02/2012 abnl myoview - inferoapical reverisbility concerning for ischemia, EF 59%;   02/2012 nonobstructive cath  . CHF (congestive heart failure) ([place])   . Chronic migraine   . GERD (gastroesophageal reflux disease)   . HLD (hyperlipidemia)   . Hypertension   . Hypokalemia 06/05/2015  . Metabolic syndrome [id number]  . Morbid obesity with BMI of 50.0-59.9, adult ([place])   . Prediabetes   . S/P cardiac catheterization, non obstructive disease 06/04/15 06/05/2015  . Sickle cell trait ([place])   . Syncope     Past Surgical History:  Procedure Laterality Date  . BACK SURGERY     x1 lumbar fusion  . CARDIAC CATHETERIZATION N/A 06/04/2015   Procedure: Left Heart Cath and Coronary Angiography;  Surgeon: [name], MD;  Location: [facility];  Service: Cardiovascular;  Laterality: N/A;  . CHOLECYSTECTOMY     laparoscopic  . COLONOSCOPY WITH PROPOFOL N/A 09/04/2014   Procedure: COLONOSCOPY WITH PROPOFOL;  Surgeon: [name], MD;  Location: [facility];  Service: Endoscopy;  Laterality: N/A;  . LEFT HEART  CATHETERIZATION WITH CORONARY ANGIOGRAM N/A 03/08/2012   Procedure: LEFT HEART CATHETERIZATION WITH CORONARY ANGIOGRAM;  Surgeon: Hillary Bow, MD;  Location: [facility] CATH LAB;  Service: Cardiovascular;  Laterality: N/A;  . SPINE SURGERY     fusion  . TUBAL LIGATION      There were no vitals filed for this visit.  Subjective Assessment - 11/06/19 1120    Subjective  Patient saw her surgeon who originally operated on her back years ago and he wants her to schedule an MRI. She reports currently she has days where she feels good but other days where she has a lot of pain. She states that too much driving seems to aggravate her pain. She has been sleeping with a pillow between her knees and feels that helps.    Patient Stated Goals  Get rid of pain so she can walk and move better    Currently in Pain?  Yes    Pain Score  5     Pain Location  Hip    Pain Orientation  Right    Pain Descriptors / Indicators  Sore    Pain Type  Chronic pain    Pain Radiating Towards  right buttock, posterior thigh to knee    Pain Onset  More than a month ago    Pain Frequency  Constant    Aggravating Factors   driving too much,         [facility] PT Assessment - 11/06/19 0001      Strength   Strength Assessment Site  Hip;Knee;Ankle    Right Hip Flexion  4-/5    Right Hip Extension  3+/5    Right Hip ABduction  3+/5    Left Hip Flexion  4/5    Left Hip Extension  4-/5    Left Hip ABduction  3+/5    Right Knee Flexion  4+/5    Right Knee Extension  4+/5    Left Knee Flexion  5/5    Left Knee Extension  5/5    Right/Left Ankle  Right;Left   right great toe extension 2/5   Right Ankle Dorsiflexion  2/5    Right Ankle Plantar Flexion  4/5    Right Ankle Eversion  4/5    Left Ankle Dorsiflexion  5/5    Left Ankle Plantar Flexion  4/5    Left Ankle Eversion  4/5                   [facility] Adult PT Treatment/Exercise - 11/06/19 0001      Exercises   Exercises  Knee/Hip      Lumbar Exercises:  Stretches   Lower Trunk Rotation Limitations  5 sec hold x10      Lumbar Exercises: Supine   Clam  10 reps   2 sets   Clam Limitations  cued for core engagement    Bent Knee Raise  10 reps   2 sets   Bent Knee Raise Limitations  marching      Knee/Hip Exercises: Stretches   Passive Hamstring Stretch  2 reps;20 seconds    Passive Hamstring Stretch Limitations  supine    Piriformis Stretch  2 reps;20 seconds    Piriformis Stretch Limitations  supine    Other Knee/Hip Stretches  Single knee to chest stretch 2x20 sec      Knee/Hip Exercises: Aerobic   Nustep  L4 x 5 min (UE and LE)      Knee/Hip Exercises: Supine   Bridges  10 reps   3 sec hold   Straight Leg Raises  10 reps    Straight Leg Raises Limitations  cued for core engagement      Knee/Hip Exercises: Sidelying   Hip ABduction  2 sets;10 reps             PT Education - 11/06/19 1121    Education Details  HEP    Person(s) Educated  Patient    Methods  Explanation;Demonstration;Verbal cues;Handout    Comprehension  Verbalized understanding;Returned demonstration;Verbal cues required;Need further instruction       PT Short Term Goals - 10/16/19 0935      PT SHORT TERM GOAL #1   Title  Patient will be I with initial HEP to progress with PT    Time  4    Period  Weeks    Status  New    Target Date  11/13/19      PT SHORT TERM GOAL #2   Title  Patient will be able to walk >/= 10 minutes with </= 5/10 to improve community access    Baseline  5 minutes at most    Time  4    Period  Weeks    Status  New    Target Date  11/13/19      PT SHORT  TERM GOAL #3   Title  Patient will be able to negotiate stairs without limitation    Time  4    Period  Weeks    Status  New    Target Date  11/13/19        PT Long Term Goals - 10/16/19 1153      PT LONG TERM GOAL #1   Title  Patient will be I with final HEP to maintain progress from PT    Time  8    Period  Weeks    Status  New    Target Date  12/11/19       PT LONG TERM GOAL #2   Title  Patient will exhibit improved strength of core and gluteals >/= 4/5 MMT to be able to perform heavy activities around home with only a little bit of difficulty    Time  8    Period  Weeks    Status  New    Target Date  12/11/19      PT LONG TERM GOAL #3   Title  Patient will be able to sit and walk without deviation >/= 20 minutes with report </= 3/10 pain level to improve community access and negotaition    Time  8    Period  Weeks    Status  New    Target Date  12/11/19      PT LONG TERM GOAL #4   Title  Patient will report improved functional level to </= 38% limitation on FOTO    Time  8    Period  Weeks    Status  New    Target Date  12/11/19            Plan - 11/06/19 1121    Clinical Impression Statement  Patient tolerated therapy well with no advers effects. She continues to exhibit gross weakness of hips and core, and what seems to be neurologic weakness of right ankle DF and great toe extension that patient states to have been present for a couple of years now. She did not report any worsening of sciatic symptoms this visit but reports she could feel soreness in the right hip with stretching and strengthening exercises. Her HEP was progressed this visit and she would benefit from continued skilled PT to improve mobility and strength and reduce pain with activity.    PT Treatment/Interventions  ADLs/Self Care Home Management;Cryotherapy;Electrical Stimulation;Moist Heat;Gait training;Stair training;Functional mobility training;Therapeutic activities;Therapeutic exercise;Balance training;Neuromuscular re-education;Patient/family education;Manual techniques;Dry needling;Passive range of motion;Taping;Spinal Manipulations;Joint Manipulations    PT Next Visit Plan  Assess HEP and progress PRN, NuStep, manual and modalities for right hip PRN, right hip and hamstring stretching, light core and gluteal strengthening    PT Home Exercise Plan   [id number]: supine single knee to chest stretch, supine piriformis stretch, hooklying hamstring stretch with strap, LTR, supine marching, supine clam blue band, SLR    Consulted and Agree with Plan of Care  Patient       Patient will benefit from skilled therapeutic intervention in order to improve the following deficits and impairments:  Abnormal gait, Decreased range of motion, Difficulty walking, Decreased endurance, Decreased activity tolerance, Pain, Impaired flexibility, Decreased strength, Postural dysfunction  Visit Diagnosis: Sciatica, right side  Pain in right hip  Muscle weakness (generalized)  Other abnormalities of gait and mobility     Problem List Patient Active Problem List   Diagnosis Date Noted  . Vitamin D deficiency 09/10/2019  . CHF (congestive  heart failure) ([place]) 01/30/2018  . OSA (obstructive sleep apnea) 03/01/2017  . ARF (acute renal failure) ([place]) 02/28/2017  . Hyponatremia 02/28/2017  . Syncope 07/23/2016  . Syncope and collapse 07/23/2016  . S/P cardiac catheterization, non obstructive disease 06/04/15 06/05/2015  . CAD in native artery 06/05/2015  . Fever 06/05/2015  . Hypokalemia 06/05/2015  . Metabolic syndrome [id number]  . Chest pain, neg MI, possible GI 06/04/2015  . Colon cancer screening 08/05/2014  . Severe obesity (BMI >= 40) ([place]) 08/05/2014  . Health care maintenance 05/13/2014  . Prediabetes 05/13/2014  . Chronic migraine 04/27/2014  . Atypical chest pain 04/27/2014  . SOB (shortness of breath) 09/16/2013  . Palpitations 09/16/2013  . HTN (hypertension) 03/09/2012  . Hyperlipidemia 03/09/2012  . Microcytic anemia   . Morbid obesity with BMI of 50.0-59.9, adult ([place])   . Precordial chest pain 03/06/2012  . Dizziness 03/06/2012    Hilda Blades, PT, DPT, LAT, ATC 11/06/19  12:21 PM Phone: [phone] Fax: [facility] [address] [place], [place],  [address] Phone: [phone]   Fax:  [phone]  Name: Hannah Huerta MRN: [id number] Date of Birth: May 28, 1961

## 2019-11-11 ENCOUNTER — Other Ambulatory Visit: Payer: Self-pay | Admitting: Neurosurgery

## 2019-11-11 DIAGNOSIS — G8929 Other chronic pain: Secondary | ICD-10-CM

## 2019-11-13 ENCOUNTER — Other Ambulatory Visit: Payer: Self-pay

## 2019-11-13 ENCOUNTER — Encounter: Payer: Self-pay | Admitting: Physical Therapy

## 2019-11-13 ENCOUNTER — Ambulatory Visit: Payer: Medicare HMO | Admitting: Physical Therapy

## 2019-11-13 DIAGNOSIS — M25551 Pain in right hip: Secondary | ICD-10-CM | POA: Diagnosis not present

## 2019-11-13 DIAGNOSIS — R2689 Other abnormalities of gait and mobility: Secondary | ICD-10-CM | POA: Diagnosis not present

## 2019-11-13 DIAGNOSIS — M6281 Muscle weakness (generalized): Secondary | ICD-10-CM | POA: Diagnosis not present

## 2019-11-13 DIAGNOSIS — M5431 Sciatica, right side: Secondary | ICD-10-CM

## 2019-11-13 NOTE — Therapy (Signed)
[place], [place], [address] Phone: [phone]   Fax:  [phone]  Physical Therapy Treatment  Patient Details  Name: Hannah Huerta MRN: [id number] Date of Birth: 06-Jan-1961 Referring Provider (PT): Vevelyn Francois, NP   Encounter Date: 11/13/2019  PT End of Session - 11/13/19 0919    Visit Number  4    Number of Visits  8    Date for PT Re-Evaluation  12/11/19    Authorization Type  [employer] MCR    Authorization Time Period  10/16/2019 - 12/11/2019    Authorization - Visit Number  4    Authorization - Number of Visits  8    PT Start Time  0912    PT Stop Time  0955    PT Time Calculation (min)  43 min    Activity Tolerance  Patient tolerated treatment well    Behavior During Therapy  [facility] for tasks assessed/performed       Past Medical History:  Diagnosis Date  . Abnormal Pap smear of cervix    pt couldnt state what type of abnormality  . Anemia   . CAD in native artery 06/05/2015  . Chest pain    a. 02/2012 abnl myoview - inferoapical reverisbility concerning for ischemia, EF 59%;   02/2012 nonobstructive cath  . CHF (congestive heart failure) ([place])   . Chronic migraine   . GERD (gastroesophageal reflux disease)   . HLD (hyperlipidemia)   . Hypertension   . Hypokalemia 06/05/2015  . Metabolic syndrome [id number]  . Morbid obesity with BMI of 50.0-59.9, adult ([place])   . Prediabetes   . S/P cardiac catheterization, non obstructive disease 06/04/15 06/05/2015  . Sickle cell trait ([place])   . Syncope     Past Surgical History:  Procedure Laterality Date  . BACK SURGERY     x1 lumbar fusion  . CARDIAC CATHETERIZATION N/A 06/04/2015   Procedure: Left Heart Cath and Coronary Angiography;  Surgeon: [name], MD;  Location: [facility];  Service: Cardiovascular;  Laterality: N/A;  . CHOLECYSTECTOMY     laparoscopic  . COLONOSCOPY WITH PROPOFOL N/A 09/04/2014   Procedure: COLONOSCOPY WITH  PROPOFOL;  Surgeon: [name], MD;  Location: [facility];  Service: Endoscopy;  Laterality: N/A;  . LEFT HEART CATHETERIZATION WITH CORONARY ANGIOGRAM N/A 03/08/2012   Procedure: LEFT HEART CATHETERIZATION WITH CORONARY ANGIOGRAM;  Surgeon: Hillary Bow, MD;  Location: [facility];  Service: Cardiovascular;  Laterality: N/A;  . SPINE SURGERY     fusion  . TUBAL LIGATION      There were no vitals filed for this visit.  Subjective Assessment - 11/13/19 0915    Subjective  Patient reports her right hamstring is bothering her today. She feels the weather is causing this to be aggravated today.    Patient Stated Goals  Get rid of pain so she can walk and move better    Currently in Pain?  Yes    Pain Score  8     Pain Location  Leg    Pain Orientation  Right    Pain Descriptors / Indicators  Tightness   "pulling and a pain"   Pain Type  Chronic pain    Pain Radiating Towards  right posterior thigh    Pain Onset  More than a month ago    Pain Frequency  Constant  [place] Adult PT Treatment/Exercise - 11/13/19 0001      Exercises   Exercises  Knee/Hip      Lumbar Exercises: Stretches   Single Knee to Chest Stretch  2 reps;20 seconds    Lower Trunk Rotation Limitations  5 sec hold x10 each    Piriformis Stretch  2 reps;20 seconds      Knee/Hip Exercises: Aerobic   Nustep  L4 x 6 min (UE and LE)      Knee/Hip Exercises: Seated   Sit to Sand  2 sets;5 reps      Knee/Hip Exercises: Supine   Bridges  2 sets;10 reps    Bridges Limitations  added ball squeeze with 2nd set    Straight Leg Raises  2 sets;10 reps      Manual Therapy   Manual Therapy  Soft tissue mobilization;Joint mobilization    Joint Mobilization  LAD to RLE with gentle oscillitions, LAD combined with SLR    Soft tissue mobilization  Right hamstring with roller             PT Education - 11/13/19 0918    Education Details  HEP    Person(s) Educated  Patient     Methods  Explanation;Demonstration;Verbal cues;Tactile cues    Comprehension  Verbalized understanding;Returned demonstration;Verbal cues required;Tactile cues required;Need further instruction       PT Short Term Goals - 11/13/19 0921      PT SHORT TERM GOAL #1   Title  Patient will be I with initial HEP to progress with PT    Time  4    Period  Weeks    Status  On-going    Target Date  11/13/19      PT SHORT TERM GOAL #2   Title  Patient will be able to walk >/= 10 minutes with </= 5/10 to improve community access    Baseline  Patient reports she can walk about 20-25 minutes but pain level remains > 5/10    Time  4    Period  Weeks    Status  On-going    Target Date  11/13/19      PT SHORT TERM GOAL #3   Title  Patient will be able to negotiate stairs without limitation    Baseline  Patient reports no trouble with stairs as long as she goes slow    Time  4    Period  Weeks    Status  Achieved        PT Long Term Goals - 10/16/19 1153      PT LONG TERM GOAL #1   Title  Patient will be I with final HEP to maintain progress from PT    Time  8    Period  Weeks    Status  New    Target Date  12/11/19      PT LONG TERM GOAL #2   Title  Patient will exhibit improved strength of core and gluteals >/= 4/5 MMT to be able to perform heavy activities around home with only a little bit of difficulty    Time  8    Period  Weeks    Status  New    Target Date  12/11/19      PT LONG TERM GOAL #3   Title  Patient will be able to sit and walk without deviation >/= 20 minutes with report </= 3/10 pain level to improve community access and negotaition    Time  8    Period  Weeks    Status  New    Target Date  12/11/19      PT LONG TERM GOAL #4   Title  Patient will report improved functional level to </= 38% limitation on FOTO    Time  8    Period  Weeks    Status  New    Target Date  12/11/19            Plan - 11/13/19 0920    Clinical Impression Statement   Patient tolerated therapy well with no adverse effects. She arrived reporting increased pain so used manual and stretching to improve symptoms. Patient did report improved posterior right thigh pain and walking ability following therapy. Continued core strengthening this visit with good tolerance and added sit<>stand to work on strengthening and mobility. She would benefit from continued skilled PT to improve mobility and strength and reduce pain with activity.    PT Treatment/Interventions  ADLs/Self Care Home Management;Cryotherapy;Electrical Stimulation;Moist Heat;Gait training;Stair training;Functional mobility training;Therapeutic activities;Therapeutic exercise;Balance training;Neuromuscular re-education;Patient/family education;Manual techniques;Dry needling;Passive range of motion;Taping;Spinal Manipulations;Joint Manipulations    PT Next Visit Plan  Assess HEP and progress PRN, NuStep, manual and modalities for right hip PRN, right hip and hamstring stretching, light core and gluteal strengthening    PT Home Exercise Plan  [id number]: supine single knee to chest stretch, supine piriformis stretch, hooklying hamstring stretch with strap, LTR, supine marching, supine clam blue band, SLR    Consulted and Agree with Plan of Care  Patient       Patient will benefit from skilled therapeutic intervention in order to improve the following deficits and impairments:  Abnormal gait, Decreased range of motion, Difficulty walking, Decreased endurance, Decreased activity tolerance, Pain, Impaired flexibility, Decreased strength, Postural dysfunction  Visit Diagnosis: Sciatica, right side  Pain in right hip  Muscle weakness (generalized)  Other abnormalities of gait and mobility     Problem List Patient Active Problem List   Diagnosis Date Noted  . Vitamin D deficiency 09/10/2019  . CHF (congestive heart failure) ([place]) 01/30/2018  . OSA (obstructive sleep apnea) 03/01/2017  . ARF (acute renal  failure) ([place]) 02/28/2017  . Hyponatremia 02/28/2017  . Syncope 07/23/2016  . Syncope and collapse 07/23/2016  . S/P cardiac catheterization, non obstructive disease 06/04/15 06/05/2015  . CAD in native artery 06/05/2015  . Fever 06/05/2015  . Hypokalemia 06/05/2015  . Metabolic syndrome [id number]  . Chest pain, neg MI, possible GI 06/04/2015  . Colon cancer screening 08/05/2014  . Severe obesity (BMI >= 40) ([place]) 08/05/2014  . Health care maintenance 05/13/2014  . Prediabetes 05/13/2014  . Chronic migraine 04/27/2014  . Atypical chest pain 04/27/2014  . SOB (shortness of breath) 09/16/2013  . Palpitations 09/16/2013  . HTN (hypertension) 03/09/2012  . Hyperlipidemia 03/09/2012  . Microcytic anemia   . Morbid obesity with BMI of 50.0-59.9, adult ([place])   . Precordial chest pain 03/06/2012  . Dizziness 03/06/2012    Hilda Blades, PT, DPT, LAT, ATC 11/13/19  9:55 AM Phone: [phone] Fax: [facility] [address] [place], [place], [address] Phone: [phone]   Fax:  [phone]  Name: LOLITA LECKER MRN: [id number] Date of Birth: 1960-08-11

## 2019-11-20 ENCOUNTER — Other Ambulatory Visit: Payer: Self-pay

## 2019-11-20 ENCOUNTER — Encounter: Payer: Self-pay | Admitting: Physical Therapy

## 2019-11-20 ENCOUNTER — Ambulatory Visit: Payer: Medicare HMO | Attending: Nurse Practitioner | Admitting: Physical Therapy

## 2019-11-20 DIAGNOSIS — M25551 Pain in right hip: Secondary | ICD-10-CM | POA: Diagnosis not present

## 2019-11-20 DIAGNOSIS — M5431 Sciatica, right side: Secondary | ICD-10-CM | POA: Insufficient documentation

## 2019-11-20 DIAGNOSIS — R2689 Other abnormalities of gait and mobility: Secondary | ICD-10-CM | POA: Diagnosis not present

## 2019-11-20 DIAGNOSIS — M6281 Muscle weakness (generalized): Secondary | ICD-10-CM | POA: Diagnosis not present

## 2019-11-20 NOTE — Therapy (Signed)
[place], [place], [address] Phone: [phone]   Fax:  [phone]  Physical Therapy Treatment  Patient Details  Name: Hannah Huerta MRN: [id number] Date of Birth: 02-15-1961 Referring Provider (PT): Vevelyn Francois, NP   Encounter Date: 11/20/2019  PT End of Session - 11/20/19 1005    Visit Number  5    Number of Visits  8    Date for PT Re-Evaluation  12/11/19    Authorization Type  [employer] MCR    Authorization Time Period  10/16/2019 - 12/11/2019    Authorization - Visit Number  5    Authorization - Number of Visits  8    PT Start Time  1001    PT Stop Time  1042    PT Time Calculation (min)  41 min    Activity Tolerance  Patient tolerated treatment well    Behavior During Therapy  [facility] for tasks assessed/performed       Past Medical History:  Diagnosis Date  . Abnormal Pap smear of cervix    pt couldnt state what type of abnormality  . Anemia   . CAD in native artery 06/05/2015  . Chest pain    a. 02/2012 abnl myoview - inferoapical reverisbility concerning for ischemia, EF 59%;   02/2012 nonobstructive cath  . CHF (congestive heart failure) ([place])   . Chronic migraine   . GERD (gastroesophageal reflux disease)   . HLD (hyperlipidemia)   . Hypertension   . Hypokalemia 06/05/2015  . Metabolic syndrome [id number]  . Morbid obesity with BMI of 50.0-59.9, adult ([place])   . Prediabetes   . S/P cardiac catheterization, non obstructive disease 06/04/15 06/05/2015  . Sickle cell trait ([place])   . Syncope     Past Surgical History:  Procedure Laterality Date  . BACK SURGERY     x1 lumbar fusion  . CARDIAC CATHETERIZATION N/A 06/04/2015   Procedure: Left Heart Cath and Coronary Angiography;  Surgeon: [name], MD;  Location: [facility];  Service: Cardiovascular;  Laterality: N/A;  . CHOLECYSTECTOMY     laparoscopic  . COLONOSCOPY WITH PROPOFOL N/A 09/04/2014   Procedure: COLONOSCOPY WITH  PROPOFOL;  Surgeon: [name], MD;  [facility];  Service: Endoscopy;  Laterality: N/A;  . LEFT HEART CATHETERIZATION WITH CORONARY ANGIOGRAM N/A 03/08/2012   Procedure: LEFT HEART CATHETERIZATION WITH CORONARY ANGIOGRAM;  Surgeon: Hillary Bow, MD;  Location: [facility];  Service: Cardiovascular;  Laterality: N/A;  . SPINE SURGERY     fusion  . TUBAL LIGATION      There were no vitals filed for this visit.  Subjective Assessment - 11/20/19 1004    Subjective  Patient reports she continues to have good days and bad days, bad days are still consistent when it rains. She does feel better today.    Patient Stated Goals  Get rid of pain so she can walk and move better    Currently in Pain?  Yes    Pain Score  6     Pain Location  Leg    Pain Orientation  Right    Pain Descriptors / Indicators  Sore    Pain Type  Chronic pain    Pain Radiating Towards  right posterior thigh    Pain Onset  More than a month ago    Pain Frequency  Constant  [place] Adult PT Treatment/Exercise - 11/20/19 0001      Exercises   Exercises  Knee/Hip      Lumbar Exercises: Stretches   Single Knee to Chest Stretch  2 reps;20 seconds    Lower Trunk Rotation Limitations  5 sec hold x10 each    Piriformis Stretch  2 reps;20 seconds      Knee/Hip Exercises: Aerobic   Nustep  L4 x 6 min (UE and LE)      Knee/Hip Exercises: Seated   Sit to Sand  2 sets;10 reps      Knee/Hip Exercises: Supine   Bridges with Ball Squeeze  2 sets;10 reps    Straight Leg Raises  2 sets;10 reps      Knee/Hip Exercises: Sidelying   Hip ABduction  2 sets;10 reps      Manual Therapy   Manual Therapy  Joint mobilization    Joint Mobilization  LAD to RLE with gentle oscillitions, LAD combined with SLR             PT Education - 11/20/19 1005    Education Details  HEP    Person(s) Educated  Patient    Methods  Explanation;Demonstration;Tactile cues;Verbal cues     Comprehension  Verbalized understanding;Returned demonstration;Verbal cues required;Tactile cues required;Need further instruction       PT Short Term Goals - 11/13/19 0921      PT SHORT TERM GOAL #1   Title  Patient will be I with initial HEP to progress with PT    Time  4    Period  Weeks    Status  On-going    Target Date  11/13/19      PT SHORT TERM GOAL #2   Title  Patient will be able to walk >/= 10 minutes with </= 5/10 to improve community access    Baseline  Patient reports she can walk about 20-25 minutes but pain level remains > 5/10    Time  4    Period  Weeks    Status  On-going    Target Date  11/13/19      PT SHORT TERM GOAL #3   Title  Patient will be able to negotiate stairs without limitation    Baseline  Patient reports no trouble with stairs as long as she goes slow    Time  4    Period  Weeks    Status  Achieved        PT Long Term Goals - 10/16/19 1153      PT LONG TERM GOAL #1   Title  Patient will be I with final HEP to maintain progress from PT    Time  8    Period  Weeks    Status  New    Target Date  12/11/19      PT LONG TERM GOAL #2   Title  Patient will exhibit improved strength of core and gluteals >/= 4/5 MMT to be able to perform heavy activities around home with only a little bit of difficulty    Time  8    Period  Weeks    Status  New    Target Date  12/11/19      PT LONG TERM GOAL #3   Title  Patient will be able to sit and walk without deviation >/= 20 minutes with report </= 3/10 pain level to improve community access and negotaition    Time  8    Period  Weeks    Status  New    Target Date  12/11/19      PT LONG TERM GOAL #4   Title  Patient will report improved functional level to </= 38% limitation on FOTO    Time  8    Period  Weeks    Status  New    Target Date  12/11/19            Plan - 11/20/19 1006    Clinical Impression Statement  Patient tolerated therapy well with no adverse effects. She continues  to report benefit from manual therapy with reduction in right posterior thigh pain. She tolerated progression in hip strengthening this visit, continues to exhibit greater difficultyon right side She would benefit from continued skilled PT to improve mobility and strength and reduce pain with activity and walking.    PT Treatment/Interventions  ADLs/Self Care Home Management;Cryotherapy;Electrical Stimulation;Moist Heat;Gait training;Stair training;Functional mobility training;Therapeutic activities;Therapeutic exercise;Balance training;Neuromuscular re-education;Patient/family education;Manual techniques;Dry needling;Passive range of motion;Taping;Spinal Manipulations;Joint Manipulations    PT Next Visit Plan  Assess HEP and progress PRN, NuStep, manual and modalities for right hip PRN, right hip and hamstring stretching, light core and gluteal strengthening    PT Home Exercise Plan  [id number]: supine single knee to chest stretch, supine piriformis stretch, hooklying hamstring stretch with strap, LTR, supine clam blue band, SLR, sideling hip abduction, sit<>stand    Consulted and Agree with Plan of Care  Patient       Patient will benefit from skilled therapeutic intervention in order to improve the following deficits and impairments:  Abnormal gait, Decreased range of motion, Difficulty walking, Decreased endurance, Decreased activity tolerance, Pain, Impaired flexibility, Decreased strength, Postural dysfunction  Visit Diagnosis: Sciatica, right side  Pain in right hip  Muscle weakness (generalized)  Other abnormalities of gait and mobility     Problem List Patient Active Problem List   Diagnosis Date Noted  . Vitamin D deficiency 09/10/2019  . CHF (congestive heart failure) ([place]) 01/30/2018  . OSA (obstructive sleep apnea) 03/01/2017  . ARF (acute renal failure) ([place]) 02/28/2017  . Hyponatremia 02/28/2017  . Syncope 07/23/2016  . Syncope and collapse 07/23/2016  . S/P cardiac  catheterization, non obstructive disease 06/04/15 06/05/2015  . CAD in native artery 06/05/2015  . Fever 06/05/2015  . Hypokalemia 06/05/2015  . Metabolic syndrome [id number]  . Chest pain, neg MI, possible GI 06/04/2015  . Colon cancer screening 08/05/2014  . Severe obesity (BMI >= 40) ([place]) 08/05/2014  . Health care maintenance 05/13/2014  . Prediabetes 05/13/2014  . Chronic migraine 04/27/2014  . Atypical chest pain 04/27/2014  . SOB (shortness of breath) 09/16/2013  . Palpitations 09/16/2013  . HTN (hypertension) 03/09/2012  . Hyperlipidemia 03/09/2012  . Microcytic anemia   . Morbid obesity with BMI of 50.0-59.9, adult ([place])   . Precordial chest pain 03/06/2012  . Dizziness 03/06/2012    Hilda Blades, PT, DPT, LAT, ATC 11/20/19  10:47 AM Phone: [phone] Fax: [facility] [address] [place], [place], [address] Phone: [phone]   Fax:  [phone]  Name: Hannah Huerta MRN: [id number] Date of Birth: Nov 22, 1960

## 2019-11-20 NOTE — Patient Instructions (Signed)
Access Code: [email or web address] URL: [email or web address] Date: 11/06/2019 Prepared by: Hilda Blades  Exercises Hooklying Single Knee to Chest Stretch - 1 x daily - 7 x weekly - 3 reps - 20 seconds hold Supine Piriformis Stretch with Foot on Ground - 1 x daily - 7 x weekly - 3 reps - 20 seconds hold Hooklying Hamstring Stretch with Strap - 1 x daily - 7 x weekly - 3 sets - 3 reps - 20 seconds hold Supine Lower Trunk Rotation - 1 x daily - 7 x weekly - 10 reps - 5 seconds hold Bridge - 1 x daily - 7 x weekly - 10 reps - 3 seconds hold Hooklying Clamshell with Resistance - 1 x daily - 7 x weekly - 2 sets - 10 reps Supine Active Straight Leg Raise - 1 x daily - 7 x weekly - 2 sets - 10 reps Sidelying Hip Abduction - 1 x daily - 7 x weekly - 2 sets - 10 reps Sit to Stand - 1 x daily - 7 x weekly - 2 sets - 10 reps

## 2019-11-27 ENCOUNTER — Encounter (HOSPITAL_COMMUNITY): Payer: Self-pay | Admitting: Emergency Medicine

## 2019-11-27 ENCOUNTER — Emergency Department (HOSPITAL_COMMUNITY)
Admission: EM | Admit: 2019-11-27 | Discharge: 2019-11-27 | Disposition: A | Discharge status: Home / Self Care | Payer: Medicare HMO | Attending: Emergency Medicine | Admitting: Emergency Medicine

## 2019-11-27 ENCOUNTER — Emergency Department (HOSPITAL_COMMUNITY): Payer: Medicare HMO

## 2019-11-27 ENCOUNTER — Other Ambulatory Visit: Payer: Self-pay

## 2019-11-27 ENCOUNTER — Ambulatory Visit: Payer: Medicare HMO | Admitting: Physical Therapy

## 2019-11-27 DIAGNOSIS — M25561 Pain in right knee: Secondary | ICD-10-CM

## 2019-11-27 DIAGNOSIS — Z7982 Long term (current) use of aspirin: Secondary | ICD-10-CM | POA: Diagnosis not present

## 2019-11-27 DIAGNOSIS — I2510 Atherosclerotic heart disease of native coronary artery without angina pectoris: Secondary | ICD-10-CM | POA: Insufficient documentation

## 2019-11-27 DIAGNOSIS — I110 Hypertensive heart disease with heart failure: Secondary | ICD-10-CM | POA: Insufficient documentation

## 2019-11-27 DIAGNOSIS — I509 Heart failure, unspecified: Secondary | ICD-10-CM | POA: Diagnosis not present

## 2019-11-27 DIAGNOSIS — Z79899 Other long term (current) drug therapy: Secondary | ICD-10-CM | POA: Diagnosis not present

## 2019-11-27 DIAGNOSIS — M1711 Unilateral primary osteoarthritis, right knee: Secondary | ICD-10-CM | POA: Diagnosis not present

## 2019-11-27 HISTORY — DX: Type 2 diabetes mellitus without complications: E11.9

## 2019-11-27 MED ORDER — LIDOCAINE 5 % EX PTCH
1.0000 | MEDICATED_PATCH | CUTANEOUS | Status: DC
  Administered 2019-11-27: 1 via TRANSDERMAL
  Filled 2019-11-27: qty 1

## 2019-11-27 MED ORDER — NAPROXEN 250 MG PO TABS
375.0000 mg | ORAL_TABLET | Freq: Once | ORAL | Status: AC
  Administered 2019-11-27: 375 mg via ORAL
  Filled 2019-11-27: qty 2

## 2019-11-27 NOTE — ED Provider Notes (Signed)
[facility] EMERGENCY DEPARTMENT Provider Note   CSN: [id number] Arrival date & time: 11/27/19  [id number]     History Chief Complaint  Patient presents with  . Knee Pain    Hannah Huerta is a 59 y.o. female.  Hannah Huerta is a 59 y.o. female with a history of CAD, CHF, diabetes, hyperlipidemia, hypertension, morbid obesity, sickle cell trait, who presents to the ED for evaluation of right knee pain.  She reports that she is currently being treated for right-sided sciatica and has had some issues with pain in her right leg but last night she went to get something out of her car, sat down in her car and when she went to get out of the car again she noticed a severe pain on the lateral aspect of her right knee, she states it felt like her knee was locking up and like she could not get out of the car.  She had to hold onto the car to stabilize herself and her son was able to help her get up and walk back into the house.  She states she took some Tylenol with mild improvement last night, but reports throughout the night she continued to have pain in the right knee.  She reports pain is primarily over the lateral joint line.  It is worse with movement and weightbearing.  She has not noticed swelling in the knee or lower extremity.  Denies pain in the posterior aspect of the knee or calf.  Reports no weakness or numbness.  No worsening of her low back pain for which she is currently doing physical therapy to help with right-sided sciatica.  She took another dose of Tylenol this morning, reports continued pain.  Had to walk with a cane this morning.  Has not tried anything else to treat symptoms.  Denies prior history of similar knee problems, no previous surgeries.  No other aggravating or alleviating factors.        Past Medical History:  Diagnosis Date  . Abnormal Pap smear of cervix    pt couldnt state what type of abnormality  . Anemia   . CAD in native artery 06/05/2015  .  Chest pain    a. 02/2012 abnl myoview - inferoapical reverisbility concerning for ischemia, EF 59%;   02/2012 nonobstructive cath  . CHF (congestive heart failure) ([place])   . Chronic migraine   . Diabetes mellitus without complication ([place])   . GERD (gastroesophageal reflux disease)   . HLD (hyperlipidemia)   . Hypertension   . Hypokalemia 06/05/2015  . Metabolic syndrome [id number]  . Morbid obesity with BMI of 50.0-59.9, adult ([place])   . Prediabetes   . S/P cardiac catheterization, non obstructive disease 06/04/15 06/05/2015  . Sickle cell trait ([place])   . Syncope     Patient Active Problem List   Diagnosis Date Noted  . Vitamin D deficiency 09/10/2019  . CHF (congestive heart failure) ([place]) 01/30/2018  . OSA (obstructive sleep apnea) 03/01/2017  . ARF (acute renal failure) ([place]) 02/28/2017  . Hyponatremia 02/28/2017  . Syncope 07/23/2016  . Syncope and collapse 07/23/2016  . S/P cardiac catheterization, non obstructive disease 06/04/15 06/05/2015  . CAD in native artery 06/05/2015  . Fever 06/05/2015  . Hypokalemia 06/05/2015  . Metabolic syndrome [id number]  . Chest pain, neg MI, possible GI 06/04/2015  . Colon cancer screening 08/05/2014  . Severe obesity (BMI >= 40) ([place]) 08/05/2014  . Health care maintenance 05/13/2014  . Prediabetes  05/13/2014  . Chronic migraine 04/27/2014  . Atypical chest pain 04/27/2014  . SOB (shortness of breath) 09/16/2013  . Palpitations 09/16/2013  . HTN (hypertension) 03/09/2012  . Hyperlipidemia 03/09/2012  . Microcytic anemia   . Morbid obesity with BMI of 50.0-59.9, adult ([place])   . Precordial chest pain 03/06/2012  . Dizziness 03/06/2012    Past Surgical History:  Procedure Laterality Date  . BACK SURGERY     x1 lumbar fusion  . CARDIAC CATHETERIZATION N/A 06/04/2015   Procedure: Left Heart Cath and Coronary Angiography;  Surgeon: [name], MD;  Location: [facility];  Service: Cardiovascular;  Laterality: N/A;  .  CHOLECYSTECTOMY     laparoscopic  . COLONOSCOPY WITH PROPOFOL N/A 09/04/2014   Procedure: COLONOSCOPY WITH PROPOFOL;  Surgeon: [name], MD;  Location: [facility];  Service: Endoscopy;  Laterality: N/A;  . LEFT HEART CATHETERIZATION WITH CORONARY ANGIOGRAM N/A 03/08/2012   Procedure: LEFT HEART CATHETERIZATION WITH CORONARY ANGIOGRAM;  Surgeon: Hillary Bow, MD;  Location: [facility] CATH LAB;  Service: Cardiovascular;  Laterality: N/A;  . SPINE SURGERY     fusion  . TUBAL LIGATION       OB History    Gravida  5   Para  3   Term      Preterm      AB  2   Living  3     SAB      TAB  2   Ectopic      Multiple      Live Births              Family History  Problem Relation Age of Onset  . Coronary artery disease Father 34  . Heart disease Father   . Other Father        died in a MVA  . Hypertension Father   . Sudden death Mother 62       Questionable MI  . Diabetes Mother   . Hypertension Mother   . Hypertension Sister   . Pancreatic cancer Maternal Aunt   . Breast cancer Maternal Aunt        after age 79  . Stroke Paternal Grandmother   . Colon polyps Sister   . Colon cancer Neg Hx   . Gallbladder disease Neg Hx   . Esophageal cancer Neg Hx     Social History   Tobacco Use  . Smoking status: Never Smoker  . Smokeless tobacco: Never Used  Substance Use Topics  . Alcohol use: No  . Drug use: No    Home Medications Prior to Admission medications   Medication Sig Start Date End Date Taking? Authorizing Provider  amLODipine (NORVASC) 5 MG tablet Take 1 tablet (5 mg total) by mouth daily. 09/11/19   Vevelyn Francois, NP  aspirin EC 81 MG tablet Take 1 tablet (81 mg total) by mouth daily. 03/07/17   Dorena Dew, FNP  atorvastatin (LIPITOR) 40 MG tablet Take 1 tablet (40 mg total) by mouth daily at 6 PM. 10/09/19   Vevelyn Francois, NP  carvedilol (COREG) 6.25 MG tablet TAKE 1 TABLET BY MOUTH 2 TIMES DAILY WITH A MEAL. 10/09/19   Vevelyn Francois,  NP  cyclobenzaprine (FLEXERIL) 10 MG tablet Take 1 tablet (10 mg total) by mouth at bedtime. 10/23/19   Lawyer, Harrell Gave, PA-C  ergocalciferol (VITAMIN D2) 1.25 MG (50000 UT) capsule Take 1 capsule (50,000 Units total) by mouth once a week. X  12 weeks. 09/10/19 12/09/19  Vevelyn Francois, NP  hydrocortisone (ANUSOL-HC) 25 MG suppository Place 1 suppository (25 mg total) rectally 2 (two) times daily as needed for hemorrhoids or anal itching. Patient not taking: Reported on 09/08/2019 02/05/19   Petrucelli, Aldona Bar R, PA-C  metFORMIN (GLUCOPHAGE) 500 MG tablet Take 1 tablet (500 mg total) by mouth daily with breakfast. 09/11/19   Vevelyn Francois, NP  polyethylene glycol (MIRALAX) 17 g packet Take 17 g by mouth daily as needed for moderate constipation. Patient not taking: Reported on 09/08/2019 02/05/19   Petrucelli, Glynda Jaeger, PA-C  predniSONE (DELTASONE) 50 MG tablet Take 1 tablet (50 mg total) by mouth daily with breakfast. 10/23/19   Lawyer, Harrell Gave, PA-C  spironolactone (ALDACTONE) 25 MG tablet Take 1 tablet (25 mg total) by mouth daily. 10/09/19 11/08/19  Vevelyn Francois, NP  traMADol (ULTRAM) 50 MG tablet Take 1 tablet (50 mg total) by mouth every 6 (six) hours as needed for severe pain. 10/23/19   Lawyer, Harrell Gave, PA-C    Allergies    Patient has no known allergies.  Review of Systems   Review of Systems  Constitutional: Negative for chills and fever.  Respiratory: Negative for shortness of breath.   Cardiovascular: Negative for chest pain and leg swelling.  Musculoskeletal: Positive for arthralgias. Negative for joint swelling.  Skin: Negative for color change and rash.  Neurological: Negative for weakness and numbness.  All other systems reviewed and are negative.   Physical Exam Updated Vital Signs BP (!) 150/86 (BP Location: Left Arm)   Pulse 86   Temp 98.1 F (36.7 C) (Oral)   Resp (!) 23   Ht 5\' 1"  (1.549 m)   Wt (!) 140 kg   LMP 09/15/2013   SpO2 96%   BMI 58.32 kg/m     Physical Exam Vitals and nursing note reviewed.  Constitutional:      General: She is not in acute distress.    Appearance: Normal appearance. She is well-developed. She is obese. She is not ill-appearing or diaphoretic.     Comments: Obese female, in no acute distress  HENT:     Head: Normocephalic and atraumatic.  Eyes:     General:        Right eye: No discharge.        Left eye: No discharge.  Pulmonary:     Effort: Pulmonary effort is normal. No respiratory distress.  Musculoskeletal:        General: Tenderness present.     Comments: Tenderness present over the lateral right knee at the joint line, pain is reproducible with palpation and is worse with range of motion.  No focal tenderness over the posterior knee or calf.  No palpable deformity or effusion.  Distal pulses 2+, able to range knee greater than 90 degrees with some discomfort, sensation intact.  Skin:    General: Skin is warm and dry.     Capillary Refill: Capillary refill takes less than 2 seconds.  Neurological:     Mental Status: She is alert and oriented to person, place, and time.     Coordination: Coordination normal.  Psychiatric:        Mood and Affect: Mood normal.        Behavior: Behavior normal.     ED Results / Procedures / Treatments   Labs (all labs ordered are listed, but only abnormal results are displayed) Labs Reviewed - No data to display  EKG None  Radiology DG Knee  Complete 4 Views Right  Result Date: 11/27/2019 CLINICAL DATA:  Knee pain EXAM: RIGHT KNEE - COMPLETE 4+ VIEW COMPARISON:  None. FINDINGS: No evidence of fracture, dislocation, or joint effusion. Osteoarthritis with bulky generalized spurring. Especially based on the lateral view there is likely generalized joint space narrowing. IMPRESSION: 1. Advanced and tricompartmental osteoarthritis. 2. No acute finding. Electronically Signed   By: Monte Fantasia M.D.   On: 11/27/2019 06:44    Procedures Procedures (including  critical care time)  Medications Ordered in ED Medications - No data to display  ED Course  I have reviewed the triage vital signs and the nursing notes.  Pertinent labs & imaging results that were available during my care of the patient were reviewed by me and considered in my medical decision making (see chart for details).    MDM Rules/Calculators/A&P                     59 year old presents with right knee pain that began last night when she was twisting to get in and out of her car.  No other trauma to the knee.  No previous injury but is currently being treated for right-sided sciatica.  No new numbness weakness or tingling in the right lower extremity.  On exam pain is well localized over the lateral joint line of the right knee she has no posterior knee or calf pain and no swelling and I have low suspicion for DVT.  Exam is not concerning for septic arthritis.  X-ray shows advanced tricompartmental osteoarthritis which I suspect is likely the cause of patient's pain, could also have meniscal or ligamentous inflammation.  We will continue to treat with Tylenol, anti-inflammatories, patient also provided with a lidocaine patch and knee sleeve today.  I have encouraged her to continue walking with a cane as needed until pain improves.  We will have her follow-up with orthopedics.  Return precautions discussed.  Patient expresses understanding and agreement with plan.  Discharged home in good condition.  Final Clinical Impression(s) / ED Diagnoses Final diagnoses:  Acute pain of right knee  Tricompartment osteoarthritis of right knee    Rx / DC Orders ED Discharge Orders    None       Janet Berlin 11/27/19 [place], [place], DO 11/29/19 1538

## 2019-11-27 NOTE — ED Triage Notes (Signed)
Patient reports right knee pain onset yesterday evening , denies injury , ambulatory , pain increases when bearing weight and movement .

## 2019-11-27 NOTE — Discharge Instructions (Signed)
Your x-ray shows severe arthritis in your right knee which I suspect is contributing to your pain.  Use over-the-counter salon pas lidocaine patches 12 hours on, 12 hours off over the knee to help with local pain relief.  Continue taking extra strength Tylenol every 8 hours, and use Naprosyn twice daily to help with pain and inflammation.  Use knee sleeve for support and walk with cane as needed.  Please call to schedule follow-up appointment with orthopedics.

## 2019-12-01 DIAGNOSIS — M1711 Unilateral primary osteoarthritis, right knee: Secondary | ICD-10-CM | POA: Diagnosis not present

## 2019-12-01 DIAGNOSIS — Z6841 Body Mass Index (BMI) 40.0 and over, adult: Secondary | ICD-10-CM | POA: Diagnosis not present

## 2019-12-04 ENCOUNTER — Encounter: Payer: Self-pay | Admitting: Physical Therapy

## 2019-12-04 ENCOUNTER — Other Ambulatory Visit: Payer: Self-pay

## 2019-12-04 ENCOUNTER — Ambulatory Visit: Payer: Medicare HMO | Admitting: Physical Therapy

## 2019-12-04 DIAGNOSIS — M25551 Pain in right hip: Secondary | ICD-10-CM | POA: Diagnosis not present

## 2019-12-04 DIAGNOSIS — M5431 Sciatica, right side: Secondary | ICD-10-CM

## 2019-12-04 DIAGNOSIS — R2689 Other abnormalities of gait and mobility: Secondary | ICD-10-CM

## 2019-12-04 DIAGNOSIS — M6281 Muscle weakness (generalized): Secondary | ICD-10-CM | POA: Diagnosis not present

## 2019-12-04 NOTE — Therapy (Signed)
[facility], [place], [address] Phone: [phone]   Fax:  [phone]  Physical Therapy Treatment  Patient Details  Name: Hannah Huerta MRN: [id number] Date of Birth: [birth year] Referring Provider (PT): Vevelyn Francois, NP   Encounter Date: 12/04/2019  PT End of Session - 12/04/19 0904    Visit Number  6    Number of Visits  8    Date for PT Re-Evaluation  12/11/19    Authorization Type  [employer] MCR    Authorization Time Period  10/16/2019 - 12/11/2019    Authorization - Visit Number  6    Authorization - Number of Visits  8    PT Start Time  0848    PT Stop Time  0930    PT Time Calculation (min)  42 min    Activity Tolerance  Patient tolerated treatment well    Behavior During Therapy  [facility] for tasks assessed/performed       Past Medical History:  Diagnosis Date  . Abnormal Pap smear of cervix    pt couldnt state what type of abnormality  . Anemia   . CAD in native artery 06/05/2015  . Chest pain    a. 02/2012 abnl myoview - inferoapical reverisbility concerning for ischemia, EF 59%;   02/2012 nonobstructive cath  . CHF (congestive heart failure) ([place])   . Chronic migraine   . Diabetes mellitus without complication ([place])   . GERD (gastroesophageal reflux disease)   . HLD (hyperlipidemia)   . Hypertension   . Hypokalemia 06/05/2015  . Metabolic syndrome XX123456  . Morbid obesity with BMI of 50.0-59.9, adult ([place])   . Prediabetes   . S/P cardiac catheterization, non obstructive disease 06/04/15 06/05/2015  . Sickle cell trait ([place])   . Syncope     Past Surgical History:  Procedure Laterality Date  . BACK SURGERY     x1 lumbar fusion  . CARDIAC CATHETERIZATION N/A 06/04/2015   Procedure: Left Heart Cath and Coronary Angiography;  Surgeon: [name], MD;  Location: [facility];  Service: Cardiovascular;  Laterality: N/A;  . CHOLECYSTECTOMY     laparoscopic  . COLONOSCOPY WITH  PROPOFOL N/A 09/04/2014   Procedure: COLONOSCOPY WITH PROPOFOL;  Surgeon: [name], MD;  Location: [facility];  Service: Endoscopy;  Laterality: N/A;  . LEFT HEART CATHETERIZATION WITH CORONARY ANGIOGRAM N/A 03/08/2012   Procedure: LEFT HEART CATHETERIZATION WITH CORONARY ANGIOGRAM;  Surgeon: Hillary Bow, MD;  Location: [facility] CATH LAB;  Service: Cardiovascular;  Laterality: N/A;  . SPINE SURGERY     fusion  . TUBAL LIGATION      There were no vitals filed for this visit.  Subjective Assessment - 12/04/19 0859    Subjective  Patient reports that she went to the emergency room last week and had to cancel her appointment. She states her right knee was significantly painful and she could barely walk on it. States that it feels better now.    Patient Stated Goals  Get rid of pain so she can walk and move better    Currently in Pain?  Yes    Pain Score  5     Pain Location  Leg    Pain Orientation  Right    Pain Descriptors / Indicators  --   "pulling feeling"   Pain Type  Chronic pain    Pain Radiating Towards  right posterior thigh    Pain Onset  More  than a month ago    Pain Frequency  Constant         [facility] PT Assessment - 12/04/19 0001      Strength   Right Ankle Dorsiflexion  2/5                    [facility] Adult PT Treatment/Exercise - 12/04/19 0001      Exercises   Exercises  Knee/Hip      Lumbar Exercises: Stretches   Passive Hamstring Stretch  2 reps;20 seconds    Single Knee to Chest Stretch  2 reps;20 seconds    Lower Trunk Rotation Limitations  5 sec hold x10 each    Hip Flexor Stretch  2 reps;20 seconds    Piriformis Stretch  2 reps;20 seconds      Knee/Hip Exercises: Aerobic   Nustep  L5 x 6 min (UE and LE)      Knee/Hip Exercises: Supine   Bridges with Ball Squeeze  2 sets;10 reps   3 sec hold   Straight Leg Raises  2 sets;10 reps    Other Supine Knee/Hip Exercises  Clamshell with blue band  2x10       Knee/Hip Exercises: Sidelying    Hip ABduction  2 sets;10 reps      Manual Therapy   Manual Therapy  Joint mobilization    Joint Mobilization  LAD to RLE with gentle oscillitions, LAD combined with SLR             PT Education - 12/04/19 0904    Education Details  HEP    Person(s) Educated  Patient    Methods  Explanation;Demonstration;Verbal cues    Comprehension  Verbalized understanding;Returned demonstration;Verbal cues required;Need further instruction       PT Short Term Goals - 11/13/19 0921      PT SHORT TERM GOAL #1   Title  Patient will be I with initial HEP to progress with PT    Time  4    Period  Weeks    Status  On-going    Target Date  11/13/19      PT SHORT TERM GOAL #2   Title  Patient will be able to walk >/= 10 minutes with </= 5/10 to improve community access    Baseline  Patient reports she can walk about 20-25 minutes but pain level remains > 5/10    Time  4    Period  Weeks    Status  On-going    Target Date  11/13/19      PT SHORT TERM GOAL #3   Title  Patient will be able to negotiate stairs without limitation    Baseline  Patient reports no trouble with stairs as long as she goes slow    Time  4    Period  Weeks    Status  Achieved        PT Long Term Goals - 10/16/19 1153      PT LONG TERM GOAL #1   Title  Patient will be I with final HEP to maintain progress from PT    Time  8    Period  Weeks    Status  New    Target Date  12/11/19      PT LONG TERM GOAL #2   Title  Patient will exhibit improved strength of core and gluteals >/= 4/5 MMT to be able to perform heavy activities around home with only a little bit of  difficulty    Time  8    Period  Weeks    Status  New    Target Date  12/11/19      PT LONG TERM GOAL #3   Title  Patient will be able to sit and walk without deviation >/= 20 minutes with report </= 3/10 pain level to improve community access and negotaition    Time  8    Period  Weeks    Status  New    Target Date  12/11/19      PT LONG  TERM GOAL #4   Title  Patient will report improved functional level to </= 38% limitation on FOTO    Time  8    Period  Weeks    Status  New    Target Date  12/11/19            Plan - 12/04/19 0906    Clinical Impression Statement  Patient tolerated therapy well with no adverse effects. She reports improvement in symptoms following manual therapy and stretching and tolerated exercise well. She continues to report right posterior thigh pain with weakness of the right side that does seem related to the lumbar spine. She is scheduled for MRI on 12/09/2019 to assess lumbar spine. She did not report any increase in right knee pain this visit. She would benefit from continued skilled PT to improve mobility and strength and reduce pain with activity and walking.    PT Treatment/Interventions  ADLs/Self Care Home Management;Cryotherapy;Electrical Stimulation;Moist Heat;Gait training;Stair training;Functional mobility training;Therapeutic activities;Therapeutic exercise;Balance training;Neuromuscular re-education;Patient/family education;Manual techniques;Dry needling;Passive range of motion;Taping;Spinal Manipulations;Joint Manipulations    PT Next Visit Plan  Assess HEP and progress PRN, NuStep, manual and modalities for right hip PRN, right hip and hamstring stretching, light core and gluteal strengthening    PT Home Exercise Plan  [id number]: supine single knee to chest stretch, supine piriformis stretch, hooklying hamstring stretch with strap, LTR, supine clam blue band, SLR, sideling hip abduction, sit<>stand    Consulted and Agree with Plan of Care  Patient       Patient will benefit from skilled therapeutic intervention in order to improve the following deficits and impairments:  Abnormal gait, Decreased range of motion, Difficulty walking, Decreased endurance, Decreased activity tolerance, Pain, Impaired flexibility, Decreased strength, Postural dysfunction  Visit Diagnosis: Sciatica, right  side  Pain in right hip  Muscle weakness (generalized)  Other abnormalities of gait and mobility     Problem List Patient Active Problem List   Diagnosis Date Noted  . Vitamin D deficiency 09/10/2019  . CHF (congestive heart failure) ([place]) 01/30/2018  . OSA (obstructive sleep apnea) 03/01/2017  . ARF (acute renal failure) ([place]) 02/28/2017  . Hyponatremia 02/28/2017  . Syncope 07/23/2016  . Syncope and collapse 07/23/2016  . S/P cardiac catheterization, non obstructive disease 06/04/15 06/05/2015  . CAD in native artery 06/05/2015  . Fever 06/05/2015  . Hypokalemia 06/05/2015  . Metabolic syndrome [id number]  . Chest pain, neg MI, possible GI 06/04/2015  . Colon cancer screening 08/05/2014  . Severe obesity (BMI >= 40) ([place]) 08/05/2014  . Health care maintenance 05/13/2014  . Prediabetes 05/13/2014  . Chronic migraine 04/27/2014  . Atypical chest pain 04/27/2014  . SOB (shortness of breath) 09/16/2013  . Palpitations 09/16/2013  . HTN (hypertension) 03/09/2012  . Hyperlipidemia 03/09/2012  . Microcytic anemia   . Morbid obesity with BMI of 50.0-59.9, adult ([place])   . Precordial chest pain 03/06/2012  . Dizziness 03/06/2012  Hilda Blades, PT, DPT, LAT, ATC 12/04/19  9:48 AM Phone: [phone] Fax: [facility] [address] [place], [place], [address] Phone: [phone]   Fax:  [phone]  Name: Hannah Huerta MRN: [id number] Date of Birth: 1961/05/11

## 2019-12-08 ENCOUNTER — Encounter: Payer: Self-pay | Admitting: Nurse Practitioner

## 2019-12-08 ENCOUNTER — Ambulatory Visit (INDEPENDENT_AMBULATORY_CARE_PROVIDER_SITE_OTHER): Payer: Medicare HMO | Admitting: Nurse Practitioner

## 2019-12-08 ENCOUNTER — Other Ambulatory Visit: Payer: Self-pay

## 2019-12-08 VITALS — BP 163/69 | HR 94 | Temp 99.0°F | Ht 61.0 in | Wt 283.0 lb

## 2019-12-08 DIAGNOSIS — R7303 Prediabetes: Secondary | ICD-10-CM

## 2019-12-08 DIAGNOSIS — E782 Mixed hyperlipidemia: Secondary | ICD-10-CM | POA: Diagnosis not present

## 2019-12-08 DIAGNOSIS — I10 Essential (primary) hypertension: Secondary | ICD-10-CM | POA: Diagnosis not present

## 2019-12-08 DIAGNOSIS — I509 Heart failure, unspecified: Secondary | ICD-10-CM

## 2019-12-08 DIAGNOSIS — Z6841 Body Mass Index (BMI) 40.0 and over, adult: Secondary | ICD-10-CM

## 2019-12-08 MED ORDER — DIAZEPAM 5 MG PO TABS: 5.0000 mg | ORAL_TABLET | ORAL | 0 refills | Status: DC | PRN

## 2019-12-08 NOTE — Patient Instructions (Signed)
Sciatica  Sciatica is pain, weakness, tingling, or loss of feeling (numbness) along the sciatic nerve. The sciatic nerve starts in the lower back and goes down the back of each leg. Sciatica usually goes away on its own or with treatment. Sometimes, sciatica may come back (recur). What are the causes? This condition happens when the sciatic nerve is pinched or has pressure put on it. This may be the result of:  A disk in between the bones of the spine bulging out too far (herniated disk).  Changes in the spinal disks that occur with aging.  A condition that affects a muscle in the butt.  Extra bone growth near the sciatic nerve.  A break (fracture) of the area between your hip bones (pelvis).  Pregnancy.  Tumor. This is rare. What increases the risk? You are more likely to develop this condition if you:  Play sports that put pressure or stress on the spine.  Have poor strength and ease of movement (flexibility).  Have had a back injury in the past.  Have had back surgery.  Sit for long periods of time.  Do activities that involve bending or lifting over and over again.  Are very overweight (obese). What are the signs or symptoms? Symptoms can vary from mild to very bad. They may include:  Any of these problems in the lower back, leg, hip, or butt: ? Mild tingling, loss of feeling, or dull aches. ? Burning sensations. ? Sharp pains.  Loss of feeling in the back of the calf or the sole of the foot.  Leg weakness.  Very bad back pain that makes it hard to move. These symptoms may get worse when you cough, sneeze, or laugh. They may also get worse when you sit or stand for long periods of time. How is this treated? This condition often gets better without any treatment. However, treatment may include:  Changing or cutting back on physical activity when you have pain.  Doing exercises and stretching.  Putting ice or heat on the affected area.  Medicines that  help: ? To relieve pain and swelling. ? To relax your muscles.  Shots (injections) of medicines that help to relieve pain, irritation, and swelling.  Surgery. Follow these instructions at home: Medicines  Take over-the-counter and prescription medicines only as told by your doctor.  Ask your doctor if the medicine prescribed to you: ? Requires you to avoid driving or using heavy machinery. ? Can cause trouble pooping (constipation). You may need to take these steps to prevent or treat trouble pooping:  Drink enough fluids to keep your pee (urine) pale yellow.  Take over-the-counter or prescription medicines.  Eat foods that are high in fiber. These include beans, whole grains, and fresh fruits and vegetables.  Limit foods that are high in fat and sugar. These include fried or sweet foods. Managing pain      If told, put ice on the affected area. ? Put ice in a plastic bag. ? Place a towel between your skin and the bag. ? Leave the ice on for 20 minutes, 2-3 times a day.  If told, put heat on the affected area. Use the heat source that your doctor tells you to use, such as a moist heat pack or a heating pad. ? Place a towel between your skin and the heat source. ? Leave the heat on for 20-30 minutes. ? Remove the heat if your skin turns bright red. This is very important if you are   unable to feel pain, heat, or cold. You may have a greater risk of getting burned. Activity   Return to your normal activities as told by your doctor. Ask your doctor what activities are safe for you.  Avoid activities that make your symptoms worse.  Take short rests during the day. ? When you rest for a long time, do some physical activity or stretching between periods of rest. ? Avoid sitting for a long time without moving. Get up and move around at least one time each hour.  Exercise and stretch regularly, as told by your doctor.  Do not lift anything that is heavier than 10 lb (4.5 kg)  while you have symptoms of sciatica. ? Avoid lifting heavy things even when you do not have symptoms. ? Avoid lifting heavy things over and over.  When you lift objects, always lift in a way that is safe for your body. To do this, you should: ? Bend your knees. ? Keep the object close to your body. ? Avoid twisting. General instructions  Stay at a healthy weight.  Wear comfortable shoes that support your feet. Avoid wearing high heels.  Avoid sleeping on a mattress that is too soft or too hard. You might have less pain if you sleep on a mattress that is firm enough to support your back.  Keep all follow-up visits as told by your doctor. This is important. Contact a doctor if:  You have pain that: ? Wakes you up when you are sleeping. ? Gets worse when you lie down. ? Is worse than the pain you have had in the past. ? Lasts longer than 4 weeks.  You lose weight without trying. Get help right away if:  You cannot control when you pee (urinate) or poop (have a bowel movement).  You have weakness in any of these areas and it gets worse: ? Lower back. ? The area between your hip bones. ? Butt. ? Legs.  You have redness or swelling of your back.  You have a burning feeling when you pee. Summary  Sciatica is pain, weakness, tingling, or loss of feeling (numbness) along the sciatic nerve.  This condition happens when the sciatic nerve is pinched or has pressure put on it.  Sciatica can cause pain, tingling, or loss of feeling (numbness) in the lower back, legs, hips, and butt.  Treatment often includes rest, exercise, medicines, and putting ice or heat on the affected area. This information is not intended to replace advice given to you by your health care provider. Make sure you discuss any questions you have with your health care provider. Document Revised: 07/22/2018 Document Reviewed: 07/22/2018 Elsevier Patient Education  2020 [employer]  

## 2019-12-08 NOTE — Progress Notes (Signed)
[name], [place]  [address] Phone:  [phone]   Fax:  [phone]   Established Patient Office Visit  Subjective:  Patient ID: Hannah Huerta, female    DOB: [birth year]  Age: 59 y.o. MRN: [id number]  CC:  Chief Complaint  Patient presents with  . Follow-up    siatic pain    HPI ILHAN TRETO presents for follow-up.  She is also having sciatic pain.  She was seen in the emergency room recently for right knee pain.  She was referred to [facility] for treatment. She  has a past medical history of Abnormal Pap smear of cervix, Anemia, CAD in native artery (06/05/2015), Chest pain, CHF (congestive heart failure) ([place]), Chronic migraine, Diabetes mellitus without complication ([place]), GERD (gastroesophageal reflux disease), HLD (hyperlipidemia), Hypertension, Hypokalemia ([id number]), Metabolic syndrome ([id number]), Morbid obesity with BMI of 50.0-59.9, adult ([place]), Prediabetes, S/P cardiac catheterization, non obstructive disease 06/04/15 (06/05/2015), Sickle cell trait ([place]), and Syncope.  She has seen Dr Arnoldo Morale in the past for low back pain. She is status post L4-5 decompression and fusion.  She is to have an MRI on tomorrow for reevaluation.  She admits that she is nervous and did not do well with her previous MRI without medication.  She is currently in physical therapy which she feels is somewhat effective.  She denies any numbness, tingling or weakness.  Hypertension Patient is here for follow-up of elevated blood pressure. She is not exercising however in physical therapy and is adherent to a low-salt diet.  She admits that she has maintained her weight.  Blood pressure is not monitored at home.  She needs a new blood pressure monitor.  Cardiac symptoms: none. Patient denies chest pain, dyspnea, irregular heart beat, lower extremity edema and palpitations. Cardiovascular risk factors: diabetes mellitus, dyslipidemia, obesity (BMI >= 30  kg/m2) and sedentary lifestyle. Use of agents associated with hypertension: none. History of target organ damage: heart failure.  Past Medical History:  Diagnosis Date  . Abnormal Pap smear of cervix    pt couldnt state what type of abnormality  . Anemia   . CAD in native artery 06/05/2015  . Chest pain    a. 02/2012 abnl myoview - inferoapical reverisbility concerning for ischemia, EF 59%;   02/2012 nonobstructive cath  . CHF (congestive heart failure) ([place])   . Chronic migraine   . Diabetes mellitus without complication ([place])   . GERD (gastroesophageal reflux disease)   . HLD (hyperlipidemia)   . Hypertension   . Hypokalemia 06/05/2015  . Metabolic syndrome [id number]  . Morbid obesity with BMI of 50.0-59.9, adult ([place])   . Prediabetes   . S/P cardiac catheterization, non obstructive disease 06/04/15 06/05/2015  . Sickle cell trait ([place])   . Syncope     Past Surgical History:  Procedure Laterality Date  . BACK SURGERY     x1 lumbar fusion  . CARDIAC CATHETERIZATION N/A 06/04/2015   Procedure: Left Heart Cath and Coronary Angiography;  Surgeon: [name], MD;  Location: [facility];  Service: Cardiovascular;  Laterality: N/A;  . CHOLECYSTECTOMY     laparoscopic  . COLONOSCOPY WITH PROPOFOL N/A 09/04/2014   Procedure: COLONOSCOPY WITH PROPOFOL;  Surgeon: [name], MD;  [facility];  Service: Endoscopy;  Laterality: N/A;  . LEFT HEART CATHETERIZATION WITH CORONARY ANGIOGRAM N/A 03/08/2012   Procedure: LEFT HEART CATHETERIZATION WITH CORONARY ANGIOGRAM;  Surgeon: Hillary Bow, MD;  Location:  [facility];  Service: Cardiovascular;  Laterality: N/A;  . SPINE SURGERY     fusion  . TUBAL LIGATION      Family History  Problem Relation Age of Onset  . Coronary artery disease Father 71  . Heart disease Father   . Other Father        died in a MVA  . Hypertension Father   . Sudden death Mother 68       Questionable MI  . Diabetes Mother   .  Hypertension Mother   . Hypertension Sister   . Pancreatic cancer Maternal Aunt   . Breast cancer Maternal Aunt        after age 16  . Stroke Paternal Grandmother   . Colon polyps Sister   . Colon cancer Neg Hx   . Gallbladder disease Neg Hx   . Esophageal cancer Neg Hx     Social History   Socioeconomic History  . Marital status: Widowed    Spouse name: Not on file  . Number of children: 3  . Years of education: Not on file  . Highest education level: Not on file  Occupational History  . Occupation: Works in the Estate manager/land agent: [employer] Use  . Smoking status: Never Smoker  . Smokeless tobacco: Never Used  Substance and Sexual Activity  . Alcohol use: No  . Drug use: No  . Sexual activity: Yes    Birth control/protection: Surgical  Other Topics Concern  . Not on file  Social History Narrative   Lives at home with husband and two of her sons.     Social Determinants of Health   Financial Resource Strain:   . Difficulty of Paying Living Expenses:   Food Insecurity:   . Worried About Charity fundraiser in the Last Year:   . Arboriculturist in the Last Year:   Transportation Needs:   . Film/video editor (Medical):   [name] Lack of Transportation (Non-Medical):   Physical Activity:   . Days of Exercise per Week:   . Minutes of Exercise per Session:   Stress:   . Feeling of Stress :   Social Connections:   . Frequency of Communication with Friends and Family:   . Frequency of Social Gatherings with Friends and Family:   . Attends Religious Services:   . Active Member of Clubs or Organizations:   . Attends Archivist Meetings:   [name] Marital Status:   Intimate Partner Violence:   . Fear of Current or Ex-Partner:   . Emotionally Abused:   [name] Physically Abused:   . Sexually Abused:     Outpatient Medications Prior to Visit  Medication Sig Dispense Refill  . amLODipine (NORVASC) 5 MG tablet Take 1 tablet (5 mg total) by mouth daily. 90  tablet 3  . aspirin EC 81 MG tablet Take 1 tablet (81 mg total) by mouth daily. 30 tablet 11  . atorvastatin (LIPITOR) 40 MG tablet Take 1 tablet (40 mg total) by mouth daily at 6 PM. 90 tablet 3  . carvedilol (COREG) 6.25 MG tablet TAKE 1 TABLET BY MOUTH 2 TIMES DAILY WITH A MEAL. 180 tablet 3  . cyclobenzaprine (FLEXERIL) 10 MG tablet Take 1 tablet (10 mg total) by mouth at bedtime. 10 tablet 0  . hydrocortisone (ANUSOL-HC) 25 MG suppository Place 1 suppository (25 mg total) rectally 2 (two) times daily as needed for hemorrhoids or anal itching. (Patient not  taking: Reported on 09/08/2019) 12 suppository 0  . metFORMIN (GLUCOPHAGE) 500 MG tablet Take 1 tablet (500 mg total) by mouth daily with breakfast. 90 tablet 3  . polyethylene glycol (MIRALAX) 17 g packet Take 17 g by mouth daily as needed for moderate constipation. (Patient not taking: Reported on 09/08/2019) 14 each 0  . predniSONE (DELTASONE) 50 MG tablet Take 1 tablet (50 mg total) by mouth daily with breakfast. 5 tablet 0  . spironolactone (ALDACTONE) 25 MG tablet Take 1 tablet (25 mg total) by mouth daily. 90 tablet 3  . traMADol (ULTRAM) 50 MG tablet Take 1 tablet (50 mg total) by mouth every 6 (six) hours as needed for severe pain. 15 tablet 0  . ergocalciferol (VITAMIN D2) 1.25 MG (50000 UT) capsule Take 1 capsule (50,000 Units total) by mouth once a week. X 12 weeks. 12 capsule 0   No facility-administered medications prior to visit.    No Known Allergies  ROS Review of Systems    Objective:    Physical Exam  BP (!) 163/69 (BP Location: Left Arm, Patient Position: Sitting, Cuff Size: Large)   Pulse 94   Temp 99 F (37.2 C) (Oral)   Ht 5\' 1"  (1.549 m)   Wt 283 lb (128.4 kg)   LMP 09/15/2013   SpO2 96%   BMI 53.47 kg/m  Wt Readings from Last 3 Encounters:  12/08/19 283 lb (128.4 kg)  11/27/19 (!) 308 lb 10.3 oz (140 kg)  09/08/19 284 lb (128.8 kg)     Health Maintenance Due  Topic Date Due  . COVID-19 Vaccine  (1) Never done  . MAMMOGRAM  05/19/2016  . PAP SMEAR-Modifier  11/20/2016  . URINE MICROALBUMIN  03/07/2018    There are no preventive care reminders to display for this patient.  Lab Results  Component Value Date   TSH 1.570 09/08/2019   Lab Results  Component Value Date   WBC 4.0 09/08/2019   HGB 11.8 09/08/2019   HCT 37.6 09/08/2019   MCV 81 09/08/2019   PLT 276 09/08/2019   Lab Results  Component Value Date   NA 140 09/08/2019   K 4.0 09/08/2019   CO2 29 02/05/2019   GLUCOSE 101 (H) 09/08/2019   BUN 9 09/08/2019   CREATININE 0.87 09/08/2019   BILITOT 0.3 09/08/2019   ALKPHOS 105 09/08/2019   AST 17 09/08/2019   ALT 13 02/05/2019   PROT 7.4 09/08/2019   ALBUMIN 4.1 09/08/2019   CALCIUM 9.8 09/08/2019   ANIONGAP 7 02/05/2019   Lab Results  Component Value Date   CHOL 131 09/08/2019   Lab Results  Component Value Date   HDL 52 09/08/2019   Lab Results  Component Value Date   LDLCALC 63 09/08/2019   Lab Results  Component Value Date   TRIG 83 09/08/2019   Lab Results  Component Value Date   CHOLHDL 2.5 09/08/2019   Lab Results  Component Value Date   HGBA1C 6.0 (A) 09/08/2019      Assessment & Plan:   Problem List Items Addressed This Visit      Unprioritized   CHF (congestive heart failure) ([place])   HTN (hypertension) - Primary   Relevant Orders   Comp. Metabolic Panel (12)   Hyperlipidemia   Relevant Orders   Lipid panel   Morbid obesity with BMI of 50.0-59.9, adult (HCC)   Prediabetes   Relevant Orders   POCT glycosylated hemoglobin (Hb A1C)   Hemoglobin A1c  Meds ordered this encounter  Medications  . diazepam (VALIUM) 5 MG tablet    Sig: Take 1 tablet (5 mg total) by mouth as needed for up to 2 doses for anxiety (Take one tab 45 minutes before MRI. Take second tablet just prior to MRI scan). Do not take medication within 4 hours of taking opioid pain medications. Must have a driver. Do not drive or operate machinery x 24  hours after taking this medication.    Dispense:  2 tablet    Refill:  0    Must have a driver. Do not drive or operate machinery x 24 hours after taking this medication.    Order Specific Question:   Supervising Provider    Answer:   Tresa Garter [id number]    Follow-up: Return in about 3 months (around 03/09/2020).    Vevelyn Francois, NP

## 2019-12-09 ENCOUNTER — Ambulatory Visit
Admission: RE | Admit: 2019-12-09 | Discharge: 2019-12-09 | Disposition: A | Discharge status: Home / Self Care | Payer: Medicare HMO | Source: Ambulatory Visit | Attending: Neurosurgery | Admitting: Neurosurgery

## 2019-12-09 DIAGNOSIS — M48061 Spinal stenosis, lumbar region without neurogenic claudication: Secondary | ICD-10-CM | POA: Diagnosis not present

## 2019-12-09 DIAGNOSIS — G8929 Other chronic pain: Secondary | ICD-10-CM

## 2019-12-09 LAB — LIPID PANEL
Chol/HDL Ratio: 2.8 ratio (ref 0.0–4.4)
Cholesterol, Total: 148 mg/dL (ref 100–199)
HDL: 53 mg/dL (ref >39)
LDL Chol Calc (NIH): 82 mg/dL (ref 0–99)
Triglycerides: 65 mg/dL (ref 0–149)
VLDL Cholesterol Cal: 13 mg/dL (ref 5–40)

## 2019-12-09 LAB — COMP. METABOLIC PANEL (12)
AST: 21 IU/L (ref 0–40)
Albumin/Globulin Ratio: 1.3 (ref 1.2–2.2)
Albumin: 4.2 g/dL (ref 3.8–4.9)
Alkaline Phosphatase: 104 IU/L (ref 48–121)
BUN/Creatinine Ratio: 17 (ref 9–23)
BUN: 15 mg/dL (ref 6–24)
Bilirubin Total: 0.3 mg/dL (ref 0.0–1.2)
Calcium: 9.7 mg/dL (ref 8.7–10.2)
Chloride: 103 mmol/L (ref 96–106)
Creatinine, Ser: 0.87 mg/dL (ref 0.57–1.00)
GFR calc Af Amer: 85 mL/min/{1.73_m2} (ref >59)
GFR calc non Af Amer: 74 mL/min/{1.73_m2} (ref >59)
Globulin, Total: 3.3 g/dL (ref 1.5–4.5)
Glucose: 107 mg/dL — ABNORMAL HIGH (ref 65–99)
Potassium: 4.3 mmol/L (ref 3.5–5.2)
Sodium: 140 mmol/L (ref 134–144)
Total Protein: 7.5 g/dL (ref 6.0–8.5)

## 2019-12-09 LAB — HEMOGLOBIN A1C
Est. average glucose Bld gHb Est-mCnc: 137 mg/dL
Hgb A1c MFr Bld: 6.4 % — ABNORMAL HIGH (ref 4.8–5.6)

## 2019-12-10 ENCOUNTER — Other Ambulatory Visit: Payer: Self-pay | Admitting: Nurse Practitioner

## 2019-12-10 ENCOUNTER — Other Ambulatory Visit: Payer: Self-pay

## 2019-12-10 ENCOUNTER — Telehealth: Payer: Self-pay | Admitting: Hematology

## 2019-12-10 DIAGNOSIS — R7303 Prediabetes: Secondary | ICD-10-CM

## 2019-12-10 DIAGNOSIS — I10 Essential (primary) hypertension: Secondary | ICD-10-CM

## 2019-12-10 MED ORDER — AMLODIPINE BESYLATE 5 MG PO TABS: 5.0000 mg | ORAL_TABLET | Freq: Every day | ORAL | 3 refills | Status: DC

## 2019-12-10 MED ORDER — METFORMIN HCL 500 MG PO TABS: 500.0000 mg | ORAL_TABLET | Freq: Two times a day (BID) | ORAL | 3 refills | Status: DC

## 2019-12-10 NOTE — Telephone Encounter (Signed)
-----   Message from Vevelyn Francois, NP sent at 12/10/2019 10:42 AM EDT ----- Please make Ms. Grand aware that her labs are stable.  Her A1c did go up slightly from 6.0-6.4.  A1c of 6.5 will move her from prediabetes to diabetes.  We can consider going up on the Metformin to 1 tablet twice a day to help keep her blood glucose under control.  This will be beneficial in the long run.  Please let me know if she would like for me to resend the new order.

## 2019-12-10 NOTE — Telephone Encounter (Signed)
Thank you new order to be sent.

## 2019-12-10 NOTE — Telephone Encounter (Signed)
Patient agreed to taking 1 tablet BID.  Pharmacy verified as [facility] on [address]. /

## 2019-12-11 ENCOUNTER — Encounter: Payer: Self-pay | Admitting: Physical Therapy

## 2019-12-11 ENCOUNTER — Other Ambulatory Visit: Payer: Self-pay

## 2019-12-11 ENCOUNTER — Ambulatory Visit: Payer: Medicare HMO | Admitting: Physical Therapy

## 2019-12-11 DIAGNOSIS — M25551 Pain in right hip: Secondary | ICD-10-CM | POA: Diagnosis not present

## 2019-12-11 DIAGNOSIS — M47816 Spondylosis without myelopathy or radiculopathy, lumbar region: Secondary | ICD-10-CM | POA: Insufficient documentation

## 2019-12-11 DIAGNOSIS — R2689 Other abnormalities of gait and mobility: Secondary | ICD-10-CM | POA: Diagnosis not present

## 2019-12-11 DIAGNOSIS — M6281 Muscle weakness (generalized): Secondary | ICD-10-CM

## 2019-12-11 DIAGNOSIS — M48062 Spinal stenosis, lumbar region with neurogenic claudication: Secondary | ICD-10-CM | POA: Diagnosis not present

## 2019-12-11 DIAGNOSIS — M5431 Sciatica, right side: Secondary | ICD-10-CM

## 2019-12-11 DIAGNOSIS — M4316 Spondylolisthesis, lumbar region: Secondary | ICD-10-CM | POA: Diagnosis not present

## 2019-12-11 NOTE — Therapy (Signed)
[place], [place], [address] Phone: [phone]   Fax:  [phone]  Physical Therapy Treatment  Progress Note Reporting Period 10/16/2019 to 12/11/2019  See note below for Objective Data and Assessment of Progress/Goals.    Patient Details  Name: Hannah Huerta MRN: [id number] Date of Birth: [birth year] Referring Provider (PT): Vevelyn Francois, NP   Encounter Date: 12/11/2019  PT End of Session - 12/11/19 0923    Visit Number  7    Number of Visits  16    Date for PT Re-Evaluation  01/22/20    Authorization Type  [employer] MCR    Authorization Time Period  --    Authorization - Visit Number  --    Authorization - Number of Visits  --    PT Start Time  0915    PT Stop Time  0957    PT Time Calculation (min)  42 min    Activity Tolerance  Patient tolerated treatment well    Behavior During Therapy  [facility] for tasks assessed/performed       Past Medical History:  Diagnosis Date  . Abnormal Pap smear of cervix    pt couldnt state what type of abnormality  . Anemia   . CAD in native artery 06/05/2015  . Chest pain    a. 02/2012 abnl myoview - inferoapical reverisbility concerning for ischemia, EF 59%;   02/2012 nonobstructive cath  . CHF (congestive heart failure) ([place])   . Chronic migraine   . Diabetes mellitus without complication ([place])   . GERD (gastroesophageal reflux disease)   . HLD (hyperlipidemia)   . Hypertension   . Hypokalemia 06/05/2015  . Metabolic syndrome [id number]  . Morbid obesity with BMI of 50.0-59.9, adult ([place])   . Prediabetes   . S/P cardiac catheterization, non obstructive disease 06/04/15 06/05/2015  . Sickle cell trait ([place])   . Syncope     Past Surgical History:  Procedure Laterality Date  . BACK SURGERY     x1 lumbar fusion  . CARDIAC CATHETERIZATION N/A 06/04/2015   Procedure: Left Heart Cath and Coronary Angiography;  Surgeon: [name], MD;  Location: [place] [facility] [facility];  Service: Cardiovascular;  Laterality: N/A;  . CHOLECYSTECTOMY     laparoscopic  . COLONOSCOPY WITH PROPOFOL N/A 09/04/2014   Procedure: COLONOSCOPY WITH PROPOFOL;  Surgeon: [name], MD;  [facility]: [facility];  Service: Endoscopy;  Laterality: N/A;  . LEFT HEART CATHETERIZATION WITH CORONARY ANGIOGRAM N/A 03/08/2012   Procedure: LEFT HEART CATHETERIZATION WITH CORONARY ANGIOGRAM;  Surgeon: Hillary Bow, MD;  Location: [facility] CATH LAB;  Service: Cardiovascular;  Laterality: N/A;  . SPINE SURGERY     fusion  . TUBAL LIGATION      There were no vitals filed for this visit.  Subjective Assessment - 12/11/19 0918    Subjective  Patient reports she had her MRI on Tuesday and she saw her PCP who told her to keep doing what she has been doing. She has not scheduled a follow-up with spine surgeon. She feels like she is slowly improving with less pain while walking and she feels stronger.    Limitations  Sitting;Standing;Walking;House hold activities    How long can you stand comfortably?  25 minutes    How long can you walk comfortably?  25 minutes    Diagnostic tests  MRI    Patient Stated Goals  Get rid of pain so she can walk and  move better    Currently in Pain?  Yes    Pain Score  4     Pain Location  Leg    Pain Orientation  Right    Pain Descriptors / Indicators  Tightness;Sharp    Pain Type  Chronic pain    Pain Onset  More than a month ago    Pain Frequency  Intermittent    Aggravating Factors   Walking, driving    Pain Relieving Factors  Exercises, heat pad    Effect of Pain on Daily Activities  Patient limited with general mobility and activity         [facility] PT Assessment - 12/11/19 0001      Assessment   Medical Diagnosis  Sciatica of right side    Referring Provider (PT)  Vevelyn Francois, NP      Precautions   Precautions  None      Restrictions   Weight Bearing Restrictions  No      Balance Screen   Has the patient fallen in the past 6  months  No    Has the patient had a decrease in activity level because of a fear of falling?   No    Is the patient reluctant to leave their home because of a fear of falling?   No      Prior Function   Level of Independence  Independent    Vocation  On disability    Leisure  Going to church      Cognition   Overall Cognitive Status  Within Functional Limits for tasks assessed      Observation/Other Assessments   Observations  Patient appears in no apparent distress    Focus on Therapeutic Outcomes (FOTO)   50% limitation      Sensation   Light Touch  Appears Intact      AROM   Lumbar Flexion  Patient able to reach toes but with limited actual lumbar flexion    Lumbar Extension  WFL   mild increase in right thigh pain   Lumbar - Right Side Bend  WFL    Lumbar - Left Side Bend  WFL    Lumbar - Right Rotation  WFL    Lumbar - Left Rotation  WFL      PROM   Overall PROM Comments  Hip PROM grossly WFL without pain      Strength   Right Hip Flexion  4+/5    Right Hip Extension  4-/5    Right Hip ABduction  3+/5    Left Hip Flexion  4+/5    Left Hip Extension  4-/5    Left Hip ABduction  4-/5    Right Knee Flexion  5/5    Right Knee Extension  5/5    Left Knee Flexion  5/5    Left Knee Extension  5/5    Right Ankle Dorsiflexion  2/5    Right Ankle Plantar Flexion  4/5    Right Ankle Inversion  3-/5    Right Ankle Eversion  4/5    Left Ankle Dorsiflexion  5/5    Left Ankle Plantar Flexion  4+/5    Left Ankle Inversion  5/5    Left Ankle Eversion  5/5      Flexibility   Soft Tissue Assessment /Muscle Length  yes    Hamstrings  WFL, > 90 deg bilaterally, reports pulling on right    Quadriceps  Limited bilaterally   hip  flexor   Piriformis  WFL, reports more of stretch on right                    [facility] Adult PT Treatment/Exercise - 12/11/19 0001      Exercises   Exercises  Knee/Hip      Lumbar Exercises: Stretches   Passive Hamstring Stretch  2  reps;20 seconds    Single Knee to Chest Stretch  2 reps;20 seconds    Hip Flexor Stretch  2 reps;20 seconds    Piriformis Stretch  2 reps;20 seconds      Lumbar Exercises: Supine   Pelvic Tilt  10 reps;5 seconds    Pelvic Tilt Limitations  90-90 position with legs on bolster focusing on PPT to promote lumbar flexion      Knee/Hip Exercises: Aerobic   Nustep  L5 x 5 min (UE and LE)      Manual Therapy   Manual Therapy  Joint mobilization    Joint Mobilization  LAD to RLE with gentle oscillitions, LAD combined with SLR             PT Education - 12/11/19 0922    Education Details  HEP    Person(s) Educated  Patient    Methods  Explanation;Demonstration;Verbal cues    Comprehension  Verbalized understanding;Returned demonstration;Verbal cues required;Need further instruction       PT Short Term Goals - 12/11/19 0924      PT SHORT TERM GOAL #1   Title  Patient will be I with initial HEP to progress with PT    Time  4    Period  Weeks    Status  Achieved    Target Date  11/13/19      PT SHORT TERM GOAL #2   Title  Patient will be able to walk >/= 10 minutes with </= 5/10 to improve community access    Baseline  Patient reports she can walk about 20-25 minutes with < 5/10 pain    Time  4    Period  Weeks    Status  Achieved    Target Date  11/13/19      PT SHORT TERM GOAL #3   Title  Patient will be able to negotiate stairs without limitation    Baseline  Patient reports no trouble with stairs as long as she goes slow    Time  4    Period  Weeks    Status  Achieved        PT Long Term Goals - 12/11/19 [id number]      PT LONG TERM GOAL #1   Title  Patient will be I with final HEP to maintain progress from PT    Time  6    Period  Weeks    Status  On-going    Target Date  01/22/20      PT LONG TERM GOAL #2   Title  Patient will exhibit improved strength of core and gluteals >/= 4/5 MMT to be able to perform heavy activities around home with only a little bit of  difficulty    Time  8    Period  Weeks    Status  On-going    Target Date  01/22/20      PT LONG TERM GOAL #3   Title  Patient will be able to sit and walk without deviation >/= 20 minutes with report </= 3/10 pain level to improve community access and negotaition  Baseline  Patient reports 4-5/10 pain level    Time  8    Period  Weeks    Status  On-going    Target Date  01/22/20      PT LONG TERM GOAL #4   Title  Patient will report improved functional level to </= 38% limitation on FOTO    Time  8    Period  Weeks    Status  On-going    Target Date  01/22/20            Plan - 12/11/19 [id number]    Clinical Impression Statement  Patient tolerated therapy well with no adverse effects. She continues to report right posterior thigh pain with activity but does exhibit improve strength of BLE. She cotnoinues to have weakness of the right ankle that seems more neurologic related. Her functional level has not improved based on FOTO but she did report improved ability to perform household tasks and less pain with walking. She would benefit from continued skilled PT to improve mobility and strength and reduce pain with activity and walking.    PT Treatment/Interventions  ADLs/Self Care Home Management;Cryotherapy;Electrical Stimulation;Moist Heat;Gait training;Stair training;Functional mobility training;Therapeutic activities;Therapeutic exercise;Balance training;Neuromuscular re-education;Patient/family education;Manual techniques;Dry needling;Passive range of motion;Taping;Spinal Manipulations;Joint Manipulations    PT Next Visit Plan  Assess HEP and progress PRN, NuStep, manual and modalities for right hip PRN, right hip and hamstring stretching, light core and gluteal strengthening    PT Home Exercise Plan  [id number]: supine single knee to chest stretch, supine piriformis stretch, hooklying hamstring stretch with strap, LTR, supine clam blue band, SLR, sideling hip abduction, sit<>stand     Consulted and Agree with Plan of Care  Patient       Patient will benefit from skilled therapeutic intervention in order to improve the following deficits and impairments:  Abnormal gait, Decreased range of motion, Difficulty walking, Decreased endurance, Decreased activity tolerance, Pain, Impaired flexibility, Decreased strength, Postural dysfunction  Visit Diagnosis: Sciatica, right side  Pain in right hip  Muscle weakness (generalized)  Other abnormalities of gait and mobility     Problem List Patient Active Problem List   Diagnosis Date Noted  . Vitamin D deficiency 09/10/2019  . CHF (congestive heart failure) ([place]) 01/30/2018  . OSA (obstructive sleep apnea) 03/01/2017  . ARF (acute renal failure) ([place]) 02/28/2017  . Hyponatremia 02/28/2017  . Syncope 07/23/2016  . Syncope and collapse 07/23/2016  . S/P cardiac catheterization, non obstructive disease 06/04/15 06/05/2015  . CAD in native artery 06/05/2015  . Fever 06/05/2015  . Hypokalemia 06/05/2015  . Metabolic syndrome [id number]  . Chest pain, neg MI, possible GI 06/04/2015  . Colon cancer screening 08/05/2014  . Severe obesity (BMI >= 40) ([place]) 08/05/2014  . Health care maintenance 05/13/2014  . Prediabetes 05/13/2014  . Chronic migraine 04/27/2014  . Atypical chest pain 04/27/2014  . SOB (shortness of breath) 09/16/2013  . Palpitations 09/16/2013  . HTN (hypertension) 03/09/2012  . Hyperlipidemia 03/09/2012  . Microcytic anemia   . Morbid obesity with BMI of 50.0-59.9, adult ([place])   . Precordial chest pain 03/06/2012  . Dizziness 03/06/2012    Hilda Blades, PT, DPT, LAT, ATC 12/11/19  11:50 AM Phone: [phone] Fax: [facility] [address] [place], [place], [address] Phone: [phone]   Fax:  [phone]  Name: CHRISTINEA CLINEBELL MRN: [id number] Date of Birth: Feb 04, 1961

## 2019-12-22 ENCOUNTER — Other Ambulatory Visit: Payer: Self-pay

## 2019-12-22 ENCOUNTER — Ambulatory Visit: Payer: Medicare HMO | Attending: Nurse Practitioner | Admitting: Physical Therapy

## 2019-12-22 ENCOUNTER — Encounter: Payer: Self-pay | Admitting: Physical Therapy

## 2019-12-22 DIAGNOSIS — M25551 Pain in right hip: Secondary | ICD-10-CM | POA: Diagnosis not present

## 2019-12-22 DIAGNOSIS — M6281 Muscle weakness (generalized): Secondary | ICD-10-CM | POA: Diagnosis not present

## 2019-12-22 DIAGNOSIS — R2689 Other abnormalities of gait and mobility: Secondary | ICD-10-CM | POA: Diagnosis not present

## 2019-12-22 DIAGNOSIS — M5431 Sciatica, right side: Secondary | ICD-10-CM | POA: Diagnosis not present

## 2019-12-22 NOTE — Therapy (Signed)
[place], [place], [address] Phone: [phone]   Fax:  [phone]  Physical Therapy Treatment  Patient Details  Name: Hannah Huerta MRN: [id number] Date of Birth: Jun 28, 1961 Referring Provider (PT): Vevelyn Francois, NP   Encounter Date: 12/22/2019  PT End of Session - 12/22/19 1312    Visit Number  8    Number of Visits  16    Date for PT Re-Evaluation  01/22/20    Authorization Type  [employer] MCR    Authorization Time Period  5/27-21-01/14/20    Authorization - Visit Number  1    Authorization - Number of Visits  6    PT Start Time  5701    PT Stop Time  1310    PT Time Calculation (min)  40 min       Past Medical History:  Diagnosis Date  . Abnormal Pap smear of cervix    pt couldnt state what type of abnormality  . Anemia   . CAD in native artery 06/05/2015  . Chest pain    a. 02/2012 abnl myoview - inferoapical reverisbility concerning for ischemia, EF 59%;   02/2012 nonobstructive cath  . CHF (congestive heart failure) ([place])   . Chronic migraine   . Diabetes mellitus without complication ([place])   . GERD (gastroesophageal reflux disease)   . HLD (hyperlipidemia)   . Hypertension   . Hypokalemia 06/05/2015  . Metabolic syndrome 77/93/9030  . Morbid obesity with BMI of 50.0-59.9, adult ([place])   . Prediabetes   . S/P cardiac catheterization, non obstructive disease 06/04/15 06/05/2015  . Sickle cell trait ([place])   . Syncope     Past Surgical History:  Procedure Laterality Date  . BACK SURGERY     x1 lumbar fusion  . CARDIAC CATHETERIZATION N/A 06/04/2015   Procedure: Left Heart Cath and Coronary Angiography;  Surgeon: [name], MD;  Location: [place] [facility];  Service: Cardiovascular;  Laterality: N/A;  . CHOLECYSTECTOMY     laparoscopic  . COLONOSCOPY WITH PROPOFOL N/A 09/04/2014   Procedure: COLONOSCOPY WITH PROPOFOL;  Surgeon: [name], MD;  Location: [facility];   Service: Endoscopy;  Laterality: N/A;  . LEFT HEART CATHETERIZATION WITH CORONARY ANGIOGRAM N/A 03/08/2012   Procedure: LEFT HEART CATHETERIZATION WITH CORONARY ANGIOGRAM;  Surgeon: Hillary Bow, MD;  Location: [facility] CATH [facility];  Service: Cardiovascular;  Laterality: N/A;  . SPINE SURGERY     fusion  . TUBAL LIGATION      There were no vitals filed for this visit.  Subjective Assessment - 12/22/19 1233    Currently in Pain?  Yes    Pain Score  5     Pain Orientation  Right    Pain Descriptors / Indicators  Tightness;Sharp    Pain Type  Chronic pain                        [facility] Adult PT Treatment/Exercise - 12/22/19 0001      Lumbar Exercises: Stretches   Active Hamstring Stretch  2 reps;20 seconds    Active Hamstring Stretch Limitations  supine with strap     Single Knee to Chest Stretch  2 reps;20 seconds    Lower Trunk Rotation Limitations  5 sec hold x10 each    Gastroc Stretch Limitations  slant board stretch x 30 sec       Lumbar Exercises: Supine   Pelvic Tilt  10 reps;5 seconds      Knee/Hip Exercises: Aerobic   Nustep  L5 x 5 min (UE and LE)      Knee/Hip Exercises: Seated   Sit to Sand  2 sets;10 reps      Knee/Hip Exercises: Supine   Bridges  2 sets;10 reps    Straight Leg Raises  2 sets;10 reps      Knee/Hip Exercises: Sidelying   Hip ABduction  2 sets;10 reps    Clams  x 15 each     Other Sidelying Knee/Hip Exercises  reverse clams x 15 each               PT Short Term Goals - 12/11/19 0924      PT SHORT TERM GOAL #1   Title  Patient will be I with initial HEP to progress with PT    Time  4    Period  Weeks    Status  Achieved    Target Date  11/13/19      PT SHORT TERM GOAL #2   Title  Patient will be able to walk >/= 10 minutes with </= 5/10 to improve community access    Baseline  Patient reports she can walk about 20-25 minutes with < 5/10 pain    Time  4    Period  Weeks    Status  Achieved    Target Date  11/13/19       PT SHORT TERM GOAL #3   Title  Patient will be able to negotiate stairs without limitation    Baseline  Patient reports no trouble with stairs as long as she goes slow    Time  4    Period  Weeks    Status  Achieved        PT Long Term Goals - 12/11/19 1962      PT LONG TERM GOAL #1   Title  Patient will be I with final HEP to maintain progress from PT    Time  6    Period  Weeks    Status  On-going    Target Date  01/22/20      PT LONG TERM GOAL #2   Title  Patient will exhibit improved strength of core and gluteals >/= 4/5 MMT to be able to perform heavy activities around home with only a little bit of difficulty    Time  8    Period  Weeks    Status  On-going    Target Date  01/22/20      PT LONG TERM GOAL #3   Title  Patient will be able to sit and walk without deviation >/= 20 minutes with report </= 3/10 pain level to improve community access and negotaition    Baseline  Patient reports 4-5/10 pain level    Time  8    Period  Weeks    Status  On-going    Target Date  01/22/20      PT LONG TERM GOAL #4   Title  Patient will report improved functional level to </= 38% limitation on FOTO    Time  8    Period  Weeks    Status  On-going    Target Date  01/22/20            Plan - 12/22/19 1233    Clinical Impression Statement  Pt reports MD wants to do back surgery. She has not scheduled it yet. She is not  sure she is ready yet. Continued with hip and core strength per PT POC. Tolerated session well with fatigue.    PT Next Visit Plan  Assess HEP and progress PRN, NuStep, manual and modalities for right hip PRN, right hip and hamstring stretching, light core and gluteal strengthening    PT Home Exercise Plan  [id number]: supine single knee to chest stretch, supine piriformis stretch, hooklying hamstring stretch with strap, LTR, supine clam blue band, SLR, sideling hip abduction, sit<>stand       Patient will benefit from skilled therapeutic intervention in  order to improve the following deficits and impairments:  Abnormal gait, Decreased range of motion, Difficulty walking, Decreased endurance, Decreased activity tolerance, Pain, Impaired flexibility, Decreased strength, Postural dysfunction  Visit Diagnosis: Sciatica, right side  Pain in right hip  Muscle weakness (generalized)  Other abnormalities of gait and mobility     Problem List Patient Active Problem List   Diagnosis Date Noted  . Vitamin D deficiency 09/10/2019  . CHF (congestive heart failure) ([place]) 01/30/2018  . OSA (obstructive sleep apnea) 03/01/2017  . ARF (acute renal failure) ([place]) 02/28/2017  . Hyponatremia 02/28/2017  . Syncope 07/23/2016  . Syncope and collapse 07/23/2016  . S/P cardiac catheterization, non obstructive disease 06/04/15 06/05/2015  . CAD in native artery 06/05/2015  . Fever 06/05/2015  . Hypokalemia 06/05/2015  . Metabolic syndrome 63/78/5885  . Chest pain, neg MI, possible GI 06/04/2015  . Colon cancer screening 08/05/2014  . Severe obesity (BMI >= 40) ([place]) 08/05/2014  . Health care maintenance 05/13/2014  . Prediabetes 05/13/2014  . Chronic migraine 04/27/2014  . Atypical chest pain 04/27/2014  . SOB (shortness of breath) 09/16/2013  . Palpitations 09/16/2013  . HTN (hypertension) 03/09/2012  . Hyperlipidemia 03/09/2012  . Microcytic anemia   . Morbid obesity with BMI of 50.0-59.9, adult ([place])   . Precordial chest pain 03/06/2012  . Dizziness 03/06/2012    Hannah Huerta, PTA 12/22/2019, 1:16 PM  [place], [place], [address] Phone: [phone]   Fax:  [phone]  Name: Hannah Huerta MRN: [id number] Date of Birth: 1961/02/18

## 2019-12-30 ENCOUNTER — Ambulatory Visit: Payer: Medicare HMO | Admitting: Physical Therapy

## 2019-12-30 ENCOUNTER — Other Ambulatory Visit: Payer: Self-pay

## 2019-12-30 ENCOUNTER — Encounter: Payer: Self-pay | Admitting: Physical Therapy

## 2019-12-30 DIAGNOSIS — M6281 Muscle weakness (generalized): Secondary | ICD-10-CM | POA: Diagnosis not present

## 2019-12-30 DIAGNOSIS — M25551 Pain in right hip: Secondary | ICD-10-CM | POA: Diagnosis not present

## 2019-12-30 DIAGNOSIS — M5431 Sciatica, right side: Secondary | ICD-10-CM | POA: Diagnosis not present

## 2019-12-30 DIAGNOSIS — R2689 Other abnormalities of gait and mobility: Secondary | ICD-10-CM | POA: Diagnosis not present

## 2019-12-30 NOTE — Therapy (Signed)
[place], [place], [address] Phone: [phone]   Fax:  [phone]  Physical Therapy Treatment  Patient Details  Name: Hannah Huerta MRN: [id number] Date of Birth: January 25, 1961 Referring Provider (PT): Vevelyn Francois, NP   Encounter Date: 12/30/2019   PT End of Session - 12/30/19 0910    Visit Number 9    Number of Visits 16    Date for PT Re-Evaluation 01/22/20    Authorization Type [employer] MCR    Authorization Time Period 5/27-21-01/14/20    Authorization - Visit Number 2    Authorization - Number of Visits 6    PT Start Time 0915    PT Stop Time 0955    PT Time Calculation (min) 40 min    Activity Tolerance Patient tolerated treatment well    Behavior During Therapy [facility] for tasks assessed/performed           Past Medical History:  Diagnosis Date  . Abnormal Pap smear of cervix    pt couldnt state what type of abnormality  . Anemia   . CAD in native artery 06/05/2015  . Chest pain    a. 02/2012 abnl myoview - inferoapical reverisbility concerning for ischemia, EF 59%;   02/2012 nonobstructive cath  . CHF (congestive heart failure) ([place])   . Chronic migraine   . Diabetes mellitus without complication ([place])   . GERD (gastroesophageal reflux disease)   . HLD (hyperlipidemia)   . Hypertension   . Hypokalemia 06/05/2015  . Metabolic syndrome 76/16/0737  . Morbid obesity with BMI of 50.0-59.9, adult ([place])   . Prediabetes   . S/P cardiac catheterization, non obstructive disease 06/04/15 06/05/2015  . Sickle cell trait ([place])   . Syncope     Past Surgical History:  Procedure Laterality Date  . BACK SURGERY     x1 lumbar fusion  . CARDIAC CATHETERIZATION N/A 06/04/2015   Procedure: Left Heart Cath and Coronary Angiography;  Surgeon: [name], MD;  Location: [place] [facility];  Service: Cardiovascular;  Laterality: N/A;  . CHOLECYSTECTOMY     laparoscopic  . COLONOSCOPY WITH PROPOFOL N/A  09/04/2014   Procedure: COLONOSCOPY WITH PROPOFOL;  Surgeon: [name], MD;  Location: [facility];  Service: Endoscopy;  Laterality: N/A;  . LEFT HEART CATHETERIZATION WITH CORONARY ANGIOGRAM N/A 03/08/2012   Procedure: LEFT HEART CATHETERIZATION WITH CORONARY ANGIOGRAM;  Surgeon: Hillary Bow, MD;  Location: [facility];  Service: Cardiovascular;  Laterality: N/A;  . SPINE SURGERY     fusion  . TUBAL LIGATION      There were no vitals filed for this visit.   Subjective Assessment - 12/30/19 0909    Subjective Patient reports that her doctor wants to do surgery, and she has follow-up in August. Patient reports this morning that the back of her thigh is really hurting bad for some reason. She has been doing about the same over the past few weeks.    Patient Stated Goals Get rid of pain so she can walk and move better    Currently in Pain? Yes    Pain Score 7     Pain Location Leg    Pain Orientation Right    Pain Descriptors / Indicators Tightness    Pain Type Chronic pain    Pain Radiating Towards right posterior thigh    Pain Onset More than a month ago    Pain Frequency Intermittent    Aggravating Factors  Walking, driving              [facility] PT Assessment - 12/30/19 0001      Strength   Right Ankle Dorsiflexion 2/5      Flexibility   Hamstrings WFL, > 90 deg bilaterally, reports pulling on right                         [facility] Adult PT Treatment/Exercise - 12/30/19 0001      Exercises   Exercises Knee/Hip      Lumbar Exercises: Stretches   Active Hamstring Stretch 2 reps;20 seconds    Active Hamstring Stretch Limitations supine    Single Knee to Chest Stretch 2 reps;20 seconds    Lower Trunk Rotation Limitations 5 sec hold x5 each    Hip Flexor Stretch 2 reps;20 seconds    Hip Flexor Stretch Limitations supine edge of mat table    Piriformis Stretch 2 reps;20 seconds    Gastroc Stretch 2 reps;20 seconds    Gastroc Stretch Limitations slant  board      Knee/Hip Exercises: Aerobic   Nustep L3 x 5 min (UE and LE)      Knee/Hip Exercises: Seated   Sit to [employer] 10 reps      Knee/Hip Exercises: Supine   Bridges 10 reps   3 sec hold   Bridges Limitations partial range    Straight Leg Raises 10 reps   3 sec hold   Other Supine Knee/Hip Exercises Clamshell with blue band x10 with 3 sec hold      Manual Therapy   Joint Mobilization LAD to RLE with gentle oscillitions                  PT Education - 12/30/19 0910    Education Details HEP    Person(s) Educated Patient    Methods Explanation;Demonstration;Verbal cues    Comprehension Verbalized understanding;Returned demonstration;Verbal cues required;Need further instruction            PT Short Term Goals - 12/11/19 0924      PT SHORT TERM GOAL #1   Title Patient will be I with initial HEP to progress with PT    Time 4    Period Weeks    Status Achieved    Target Date 11/13/19      PT SHORT TERM GOAL #2   Title Patient will be able to walk >/= 10 minutes with </= 5/10 to improve community access    Baseline Patient reports she can walk about 20-25 minutes with < 5/10 pain    Time 4    Period Weeks    Status Achieved    Target Date 11/13/19      PT SHORT TERM GOAL #3   Title Patient will be able to negotiate stairs without limitation    Baseline Patient reports no trouble with stairs as long as she goes slow    Time 4    Period Weeks    Status Achieved             PT Long Term Goals - 12/11/19 0630      PT LONG TERM GOAL #1   Title Patient will be I with final HEP to maintain progress from PT    Time 6    Period Weeks    Status On-going    Target Date 01/22/20      PT LONG TERM GOAL #2   Title Patient will  exhibit improved strength of core and gluteals >/= 4/5 MMT to be able to perform heavy activities around home with only a little bit of difficulty    Time 8    Period Weeks    Status On-going    Target Date 01/22/20      PT LONG TERM  GOAL #3   Title Patient will be able to sit and walk without deviation >/= 20 minutes with report </= 3/10 pain level to improve community access and negotaition    Baseline Patient reports 4-5/10 pain level    Time 8    Period Weeks    Status On-going    Target Date 01/22/20      PT LONG TERM GOAL #4   Title Patient will report improved functional level to </= 38% limitation on FOTO    Time 8    Period Weeks    Status On-going    Target Date 01/22/20                 Plan - 12/30/19 0911    Clinical Impression Statement Patient tolerated therapy well with no advers effects. Focused and reducing pain with stretching and light core/hip strengthening. Patient did report improvement in symptoms following therapy and was re-instructed on HEP to perform at home to maintain progress. Focused on holds with exercise this visit rather than increased reps and patient seemed to respond well to this. She would benefit from continued skilled PT to improve mobility and strength and reduce pain with activity and walking.    PT Treatment/Interventions ADLs/Self Care Home Management;Cryotherapy;Electrical Stimulation;Moist Heat;Gait training;Stair training;Functional mobility training;Therapeutic activities;Therapeutic exercise;Balance training;Neuromuscular re-education;Patient/family education;Manual techniques;Dry needling;Passive range of motion;Taping;Spinal Manipulations;Joint Manipulations    PT Next Visit Plan Assess HEP and progress PRN, NuStep, manual and modalities for right hip PRN, right hip and hamstring stretching, light core and gluteal strengthening    PT Home Exercise Plan [id number]: supine single knee to chest stretch, supine piriformis stretch, hooklying hamstring stretch with strap, LTR, supine clam blue band, SLR, sideling hip abduction, sit<>stand    Consulted and Agree with Plan of Care Patient           Patient will benefit from skilled therapeutic intervention in order to  improve the following deficits and impairments:  Abnormal gait, Decreased range of motion, Difficulty walking, Decreased endurance, Decreased activity tolerance, Pain, Impaired flexibility, Decreased strength, Postural dysfunction  Visit Diagnosis: Sciatica, right side  Pain in right hip  Muscle weakness (generalized)  Other abnormalities of gait and mobility     Problem List Patient Active Problem List   Diagnosis Date Noted  . Vitamin D deficiency 09/10/2019  . CHF (congestive heart failure) ([place]) 01/30/2018  . OSA (obstructive sleep apnea) 03/01/2017  . ARF (acute renal failure) ([place]) 02/28/2017  . Hyponatremia 02/28/2017  . Syncope 07/23/2016  . Syncope and collapse 07/23/2016  . S/P cardiac catheterization, non obstructive disease 06/04/15 06/05/2015  . CAD in native artery 06/05/2015  . Fever 06/05/2015  . Hypokalemia 06/05/2015  . Metabolic syndrome 42/87/6811  . Chest pain, neg MI, possible GI 06/04/2015  . Colon cancer screening 08/05/2014  . Severe obesity (BMI >= 40) ([place]) 08/05/2014  . Health care maintenance 05/13/2014  . Prediabetes 05/13/2014  . Chronic migraine 04/27/2014  . Atypical chest pain 04/27/2014  . SOB (shortness of breath) 09/16/2013  . Palpitations 09/16/2013  . HTN (hypertension) 03/09/2012  . Hyperlipidemia 03/09/2012  . Microcytic anemia   . Morbid obesity with BMI of 50.0-59.9,  adult [facility])   . Precordial chest pain 03/06/2012  . Dizziness 03/06/2012    Hilda Blades, PT, DPT, LAT, ATC 12/30/19  9:55 AM Phone: [phone] Fax: [place] [facility] [address] [place], [place], [address] Phone: [phone]   Fax:  [phone]  Name: Hannah Huerta MRN: [id number] Date of Birth: 1961-01-11

## 2020-01-05 ENCOUNTER — Other Ambulatory Visit: Payer: Self-pay | Admitting: Neurosurgery

## 2020-01-06 ENCOUNTER — Encounter: Payer: Self-pay | Admitting: Physical Therapy

## 2020-01-06 ENCOUNTER — Ambulatory Visit: Payer: Medicare HMO | Admitting: Physical Therapy

## 2020-01-06 ENCOUNTER — Other Ambulatory Visit: Payer: Self-pay

## 2020-01-06 DIAGNOSIS — R2689 Other abnormalities of gait and mobility: Secondary | ICD-10-CM

## 2020-01-06 DIAGNOSIS — M5431 Sciatica, right side: Secondary | ICD-10-CM | POA: Diagnosis not present

## 2020-01-06 DIAGNOSIS — M25551 Pain in right hip: Secondary | ICD-10-CM

## 2020-01-06 DIAGNOSIS — M6281 Muscle weakness (generalized): Secondary | ICD-10-CM

## 2020-01-06 NOTE — Therapy (Signed)
[place], [place], [address] Phone: [phone]   Fax:  [phone]  Physical Therapy Treatment  Patient Details  Name: Hannah Huerta MRN: [id number] Date of Birth: [birth year] Referring Provider (PT): Vevelyn Francois, NP   Encounter Date: 01/06/2020   PT End of Session - 01/06/20 0920    Visit Number 10    Number of Visits 16    Date for PT Re-Evaluation 01/22/20    Authorization Type [employer] MCR    Authorization Time Period 5/27-21-01/14/20    Authorization - Visit Number 3    Authorization - Number of Visits 6    PT Start Time 0915    PT Stop Time 0955    PT Time Calculation (min) 40 min    Activity Tolerance Patient tolerated treatment well    Behavior During Therapy [facility] for tasks assessed/performed           Past Medical History:  Diagnosis Date  . Abnormal Pap smear of cervix    pt couldnt state what type of abnormality  . Anemia   . CAD in native artery 06/05/2015  . Chest pain    a. 02/2012 abnl myoview - inferoapical reverisbility concerning for ischemia, EF 59%;   02/2012 nonobstructive cath  . CHF (congestive heart failure) ([place])   . Chronic migraine   . Diabetes mellitus without complication ([place])   . GERD (gastroesophageal reflux disease)   . HLD (hyperlipidemia)   . Hypertension   . Hypokalemia 06/05/2015  . Metabolic syndrome 25/36/6440  . Morbid obesity with BMI of 50.0-59.9, adult ([place])   . Prediabetes   . S/P cardiac catheterization, non obstructive disease 06/04/15 06/05/2015  . Sickle cell trait ([place])   . Syncope     Past Surgical History:  Procedure Laterality Date  . BACK SURGERY     x1 lumbar fusion  . CARDIAC CATHETERIZATION N/A 06/04/2015   Procedure: Left Heart Cath and Coronary Angiography;  Surgeon: [name], MD;  Location: [place] [facility];  Service: Cardiovascular;  Laterality: N/A;  . CHOLECYSTECTOMY     laparoscopic  . COLONOSCOPY WITH PROPOFOL  N/A 09/04/2014   Procedure: COLONOSCOPY WITH PROPOFOL;  Surgeon: [name], MD;  Location: [facility];  Service: Endoscopy;  Laterality: N/A;  . LEFT HEART CATHETERIZATION WITH CORONARY ANGIOGRAM N/A 03/08/2012   Procedure: LEFT HEART CATHETERIZATION WITH CORONARY ANGIOGRAM;  Surgeon: Hillary Bow, MD;  Location: [facility] CATH LAB;  Service: Cardiovascular;  Laterality: N/A;  . SPINE SURGERY     fusion  . TUBAL LIGATION      There were no vitals filed for this visit.   Subjective Assessment - 01/06/20 0917    Subjective Patient reports continue leg pain and that she has her surgery scheduled for July. Continues to to state that bad weather worsens her pain.    Patient Stated Goals Get rid of pain so she can walk and move better    Currently in Pain? Yes    Pain Score 7     Pain Location Leg    Pain Orientation Right    Pain Descriptors / Indicators Tightness;Throbbing    Pain Type Chronic pain    Pain Radiating Towards right posterior thigh    Pain Onset More than a month ago    Pain Frequency Intermittent    Aggravating Factors  Walking, driving              [facility] PT Assessment -  01/06/20 0001      AROM   Overall AROM Comments Patient exhibits lumbar AROM grossly WFL, limited flexion with forward bend, no increased leg symptoms      Strength   Right Hip Flexion 4+/5    Right Hip Extension 4-/5    Right Hip ABduction 3+/5    Left Hip Flexion 4+/5    Left Hip Extension 4-/5    Left Hip ABduction 4-/5    Right Ankle Dorsiflexion 2/5                         [facility] Adult PT Treatment/Exercise - 01/06/20 0001      Exercises   Exercises Knee/Hip;Lumbar      Lumbar Exercises: Stretches   Passive Hamstring Stretch 2 reps;20 seconds    Passive Hamstring Stretch Limitations supine    Single Knee to Chest Stretch 2 reps;20 seconds    Single Knee to Chest Stretch Limitations supine    Lower Trunk Rotation 5 reps;10 seconds    Hip Flexor Stretch 2 reps;20  seconds    Hip Flexor Stretch Limitations supine edge of mat table    Piriformis Stretch 2 reps;20 seconds    Piriformis Stretch Limitations supine      Knee/Hip Exercises: Aerobic   Nustep L3 x 5 min (UE and LE)      Knee/Hip Exercises: Seated   Sit to Sand 2 sets;10 reps      Knee/Hip Exercises: Supine   Bridges 2 sets;10 reps   3 sec hold   Straight Leg Raises 2 sets;10 reps   3 sec hold   Other Supine Knee/Hip Exercises Clamshell with blue band 2x10 with 3 sec hold      Manual Therapy   Joint Mobilization LAD to RLE with gentle oscillitions                  PT Education - 01/06/20 0919    Education Details Continued HEP and stretching    Person(s) Educated Patient    Methods Explanation;Demonstration;Verbal cues    Comprehension Verbalized understanding;Returned demonstration;Verbal cues required;Need further instruction            PT Short Term Goals - 12/11/19 0924      PT SHORT TERM GOAL #1   Title Patient will be I with initial HEP to progress with PT    Time 4    Period Weeks    Status Achieved    Target Date 11/13/19      PT SHORT TERM GOAL #2   Title Patient will be able to walk >/= 10 minutes with </= 5/10 to improve community access    Baseline Patient reports she can walk about 20-25 minutes with < 5/10 pain    Time 4    Period Weeks    Status Achieved    Target Date 11/13/19      PT SHORT TERM GOAL #3   Title Patient will be able to negotiate stairs without limitation    Baseline Patient reports no trouble with stairs as long as she goes slow    Time 4    Period Weeks    Status Achieved             PT Long Term Goals - 12/11/19 8115      PT LONG TERM GOAL #1   Title Patient will be I with final HEP to maintain progress from PT    Time 6  Period Weeks    Status On-going    Target Date 01/22/20      PT LONG TERM GOAL #2   Title Patient will exhibit improved strength of core and gluteals >/= 4/5 MMT to be able to perform  heavy activities around home with only a little bit of difficulty    Time 8    Period Weeks    Status On-going    Target Date 01/22/20      PT LONG TERM GOAL #3   Title Patient will be able to sit and walk without deviation >/= 20 minutes with report </= 3/10 pain level to improve community access and negotaition    Baseline Patient reports 4-5/10 pain level    Time 8    Period Weeks    Status On-going    Target Date 01/22/20      PT LONG TERM GOAL #4   Title Patient will report improved functional level to </= 38% limitation on FOTO    Time 8    Period Weeks    Status On-going    Target Date 01/22/20                 Plan - 01/06/20 0920    Clinical Impression Statement Patient tolerated therapy well with no advers effects. Continued working on stretching and light core/hip strengthening with good tolerance. Patient has surgery scheduled for 02/05/2020 so will keep her next scheduled PT appointment but will discharge after with independent stretching and strengthening program. She would benefit from continued skilled PT to improve mobility and strength and reduce pain with activity and walking.    PT Treatment/Interventions ADLs/Self Care Home Management;Cryotherapy;Electrical Stimulation;Moist Heat;Gait training;Stair training;Functional mobility training;Therapeutic activities;Therapeutic exercise;Balance training;Neuromuscular re-education;Patient/family education;Manual techniques;Dry needling;Passive range of motion;Taping;Spinal Manipulations;Joint Manipulations    PT Next Visit Plan Assess HEP and progress PRN, NuStep, manual and modalities for right hip PRN, right hip and hamstring stretching, light core and gluteal strengthening    PT Home Exercise Plan [id number]: supine single knee to chest stretch, supine piriformis stretch, hooklying hamstring stretch with strap, LTR, supine clam blue band, SLR, sideling hip abduction, sit<>stand    Consulted and Agree with Plan of Care  Patient           Patient will benefit from skilled therapeutic intervention in order to improve the following deficits and impairments:  Abnormal gait, Decreased range of motion, Difficulty walking, Decreased endurance, Decreased activity tolerance, Pain, Impaired flexibility, Decreased strength, Postural dysfunction  Visit Diagnosis: Sciatica, right side  Pain in right hip  Muscle weakness (generalized)  Other abnormalities of gait and mobility     Problem List Patient Active Problem List   Diagnosis Date Noted  . Vitamin D deficiency 09/10/2019  . CHF (congestive heart failure) ([place]) 01/30/2018  . OSA (obstructive sleep apnea) 03/01/2017  . ARF (acute renal failure) ([place]) 02/28/2017  . Hyponatremia 02/28/2017  . Syncope 07/23/2016  . Syncope and collapse 07/23/2016  . S/P cardiac catheterization, non obstructive disease 06/04/15 06/05/2015  . CAD in native artery 06/05/2015  . Fever 06/05/2015  . Hypokalemia 06/05/2015  . Metabolic syndrome 82/99/3716  . Chest pain, neg MI, possible GI 06/04/2015  . Colon cancer screening 08/05/2014  . Severe obesity (BMI >= 40) ([place]) 08/05/2014  . Health care maintenance 05/13/2014  . Prediabetes 05/13/2014  . Chronic migraine 04/27/2014  . Atypical chest pain 04/27/2014  . SOB (shortness of breath) 09/16/2013  . Palpitations 09/16/2013  . HTN (hypertension) 03/09/2012  . Hyperlipidemia  03/09/2012  . Microcytic anemia   . Morbid obesity with BMI of 50.0-59.9, adult ([place])   . Precordial chest pain 03/06/2012  . Dizziness 03/06/2012    Hilda Blades, PT, DPT, LAT, ATC 01/06/20  9:59 AM Phone: [phone] Fax: [facility] [address] [place], [place], [address] Phone: [phone]   Fax:  [phone]  Name: Hannah Huerta MRN: [id number] Date of Birth: 05/06/1961

## 2020-01-13 ENCOUNTER — Ambulatory Visit: Payer: Medicare HMO | Admitting: Physical Therapy

## 2020-01-13 ENCOUNTER — Encounter: Payer: Self-pay | Admitting: Physical Therapy

## 2020-01-13 ENCOUNTER — Other Ambulatory Visit: Payer: Self-pay

## 2020-01-13 ENCOUNTER — Telehealth: Payer: Self-pay | Admitting: Nurse Practitioner

## 2020-01-13 DIAGNOSIS — M25551 Pain in right hip: Secondary | ICD-10-CM | POA: Diagnosis not present

## 2020-01-13 DIAGNOSIS — M5431 Sciatica, right side: Secondary | ICD-10-CM

## 2020-01-13 DIAGNOSIS — M6281 Muscle weakness (generalized): Secondary | ICD-10-CM

## 2020-01-13 DIAGNOSIS — R2689 Other abnormalities of gait and mobility: Secondary | ICD-10-CM | POA: Diagnosis not present

## 2020-01-13 NOTE — Telephone Encounter (Signed)
Called spoke with patient in regards to this.

## 2020-01-13 NOTE — Telephone Encounter (Signed)
Pt wants to know why she was stated as "high risk" by [place] for her upcoming surgery

## 2020-01-13 NOTE — Therapy (Signed)
[place], [place], [address] Phone: [phone]   Fax:  [phone]  Physical Therapy Treatment / Discharge  Patient Details  Name: Hannah Huerta MRN: [id number] Date of Birth: 1960/12/12 Referring Provider (PT): Vevelyn Francois, NP   Encounter Date: 01/13/2020   PT End of Session - 01/13/20 0912    Visit Number 11    Number of Visits 16    Date for PT Re-Evaluation 01/22/20    Authorization Type [employer] MCR    Authorization Time Period 5/27-21-01/14/20    Authorization - Visit Number 4    Authorization - Number of Visits 6    PT Start Time 0905    PT Stop Time 0945    PT Time Calculation (min) 40 min    Activity Tolerance Patient tolerated treatment well    Behavior During Therapy [facility] for tasks assessed/performed           Past Medical History:  Diagnosis Date   Abnormal Pap smear of cervix    pt couldnt state what type of abnormality   Anemia    CAD in native artery 06/05/2015   Chest pain    a. 02/2012 abnl myoview - inferoapical reverisbility concerning for ischemia, EF 59%;   02/2012 nonobstructive cath   CHF (congestive heart failure) ([facility])    Chronic migraine    Diabetes mellitus without complication ([facility])    GERD (gastroesophageal reflux disease)    HLD (hyperlipidemia)    Hypertension    Hypokalemia 42/39/5320   Metabolic syndrome 23/34/3568   Morbid obesity with BMI of 50.0-59.9, adult ([place])    Prediabetes    S/P cardiac catheterization, non obstructive disease 06/04/15 06/05/2015   Sickle cell trait ([place])    Syncope     Past Surgical History:  Procedure Laterality Date   BACK SURGERY     x1 lumbar fusion   CARDIAC CATHETERIZATION N/A 06/04/2015   Procedure: Left Heart Cath and Coronary Angiography;  Surgeon: [name], MD;  Location: [place] [facility];  Service: Cardiovascular;  Laterality: N/A;   CHOLECYSTECTOMY     laparoscopic   COLONOSCOPY WITH  PROPOFOL N/A 09/04/2014   Procedure: COLONOSCOPY WITH PROPOFOL;  Surgeon: [name], MD;  Location[facility];  Service: Endoscopy;  Laterality: N/A;   LEFT HEART CATHETERIZATION WITH CORONARY ANGIOGRAM N/A 03/08/2012   Procedure: LEFT HEART CATHETERIZATION WITH CORONARY ANGIOGRAM;  Surgeon: Hillary Bow, MD;  Location: [facility] CATH LAB;  Service: Cardiovascular;  Laterality: N/A;   SPINE SURGERY     fusion   TUBAL LIGATION      There were no vitals filed for this visit.   Subjective Assessment - 01/13/20 0907    Subjective Patient reports slight improvement but continued pain with activities especially driving. She has her surgery scheduled for July and is hoping to have PT after.    How long can you sit comfortably? 10-15 mintes    How long can you stand comfortably? 25 minutes    How long can you walk comfortably? 25 minutes    Patient Stated Goals Get rid of pain so she can walk and move better    Currently in Pain? Yes    Pain Score 4     Pain Location Leg    Pain Orientation Right    Pain Descriptors / Indicators Tightness;Throbbing    Pain Type Chronic pain    Pain Radiating Towards right posterior thigh    Pain Onset  More than a month ago    Pain Frequency Intermittent    Aggravating Factors  Walking, driving    Pain Relieving Factors Exercise, stretching    Effect of Pain on Daily Activities Patient limited with general mobility and activity              [facility] PT Assessment - 01/13/20 0001      Assessment   Medical Diagnosis Sciatica of right side    Referring Provider (PT) Vevelyn Francois, NP      Precautions   Precautions None      Restrictions   Weight Bearing Restrictions No      Balance Screen   Has the patient fallen in the past 6 months No      Prior Function   Level of Independence Independent      Observation/Other Assessments   Observations Patient appears in no apparent distress    Focus on Therapeutic Outcomes (FOTO)  45% limitation       Sensation   Light Touch Appears Intact      AROM   Overall AROM Comments Patient exhibits lumbar AROM grossly WFL, limited flexion with forward bend, no increased leg symptoms      Strength   Right Hip Flexion 4+/5    Right Hip Extension 4-/5    Right Hip ABduction 3+/5    Left Hip Flexion 4+/5    Left Hip Extension 4-/5    Left Hip ABduction 4-/5    Right Ankle Dorsiflexion 2/5                         [facility] Adult PT Treatment/Exercise - 01/13/20 0001      Exercises   Exercises Knee/Hip;Lumbar      Lumbar Exercises: Stretches   Passive Hamstring Stretch 2 reps;20 seconds    Passive Hamstring Stretch Limitations supine    Single Knee to Chest Stretch 2 reps;20 seconds    Single Knee to Chest Stretch Limitations supine    Lower Trunk Rotation 5 reps;10 seconds    Hip Flexor Stretch 2 reps;20 seconds    Hip Flexor Stretch Limitations supine edge of mat table    Piriformis Stretch 2 reps;20 seconds    Piriformis Stretch Limitations supine      Lumbar Exercises: Aerobic   Nustep L4 x 6 min (UE and LE)      Lumbar Exercises: Seated   Sit to Stand 10 reps   2 sets   Sit to Stand Limitations hands on thighs      Lumbar Exercises: Supine   Clam 10 reps;3 seconds   2 sets   Clam Limitations blue band      Lumbar Exercises: Sidelying   Hip Abduction 10 reps      Knee/Hip Exercises: Supine   Bridges 2 sets;10 reps   3 sec hold   Straight Leg Raises 2 sets;10 reps                  PT Education - 01/13/20 0911    Education Details HEP consistency    Person(s) Educated Patient    Methods Explanation    Comprehension Verbalized understanding;Returned demonstration            PT Short Term Goals - 12/11/19 0924      PT SHORT TERM GOAL #1   Title Patient will be I with initial HEP to progress with PT    Time 4    Period  Weeks    Status Achieved    Target Date 11/13/19      PT SHORT TERM GOAL #2   Title Patient will be able to walk >/=  10 minutes with </= 5/10 to improve community access    Baseline Patient reports she can walk about 20-25 minutes with < 5/10 pain    Time 4    Period Weeks    Status Achieved    Target Date 11/13/19      PT SHORT TERM GOAL #3   Title Patient will be able to negotiate stairs without limitation    Baseline Patient reports no trouble with stairs as long as she goes slow    Time 4    Period Weeks    Status Achieved             PT Long Term Goals - 01/13/20 3151      PT LONG TERM GOAL #1   Title Patient will be I with final HEP to maintain progress from PT    Time 6    Period Weeks    Status Achieved      PT LONG TERM GOAL #2   Title Patient will exhibit improved strength of core and gluteals >/= 4/5 MMT to be able to perform heavy activities around home with only a little bit of difficulty    Time 8    Period Weeks    Status Partially Met      PT LONG TERM GOAL #3   Title Patient will be able to sit and walk without deviation >/= 20 minutes with report </= 3/10 pain level to improve community access and negotaition    Baseline Patient reports 4-5/10 pain level    Time 8    Period Weeks    Status Partially Met      PT LONG TERM GOAL #4   Title Patient will report improved functional level to </= 38% limitation on FOTO    Time 8    Period Weeks    Status Partially Met                 Plan - 01/13/20 0912    Clinical Impression Statement Patient tolerated therapy well with no advers effects. She will be discharged from therapy this visit because she is scheduled to have lumbar surgery on 02/05/2020. Reviewed stretching and exercises for home that she can continue to perform, but following surgery she should follow the surgeons recommendations and she was instructed on general precautions and expectations following lumbar surgery.    PT Treatment/Interventions ADLs/Self Care Home Management;Cryotherapy;Electrical Stimulation;Moist Heat;Gait training;Stair  training;Functional mobility training;Therapeutic activities;Therapeutic exercise;Balance training;Neuromuscular re-education;Patient/family education;Manual techniques;Dry needling;Passive range of motion;Taping;Spinal Manipulations;Joint Manipulations    PT Next Visit Plan NA - discharge    PT Home Exercise Plan [id number]: supine single knee to chest stretch, supine piriformis stretch, hooklying hamstring stretch with strap, LTR, supine clam blue band, SLR, sideling hip abduction, sit<>stand    Consulted and Agree with Plan of Care Patient           Patient will benefit from skilled therapeutic intervention in order to improve the following deficits and impairments:  Abnormal gait, Decreased range of motion, Difficulty walking, Decreased endurance, Decreased activity tolerance, Pain, Impaired flexibility, Decreased strength, Postural dysfunction  Visit Diagnosis: Sciatica, right side  Pain in right hip  Muscle weakness (generalized)  Other abnormalities of gait and mobility     Problem List Patient Active Problem List   Diagnosis  Date Noted   Vitamin D deficiency 09/10/2019   CHF (congestive heart failure) ([place]) 01/30/2018   OSA (obstructive sleep apnea) 03/01/2017   ARF (acute renal failure) ([place]) 02/28/2017   Hyponatremia 02/28/2017   Syncope 07/23/2016   Syncope and collapse 07/23/2016   S/P cardiac catheterization, non obstructive disease 06/04/15 06/05/2015   CAD in native artery 06/05/2015   Fever 06/05/2015   Hypokalemia 76/39/4320   Metabolic syndrome 03/79/4446   Chest pain, neg MI, possible GI 06/04/2015   Colon cancer screening 08/05/2014   Severe obesity (BMI >= 40) ([place]) 08/05/2014   Health care maintenance 05/13/2014   Prediabetes 05/13/2014   Chronic migraine 04/27/2014   Atypical chest pain 04/27/2014   SOB (shortness of breath) 09/16/2013   Palpitations 09/16/2013   HTN (hypertension) 03/09/2012   Hyperlipidemia 03/09/2012    Microcytic anemia    Morbid obesity with BMI of 50.0-59.9, adult ([place])    Precordial chest pain 03/06/2012   Dizziness 03/06/2012     PHYSICAL THERAPY DISCHARGE SUMMARY  Visits from Start of Care: 11  Current functional level related to goals / functional outcomes: See above   Remaining deficits: See above   Education / Equipment: HEP Plan: Patient agrees to discharge.  Patient goals were not met. Patient is being discharged due to the patient's request.  ?????     Hilda Blades, PT, DPT, LAT, ATC 01/13/20  9:45 AM Phone: [phone] Fax: [place] [facility] [address] [place], [place], [address] Phone: [phone]   Fax:  [phone]  Name: Hannah Huerta MRN: [id number] Date of Birth: 1961/07/14

## 2020-01-16 ENCOUNTER — Other Ambulatory Visit: Payer: Self-pay | Admitting: Neurosurgery

## 2020-01-26 NOTE — Pre-Procedure Instructions (Signed)
Hannah Huerta  01/26/2020      [facility] [place], [place] [address] [place] [address] Phone: [phone] Fax: [phone]  [place], [place] [address] [place] [address] Phone: [phone] Fax: [phone]    Your procedure is scheduled on July 22  Report to [facility] Entrance A at 5:30 A.M.  Call this number if you have problems the morning of surgery:  [phone]   Remember:  Do not eat or drink after midnight.      Take these medicines the morning of surgery with A SIP OF WATER :                             Amlodipine (norvasc)             Carvedilol (coreg)          7 days prior to surgery STOP taking any Aspirin (unless otherwise instructed by your surgeon), Aleve, Naproxen, Ibuprofen, Motrin, Advil, Goody's, BC's, all herbal medications, fish oil, and all vitamins.                           Follow your surgeon's instructions on when to stop Aspirin.  If no instructions were given by your surgeon then you will need to call the office to get those instructions.                        How to Manage Your Diabetes Before and After Surgery  Why is it important to control my blood sugar before and after surgery?  Improving blood sugar levels before and after surgery helps healing and can limit problems.  A way of improving blood sugar control is eating a healthy diet by: o  Eating less sugar and carbohydrates o  Increasing activity/exercise o  Talking with your doctor about reaching your blood sugar goals  High blood sugars (greater than 180 mg/dL) can raise your risk of infections and slow your recovery, so you will need to focus on controlling your diabetes during the weeks before surgery.  Make sure that the doctor who takes care of your diabetes knows about your planned surgery including the date and  location.  How do I manage my blood sugar before surgery?  Check your blood sugar at least 4 times a day, starting 2 days before surgery, to make sure that the level is not too high or low. o Check your blood sugar the morning of your surgery when you wake up and every 2 hours until you get to the Short Stay unit.  If your blood sugar is less than 70 mg/dL, you will need to treat for low blood sugar: o Do not take insulin. o Treat a low blood sugar (less than 70 mg/dL) with  cup of clear juice (cranberry or apple), 4 glucose tablets, OR glucose gel. Recheck blood sugar in 15 minutes after treatment (to make sure it is greater than 70 mg/dL). If your blood sugar is not greater than 70 mg/dL on recheck, call [phone] o  for further instructions.  Report your blood sugar to the short stay nurse when you get to Short Stay.   If you are admitted to the hospital after surgery: o  Your blood sugar will be checked by the staff and you will probably be given insulin after surgery (instead of oral diabetes medicines) to make sure you have good blood sugar levels. o The goal for blood sugar control after surgery is 80-180 mg/dL.       WHAT DO I DO ABOUT MY DIABETES MEDICATION?    Do not take oral diabetes medicines (pills) the morning of surgery. (metformin /glucophage)     Do not wear jewelry, make-up or nail polish.  Do not wear lotions, powders, or perfumes, or deodorant.  Do not shave 48 hours prior to surgery.  Men may shave face and neck.  Do not bring valuables to the hospital.  [facility] is not responsible for any belongings or valuables.  Contacts, dentures or bridgework may not be worn into surgery.  Leave your suitcase in the car.  After surgery it may be brought to your room.  For patients admitted to the hospital, discharge time will be determined by your treatment team.  Patients discharged the day of surgery will not be allowed to drive home.    Special  instructions:  [place]- Preparing For Surgery  Before surgery, you can play an important role. Because skin is not sterile, your skin needs to be as free of germs as possible. You can reduce the number of germs on your skin by washing with CHG (chlorahexidine gluconate) Soap before surgery.  CHG is an antiseptic cleaner which kills germs and bonds with the skin to continue killing germs even after washing.    Oral Hygiene is also important to reduce your risk of infection.  Remember - BRUSH YOUR TEETH THE MORNING OF SURGERY WITH YOUR REGULAR TOOTHPASTE  Please do not use if you have an allergy to CHG or antibacterial soaps. If your skin becomes reddened/irritated stop using the CHG.  Do not shave (including legs and underarms) for at least 48 hours prior to first CHG shower. It is OK to shave your face.  Please follow these instructions carefully.   1. Shower the NIGHT BEFORE SURGERY and the MORNING OF SURGERY with CHG.   2. If you chose to wash your hair, wash your hair first as usual with your normal shampoo.  3. After you shampoo, rinse your hair and body thoroughly to remove the shampoo.  4. Use CHG as you would any other liquid soap. You can apply CHG directly to the skin and wash gently with a scrungie or a clean washcloth.   5. Apply the CHG Soap to your body ONLY FROM THE NECK DOWN.  Do not use on open wounds or open sores. Avoid contact with your eyes, ears, mouth and genitals (private parts). Wash Face and genitals (private parts)  with your normal soap.  6. Wash thoroughly, paying special attention to the area where your surgery will be performed.  7. Thoroughly rinse your body with warm water from the neck down.  8. DO NOT shower/wash with your normal soap after using and rinsing off the CHG Soap.  9. Pat yourself dry with a CLEAN TOWEL.  10. Wear CLEAN PAJAMAS to bed the night before surgery, wear comfortable clothes the morning of surgery  11. Place CLEAN SHEETS on  your bed the night of your first shower and DO NOT SLEEP WITH PETS.    Day of Surgery:  Do not apply any deodorants/lotions.  Please wear clean clothes to the hospital/surgery center.   Remember to brush your teeth WITH YOUR REGULAR  TOOTHPASTE.    Please read over the following fact sheets that you were given. Coughing and Deep Breathing and Surgical Site Infection Prevention

## 2020-01-27 ENCOUNTER — Encounter (HOSPITAL_COMMUNITY): Payer: Self-pay

## 2020-01-27 ENCOUNTER — Encounter (HOSPITAL_COMMUNITY)
Admission: RE | Admit: 2020-01-27 | Discharge: 2020-01-27 | Disposition: A | Discharge status: Home / Self Care | Payer: Medicare HMO | Source: Ambulatory Visit | Attending: Neurosurgery | Admitting: Neurosurgery

## 2020-01-27 ENCOUNTER — Other Ambulatory Visit: Payer: Self-pay

## 2020-01-27 DIAGNOSIS — Z01818 Encounter for other preprocedural examination: Secondary | ICD-10-CM | POA: Insufficient documentation

## 2020-01-27 HISTORY — DX: Presence of spectacles and contact lenses: Z97.3

## 2020-01-27 HISTORY — DX: Spondylolisthesis, lumbar region: M43.16

## 2020-01-27 HISTORY — DX: Carpal tunnel syndrome, bilateral upper limbs: G56.03

## 2020-01-27 HISTORY — DX: Unspecified osteoarthritis, unspecified site: M19.90

## 2020-01-27 LAB — BASIC METABOLIC PANEL
Anion gap: 8 (ref 5–15)
BUN: 10 mg/dL (ref 6–20)
CO2: 27 mmol/L (ref 22–32)
Calcium: 9.5 mg/dL (ref 8.9–10.3)
Chloride: 104 mmol/L (ref 98–111)
Creatinine, Ser: 0.88 mg/dL (ref 0.44–1.00)
GFR calc Af Amer: >60 mL/min (ref >60)
GFR calc non Af Amer: >60 mL/min (ref >60)
Glucose, Bld: 116 mg/dL — ABNORMAL HIGH (ref 70–99)
Potassium: 3.7 mmol/L (ref 3.5–5.1)
Sodium: 139 mmol/L (ref 135–145)

## 2020-01-27 LAB — SURGICAL PCR SCREEN
MRSA, PCR: NEGATIVE
Staphylococcus aureus: NEGATIVE

## 2020-01-27 LAB — CBC
HCT: 36.9 % (ref 36.0–46.0)
Hemoglobin: 11.6 g/dL — ABNORMAL LOW (ref 12.0–15.0)
MCH: 25 pg — ABNORMAL LOW (ref 26.0–34.0)
MCHC: 31.4 g/dL (ref 30.0–36.0)
MCV: 79.5 fL — ABNORMAL LOW (ref 80.0–100.0)
Platelets: 305 10*3/uL (ref 150–400)
RBC: 4.64 MIL/uL (ref 3.87–5.11)
RDW: 14 % (ref 11.5–15.5)
WBC: 4.3 10*3/uL (ref 4.0–10.5)
nRBC: 0 % (ref 0.0–0.2)

## 2020-01-27 LAB — TYPE AND SCREEN
ABO/RH(D): A POS
Antibody Screen: NEGATIVE

## 2020-01-27 LAB — GLUCOSE, CAPILLARY: Glucose-Capillary: 112 mg/dL — ABNORMAL HIGH (ref 70–99)

## 2020-01-27 NOTE — Progress Notes (Signed)
Pt denies SOB, chest pain, and being under the care of a cardiologist. Pt stated that PCP is Dionisio David, NP. Pt denies having an EKG and chest x ray in the last year. Pt denies recent labs. Pt reminded to quarantine. Pt verbalized understanding of all pre-op instructions. Pt chart forwarded to PA, Anesthesiology, for review.

## 2020-01-28 NOTE — Progress Notes (Signed)
Anesthesia Chart Review:  Evaluated in 2016 for chest pain and ultimately had catheterization showing widely patent very tortuous coronary arteries with proximal LAD and first diagonal calcification of 40% and 50% lesions respectively.  No significant change compared to prior catheterization in 2013.  Normal left ventricular systolic function and hemodynamics.  EF at least 55%.  Echo 2018 showed EF 55 to 60%, normal wall motion, grade 2 diastolic dysfunction, mild MR. Chest pain at that time was felt to be noncardiac.  Not diagnosed with OSA but she did have a preop stop bang score of 6 and this result was sent to her PCP.  Preop clearance from PCP Dionisio David, NP states patient is high risk for surgery.  I reached out to [employer] for clarification of high risk designation.  She stated the high risk was just due to multiple comorbidities, no specific concerns, optimized for surgery from her standpoint.  DM2 well controlled, A1c 6.4 on 12/08/2019.  Labs reviewed, mild anemia hemoglobin 11.6.  Otherwise unremarkable.  EKG 01/27/2020: NSR.  Nonspecific T wave abnormality.  Rate 69.  TTE 07/25/2016: - Left ventricle: The cavity size was mildly dilated. There was  moderate concentric hypertrophy. Systolic function was normal.  The estimated ejection fraction was in the range of 55% to 60%.  Wall motion was normal; there were no regional wall motion  abnormalities. Features are consistent with a pseudonormal left  ventricular filling pattern, with concomitant abnormal relaxation  and increased filling pressure (grade 2 diastolic dysfunction).  - Mitral valve: Mild diffuse thickening of the anterior leaflet.  There was mild regurgitation.  - Pulmonary arteries: Systolic pressure could not be accurately  estimated.   LHC 06/04/2015: 1. Ost 1st Diag to 1st Diag lesion, 50% stenosed. 2. Ost LAD to Prox LAD lesion, 40% stenosed.    Widely patent very tortuous coronary arteries  with proximal LAD and first diagonal calcification and 40% and 50% lesions respectively. No significant change compared to prior.  Normal left ventricular systolic function and hemodynamics. EF at least 55%.  RECOMMENDATIONS:   Treat hypokalemia   Consider other diagnostic possibilities to explain the patient's chest discomfort.   Wynonia Musty [facility] Short Stay Center/Anesthesiology Phone [phone] 01/29/2020 9:13 AM

## 2020-01-29 DIAGNOSIS — M5136 Other intervertebral disc degeneration, lumbar region: Secondary | ICD-10-CM | POA: Diagnosis not present

## 2020-01-29 NOTE — Anesthesia Preprocedure Evaluation (Addendum)
Anesthesia Evaluation  Patient identified by MRN, date of birth, ID band Patient awake    Reviewed: Allergy & Precautions, NPO status , Patient's Chart, lab work & pertinent test results, reviewed documented beta blocker date and time   History of Anesthesia Complications Negative for: history of anesthetic complications  Airway Mallampati: II  TM Distance: >3 FB Neck ROM: Full    Dental  (+) Teeth Intact   Pulmonary neg pulmonary ROS,    Pulmonary exam normal        Cardiovascular hypertension, + CAD (nonobstructive) and +CHF  Normal cardiovascular exam     Neuro/Psych negative neurological ROS  negative psych ROS   GI/Hepatic Neg liver ROS, GERD  ,  Endo/Other  diabetes, Well Controlled, Type 2, Oral Hypoglycemic AgentsMorbid obesity  Renal/GU negative Renal ROS  negative genitourinary   Musculoskeletal  (+) Arthritis ,   Abdominal   Peds  Hematology  (+) anemia ,   Anesthesia Other Findings  EKG 01/27/2020: NSR.  Nonspecific T wave abnormality.  Rate 69.  TTE 07/25/2016: - Left ventricle: The cavity size was mildly dilated. There was  moderate concentric hypertrophy. Systolic function was normal.  The estimated ejection fraction was in the range of 55% to 60%.  Wall motion was normal; there were no regional wall motion  abnormalities. Features are consistent with a pseudonormal left  ventricular filling pattern, with concomitant abnormal relaxation  and increased filling pressure (grade 2 diastolic dysfunction).  - Mitral valve: Mild diffuse thickening of the anterior leaflet.  There was mild regurgitation.  - Pulmonary arteries: Systolic pressure could not be accurately  estimated.   LHC 06/04/2015: 1. Ost 1st Diag to 1st Diag lesion, 50% stenosed. 2. Ost LAD to Prox LAD lesion, 40% stenosed.  Widely patent very tortuous coronary arteries with proximal LAD and first diagonal  calcification and 40% and 50% lesions respectively. No significant change compared to prior.  Normal left ventricular systolic function and hemodynamics. EF at least 55%.  Reproductive/Obstetrics                           Anesthesia Physical Anesthesia Plan  ASA: III  Anesthesia Plan: General   Post-op Pain Management:    Induction: Intravenous  PONV Risk Score and Plan: 3 and Ondansetron, Dexamethasone, Treatment may vary due to age or medical condition and Midazolam  Airway Management Planned: Oral ETT  Additional Equipment: None  Intra-op Plan:   Post-operative Plan: Extubation in OR  Informed Consent: I have reviewed the patients History and Physical, chart, labs and discussed the procedure including the risks, benefits and alternatives for the proposed anesthesia with the patient or authorized representative who has indicated his/her understanding and acceptance.     Dental advisory given  Plan Discussed with:   Anesthesia Plan Comments: (PAT note by Karoline Caldwell, PA-C:  Evaluated in 2016 for chest pain and ultimately had catheterization showing widely patent very tortuous coronary arteries with proximal LAD and first diagonal calcification of 40% and 50% lesions respectively.  No significant change compared to prior catheterization in 2013.  Normal left ventricular systolic function and hemodynamics.  EF at least 55%.  Echo 2018 showed EF 55 to 60%, normal wall motion, grade 2 diastolic dysfunction, mild MR. Chest pain at that time was felt to be noncardiac.  Not diagnosed with OSA but she did have a preop stop bang score of 6 and this result was sent to her PCP.  Preop clearance  from PCP Dionisio David, NP states patient is high risk for surgery.  I reached out to [employer] for clarification of high risk designation.  She stated the high risk was just due to multiple comorbidities, no specific concerns, optimized for surgery from her  standpoint.  DM2 well controlled, A1c 6.4 on 12/08/2019.  Labs reviewed, mild anemia hemoglobin 11.6.  Otherwise unremarkable.  EKG 01/27/2020: NSR.  Nonspecific T wave abnormality.  Rate 69.  TTE 07/25/2016: - Left ventricle: The cavity size was mildly dilated. There was  moderate concentric hypertrophy. Systolic function was normal.  The estimated ejection fraction was in the range of 55% to 60%.  Wall motion was normal; there were no regional wall motion  abnormalities. Features are consistent with a pseudonormal left  ventricular filling pattern, with concomitant abnormal relaxation  and increased filling pressure (grade 2 diastolic dysfunction).  - Mitral valve: Mild diffuse thickening of the anterior leaflet.  There was mild regurgitation.  - Pulmonary arteries: Systolic pressure could not be accurately  estimated.   LHC 06/04/2015: Ost 1st Diag to 1st Diag lesion, 50% stenosed. Ost LAD to Prox LAD lesion, 40% stenosed.   Widely patent very tortuous coronary arteries with proximal LAD and first diagonal calcification and 40% and 50% lesions respectively. No significant change compared to prior. Normal left ventricular systolic function and hemodynamics. EF at least 55%.  RECOMMENDATIONS:  Treat hypokalemia  Consider other diagnostic possibilities to explain the patient's chest discomfort.  )       Anesthesia Quick Evaluation

## 2020-02-02 ENCOUNTER — Other Ambulatory Visit (HOSPITAL_COMMUNITY)
Admission: RE | Admit: 2020-02-02 | Discharge: 2020-02-02 | Disposition: A | Discharge status: Home / Self Care | Payer: Medicare HMO | Source: Ambulatory Visit | Attending: Neurosurgery | Admitting: Neurosurgery

## 2020-02-02 DIAGNOSIS — I2510 Atherosclerotic heart disease of native coronary artery without angina pectoris: Secondary | ICD-10-CM | POA: Diagnosis present

## 2020-02-02 DIAGNOSIS — M48062 Spinal stenosis, lumbar region with neurogenic claudication: Secondary | ICD-10-CM | POA: Diagnosis present

## 2020-02-02 DIAGNOSIS — Z6841 Body Mass Index (BMI) 40.0 and over, adult: Secondary | ICD-10-CM | POA: Diagnosis not present

## 2020-02-02 DIAGNOSIS — E119 Type 2 diabetes mellitus without complications: Secondary | ICD-10-CM | POA: Diagnosis present

## 2020-02-02 DIAGNOSIS — Z8249 Family history of ischemic heart disease and other diseases of the circulatory system: Secondary | ICD-10-CM | POA: Diagnosis not present

## 2020-02-02 DIAGNOSIS — Z01812 Encounter for preprocedural laboratory examination: Secondary | ICD-10-CM | POA: Insufficient documentation

## 2020-02-02 DIAGNOSIS — M1990 Unspecified osteoarthritis, unspecified site: Secondary | ICD-10-CM | POA: Diagnosis present

## 2020-02-02 DIAGNOSIS — E785 Hyperlipidemia, unspecified: Secondary | ICD-10-CM | POA: Diagnosis present

## 2020-02-02 DIAGNOSIS — Z803 Family history of malignant neoplasm of breast: Secondary | ICD-10-CM | POA: Diagnosis not present

## 2020-02-02 DIAGNOSIS — Z7982 Long term (current) use of aspirin: Secondary | ICD-10-CM | POA: Diagnosis not present

## 2020-02-02 DIAGNOSIS — M4326 Fusion of spine, lumbar region: Secondary | ICD-10-CM | POA: Diagnosis not present

## 2020-02-02 DIAGNOSIS — I110 Hypertensive heart disease with heart failure: Secondary | ICD-10-CM | POA: Diagnosis present

## 2020-02-02 DIAGNOSIS — Z20822 Contact with and (suspected) exposure to covid-19: Secondary | ICD-10-CM | POA: Diagnosis present

## 2020-02-02 DIAGNOSIS — Z833 Family history of diabetes mellitus: Secondary | ICD-10-CM | POA: Diagnosis not present

## 2020-02-02 DIAGNOSIS — Z79899 Other long term (current) drug therapy: Secondary | ICD-10-CM | POA: Diagnosis not present

## 2020-02-02 DIAGNOSIS — M4316 Spondylolisthesis, lumbar region: Secondary | ICD-10-CM | POA: Diagnosis present

## 2020-02-02 DIAGNOSIS — M5116 Intervertebral disc disorders with radiculopathy, lumbar region: Secondary | ICD-10-CM | POA: Diagnosis present

## 2020-02-02 DIAGNOSIS — K219 Gastro-esophageal reflux disease without esophagitis: Secondary | ICD-10-CM | POA: Diagnosis present

## 2020-02-02 DIAGNOSIS — D573 Sickle-cell trait: Secondary | ICD-10-CM | POA: Diagnosis present

## 2020-02-02 DIAGNOSIS — I509 Heart failure, unspecified: Secondary | ICD-10-CM | POA: Diagnosis present

## 2020-02-02 DIAGNOSIS — Z7984 Long term (current) use of oral hypoglycemic drugs: Secondary | ICD-10-CM | POA: Diagnosis not present

## 2020-02-02 DIAGNOSIS — Z800 Family history of malignant neoplasm of digestive organs: Secondary | ICD-10-CM | POA: Diagnosis not present

## 2020-02-02 DIAGNOSIS — Z823 Family history of stroke: Secondary | ICD-10-CM | POA: Diagnosis not present

## 2020-02-02 DIAGNOSIS — Z8371 Family history of colonic polyps: Secondary | ICD-10-CM | POA: Diagnosis not present

## 2020-02-02 LAB — SARS CORONAVIRUS 2 (TAT 6-24 HRS): SARS Coronavirus 2: NEGATIVE

## 2020-02-04 MED ORDER — DEXTROSE 5 % IV SOLN
3.0000 g | INTRAVENOUS | Status: AC
  Administered 2020-02-05 (×2): 3 g via INTRAVENOUS
  Filled 2020-02-04: qty 3

## 2020-02-05 ENCOUNTER — Inpatient Hospital Stay (HOSPITAL_COMMUNITY): Payer: Medicare HMO

## 2020-02-05 ENCOUNTER — Inpatient Hospital Stay (HOSPITAL_COMMUNITY): Payer: Medicare HMO | Admitting: Physician Assistant

## 2020-02-05 ENCOUNTER — Encounter (HOSPITAL_COMMUNITY): Payer: Self-pay | Admitting: Neurosurgery

## 2020-02-05 ENCOUNTER — Inpatient Hospital Stay (HOSPITAL_COMMUNITY)
Admission: RE | Admit: 2020-02-05 | Discharge: 2020-02-07 | DRG: 454 | Disposition: A | Discharge status: Home Health | Payer: Medicare HMO | Source: Ambulatory Visit | Attending: Neurosurgery | Admitting: Neurosurgery

## 2020-02-05 ENCOUNTER — Encounter (HOSPITAL_COMMUNITY)
Admission: RE | Disposition: A | Discharge status: Home / Self Care | Payer: Self-pay | Source: Ambulatory Visit | Attending: Neurosurgery

## 2020-02-05 ENCOUNTER — Other Ambulatory Visit: Payer: Self-pay

## 2020-02-05 ENCOUNTER — Inpatient Hospital Stay (HOSPITAL_COMMUNITY): Payer: Medicare HMO | Admitting: Certified Registered Nurse Anesthetist

## 2020-02-05 DIAGNOSIS — I2510 Atherosclerotic heart disease of native coronary artery without angina pectoris: Secondary | ICD-10-CM | POA: Diagnosis present

## 2020-02-05 DIAGNOSIS — K219 Gastro-esophageal reflux disease without esophagitis: Secondary | ICD-10-CM | POA: Diagnosis present

## 2020-02-05 DIAGNOSIS — M4316 Spondylolisthesis, lumbar region: Secondary | ICD-10-CM | POA: Diagnosis present

## 2020-02-05 DIAGNOSIS — M5116 Intervertebral disc disorders with radiculopathy, lumbar region: Secondary | ICD-10-CM | POA: Diagnosis present

## 2020-02-05 DIAGNOSIS — Z823 Family history of stroke: Secondary | ICD-10-CM

## 2020-02-05 DIAGNOSIS — M48062 Spinal stenosis, lumbar region with neurogenic claudication: Principal | ICD-10-CM | POA: Diagnosis present

## 2020-02-05 DIAGNOSIS — Z803 Family history of malignant neoplasm of breast: Secondary | ICD-10-CM

## 2020-02-05 DIAGNOSIS — Z7982 Long term (current) use of aspirin: Secondary | ICD-10-CM

## 2020-02-05 DIAGNOSIS — Z79899 Other long term (current) drug therapy: Secondary | ICD-10-CM | POA: Diagnosis not present

## 2020-02-05 DIAGNOSIS — Z800 Family history of malignant neoplasm of digestive organs: Secondary | ICD-10-CM | POA: Diagnosis not present

## 2020-02-05 DIAGNOSIS — M1990 Unspecified osteoarthritis, unspecified site: Secondary | ICD-10-CM | POA: Diagnosis present

## 2020-02-05 DIAGNOSIS — Z7984 Long term (current) use of oral hypoglycemic drugs: Secondary | ICD-10-CM | POA: Diagnosis not present

## 2020-02-05 DIAGNOSIS — Z8249 Family history of ischemic heart disease and other diseases of the circulatory system: Secondary | ICD-10-CM | POA: Diagnosis not present

## 2020-02-05 DIAGNOSIS — M4326 Fusion of spine, lumbar region: Secondary | ICD-10-CM | POA: Diagnosis not present

## 2020-02-05 DIAGNOSIS — Z20822 Contact with and (suspected) exposure to covid-19: Secondary | ICD-10-CM | POA: Diagnosis present

## 2020-02-05 DIAGNOSIS — D573 Sickle-cell trait: Secondary | ICD-10-CM | POA: Diagnosis present

## 2020-02-05 DIAGNOSIS — E785 Hyperlipidemia, unspecified: Secondary | ICD-10-CM | POA: Diagnosis present

## 2020-02-05 DIAGNOSIS — Z6841 Body Mass Index (BMI) 40.0 and over, adult: Secondary | ICD-10-CM

## 2020-02-05 DIAGNOSIS — Z8371 Family history of colonic polyps: Secondary | ICD-10-CM | POA: Diagnosis not present

## 2020-02-05 DIAGNOSIS — I509 Heart failure, unspecified: Secondary | ICD-10-CM | POA: Diagnosis present

## 2020-02-05 DIAGNOSIS — I110 Hypertensive heart disease with heart failure: Secondary | ICD-10-CM | POA: Diagnosis present

## 2020-02-05 DIAGNOSIS — E119 Type 2 diabetes mellitus without complications: Secondary | ICD-10-CM | POA: Diagnosis present

## 2020-02-05 DIAGNOSIS — Z419 Encounter for procedure for purposes other than remedying health state, unspecified: Secondary | ICD-10-CM

## 2020-02-05 DIAGNOSIS — Z833 Family history of diabetes mellitus: Secondary | ICD-10-CM

## 2020-02-05 LAB — HEMOGLOBIN A1C
Hgb A1c MFr Bld: 6.5 % — ABNORMAL HIGH (ref 4.8–5.6)
Mean Plasma Glucose: 139.85 mg/dL

## 2020-02-05 LAB — GLUCOSE, CAPILLARY
Glucose-Capillary: 109 mg/dL — ABNORMAL HIGH (ref 70–99)
Glucose-Capillary: 150 mg/dL — ABNORMAL HIGH (ref 70–99)
Glucose-Capillary: 134 mg/dL — ABNORMAL HIGH (ref 70–99)
Glucose-Capillary: 159 mg/dL — ABNORMAL HIGH (ref 70–99)

## 2020-02-05 SURGERY — POSTERIOR LUMBAR FUSION 2 LEVEL
Anesthesia: General | Site: Spine Lumbar

## 2020-02-05 MED ORDER — BACITRACIN ZINC 500 UNIT/GM EX OINT
TOPICAL_OINTMENT | CUTANEOUS | Status: DC | PRN
  Administered 2020-02-05: 1 via TOPICAL

## 2020-02-05 MED ORDER — ONDANSETRON HCL 4 MG/2ML IJ SOLN
INTRAMUSCULAR | Status: AC
  Filled 2020-02-05: qty 2

## 2020-02-05 MED ORDER — THROMBIN 5000 UNITS EX SOLR
OROMUCOSAL | Status: DC | PRN
  Administered 2020-02-05: 5 mL via TOPICAL

## 2020-02-05 MED ORDER — FENTANYL CITRATE (PF) 250 MCG/5ML IJ SOLN
INTRAMUSCULAR | Status: AC
  Filled 2020-02-05: qty 5

## 2020-02-05 MED ORDER — ONDANSETRON HCL 4 MG/2ML IJ SOLN
INTRAMUSCULAR | Status: DC | PRN
  Administered 2020-02-05 (×2): 4 mg via INTRAVENOUS

## 2020-02-05 MED ORDER — LIDOCAINE 2% (20 MG/ML) 5 ML SYRINGE
INTRAMUSCULAR | Status: AC
  Filled 2020-02-05: qty 5

## 2020-02-05 MED ORDER — MIDAZOLAM HCL 2 MG/2ML IJ SOLN
INTRAMUSCULAR | Status: DC | PRN
  Administered 2020-02-05: 2 mg via INTRAVENOUS

## 2020-02-05 MED ORDER — DOCUSATE SODIUM 100 MG PO CAPS
100.0000 mg | ORAL_CAPSULE | Freq: Two times a day (BID) | ORAL | Status: DC
  Administered 2020-02-05 – 2020-02-06 (×3): 100 mg via ORAL
  Filled 2020-02-05 (×3): qty 1

## 2020-02-05 MED ORDER — ATORVASTATIN CALCIUM 40 MG PO TABS
40.0000 mg | ORAL_TABLET | Freq: Every day | ORAL | Status: DC
  Administered 2020-02-05 – 2020-02-06 (×2): 40 mg via ORAL
  Filled 2020-02-05 (×2): qty 1

## 2020-02-05 MED ORDER — CEFAZOLIN SODIUM 1 G IJ SOLR
INTRAMUSCULAR | Status: AC
  Filled 2020-02-05: qty 30

## 2020-02-05 MED ORDER — ACETAMINOPHEN 325 MG PO TABS: 650.0000 mg | ORAL_TABLET | ORAL | Status: DC | PRN

## 2020-02-05 MED ORDER — ONDANSETRON HCL 4 MG PO TABS: 4.0000 mg | ORAL_TABLET | Freq: Four times a day (QID) | ORAL | Status: DC | PRN

## 2020-02-05 MED ORDER — EPHEDRINE 5 MG/ML INJ
INTRAVENOUS | Status: AC
  Filled 2020-02-05: qty 10

## 2020-02-05 MED ORDER — CEFAZOLIN SODIUM-DEXTROSE 2-4 GM/100ML-% IV SOLN
2.0000 g | Freq: Three times a day (TID) | INTRAVENOUS | Status: AC
  Administered 2020-02-05 – 2020-02-06 (×2): 2 g via INTRAVENOUS
  Filled 2020-02-05 (×2): qty 100

## 2020-02-05 MED ORDER — BACITRACIN ZINC 500 UNIT/GM EX OINT
TOPICAL_OINTMENT | CUTANEOUS | Status: AC
  Filled 2020-02-05: qty 28.35

## 2020-02-05 MED ORDER — BUPIVACAINE-EPINEPHRINE (PF) 0.5% -1:200000 IJ SOLN
INTRAMUSCULAR | Status: DC | PRN
  Administered 2020-02-05: 10 mL

## 2020-02-05 MED ORDER — INSULIN ASPART 100 UNIT/ML ~~LOC~~ SOLN
0.0000 [IU] | SUBCUTANEOUS | Status: DC
  Administered 2020-02-05: 3 [IU] via SUBCUTANEOUS

## 2020-02-05 MED ORDER — HYDROMORPHONE HCL 1 MG/ML IJ SOLN
0.2500 mg | INTRAMUSCULAR | Status: DC | PRN
  Administered 2020-02-05: 1 mg via INTRAVENOUS

## 2020-02-05 MED ORDER — ORAL CARE MOUTH RINSE: 15.0000 mL | Freq: Once | OROMUCOSAL | Status: AC

## 2020-02-05 MED ORDER — OXYCODONE HCL 5 MG PO TABS: 5.0000 mg | ORAL_TABLET | ORAL | Status: DC | PRN

## 2020-02-05 MED ORDER — PHENOL 1.4 % MT LIQD: 1.0000 | OROMUCOSAL | Status: DC | PRN

## 2020-02-05 MED ORDER — CARVEDILOL 6.25 MG PO TABS
6.2500 mg | ORAL_TABLET | Freq: Two times a day (BID) | ORAL | Status: DC
  Administered 2020-02-05 – 2020-02-07 (×4): 6.25 mg via ORAL
  Filled 2020-02-05 (×4): qty 1

## 2020-02-05 MED ORDER — ONDANSETRON HCL 4 MG/2ML IJ SOLN: 4.0000 mg | Freq: Once | INTRAMUSCULAR | Status: DC | PRN

## 2020-02-05 MED ORDER — SODIUM CHLORIDE 0.9 % IV SOLN: 250.0000 mL | INTRAVENOUS | Status: DC

## 2020-02-05 MED ORDER — ROCURONIUM BROMIDE 10 MG/ML (PF) SYRINGE
PREFILLED_SYRINGE | INTRAVENOUS | Status: DC | PRN
  Administered 2020-02-05: 20 mg via INTRAVENOUS
  Administered 2020-02-05: 30 mg via INTRAVENOUS
  Administered 2020-02-05: 20 mg via INTRAVENOUS
  Administered 2020-02-05: 100 mg via INTRAVENOUS

## 2020-02-05 MED ORDER — BUPIVACAINE LIPOSOME 1.3 % IJ SUSP
20.0000 mL | Freq: Once | INTRAMUSCULAR | Status: DC
  Filled 2020-02-05: qty 20

## 2020-02-05 MED ORDER — MORPHINE SULFATE (PF) 4 MG/ML IV SOLN
4.0000 mg | INTRAVENOUS | Status: DC | PRN
  Administered 2020-02-05 – 2020-02-06 (×2): 4 mg via INTRAVENOUS
  Filled 2020-02-05 (×2): qty 1

## 2020-02-05 MED ORDER — DEXAMETHASONE SODIUM PHOSPHATE 10 MG/ML IJ SOLN
INTRAMUSCULAR | Status: DC | PRN
  Administered 2020-02-05: 10 mg via INTRAVENOUS

## 2020-02-05 MED ORDER — ROCURONIUM BROMIDE 10 MG/ML (PF) SYRINGE
PREFILLED_SYRINGE | INTRAVENOUS | Status: AC
  Filled 2020-02-05: qty 10

## 2020-02-05 MED ORDER — BUPIVACAINE-EPINEPHRINE 0.5% -1:200000 IJ SOLN
INTRAMUSCULAR | Status: AC
  Filled 2020-02-05: qty 1

## 2020-02-05 MED ORDER — AMLODIPINE BESYLATE 5 MG PO TABS: 5.0000 mg | ORAL_TABLET | Freq: Every day | ORAL | Status: DC

## 2020-02-05 MED ORDER — MIDAZOLAM HCL 2 MG/2ML IJ SOLN
INTRAMUSCULAR | Status: AC
  Filled 2020-02-05: qty 2

## 2020-02-05 MED ORDER — SODIUM CHLORIDE 0.9 % IV SOLN
INTRAVENOUS | Status: DC | PRN
  Administered 2020-02-05: 500 mL

## 2020-02-05 MED ORDER — ONDANSETRON HCL 4 MG/2ML IJ SOLN: 4.0000 mg | Freq: Four times a day (QID) | INTRAMUSCULAR | Status: DC | PRN

## 2020-02-05 MED ORDER — OXYCODONE HCL 5 MG PO TABS
ORAL_TABLET | ORAL | Status: AC
  Filled 2020-02-05: qty 2

## 2020-02-05 MED ORDER — CHLORHEXIDINE GLUCONATE 0.12 % MT SOLN
15.0000 mL | Freq: Once | OROMUCOSAL | Status: AC
  Administered 2020-02-05: 15 mL via OROMUCOSAL
  Filled 2020-02-05: qty 15

## 2020-02-05 MED ORDER — ACETAMINOPHEN 650 MG RE SUPP: 650.0000 mg | RECTAL | Status: DC | PRN

## 2020-02-05 MED ORDER — LIDOCAINE 2% (20 MG/ML) 5 ML SYRINGE
INTRAMUSCULAR | Status: DC | PRN
  Administered 2020-02-05: 100 mg via INTRAVENOUS

## 2020-02-05 MED ORDER — PROPOFOL 10 MG/ML IV BOLUS
INTRAVENOUS | Status: AC
  Filled 2020-02-05: qty 40

## 2020-02-05 MED ORDER — SUGAMMADEX SODIUM 200 MG/2ML IV SOLN
INTRAVENOUS | Status: DC | PRN
  Administered 2020-02-05: 300 mg via INTRAVENOUS

## 2020-02-05 MED ORDER — EPHEDRINE SULFATE-NACL 50-0.9 MG/10ML-% IV SOSY
PREFILLED_SYRINGE | INTRAVENOUS | Status: DC | PRN
  Administered 2020-02-05 (×2): 15 mg via INTRAVENOUS
  Administered 2020-02-05: 5 mg via INTRAVENOUS

## 2020-02-05 MED ORDER — SUGAMMADEX SODIUM 500 MG/5ML IV SOLN
INTRAVENOUS | Status: AC
  Filled 2020-02-05: qty 5

## 2020-02-05 MED ORDER — FENTANYL CITRATE (PF) 100 MCG/2ML IJ SOLN
INTRAMUSCULAR | Status: DC | PRN
  Administered 2020-02-05: 50 ug via INTRAVENOUS
  Administered 2020-02-05: 25 ug via INTRAVENOUS
  Administered 2020-02-05 (×5): 50 ug via INTRAVENOUS

## 2020-02-05 MED ORDER — CYCLOBENZAPRINE HCL 10 MG PO TABS
10.0000 mg | ORAL_TABLET | Freq: Three times a day (TID) | ORAL | Status: DC | PRN
  Administered 2020-02-05 – 2020-02-07 (×4): 10 mg via ORAL
  Filled 2020-02-05 (×3): qty 1

## 2020-02-05 MED ORDER — BUPIVACAINE LIPOSOME 1.3 % IJ SUSP
INTRAMUSCULAR | Status: DC | PRN
  Administered 2020-02-05: 20 mL

## 2020-02-05 MED ORDER — HYDROMORPHONE HCL 1 MG/ML IJ SOLN
INTRAMUSCULAR | Status: AC
  Filled 2020-02-05: qty 1

## 2020-02-05 MED ORDER — BISACODYL 10 MG RE SUPP: 10.0000 mg | Freq: Every day | RECTAL | Status: DC | PRN

## 2020-02-05 MED ORDER — SODIUM CHLORIDE 0.9% FLUSH
3.0000 mL | Freq: Two times a day (BID) | INTRAVENOUS | Status: DC
  Administered 2020-02-05 – 2020-02-06 (×3): 3 mL via INTRAVENOUS

## 2020-02-05 MED ORDER — INSULIN ASPART 100 UNIT/ML ~~LOC~~ SOLN
0.0000 [IU] | Freq: Three times a day (TID) | SUBCUTANEOUS | Status: DC
  Administered 2020-02-06 – 2020-02-07 (×2): 3 [IU] via SUBCUTANEOUS

## 2020-02-05 MED ORDER — CHLORHEXIDINE GLUCONATE CLOTH 2 % EX PADS: 6.0000 | MEDICATED_PAD | Freq: Once | CUTANEOUS | Status: DC

## 2020-02-05 MED ORDER — LACTATED RINGERS IV SOLN: INTRAVENOUS | Status: DC

## 2020-02-05 MED ORDER — PHENYLEPHRINE 40 MCG/ML (10ML) SYRINGE FOR IV PUSH (FOR BLOOD PRESSURE SUPPORT)
PREFILLED_SYRINGE | INTRAVENOUS | Status: AC
  Filled 2020-02-05: qty 10

## 2020-02-05 MED ORDER — ZOLPIDEM TARTRATE 5 MG PO TABS: 5.0000 mg | ORAL_TABLET | Freq: Every evening | ORAL | Status: DC | PRN

## 2020-02-05 MED ORDER — ACETAMINOPHEN 500 MG PO TABS
1000.0000 mg | ORAL_TABLET | Freq: Four times a day (QID) | ORAL | Status: AC
  Administered 2020-02-05 – 2020-02-06 (×3): 1000 mg via ORAL
  Filled 2020-02-05 (×3): qty 2

## 2020-02-05 MED ORDER — METFORMIN HCL 500 MG PO TABS
500.0000 mg | ORAL_TABLET | Freq: Two times a day (BID) | ORAL | Status: DC
  Administered 2020-02-05 – 2020-02-07 (×4): 500 mg via ORAL
  Filled 2020-02-05 (×4): qty 1

## 2020-02-05 MED ORDER — MENTHOL 3 MG MT LOZG: 1.0000 | LOZENGE | OROMUCOSAL | Status: DC | PRN

## 2020-02-05 MED ORDER — OXYCODONE HCL 5 MG/5ML PO SOLN: 5.0000 mg | Freq: Once | ORAL | Status: DC | PRN

## 2020-02-05 MED ORDER — SPIRONOLACTONE 25 MG PO TABS
25.0000 mg | ORAL_TABLET | Freq: Every day | ORAL | Status: DC
  Filled 2020-02-05 (×3): qty 1

## 2020-02-05 MED ORDER — CYCLOBENZAPRINE HCL 10 MG PO TABS
ORAL_TABLET | ORAL | Status: AC
  Filled 2020-02-05: qty 1

## 2020-02-05 MED ORDER — OXYCODONE HCL 5 MG PO TABS: 5.0000 mg | ORAL_TABLET | Freq: Once | ORAL | Status: DC | PRN

## 2020-02-05 MED ORDER — DEXAMETHASONE SODIUM PHOSPHATE 10 MG/ML IJ SOLN
INTRAMUSCULAR | Status: AC
  Filled 2020-02-05: qty 1

## 2020-02-05 MED ORDER — PHENYLEPHRINE 40 MCG/ML (10ML) SYRINGE FOR IV PUSH (FOR BLOOD PRESSURE SUPPORT)
PREFILLED_SYRINGE | INTRAVENOUS | Status: DC | PRN
  Administered 2020-02-05 (×2): 120 ug via INTRAVENOUS
  Administered 2020-02-05 (×2): 80 ug via INTRAVENOUS

## 2020-02-05 MED ORDER — OXYCODONE HCL 5 MG PO TABS
10.0000 mg | ORAL_TABLET | ORAL | Status: DC | PRN
  Administered 2020-02-05 – 2020-02-07 (×11): 10 mg via ORAL
  Filled 2020-02-05 (×10): qty 2

## 2020-02-05 MED ORDER — 0.9 % SODIUM CHLORIDE (POUR BTL) OPTIME
TOPICAL | Status: DC | PRN
  Administered 2020-02-05: 1000 mL

## 2020-02-05 MED ORDER — PROPOFOL 10 MG/ML IV BOLUS
INTRAVENOUS | Status: DC | PRN
  Administered 2020-02-05: 200 mg via INTRAVENOUS

## 2020-02-05 MED ORDER — PHENYLEPHRINE HCL-NACL 10-0.9 MG/250ML-% IV SOLN
INTRAVENOUS | Status: DC | PRN
  Administered 2020-02-05: 50 ug/min via INTRAVENOUS

## 2020-02-05 MED ORDER — SODIUM CHLORIDE 0.9% FLUSH: 3.0000 mL | INTRAVENOUS | Status: DC | PRN

## 2020-02-05 MED ORDER — THROMBIN 5000 UNITS EX SOLR
CUTANEOUS | Status: AC
  Filled 2020-02-05: qty 5000

## 2020-02-05 SURGICAL SUPPLY — 73 items
BAG DECANTER FOR FLEXI CONT (MISCELLANEOUS) ×3 IMPLANT
BENZOIN TINCTURE PRP APPL 2/3 (GAUZE/BANDAGES/DRESSINGS) ×3 IMPLANT
BLADE CLIPPER SURG (BLADE) IMPLANT
BUR MATCHSTICK NEURO 3.0 LAGG (BURR) ×3 IMPLANT
BUR PRECISION FLUTE 6.0 (BURR) ×3 IMPLANT
CAGE ALTERA 10X31MM-10-14-15 (Cage) ×1 IMPLANT
CAGE ALTERA 10X31X10-14 15D (Cage) ×2 IMPLANT
CAGE ALTERA 10X31X9-13 15D (Cage) ×2 IMPLANT
CAGE ALTERA 9-13-15-31MM (Cage) ×1 IMPLANT
CANISTER SUCT 3000ML PPV (MISCELLANEOUS) ×3 IMPLANT
CARTRIDGE OIL MAESTRO DRILL (MISCELLANEOUS) ×1 IMPLANT
CLOSURE WOUND 1/2 X4 (GAUZE/BANDAGES/DRESSINGS) ×1
CNTNR URN SCR LID CUP LEK RST (MISCELLANEOUS) ×1 IMPLANT
CONT SPEC 4OZ STRL OR WHT (MISCELLANEOUS) ×3
COVER BACK TABLE 60X90IN (DRAPES) ×3 IMPLANT
COVER WAND RF STERILE (DRAPES) IMPLANT
DECANTER SPIKE VIAL GLASS SM (MISCELLANEOUS) ×3 IMPLANT
DIFFUSER DRILL AIR PNEUMATIC (MISCELLANEOUS) ×3 IMPLANT
DRAPE C-ARM 42X72 X-RAY (DRAPES) ×6 IMPLANT
DRAPE HALF SHEET 40X57 (DRAPES) ×3 IMPLANT
DRAPE LAPAROTOMY 100X72X124 (DRAPES) ×3 IMPLANT
DRAPE SURG 17X23 STRL (DRAPES) ×12 IMPLANT
DRSG OPSITE POSTOP 4X6 (GAUZE/BANDAGES/DRESSINGS) ×3 IMPLANT
DRSG OPSITE POSTOP 4X8 (GAUZE/BANDAGES/DRESSINGS) ×3 IMPLANT
ELECT BLADE 4.0 EZ CLEAN MEGAD (MISCELLANEOUS) ×3
ELECT REM PT RETURN 9FT ADLT (ELECTROSURGICAL) ×3
ELECTRODE BLDE 4.0 EZ CLN MEGD (MISCELLANEOUS) ×1 IMPLANT
ELECTRODE REM PT RTRN 9FT ADLT (ELECTROSURGICAL) ×1 IMPLANT
GAUZE 4X4 16PLY RFD (DISPOSABLE) IMPLANT
GAUZE SPONGE 4X4 12PLY STRL (GAUZE/BANDAGES/DRESSINGS) IMPLANT
GLOVE BIO SURGEON STRL SZ 6.5 (GLOVE) ×10 IMPLANT
GLOVE BIO SURGEON STRL SZ7 (GLOVE) ×6 IMPLANT
GLOVE BIO SURGEON STRL SZ8 (GLOVE) ×12 IMPLANT
GLOVE BIO SURGEON STRL SZ8.5 (GLOVE) ×6 IMPLANT
GLOVE BIO SURGEONS STRL SZ 6.5 (GLOVE) ×5
GLOVE BIOGEL PI IND STRL 6.5 (GLOVE) ×2 IMPLANT
GLOVE BIOGEL PI IND STRL 8 (GLOVE) ×3 IMPLANT
GLOVE BIOGEL PI INDICATOR 6.5 (GLOVE) ×4
GLOVE BIOGEL PI INDICATOR 8 (GLOVE) ×6
GLOVE EXAM NITRILE XL STR (GLOVE) IMPLANT
GOWN STRL REUS W/ TWL LRG LVL3 (GOWN DISPOSABLE) ×3 IMPLANT
GOWN STRL REUS W/ TWL XL LVL3 (GOWN DISPOSABLE) ×3 IMPLANT
GOWN STRL REUS W/TWL 2XL LVL3 (GOWN DISPOSABLE) ×3 IMPLANT
GOWN STRL REUS W/TWL LRG LVL3 (GOWN DISPOSABLE) ×9
GOWN STRL REUS W/TWL XL LVL3 (GOWN DISPOSABLE) ×9
HEMOSTAT POWDER KIT SURGIFOAM (HEMOSTASIS) ×3 IMPLANT
KIT BASIN OR (CUSTOM PROCEDURE TRAY) ×3 IMPLANT
KIT TURNOVER KIT B (KITS) ×3 IMPLANT
MILL MEDIUM DISP (BLADE) IMPLANT
NEEDLE HYPO 21X1.5 SAFETY (NEEDLE) ×3 IMPLANT
NEEDLE HYPO 22GX1.5 SAFETY (NEEDLE) ×3 IMPLANT
NS IRRIG 1000ML POUR BTL (IV SOLUTION) ×3 IMPLANT
OIL CARTRIDGE MAESTRO DRILL (MISCELLANEOUS) ×3
PACK LAMINECTOMY NEURO (CUSTOM PROCEDURE TRAY) ×3 IMPLANT
PAD ARMBOARD 7.5X6 YLW CONV (MISCELLANEOUS) ×24 IMPLANT
PATTIES SURGICAL .5 X1 (DISPOSABLE) IMPLANT
PUTTY DBM 10CC CALC GRAN (Putty) ×3 IMPLANT
PUTTY DBM 5CC CALC GRAN ×3 IMPLANT
ROD L635 90MM PREBENT (Rod) ×6 IMPLANT
SCREW PEDICLE VA L635 7.5X50M (Screw) ×12 IMPLANT
SCREW SET BREAK OFF (Screw) ×30 IMPLANT
SPONGE LAP 4X18 RFD (DISPOSABLE) IMPLANT
SPONGE NEURO XRAY DETECT 1X3 (DISPOSABLE) IMPLANT
SPONGE SURGIFOAM ABS GEL 100 (HEMOSTASIS) IMPLANT
STRIP CLOSURE SKIN 1/2X4 (GAUZE/BANDAGES/DRESSINGS) ×2 IMPLANT
SUT VIC AB 1 CT1 18XBRD ANBCTR (SUTURE) ×2 IMPLANT
SUT VIC AB 1 CT1 8-18 (SUTURE) ×6
SUT VIC AB 2-0 CP2 18 (SUTURE) ×6 IMPLANT
SYR 20ML LL LF (SYRINGE) ×3 IMPLANT
TOWEL GREEN STERILE (TOWEL DISPOSABLE) ×3 IMPLANT
TOWEL GREEN STERILE FF (TOWEL DISPOSABLE) ×3 IMPLANT
TRAY FOLEY MTR SLVR 16FR STAT (SET/KITS/TRAYS/PACK) ×3 IMPLANT
WATER STERILE IRR 1000ML POUR (IV SOLUTION) ×3 IMPLANT

## 2020-02-05 NOTE — Evaluation (Signed)
Physical Therapy Evaluation Patient Details Name: Hannah Huerta MRN: [id number] DOB: 1961-06-10 Today's Date: 02/05/2020   History of Present Illness  Hannah Huerta is a 59 yo female presenting s/p PLIF L2-3 and L3-4 7/22 due to chronic spinal stenosis with radiculopathy and neurogenic claudication. PMH includes L4-5 decompression and fusion, CAD, CHF, DM II, HLD, HTN, morbid obesity.  Clinical Impression  Pt in bed upon arrival of PT, agreeable to evaluation at this time. Prior to admission the pt was independent without use of AD with mobility and ADLs, but limited by pain in her back and RLE. The pt now presents with limitations in functional mobility, strength, and dynamic stability due to above dx, and will continue to benefit from skilled PT to address these deficits. The pt was able to demo good hallway ambulation during first time up with PT, no LOB with use of RW. The pt continues to demo RLE drop foot (pt reports she has had this for >1 year without brace or AFO and may benefit from one of these in future), but was able to ambulate without LOB by using compensatory strategies. The pt will continue to benefit from skilled PT to improve functional strength and stability to facilitate safe return to ambulation without use of AD     Follow Up Recommendations Follow surgeon's recommendation for DC plan and follow-up therapies;Home health PT    Equipment Recommendations  Other (comment) (bariatric RW (new) vs cane (pt has at home); tub bench)    Recommendations for Other Services       Precautions / Restrictions Precautions Precautions: Back Precaution Booklet Issued: Yes (comment) Precaution Comments: verbally reviewed, pt able to recall 3/3 at start and end of session Required Braces or Orthoses: Spinal Brace Spinal Brace: Lumbar corset;Applied in sitting position Restrictions Weight Bearing Restrictions: No      Mobility  Bed Mobility Overal bed mobility: Needs Assistance Bed  Mobility: Rolling;Sidelying to Sit;Sit to Sidelying Rolling: Supervision Sidelying to sit: Supervision;HOB elevated     Sit to sidelying: Supervision;HOB elevated General bed mobility comments: supervision for log roll, no assist needed. pt with heavy use of bed rails  Transfers Overall transfer level: Needs assistance Equipment used: Rolling walker (2 wheeled) Transfers: Sit to/from Stand Sit to Stand: Min guard         General transfer comment: pt able to reposition at EOB and stand from EOB with VC for hand positioning but no assist  Ambulation/Gait Ambulation/Gait assistance: Min guard Gait Distance (Feet): 300 Feet Assistive device: Rolling walker (2 wheeled) Gait Pattern/deviations: Step-through pattern;Decreased dorsiflexion - right;Decreased stride length Gait velocity: 0.4 m/s Gait velocity interpretation: <1.31 ft/sec, indicative of household ambulator General Gait Details: pt with good endurance on day 1, continues to have R drop foot (pt reports she has had this for >1 year, no brace). no LOB      Balance Overall balance assessment: Mild deficits observed, not formally tested                                           Pertinent Vitals/Pain Pain Assessment: 0-10 Pain Score: 5  Pain Location: low back, surgical site Pain Descriptors / Indicators: Grimacing;Sore Pain Intervention(s): Limited activity within patient's tolerance;Monitored during session;Repositioned;RN gave pain meds during session    [place] expects to be discharged to:: Private residence Living Arrangements: Alone (pt reports "my sons might as  well live there, they come all the time") Available Help at Discharge: Family (24/7 for friday-sunday, then sons to assist before and after work) Type of Home: Apartment Home Access: Stairs to enter Entrance Stairs-Rails: None Entrance Stairs-Number of Steps: 1 Home Layout: One level Home Equipment: Cane - single  point;Cane - quad;Grab bars - tub/shower      Prior Function Level of Independence: Independent         Comments: independent but limited in activity tolerance due to onset of pain     Hand Dominance        Extremity/Trunk Assessment   Upper Extremity Assessment Upper Extremity Assessment: Overall WFL for tasks assessed    Lower Extremity Assessment Lower Extremity Assessment: RLE deficits/detail RLE Deficits / Details: knee ext 4/5, 1/5 ankle df (pt reports she has has this dropfoot for >1 year) RLE Sensation: WNL    Cervical / Trunk Assessment Cervical / Trunk Assessment:  (spine surgery)  Communication   Communication: No difficulties  Cognition Arousal/Alertness: Awake/alert Behavior During Therapy: WFL for tasks assessed/performed Overall Cognitive Status: Within Functional Limits for tasks assessed                                        General Comments General comments (skin integrity, edema, etc.): incision intact and covered, pt educated on spinal precautions and able to demo good recall from surgery 12 years ago        Assessment/Plan    PT Assessment Patient needs continued PT services  PT Problem List Decreased strength;Decreased mobility;Decreased activity tolerance;Decreased balance;Pain       PT Treatment Interventions DME instruction;Therapeutic exercise;Gait training;Stair training;Functional mobility training;Therapeutic activities;Patient/family education;Cognitive remediation;Balance training    PT Goals (Current goals can be found in the Care Plan section)  Acute Rehab PT Goals Patient Stated Goal: return home without having to use RW PT Goal Formulation: With patient Time For Goal Achievement: 02/19/20 Potential to Achieve Goals: Good    Frequency Min 5X/week    AM-PAC PT "6 Clicks" Mobility  Outcome Measure Help needed turning from your back to your side while in a flat bed without using bedrails?: A Little Help  needed moving from lying on your back to sitting on the side of a flat bed without using bedrails?: A Little Help needed moving to and from a bed to a chair (including a wheelchair)?: A Little Help needed standing up from a chair using your arms (e.g., wheelchair or bedside chair)?: A Little Help needed to walk in hospital room?: A Little Help needed climbing 3-5 steps with a railing? : A Little 6 Click Score: 18    End of Session Equipment Utilized During Treatment: Gait belt;Back brace Activity Tolerance: Patient tolerated treatment well Patient left: in bed;with call bell/phone within reach;with SCD's reapplied Nurse Communication: Mobility status PT Visit Diagnosis: Other abnormalities of gait and mobility (R26.89);Difficulty in walking, not elsewhere classified (R26.2);Muscle weakness (generalized) (M62.81);Pain Pain - part of body:  (back)    Time: 7858-8502 PT Time Calculation (min) (ACUTE ONLY): 30 min   Charges:   PT Evaluation $PT Eval Moderate Complexity: 1 Mod PT Treatments $Gait Training: 8-22 mins        Karma Ganja, PT, DPT   Acute Rehabilitation Department Pager #: [phone]  Otho Bellows 02/05/2020, 5:51 PM

## 2020-02-05 NOTE — Progress Notes (Signed)
Orthopedic Tech Progress Note Patient Details:  Hannah Huerta 1960-07-29 [id number] RN said patient has brace Patient ID: Hannah Huerta, female   DOB: 08-20-1960, 59 y.o.   MRN: [id number]   Chip Boer 02/05/2020, 4:38 PM

## 2020-02-05 NOTE — Anesthesia Postprocedure Evaluation (Signed)
Anesthesia Post Note  Patient: DAVE MANNES  Procedure(s) Performed: POSTERIOR LUMBAR INTERBODY FUSION, INTERBODY PROSTHESIS, POSTERIOR INSTRUMENTATION LUMBAR TWO- LUMBAR THREE, LUMBAR THREE- LUMBAR FOUR; EXPLORE FUSION (N/A Spine Lumbar)     Patient location during evaluation: PACU Anesthesia Type: General Level of consciousness: awake and alert Pain management: pain level controlled Vital Signs Assessment: post-procedure vital signs reviewed and stable Respiratory status: spontaneous breathing, nonlabored ventilation and respiratory function stable Cardiovascular status: blood pressure returned to baseline and stable Postop Assessment: no apparent nausea or vomiting Anesthetic complications: no   No complications documented.  Last Vitals:  Vitals:   02/05/20 1350 02/05/20 1414  BP:  118/76  Pulse:  78  Resp:  16  Temp: 36.4 C 36.6 C  SpO2:  97%    Last Pain:  Vitals:   02/05/20 1414  TempSrc: Oral  PainSc:                  Audry Pili

## 2020-02-05 NOTE — H&P (Signed)
Subjective: The patient is a 59 year old black female on whom I performed an L4-5 decompression instrumentation and fusion years ago.  She initially did well but has developed recurrent back and leg pain consistent with neurogenic claudication.  She has failed medical management and was worked up with lumbar x-rays and lumbar MRI which demonstrated severe stenosis at L2-3 and L3-4.  I discussed the various treatment options with her.  She has decided to proceed with surgery.  Past Medical History:  Diagnosis Date  . Abnormal Pap smear of cervix    pt couldnt state what type of abnormality  . Anemia   . Arthritis   . CAD in native artery 06/05/2015  . Carpal tunnel syndrome, bilateral   . Chest pain    a. 02/2012 abnl myoview - inferoapical reverisbility concerning for ischemia, EF 59%;   02/2012 nonobstructive cath  . CHF (congestive heart failure) ([place])   . Chronic migraine   . Diabetes mellitus without complication ([place])   . GERD (gastroesophageal reflux disease)   . HLD (hyperlipidemia)   . Hypertension   . Hypokalemia 06/05/2015  . Metabolic syndrome 71/24/5809  . Morbid obesity with BMI of 50.0-59.9, adult ([place])   . Prediabetes   . S/P cardiac catheterization, non obstructive disease 06/04/15 06/05/2015  . Sickle cell trait ([place])   . Spondylolisthesis of lumbar region   . Syncope   . Wears glasses     Past Surgical History:  Procedure Laterality Date  . BACK SURGERY     x1 lumbar fusion  . CARDIAC CATHETERIZATION N/A 06/04/2015   Procedure: Left Heart Cath and Coronary Angiography;  Surgeon: [name], MD;  Location: [facility];  Service: Cardiovascular;  Laterality: N/A;  . CHOLECYSTECTOMY     laparoscopic  . COLONOSCOPY WITH PROPOFOL N/A 09/04/2014   Procedure: COLONOSCOPY WITH PROPOFOL;  Surgeon: [name], MD;  Location: [facility];  Service: Endoscopy;  Laterality: N/A;  . LEFT HEART CATHETERIZATION WITH CORONARY ANGIOGRAM N/A 03/08/2012   Procedure:  LEFT HEART CATHETERIZATION WITH CORONARY ANGIOGRAM;  Surgeon: Hillary Bow, MD;  Location: [facility] CATH [facility];  Service: Cardiovascular;  Laterality: N/A;  . SPINE SURGERY     fusion  . TUBAL LIGATION      No Known Allergies  Social History   Tobacco Use  . Smoking status: Never Smoker  . Smokeless tobacco: Never Used  Substance Use Topics  . Alcohol use: No    Family History  Problem Relation Age of Onset  . Coronary artery disease Father 82  . Heart disease Father   . Other Father        died in a MVA  . Hypertension Father   . Sudden death Mother 75       Questionable MI  . Diabetes Mother   . Hypertension Mother   . Hypertension Sister   . Pancreatic cancer Maternal Aunt   . Breast cancer Maternal Aunt        after age 85  . Stroke Paternal Grandmother   . Colon polyps Sister   . Colon cancer Neg Hx   . Gallbladder disease Neg Hx   . Esophageal cancer Neg Hx    Prior to Admission medications   Medication Sig Start Date End Date Taking? Authorizing Provider  amLODipine (NORVASC) 5 MG tablet Take 1 tablet (5 mg total) by mouth daily. 12/10/19  Yes Vevelyn Francois, NP  aspirin EC 81 MG tablet Take 1 tablet (81 mg total) by  mouth daily. 03/07/17  Yes Dorena Dew, FNP  atorvastatin (LIPITOR) 40 MG tablet Take 1 tablet (40 mg total) by mouth daily at 6 PM. 10/09/19  Yes Vevelyn Francois, NP  carvedilol (COREG) 6.25 MG tablet TAKE 1 TABLET BY MOUTH 2 TIMES DAILY WITH A MEAL. Patient taking differently: Take 6.25 mg by mouth 2 (two) times daily with a meal. TAKE 1 TABLET BY MOUTH 2 TIMES DAILY WITH A MEAL. 10/09/19  Yes Vevelyn Francois, NP  metFORMIN (GLUCOPHAGE) 500 MG tablet Take 1 tablet (500 mg total) by mouth 2 (two) times daily with a meal. 12/10/19 06/07/20 Yes Vevelyn Francois, NP  spironolactone (ALDACTONE) 25 MG tablet Take 1 tablet (25 mg total) by mouth daily. 10/09/19 01/22/20 Yes King, Diona Foley, NP  cyclobenzaprine (FLEXERIL) 10 MG tablet Take 1 tablet (10 mg total) by  mouth at bedtime. Patient not taking: Reported on 01/22/2020 10/23/19   Dalia Heading, PA-C  diazepam (VALIUM) 5 MG tablet Take 1 tablet (5 mg total) by mouth as needed for up to 2 doses for anxiety (Take one tab 45 minutes before MRI. Take second tablet just prior to MRI scan). Do not take medication within 4 hours of taking opioid pain medications. Must have a driver. Do not drive or operate machinery x 24 hours after taking this medication. Patient not taking: Reported on 01/22/2020 12/08/19   Vevelyn Francois, NP  hydrocortisone (ANUSOL-HC) 25 MG suppository Place 1 suppository (25 mg total) rectally 2 (two) times daily as needed for hemorrhoids or anal itching. Patient not taking: Reported on 09/08/2019 02/05/19   Petrucelli, Aldona Bar R, PA-C  polyethylene glycol (MIRALAX) 17 g packet Take 17 g by mouth daily as needed for moderate constipation. Patient not taking: Reported on 09/08/2019 02/05/19   Petrucelli, Glynda Jaeger, PA-C  predniSONE (DELTASONE) 50 MG tablet Take 1 tablet (50 mg total) by mouth daily with breakfast. Patient not taking: Reported on 01/22/2020 10/23/19   Dalia Heading, PA-C  traMADol (ULTRAM) 50 MG tablet Take 1 tablet (50 mg total) by mouth every 6 (six) hours as needed for severe pain. Patient not taking: Reported on 01/22/2020 10/23/19   Dalia Heading, PA-C     Review of Systems  Positive ROS: As above  All other systems have been reviewed and were otherwise negative with the exception of those mentioned in the HPI and as above.  Objective: Vital signs in last 24 hours: Temp:  [97.6 F (36.4 C)] 97.6 F (36.4 C) (07/22 0555) Pulse Rate:  [75] 75 (07/22 0555) Resp:  [19] 19 (07/22 0555) BP: (131)/(81) 131/81 (07/22 0555) SpO2:  [97 %] 97 % (07/22 0555) Weight:  [127.5 kg] 127.5 kg (07/22 0555) Estimated body mass index is 53.09 kg/m as calculated from the following:   Height as of this encounter: 5\' 1"  (1.549 m).   Weight as of this encounter: 127.5  kg.   General Appearance: Alert, obese Head: Normocephalic, without obvious abnormality, atraumatic Eyes: PERRL, conjunctiva/corneas clear, EOM's intact,    Ears: Normal  Throat: Normal  Neck: Supple, Back: Her lumbar incision is well-healed Lungs: Clear to auscultation bilaterally, respirations unlabored Heart: Regular rate and rhythm, no murmur, rub or gallop Abdomen: Soft, non-tender, obese Extremities: Extremities normal, atraumatic, no cyanosis or edema Skin: unremarkable  NEUROLOGIC:   Mental status: alert and oriented,Motor Exam - grossly normal Sensory Exam - grossly normal Reflexes:  Coordination - grossly normal Gait - grossly normal Balance - grossly normal Cranial Nerves: I: smell Not  tested  II: visual acuity  OS: Normal  OD: Normal   II: visual fields Full to confrontation  II: pupils Equal, round, reactive to light  III,VII: ptosis None  III,IV,VI: extraocular muscles  Full ROM  V: mastication Normal  V: facial light touch sensation  Normal  V,VII: corneal reflex  Present  VII: facial muscle function - upper  Normal  VII: facial muscle function - lower Normal  VIII: hearing Not tested  IX: soft palate elevation  Normal  IX,X: gag reflex Present  XI: trapezius strength  5/5  XI: sternocleidomastoid strength 5/5  XI: neck flexion strength  5/5  XII: tongue strength  Normal    Data Review Lab Results  Component Value Date   WBC 4.3 01/27/2020   HGB 11.6 (L) 01/27/2020   HCT 36.9 01/27/2020   MCV 79.5 (L) 01/27/2020   PLT 305 01/27/2020   Lab Results  Component Value Date   NA 139 01/27/2020   K 3.7 01/27/2020   CL 104 01/27/2020   CO2 27 01/27/2020   BUN 10 01/27/2020   CREATININE 0.88 01/27/2020   GLUCOSE 116 (H) 01/27/2020   Lab Results  Component Value Date   INR 1.07 03/08/2012    Assessment/Plan: L2-3 and L3-4 spinal stenosis, lumbago, lumbar radiculopathy, neurogenic claudication: I have discussed the situation with the patient.   I have reviewed her imaging studies with her and pointed out the abnormalities.  We have discussed the various treatment options including surgery.  I have described the surgical treatment option of an exploration of her lumbar fusion with an L2-3 and L3-4 decompression, instrumentation and fusion.  I have shown her surgical models.  I have given her a surgical pamphlet.  We have discussed the risks, benefits, alternatives, expected postoperative course, and likelihood of achieving our goals with surgery.  I have answered all questions.  She has decided to proceed with surgery.   Hannah Huerta 02/05/2020 7:20 AM

## 2020-02-05 NOTE — Anesthesia Procedure Notes (Signed)
Procedure Name: Intubation Date/Time: 02/05/2020 7:37 AM Performed by: Genelle Bal, CRNA Pre-anesthesia Checklist: Patient identified, Emergency Drugs available, Suction available and Patient being monitored Patient Re-evaluated:Patient Re-evaluated prior to induction Oxygen Delivery Method: Circle system utilized Preoxygenation: Pre-oxygenation with 100% oxygen Induction Type: IV induction Ventilation: Mask ventilation without difficulty Laryngoscope Size: Miller and 2 Grade View: Grade I Tube type: Oral Tube size: 7.0 mm Number of attempts: 1 Airway Equipment and Method: Stylet and Oral airway Placement Confirmation: ETT inserted through vocal cords under direct vision,  positive ETCO2 and breath sounds checked- equal and bilateral Secured at: 20 cm Tube secured with: Tape Dental Injury: Teeth and Oropharynx as per pre-operative assessment

## 2020-02-05 NOTE — Transfer of Care (Signed)
Immediate Anesthesia Transfer of Care Note  Patient: PORCHEA CHARRIER  Procedure(s) Performed: POSTERIOR LUMBAR INTERBODY FUSION, INTERBODY PROSTHESIS, POSTERIOR INSTRUMENTATION LUMBAR TWO- LUMBAR THREE, LUMBAR THREE- LUMBAR FOUR; EXPLORE FUSION (N/A Spine Lumbar)  Patient Location: PACU  Anesthesia Type:General  Level of Consciousness: awake, alert  and oriented  Airway & Oxygen Therapy: Patient Spontanous Breathing and Patient connected to face mask oxygen  Post-op Assessment: Report given to RN and Post -op Vital signs reviewed and stable  Post vital signs: Reviewed and stable  Last Vitals:  Vitals Value Taken Time  BP 128/86   Temp    Pulse 79   Resp 16   SpO2 100%     Last Pain:  Vitals:   02/05/20 0557  TempSrc:   PainSc: 6       Patients Stated Pain Goal: 0 (75/44/92 0100)  Complications: No complications documented.

## 2020-02-05 NOTE — Op Note (Signed)
Brief history: The patient is a 59 year old black female on whom I previously performed an L4-5 fusion.  She has done well for years but has developed recurrent back and leg pain consistent with neurogenic claudication.  She has failed medical management and was worked up with a lumbar MRI.  This demonstrated the patient had a spondylolisthesis and spinal stenosis at L2-3 and L3-4.  I discussed the various treatment options with her.  She has decided proceed with surgery.  Preoperative diagnosis: L2-3 spondylolisthesis, L2-3 and L3-4 degenerative disc disease, spinal stenosis compressing L3 and L4 nerve roots; lumbago; lumbar radiculopathy; neurogenic claudication  Postoperative diagnosis: The same  Procedure: Bilateral L2-3 and L3-4 laminotomy/foraminotomies/medial facetectomy to decompress the bilateral L2, L3 and L4 nerve roots(the work required to do this was in addition to the work required to do the posterior lumbar interbody fusion because of the patient's spinal stenosis, facet arthropathy. Etc. requiring a wide decompression of the nerve roots.);  L2-3 and L3-4 transforaminal lumbar interbody fusion with local morselized autograft bone and Zimmer DBM; insertion of interbody prosthesis at L2-3 and L3-4 (globus peek expandable interbody prosthesis); posterior segmental instrumentation from L2 to L5 with Medtronic titanium pedicle screws and rods; posterior lateral arthrodesis at L2-3 and L3-4 with local morselized autograft bone and Zimmer DBM.  Surgeon: Dr. Earle Gell  Asst.: Dr. Pieter Partridge Dawley and Arnetha Massy, NP  Anesthesia: Gen. endotracheal  Estimated blood loss: 250 cc  Drains: None  Complications: None  Description of procedure: The patient was brought to the operating room by the anesthesia team. General endotracheal anesthesia was induced. The patient was turned to the prone position on the Wilson frame. The patient's lumbosacral region was then prepared with Betadine scrub and  Betadine solution. Sterile drapes were applied.  I then injected the area to be incised with Marcaine with epinephrine solution. I then used the scalpel to make a linear midline incision over the L2-3, L3-4 and L4-5 interspace. I then used electrocautery to perform a bilateral subperiosteal dissection exposing the spinous process and lamina of L2, L3, L4 and L5 and to expose the old hardware bilaterally at L4-5. We then inserted the Verstrac retractor to provide exposure.  We explored the fusion by removing the caps from the old screws at L4-5 then removing the rods.  We inspected the arthrodesis at L4-5.  It appeared solid.  I began the decompression by using the high speed drill to perform laminotomies at L2-3 and L3-4 bilaterally. We then used the Kerrison punches to widen the laminotomy and removed the ligamentum flavum at L2-3 and L3-4 bilaterally. We used the Kerrison punches to remove the medial facets at L2-3 and L3-4 bilaterally, we removed the right facet at L2-3 and L3-4. We performed wide foraminotomies about the bilateral L2, L3 and L4 nerve roots completing the decompression.  We now turned our attention to the posterior lumbar interbody fusion. I used a scalpel to incise the intervertebral disc at L2-3 and L3-4 bilaterally. I then performed a partial intervertebral discectomy at L2-3 and L3-4 bilaterally using the pituitary forceps. We prepared the vertebral endplates at E5-2 and D7-8 bilaterally for the fusion by removing the soft tissues with the curettes. We then used the trial spacers to pick the appropriate sized interbody prosthesis. We prefilled his prosthesis with a combination of local morselized autograft bone that we obtained during the decompression as well as Zimmer DBM. We inserted the prefilled prosthesis into the interspace at L2-3 and L3-4 from the right, we then  turned and expanded the prosthesis. There was a good snug fit of the prosthesis in the interspace. We then filled  and the remainder of the intervertebral disc space with local morselized autograft bone and Zimmer DBM. This completed the posterior lumbar interbody arthrodesis.  During the decompression and insertion of the prosthesis the assistant protected the thecal sac and nerve roots with the D'Errico retractor.  We now turned attention to the instrumentation. Under fluoroscopic guidance we cannulated the bilateral L2 and L3 pedicles with the bone probe. We then removed the bone probe. We then tapped the pedicle with a 6.5 millimeter tap. We then removed the tap. We probed inside the tapped pedicle with a ball probe to rule out cortical breaches. We then inserted a 7.5 x 50 millimeter pedicle screw into the L2 and L3 pedicles bilaterally under fluoroscopic guidance. We then palpated along the medial aspect of the pedicles to rule out cortical breaches. There were none. The nerve roots were not injured. We then connected the unilateral pedicle screws from L2-L5 with a lordotic rod. We compressed the construct and secured the rod in place with the caps. We then tightened the caps appropriately. This completed the instrumentation from L2-L5 bilaterally.  We now turned our attention to the posterior lateral arthrodesis at L2-3 and L3-4 bilaterally. We used the high-speed drill to decorticate the remainder of the facets, pars, transverse process at L2-3 and L3-4 bilaterally. We then applied a combination of local morselized autograft bone and Zimmer DBM over these decorticated posterior lateral structures. This completed the posterior lateral arthrodesis.  We then obtained hemostasis using bipolar electrocautery. We irrigated the wound out with bacitracin solution. We inspected the thecal sac and nerve roots and noted they were well decompressed. We then removed the retractor.  We injected Exparel . We reapproximated patient's thoracolumbar fascia with interrupted #1 Vicryl suture. We reapproximated patient's subcutaneous  tissue with interrupted 2-0 Vicryl suture. The reapproximated patient's skin with Steri-Strips and benzoin. The wound was then coated with bacitracin ointment. A sterile dressing was applied. The drapes were removed. The patient was subsequently returned to the supine position where they were extubated by the anesthesia team. He was then transported to the post anesthesia care unit in stable condition. All sponge instrument and needle counts were reportedly correct at the end of this case.

## 2020-02-06 LAB — GLUCOSE, CAPILLARY
Glucose-Capillary: 119 mg/dL — ABNORMAL HIGH (ref 70–99)
Glucose-Capillary: 104 mg/dL — ABNORMAL HIGH (ref 70–99)
Glucose-Capillary: 112 mg/dL — ABNORMAL HIGH (ref 70–99)
Glucose-Capillary: 132 mg/dL — ABNORMAL HIGH (ref 70–99)

## 2020-02-06 LAB — BASIC METABOLIC PANEL
Anion gap: 10 (ref 5–15)
BUN: 16 mg/dL (ref 6–20)
CO2: 28 mmol/L (ref 22–32)
Calcium: 8.9 mg/dL (ref 8.9–10.3)
Chloride: 99 mmol/L (ref 98–111)
Creatinine, Ser: 1.3 mg/dL — ABNORMAL HIGH (ref 0.44–1.00)
GFR calc Af Amer: 52 mL/min — ABNORMAL LOW (ref >60)
GFR calc non Af Amer: 45 mL/min — ABNORMAL LOW (ref >60)
Glucose, Bld: 147 mg/dL — ABNORMAL HIGH (ref 70–99)
Potassium: 3.4 mmol/L — ABNORMAL LOW (ref 3.5–5.1)
Sodium: 137 mmol/L (ref 135–145)

## 2020-02-06 LAB — CBC
HCT: 29.3 % — ABNORMAL LOW (ref 36.0–46.0)
Hemoglobin: 9.1 g/dL — ABNORMAL LOW (ref 12.0–15.0)
MCH: 24.8 pg — ABNORMAL LOW (ref 26.0–34.0)
MCHC: 31.1 g/dL (ref 30.0–36.0)
MCV: 79.8 fL — ABNORMAL LOW (ref 80.0–100.0)
Platelets: 231 10*3/uL (ref 150–400)
RBC: 3.67 MIL/uL — ABNORMAL LOW (ref 3.87–5.11)
RDW: 14.1 % (ref 11.5–15.5)
WBC: 9.5 10*3/uL (ref 4.0–10.5)
nRBC: 0 % (ref 0.0–0.2)

## 2020-02-06 NOTE — Progress Notes (Signed)
Physical Therapy Treatment Patient Details Name: Hannah Huerta MRN: [id number] DOB: 30-Apr-1961 Today's Date: 02/06/2020    History of Present Illness Hannah Huerta is a 59 yo female presenting s/p PLIF L2-3 and L3-4 7/22 due to chronic spinal stenosis with radiculopathy and neurogenic claudication. PMH includes L4-5 decompression and fusion, CAD, CHF, DM II, HLD, HTN, morbid obesity.    PT Comments    Pt progressing towards physical therapy goals. Reports feeling disappointed in the amount of pain she was in at the time of PT session, as she reports feeling "great" last night. She required the nurse to urgently bring a transfer chair for her while ambulating in the hallway, as she needed a seated rest break due to pain. Will continue to follow and progress as able per POC.     Follow Up Recommendations  Home health PT;Supervision for mobility/OOB     Equipment Recommendations  Rolling walker with 5" wheels (youth size)    Recommendations for Other Services       Precautions / Restrictions Precautions Precautions: Back Precaution Booklet Issued: Yes (comment) Precaution Comments: Verbally reviewed throughout functional mobility Required Braces or Orthoses: Spinal Brace Spinal Brace: Lumbar corset;Applied in sitting position Restrictions Weight Bearing Restrictions: No    Mobility  Bed Mobility Overal bed mobility: Modified Independent Bed Mobility: Rolling;Sit to Sidelying Rolling: Supervision       Sit to sidelying: HOB elevated;Min assist General bed mobility comments: Assist to elevate LE's up onto bed at end of session  Transfers Overall transfer level: Needs assistance Equipment used: Rolling walker (2 wheeled) Transfers: Sit to/from Stand Sit to Stand: Min guard Stand pivot transfers: Min guard       General transfer comment: VC's for hand placement on seated surface for safety. No assist required however increased time taken due to  pain  Ambulation/Gait Ambulation/Gait assistance: Min guard Gait Distance (Feet): 150 Feet Assistive device: Rolling walker (2 wheeled) Gait Pattern/deviations: Step-through pattern;Decreased dorsiflexion - right;Decreased stride length;Trunk flexed;Wide base of support Gait velocity: Decreased Gait velocity interpretation: <1.31 ft/sec, indicative of household ambulator General Gait Details: Distance limited by pain and required chair to be pulled up behind her for seated rest in the hall.    Stairs             Wheelchair Mobility    Modified Rankin (Stroke Patients Only)       Balance Overall balance assessment: No apparent balance deficits (not formally assessed)                                          Cognition Arousal/Alertness: Awake/alert Behavior During Therapy: WFL for tasks assessed/performed Overall Cognitive Status: Within Functional Limits for tasks assessed                                        Exercises      General Comments        Pertinent Vitals/Pain Pain Assessment: Faces Pain Score: 4  Faces Pain Scale: Hurts whole lot Pain Location: low back, surgical site Pain Descriptors / Indicators: Grimacing;Sore Pain Intervention(s): Limited activity within patient's tolerance;Monitored during session;Repositioned;Ice applied    Home Living Family/patient expects to be discharged to:: Private residence Living Arrangements: Alone Available Help at Discharge: Family Type of Home: Apartment Home Access: Stairs to  enter Entrance Stairs-Rails: None Home Layout: One level Home Equipment: Cane - single point;Cane - quad;Grab bars - tub/shower      Prior Function Level of Independence: Independent          PT Goals (current goals can now be found in the care plan section) Acute Rehab PT Goals Patient Stated Goal: to take care of herself PT Goal Formulation: With patient Time For Goal Achievement:  02/19/20 Potential to Achieve Goals: Good Progress towards PT goals: Progressing toward goals    Frequency    Min 5X/week      PT Plan Current plan remains appropriate    Co-evaluation              AM-PAC PT "6 Clicks" Mobility   Outcome Measure  Help needed turning from your back to your side while in a flat bed without using bedrails?: A Little Help needed moving from lying on your back to sitting on the side of a flat bed without using bedrails?: A Little Help needed moving to and from a bed to a chair (including a wheelchair)?: A Little Help needed standing up from a chair using your arms (e.g., wheelchair or bedside chair)?: A Little Help needed to walk in hospital room?: A Little Help needed climbing 3-5 steps with a railing? : A Little 6 Click Score: 18    End of Session Equipment Utilized During Treatment: Gait belt;Back brace Activity Tolerance: Patient limited by pain Patient left: in bed;with call bell/phone within reach Nurse Communication: Mobility status PT Visit Diagnosis: Other abnormalities of gait and mobility (R26.89);Difficulty in walking, not elsewhere classified (R26.2);Muscle weakness (generalized) (M62.81);Pain Pain - part of body:  (back)     Time: 2025-4270 PT Time Calculation (min) (ACUTE ONLY): 26 min  Charges:  $Gait Training: 23-37 mins                     Rolinda Roan, PT, DPT Acute Rehabilitation Services Pager: [phone] Office: [phone]    Thelma Comp 02/06/2020, 12:29 PM

## 2020-02-06 NOTE — Progress Notes (Signed)
Occupational Therapy Treatment Patient Details Name: Hannah Huerta MRN: [id number] DOB: 1960/08/28 Today's Date: 02/06/2020    History of present illness Bulah Lurie is a 59 yo female presenting s/p PLIF L2-3 and L3-4 7/22 due to chronic spinal stenosis with radiculopathy and neurogenic claudication. PMH includes L4-5 decompression and fusion, CAD, CHF, DM II, HLD, HTN, morbid obesity.   OT comments  PTA, pt independent with ADL and mobility. Pt has hx of R footdrop for > 1 yr. Began education regarding compensatory strategies for ADL and functional mobility for ADL while following back precautions. Pt will benefit from use of AE to help with LB ADL. Recommend tub bench to increase safety with tub tranfers. Will follow acutely but do not anticipate need for follow up OT after DC. Sons will be able to assist as needed.   Follow Up Recommendations  No OT follow up;Supervision - Intermittent    Equipment Recommendations  Tub/shower bench    Recommendations for Other Services      Precautions / Restrictions Precautions Precautions: Back Precaution Booklet Issued: Yes (comment) Required Braces or Orthoses: Spinal Brace Spinal Brace: Lumbar corset;Applied in sitting position       Mobility Bed Mobility Overal bed mobility: Modified Independent                Transfers Overall transfer level: Needs assistance   Transfers: Sit to/from Stand;Stand Pivot Transfers Sit to Stand: Supervision Stand pivot transfers: Min guard            Balance Overall balance assessment: No apparent balance deficits (not formally assessed)                                         ADL either performed or assessed with clinical judgement   ADL Overall ADL's : Needs assistance/impaired     Grooming: Set up   Upper Body Bathing: Set up;Supervision/ safety;Sitting   Lower Body Bathing: Moderate assistance;Cueing for back precautions;Sit to/from stand   Upper Body  Dressing : Supervision/safety;Set up;Sitting   Lower Body Dressing: Moderate assistance;Sit to/from stand   Toilet Transfer: Designer, fashion/clothing and Hygiene: Moderate assistance Toileting - Clothing Manipulation Details (indicate cue type and reason): Educated on use of toilet tongs   Tub/Shower Transfer Details (indicate cue type and reason): Educated on use of tub bench; pt shown picture adn states it would help her tremendously.  Functional mobility during ADLs: Min guard General ADL Comments: Educated on use of toilet tong; long handled sponge to assist with ADL and adhere to back precautions. PT verbalized understanding.  Issued toilet tongs - Pt has Medicaid.      Vision       Perception     Praxis      Cognition Arousal/Alertness: Awake/alert Behavior During Therapy: WFL for tasks assessed/performed Overall Cognitive Status: Within Functional Limits for tasks assessed                                          Exercises     Shoulder Instructions       General Comments      Pertinent Vitals/ Pain       Pain Assessment: 0-10 Pain Score: 4  Pain Location: low back, surgical site Pain Descriptors / Indicators: Grimacing;Sore Pain  Intervention(s): Limited activity within patient's tolerance  Home Living Family/patient expects to be discharged to:: Private residence Living Arrangements: Alone Available Help at Discharge: Family Type of Home: Apartment Home Access: Stairs to enter Technical brewer of Steps: 1 Entrance Stairs-Rails: None Home Layout: One level     Bathroom Shower/Tub: Teacher, early years/pre: Standard Bathroom Accessibility: Yes How Accessible: Accessible via walker [place] - single point;Cane - quad;Grab bars - tub/shower          Prior Functioning/Environment Level of Independence: Independent            Frequency  Min 2X/week        Progress Toward  Goals  OT Goals(current goals can now be found in the care plan section)     Acute Rehab OT Goals Patient Stated Goal: to take care of herself OT Goal Formulation: With patient Time For Goal Achievement: 02/20/20 Potential to Achieve Goals: Good  Plan      Co-evaluation                 AM-PAC OT "6 Clicks" Daily Activity     Outcome Measure   Help from another person eating meals?: None Help from another person taking care of personal grooming?: A Little Help from another person toileting, which includes using toliet, bedpan, or urinal?: A Lot Help from another person bathing (including washing, rinsing, drying)?: A Lot Help from another person to put on and taking off regular upper body clothing?: A Little Help from another person to put on and taking off regular lower body clothing?: A Lot 6 Click Score: 16    End of Session    OT Visit Diagnosis: Muscle weakness (generalized) (M62.81);Pain;Other abnormalities of gait and mobility (R26.89) Pain - part of body:  (back)   Activity Tolerance Patient tolerated treatment well   Patient Left in bed;Other (comment);with nursing/sitter in room (sitting EOB)   Nurse Communication Other (comment) (dressing saurated; draining)        Time: 3662-9476 OT Time Calculation (min): 37 min  Charges: OT General Charges $OT Visit: 1 Visit OT Evaluation $OT Eval Low Complexity: 1 Low OT Treatments $Self Care/Home Management : 8-22 mins  Maurie Boettcher, OT/L   Acute OT Clinical Specialist [place] Pager [phone] Office [phone]    [facility] 02/06/2020, 10:17 AM

## 2020-02-06 NOTE — Progress Notes (Signed)
   Providing Compassionate, Quality Care - Together   Subjective: Patient reports pain and muscle spasms following therapies this morning. Would like to stay one more night. Nursing staff report moderate sanguinous drainage from surgical wound.  Objective: Vital signs in last 24 hours: Temp:  [97.6 F (36.4 C)-98.6 F (37 C)] 98.5 F (36.9 C) (07/23 0740) Pulse Rate:  [56-78] 69 (07/23 0740) Resp:  [16-20] 18 (07/23 0740) BP: (93-135)/(46-88) 93/46 (07/23 0740) SpO2:  [94 %-97 %] 97 % (07/23 0740)  Intake/Output from previous day: 07/22 0701 - 07/23 0700 In: 1850 [I.V.:1800; IV Piggyback:50] Out: 605 [Urine:405; Blood:200] Intake/Output this shift: No intake/output data recorded.  Alert and oriented x 4 PERRLA CN II-XII grossly intact MAE, Strength and sensation intact Incision is covered gauze and Steri Strips; Dressing is clean, dry, and intact   Lab Results: Recent Labs    02/06/20 0809  WBC 9.5  HGB 9.1*  HCT 29.3*  PLT 231   BMET Recent Labs    02/06/20 0809  NA 137  K 3.4*  CL 99  CO2 28  GLUCOSE 147*  BUN 16  CREATININE 1.30*  CALCIUM 8.9    Studies/Results: DG Lumbar Spine 2-3 Views  Result Date: 02/05/2020 CLINICAL DATA:  Lumbar surgery. EXAM: LUMBAR SPINE - 2-3 VIEW; DG C-ARM 1-60 MIN COMPARISON:  MRI 12/09/2019. FINDINGS: Posterior i and nterbody lumbar spine fusion. Visualized hardware intact. Visualized bony structures are in alignment. 0 minutes 31 seconds fluoroscopy. Two images obtained. IMPRESSION: Postsurgical changes lumbar spine. Visualized hardware intact. Visualized bony structures are and alignment. Electronically Signed   By: Marcello Moores  Register   On: 02/05/2020 13:36   DG C-Arm 1-60 Min  Result Date: 02/05/2020 CLINICAL DATA:  Lumbar surgery. EXAM: LUMBAR SPINE - 2-3 VIEW; DG C-ARM 1-60 MIN COMPARISON:  MRI 12/09/2019. FINDINGS: Posterior i and nterbody lumbar spine fusion. Visualized hardware intact. Visualized bony structures are  in alignment. 0 minutes 31 seconds fluoroscopy. Two images obtained. IMPRESSION: Postsurgical changes lumbar spine. Visualized hardware intact. Visualized bony structures are and alignment. Electronically Signed   By: Marcello Moores  Register   On: 02/05/2020 13:36    Assessment/Plan: Patient is one day status post 2 level fusion. She is progressing reasonably well. Will keep one more night with plans to discharge home tomorrow.   LOS: 1 day    Viona Gilmore, DNP, AGNP-C Nurse Practitioner  [facility] [place] [address] [place], [place], [place] [address] P: [phone]    F: [phone]  02/06/2020, 10:39 AM

## 2020-02-07 LAB — GLUCOSE, CAPILLARY: Glucose-Capillary: 130 mg/dL — ABNORMAL HIGH (ref 70–99)

## 2020-02-07 MED ORDER — CYCLOBENZAPRINE HCL 10 MG PO TABS: 10.0000 mg | ORAL_TABLET | Freq: Three times a day (TID) | ORAL | 0 refills | Status: DC | PRN

## 2020-02-07 MED ORDER — OXYCODONE HCL 10 MG PO TABS: 10.0000 mg | ORAL_TABLET | Freq: Four times a day (QID) | ORAL | 0 refills | Status: AC | PRN

## 2020-02-07 NOTE — Discharge Instructions (Signed)
Wound Care Keep incision covered and dry for two days.  Do not put any creams, lotions, or ointments on incision. Leave steri-strips on back.  They will fall off by themselves. Change dressing as needed. Activity Walk each and every day, increasing distance each day. No lifting greater than 5 lbs.  Avoid excessive neck motion. No driving for 2 weeks; may ride as a passenger locally. If provided with back brace, wear when out of bed.  It is not necessary to wear brace in bed. Diet Resume your normal diet.   Call Your Doctor If Any of These Occur Redness, drainage, or swelling at the wound.  Temperature greater than 101 degrees. Severe pain not relieved by pain medication. Incision starts to come apart. Follow Up Appt Call today for appointment in 3 weeks [phone]) or for problems.  If you have any hardware placed in your spine, you will need an x-ray before your appointment.

## 2020-02-07 NOTE — Progress Notes (Signed)
Physical Therapy Treatment Patient Details Name: Hannah Huerta MRN: [id number] DOB: 12-Jan-1961 Today's Date: 02/07/2020    History of Present Illness Sabrina Keough is a 59 yo female presenting s/p PLIF L2-3 and L3-4 7/22 due to chronic spinal stenosis with radiculopathy and neurogenic claudication. PMH includes L4-5 decompression and fusion, CAD, CHF, DM II, HLD, HTN, morbid obesity.    PT Comments    Pt continuing to progress well with functional mobility. Pt with improved pain tolerance today and ready for d/c home with family support.    Follow Up Recommendations  Home health PT;Supervision for mobility/OOB     Equipment Recommendations  Rolling walker with 5" wheels;Other (comment) (youth sized)    Recommendations for Other Services       Precautions / Restrictions Precautions Precautions: Back Precaution Comments: Verbally reviewed throughout functional mobility Required Braces or Orthoses: Spinal Brace Spinal Brace: Lumbar corset;Applied in sitting position Restrictions Weight Bearing Restrictions: No    Mobility  Bed Mobility Overal bed mobility: Modified Independent                Transfers Overall transfer level: Needs assistance Equipment used: Rolling walker (2 wheeled) Transfers: Sit to/from Stand Sit to Stand: Min guard         General transfer comment: VC's for hand placement on seated surface for safety. No assist required however increased time taken due to pain  Ambulation/Gait Ambulation/Gait assistance: Min guard Gait Distance (Feet): 250 Feet Assistive device: Rolling walker (2 wheeled) Gait Pattern/deviations: Step-through pattern;Decreased dorsiflexion - right;Decreased stride length;Trunk flexed;Wide base of support Gait velocity: Decreased   General Gait Details: pt with slow, steady gait in hallway with RW; no LOB or need for physical assistance   Stairs             Wheelchair Mobility    Modified Rankin (Stroke Patients  Only)       Balance Overall balance assessment: No apparent balance deficits (not formally assessed)                                          Cognition Arousal/Alertness: Awake/alert Behavior During Therapy: WFL for tasks assessed/performed Overall Cognitive Status: Within Functional Limits for tasks assessed                                        Exercises      General Comments        Pertinent Vitals/Pain Pain Assessment: Faces Faces Pain Scale: Hurts little more Pain Location: low back, surgical site Pain Descriptors / Indicators: Sore Pain Intervention(s): Monitored during session;Repositioned    Home Living                      Prior Function            PT Goals (current goals can now be found in the care plan section) Acute Rehab PT Goals PT Goal Formulation: With patient Time For Goal Achievement: 02/19/20 Potential to Achieve Goals: Good Progress towards PT goals: Progressing toward goals    Frequency    Min 5X/week      PT Plan Current plan remains appropriate    Co-evaluation              AM-PAC PT "6 Clicks" Mobility  Outcome Measure  Help needed turning from your back to your side while in a flat bed without using bedrails?: None Help needed moving from lying on your back to sitting on the side of a flat bed without using bedrails?: None Help needed moving to and from a bed to a chair (including a wheelchair)?: None Help needed standing up from a chair using your arms (e.g., wheelchair or bedside chair)?: None Help needed to walk in hospital room?: A Little Help needed climbing 3-5 steps with a railing? : A Little 6 Click Score: 22    End of Session Equipment Utilized During Treatment: Back brace Activity Tolerance: Patient tolerated treatment well Patient left: in bed;with call bell/phone within reach;Other (comment) (seated EOB) Nurse Communication: Mobility status PT Visit Diagnosis:  Other abnormalities of gait and mobility (R26.89);Muscle weakness (generalized) (M62.81);Pain Pain - part of body:  (back)     Time: 4098-1191 PT Time Calculation (min) (ACUTE ONLY): 12 min  Charges:  $Gait Training: 8-22 mins                     Anastasio Champion, DPT  Acute Rehabilitation Services Pager [phone] Office [place] 02/07/2020, 8:54 AM

## 2020-02-07 NOTE — Discharge Summary (Signed)
Physician Discharge Summary  Patient ID: Hannah Huerta MRN: [id number] DOB/AGE: [birth year] 59 y.o.  Admit date: 02/05/2020 Discharge date: 02/07/2020  Admission Diagnoses:L2-3 spondylolisthesis, L2-3 and L3-4 degenerative disc disease, spinal stenosis compressing L3 and L4 nerve roots; lumbago; lumbar radiculopathy; neurogenic claudication    Discharge Diagnoses: L2-3 spondylolisthesis, L2-3 and L3-4 degenerative disc disease, spinal stenosis compressing L3 and L4 nerve roots; lumbago; lumbar radiculopathy; neurogenic claudication   Active Problems:   Spondylolisthesis of lumbar region   Discharged Condition: good  Hospital Course: Hannah Huerta is a 59 y.o. female Whom was admitted for lumbar stenosis at L2/3,3/4. She was taken to the operating room for an uncomplicated lumbar decompression and fusion. Post op she is ambulating, voiding, and tolerating a regular diet. Wound is cleAn,dry, and without signs of infection.   Treatments: surgery: Bilateral L2-3 and L3-4 laminotomy/foraminotomies/medial facetectomy to decompress the bilateral L2, L3 and L4 nerve roots(the work required to do this was in addition to the work required to do the posterior lumbar interbody fusion because of the patient's spinal stenosis, facet arthropathy. Etc. requiring a wide decompression of the nerve roots.);  L2-3 and L3-4 transforaminal lumbar interbody fusion with local morselized autograft bone and Zimmer DBM; insertion of interbody prosthesis at L2-3 and L3-4 (globus peek expandable interbody prosthesis); posterior segmental instrumentation from L2 to L5 with [employer]tr[employer] titanium pedicle screws and rods; posterior lateral arthrodesis at L2-3 and L3-4 with local morselized autograft bone and Zimmer DBM.    Discharge Exam: Blood pressure 113/74, pulse 75, temperature 98.4 F (36.9 C), temperature source Oral, resp. rate 19, height 5\' 1"  (1.549 m), weight 127.5 kg, last menstrual period 09/15/2013, SpO2 93  %. General appearance: alert, cooperative, appears stated age and mild distress  Disposition: Discharge disposition: 01-Home or Self Care      SPONDYLOLISTHESIS, LUMBAR REGION  Allergies as of 02/07/2020   No Known Allergies     Medication List    STOP taking these medications   diazepam 5 MG tablet Commonly known as: Valium   hydrocortisone 25 MG suppository Commonly known as: ANUSOL-HC   predniSONE 50 MG tablet Commonly known as: DELTASONE   traMADol 50 MG tablet Commonly known as: ULTRAM     TAKE these medications   amLODipine 5 MG tablet Commonly known as: NORVASC Take 1 tablet (5 mg total) by mouth daily.   aspirin EC 81 MG tablet Take 1 tablet (81 mg total) by mouth daily.   atorvastatin 40 MG tablet Commonly known as: LIPITOR Take 1 tablet (40 mg total) by mouth daily at 6 PM.   carvedilol 6.25 MG tablet Commonly known as: COREG TAKE 1 TABLET BY MOUTH 2 TIMES DAILY WITH A MEAL. What changed:   how much to take  how to take this  when to take this   cyclobenzaprine 10 MG tablet Commonly known as: FLEXERIL Take 1 tablet (10 mg total) by mouth 3 (three) times daily as needed for muscle spasms. What changed:   when to take this  reasons to take this   metFORMIN 500 MG tablet Commonly known as: GLUCOPHAGE Take 1 tablet (500 mg total) by mouth 2 (two) times daily with a meal.   Oxycodone HCl 10 MG Tabs Take 1 tablet (10 mg total) by mouth every 6 (six) hours as needed for up to 7 days for severe pain ((score 7 to 10)).   polyethylene glycol 17 g packet Commonly known as: MiraLax Take 17 g by mouth daily as needed for moderate  constipation.   spironolactone 25 MG tablet Commonly known as: ALDACTONE Take 1 tablet (25 mg total) by mouth daily.            Durable Medical Equipment  (From admission, onward)         Start     Ordered   02/06/20 1100  For home use only DME Walker youth  Once       Question:  Patient needs a walker to  treat with the following condition  Answer:  S/P lumbar spinal fusion   02/06/20 1100   02/06/20 1001  For home use only DME Tub bench  Once        02/06/20 1001          Follow-up Information    Newman Pies, MD Follow up in 3 week(s).   Specialty: Neurosurgery Why: please call to make an appointment Contact information: [address] [place] [address] [phone]               Signed: Ashok Pall 02/07/2020, 9:01 AM

## 2020-02-07 NOTE — Plan of Care (Signed)
Patient alert and oriented, mae's well, voiding adequate amount of urine, swallowing without difficulty, no c/o pain at time of discharge. Patient discharged home with family. Script and discharged instructions given to patient. Patient and family stated understanding of instructions given. Patient has an appointment with Dr. Jenkins   

## 2020-02-07 NOTE — Progress Notes (Signed)
Occupational Therapy Treatment Patient Details Name: Hannah Huerta MRN: [id number] DOB: 07-Jun-1961 Today's Date: 02/07/2020    History of present illness Hannah Huerta is a 59 yo female presenting s/p PLIF L2-3 and L3-4 7/22 due to chronic spinal stenosis with radiculopathy and neurogenic claudication. PMH includes L4-5 decompression and fusion, CAD, CHF, DM II, HLD, HTN, morbid obesity.   OT comments  Patient continues to make steady progress towards goals in skilled OT session. Patient's session encompassed education with regard to ADLs and long handled sponge provided with teach back strategies employed. Pt re-educated in use of toilet tongs when using restroom and was able to provide 3/3 back precautions with no cuing needed. Discharge remains appropriate at this time; will continue to follow acutely.    Follow Up Recommendations  No OT follow up;Supervision - Intermittent    Equipment Recommendations  Tub/shower bench    Recommendations for Other Services      Precautions / Restrictions Precautions Precautions: Back Precaution Booklet Issued: Yes (comment) Precaution Comments: Verbally reviewed throughout functional mobility Required Braces or Orthoses: Spinal Brace Spinal Brace: Lumbar corset;Applied in sitting position Restrictions Weight Bearing Restrictions: No       Mobility Bed Mobility Overal bed mobility: Modified Independent                Transfers Overall transfer level: Needs assistance Equipment used: Rolling walker (2 wheeled) Transfers: Sit to/from Stand Sit to Stand: Min guard         General transfer comment: VC's for hand placement on seated surface for safety. No assist required however increased time taken due to pain    Balance Overall balance assessment: No apparent balance deficits (not formally assessed)                                         ADL either performed or assessed with clinical judgement   ADL                                        Functional mobility during ADLs: Min guard General ADL Comments: Pt session focus on education with long handled sponge...patient has Medicaid, therefore issued long handled sponge at end of session, re-educated on toilet tongs     Vision       Perception     Praxis      Cognition Arousal/Alertness: Awake/alert Behavior During Therapy: WFL for tasks assessed/performed Overall Cognitive Status: Within Functional Limits for tasks assessed                                          Exercises     Shoulder Instructions       General Comments      Pertinent Vitals/ Pain       Pain Assessment: Faces Faces Pain Scale: Hurts little more Pain Location: low back, surgical site Pain Descriptors / Indicators: Sore Pain Intervention(s): Monitored during session  Home Living                                          Prior Functioning/Environment  Frequency  Min 2X/week        Progress Toward Goals  OT Goals(current goals can now be found in the care plan section)  Progress towards OT goals: Progressing toward goals  Acute Rehab OT Goals Patient Stated Goal: to take care of herself OT Goal Formulation: With patient Time For Goal Achievement: 02/20/20 Potential to Achieve Goals: Good  Plan Discharge plan remains appropriate    Co-evaluation                 AM-PAC OT "6 Clicks" Daily Activity     Outcome Measure   Help from another person eating meals?: None Help from another person taking care of personal grooming?: A Little Help from another person toileting, which includes using toliet, bedpan, or urinal?: A Lot Help from another person bathing (including washing, rinsing, drying)?: A Lot Help from another person to put on and taking off regular upper body clothing?: A Little Help from another person to put on and taking off regular lower body clothing?: A  Lot 6 Click Score: 16    End of Session    OT Visit Diagnosis: Muscle weakness (generalized) (M62.81);Pain;Other abnormalities of gait and mobility (R26.89) Pain - part of body:  (Back)   Activity Tolerance Patient tolerated treatment well   Patient Left in bed   Nurse Communication Mobility status        Time: 0601-5615 OT Time Calculation (min): 8 min  Charges: OT General Charges $OT Visit: 1 Visit OT Treatments $Self Care/Home Management : 8-22 mins  Corinne Ports E. Jayle Solarz, COTA/L Acute Rehabilitation Services [phone] [place] 02/07/2020, 9:12 AM

## 2020-02-07 NOTE — TOC Transition Note (Signed)
Transition of Care [facility]) - CM/SW Discharge Note   Patient Details  Name: Hannah Huerta MRN: [id number] Date of Birth: 1961-05-12  [facility] CM/SW Contact:  Claudie Leach, RN 02/07/2020, 9:20 AM   Clinical Narrative:    Patient to d/c home today. Patient privately paying [employer] for tub bench.    Patient agreeable to [facility] PT/OT, HHA.  No preference of [place] agency.  Referral accepted by [facility] with Alvis Lemmings.  Final next level of care: [place] Barriers to Discharge: No Barriers Identified   Patient Goals and CMS Choice Patient states their goals for this hospitalization and ongoing recovery are:: to get well CMS Medicare.gov Compare Post Acute Care list provided to:: Patient Choice offered to / list presented to : Patient   Discharge Plan and Services                DME Arranged: 3-N-1, Tub bench DME Agency: [employer] Date DME Agency Contacted:  (7/23 by unit staff)     [place] Arranged: PT, OT, Nurse's Aide [place] Agency: [place] Date [facility] Agency Contacted: 02/07/20 Time [place]: [phone] Representative spoke with at [place]: Tommi Rumps

## 2020-02-10 MED FILL — Heparin Sodium (Porcine) Inj 1000 Unit/ML: INTRAMUSCULAR | Qty: 30 | Status: AC

## 2020-02-10 MED FILL — Sodium Chloride IV Soln 0.9%: INTRAVENOUS | Qty: 1000 | Status: AC

## 2020-02-11 DIAGNOSIS — M5116 Intervertebral disc disorders with radiculopathy, lumbar region: Secondary | ICD-10-CM | POA: Diagnosis not present

## 2020-02-11 DIAGNOSIS — M4316 Spondylolisthesis, lumbar region: Secondary | ICD-10-CM | POA: Diagnosis not present

## 2020-02-11 DIAGNOSIS — E785 Hyperlipidemia, unspecified: Secondary | ICD-10-CM | POA: Diagnosis not present

## 2020-02-11 DIAGNOSIS — Z4789 Encounter for other orthopedic aftercare: Secondary | ICD-10-CM | POA: Diagnosis not present

## 2020-02-11 DIAGNOSIS — M170 Bilateral primary osteoarthritis of knee: Secondary | ICD-10-CM | POA: Diagnosis not present

## 2020-02-11 DIAGNOSIS — E119 Type 2 diabetes mellitus without complications: Secondary | ICD-10-CM | POA: Diagnosis not present

## 2020-02-11 DIAGNOSIS — I119 Hypertensive heart disease without heart failure: Secondary | ICD-10-CM | POA: Diagnosis not present

## 2020-02-11 DIAGNOSIS — Z6841 Body Mass Index (BMI) 40.0 and over, adult: Secondary | ICD-10-CM | POA: Diagnosis not present

## 2020-02-11 DIAGNOSIS — E669 Obesity, unspecified: Secondary | ICD-10-CM | POA: Diagnosis not present

## 2020-02-12 DIAGNOSIS — Z981 Arthrodesis status: Secondary | ICD-10-CM | POA: Insufficient documentation

## 2020-02-13 DIAGNOSIS — M5116 Intervertebral disc disorders with radiculopathy, lumbar region: Secondary | ICD-10-CM | POA: Diagnosis not present

## 2020-02-13 DIAGNOSIS — M170 Bilateral primary osteoarthritis of knee: Secondary | ICD-10-CM | POA: Diagnosis not present

## 2020-02-13 DIAGNOSIS — M4316 Spondylolisthesis, lumbar region: Secondary | ICD-10-CM | POA: Diagnosis not present

## 2020-02-13 DIAGNOSIS — E785 Hyperlipidemia, unspecified: Secondary | ICD-10-CM | POA: Diagnosis not present

## 2020-02-13 DIAGNOSIS — I119 Hypertensive heart disease without heart failure: Secondary | ICD-10-CM | POA: Diagnosis not present

## 2020-02-13 DIAGNOSIS — E669 Obesity, unspecified: Secondary | ICD-10-CM | POA: Diagnosis not present

## 2020-02-13 DIAGNOSIS — Z4789 Encounter for other orthopedic aftercare: Secondary | ICD-10-CM | POA: Diagnosis not present

## 2020-02-13 DIAGNOSIS — Z6841 Body Mass Index (BMI) 40.0 and over, adult: Secondary | ICD-10-CM | POA: Diagnosis not present

## 2020-02-13 DIAGNOSIS — E119 Type 2 diabetes mellitus without complications: Secondary | ICD-10-CM | POA: Diagnosis not present

## 2020-02-17 DIAGNOSIS — M170 Bilateral primary osteoarthritis of knee: Secondary | ICD-10-CM | POA: Diagnosis not present

## 2020-02-17 DIAGNOSIS — E785 Hyperlipidemia, unspecified: Secondary | ICD-10-CM | POA: Diagnosis not present

## 2020-02-17 DIAGNOSIS — M5116 Intervertebral disc disorders with radiculopathy, lumbar region: Secondary | ICD-10-CM | POA: Diagnosis not present

## 2020-02-17 DIAGNOSIS — I119 Hypertensive heart disease without heart failure: Secondary | ICD-10-CM | POA: Diagnosis not present

## 2020-02-17 DIAGNOSIS — M4316 Spondylolisthesis, lumbar region: Secondary | ICD-10-CM | POA: Diagnosis not present

## 2020-02-17 DIAGNOSIS — Z6841 Body Mass Index (BMI) 40.0 and over, adult: Secondary | ICD-10-CM | POA: Diagnosis not present

## 2020-02-17 DIAGNOSIS — Z4789 Encounter for other orthopedic aftercare: Secondary | ICD-10-CM | POA: Diagnosis not present

## 2020-02-17 DIAGNOSIS — E119 Type 2 diabetes mellitus without complications: Secondary | ICD-10-CM | POA: Diagnosis not present

## 2020-02-17 DIAGNOSIS — E669 Obesity, unspecified: Secondary | ICD-10-CM | POA: Diagnosis not present

## 2020-02-18 DIAGNOSIS — I119 Hypertensive heart disease without heart failure: Secondary | ICD-10-CM | POA: Diagnosis not present

## 2020-02-18 DIAGNOSIS — Z4789 Encounter for other orthopedic aftercare: Secondary | ICD-10-CM | POA: Diagnosis not present

## 2020-02-18 DIAGNOSIS — E669 Obesity, unspecified: Secondary | ICD-10-CM | POA: Diagnosis not present

## 2020-02-18 DIAGNOSIS — M5116 Intervertebral disc disorders with radiculopathy, lumbar region: Secondary | ICD-10-CM | POA: Diagnosis not present

## 2020-02-18 DIAGNOSIS — E119 Type 2 diabetes mellitus without complications: Secondary | ICD-10-CM | POA: Diagnosis not present

## 2020-02-18 DIAGNOSIS — Z6841 Body Mass Index (BMI) 40.0 and over, adult: Secondary | ICD-10-CM | POA: Diagnosis not present

## 2020-02-18 DIAGNOSIS — E785 Hyperlipidemia, unspecified: Secondary | ICD-10-CM | POA: Diagnosis not present

## 2020-02-18 DIAGNOSIS — M4316 Spondylolisthesis, lumbar region: Secondary | ICD-10-CM | POA: Diagnosis not present

## 2020-02-18 DIAGNOSIS — M170 Bilateral primary osteoarthritis of knee: Secondary | ICD-10-CM | POA: Diagnosis not present

## 2020-02-20 DIAGNOSIS — M170 Bilateral primary osteoarthritis of knee: Secondary | ICD-10-CM | POA: Diagnosis not present

## 2020-02-20 DIAGNOSIS — I119 Hypertensive heart disease without heart failure: Secondary | ICD-10-CM | POA: Diagnosis not present

## 2020-02-20 DIAGNOSIS — Z4789 Encounter for other orthopedic aftercare: Secondary | ICD-10-CM | POA: Diagnosis not present

## 2020-02-20 DIAGNOSIS — E785 Hyperlipidemia, unspecified: Secondary | ICD-10-CM | POA: Diagnosis not present

## 2020-02-20 DIAGNOSIS — Z6841 Body Mass Index (BMI) 40.0 and over, adult: Secondary | ICD-10-CM | POA: Diagnosis not present

## 2020-02-20 DIAGNOSIS — M4316 Spondylolisthesis, lumbar region: Secondary | ICD-10-CM | POA: Diagnosis not present

## 2020-02-20 DIAGNOSIS — E669 Obesity, unspecified: Secondary | ICD-10-CM | POA: Diagnosis not present

## 2020-02-20 DIAGNOSIS — M5116 Intervertebral disc disorders with radiculopathy, lumbar region: Secondary | ICD-10-CM | POA: Diagnosis not present

## 2020-02-20 DIAGNOSIS — E119 Type 2 diabetes mellitus without complications: Secondary | ICD-10-CM | POA: Diagnosis not present

## 2020-02-23 DIAGNOSIS — Z4789 Encounter for other orthopedic aftercare: Secondary | ICD-10-CM | POA: Diagnosis not present

## 2020-02-23 DIAGNOSIS — M5116 Intervertebral disc disorders with radiculopathy, lumbar region: Secondary | ICD-10-CM | POA: Diagnosis not present

## 2020-02-23 DIAGNOSIS — E669 Obesity, unspecified: Secondary | ICD-10-CM | POA: Diagnosis not present

## 2020-02-23 DIAGNOSIS — E785 Hyperlipidemia, unspecified: Secondary | ICD-10-CM | POA: Diagnosis not present

## 2020-02-23 DIAGNOSIS — M4316 Spondylolisthesis, lumbar region: Secondary | ICD-10-CM | POA: Diagnosis not present

## 2020-02-23 DIAGNOSIS — E119 Type 2 diabetes mellitus without complications: Secondary | ICD-10-CM | POA: Diagnosis not present

## 2020-02-23 DIAGNOSIS — Z6841 Body Mass Index (BMI) 40.0 and over, adult: Secondary | ICD-10-CM | POA: Diagnosis not present

## 2020-02-23 DIAGNOSIS — M170 Bilateral primary osteoarthritis of knee: Secondary | ICD-10-CM | POA: Diagnosis not present

## 2020-02-23 DIAGNOSIS — I119 Hypertensive heart disease without heart failure: Secondary | ICD-10-CM | POA: Diagnosis not present

## 2020-02-24 DIAGNOSIS — M48062 Spinal stenosis, lumbar region with neurogenic claudication: Secondary | ICD-10-CM | POA: Diagnosis not present

## 2020-02-25 DIAGNOSIS — M5116 Intervertebral disc disorders with radiculopathy, lumbar region: Secondary | ICD-10-CM | POA: Diagnosis not present

## 2020-02-25 DIAGNOSIS — I119 Hypertensive heart disease without heart failure: Secondary | ICD-10-CM | POA: Diagnosis not present

## 2020-02-25 DIAGNOSIS — M4316 Spondylolisthesis, lumbar region: Secondary | ICD-10-CM | POA: Diagnosis not present

## 2020-02-25 DIAGNOSIS — M170 Bilateral primary osteoarthritis of knee: Secondary | ICD-10-CM | POA: Diagnosis not present

## 2020-02-25 DIAGNOSIS — Z4789 Encounter for other orthopedic aftercare: Secondary | ICD-10-CM | POA: Diagnosis not present

## 2020-02-25 DIAGNOSIS — E785 Hyperlipidemia, unspecified: Secondary | ICD-10-CM | POA: Diagnosis not present

## 2020-02-25 DIAGNOSIS — Z6841 Body Mass Index (BMI) 40.0 and over, adult: Secondary | ICD-10-CM | POA: Diagnosis not present

## 2020-02-25 DIAGNOSIS — E119 Type 2 diabetes mellitus without complications: Secondary | ICD-10-CM | POA: Diagnosis not present

## 2020-02-25 DIAGNOSIS — E669 Obesity, unspecified: Secondary | ICD-10-CM | POA: Diagnosis not present

## 2020-03-01 DIAGNOSIS — E119 Type 2 diabetes mellitus without complications: Secondary | ICD-10-CM | POA: Diagnosis not present

## 2020-03-01 DIAGNOSIS — E669 Obesity, unspecified: Secondary | ICD-10-CM | POA: Diagnosis not present

## 2020-03-01 DIAGNOSIS — M170 Bilateral primary osteoarthritis of knee: Secondary | ICD-10-CM | POA: Diagnosis not present

## 2020-03-01 DIAGNOSIS — M5116 Intervertebral disc disorders with radiculopathy, lumbar region: Secondary | ICD-10-CM | POA: Diagnosis not present

## 2020-03-01 DIAGNOSIS — E785 Hyperlipidemia, unspecified: Secondary | ICD-10-CM | POA: Diagnosis not present

## 2020-03-01 DIAGNOSIS — Z4789 Encounter for other orthopedic aftercare: Secondary | ICD-10-CM | POA: Diagnosis not present

## 2020-03-01 DIAGNOSIS — I119 Hypertensive heart disease without heart failure: Secondary | ICD-10-CM | POA: Diagnosis not present

## 2020-03-01 DIAGNOSIS — Z6841 Body Mass Index (BMI) 40.0 and over, adult: Secondary | ICD-10-CM | POA: Diagnosis not present

## 2020-03-01 DIAGNOSIS — M4316 Spondylolisthesis, lumbar region: Secondary | ICD-10-CM | POA: Diagnosis not present

## 2020-03-10 ENCOUNTER — Other Ambulatory Visit: Payer: Self-pay

## 2020-03-10 ENCOUNTER — Encounter: Payer: Self-pay | Admitting: Nurse Practitioner

## 2020-03-10 ENCOUNTER — Ambulatory Visit (INDEPENDENT_AMBULATORY_CARE_PROVIDER_SITE_OTHER): Payer: Medicare HMO | Admitting: Nurse Practitioner

## 2020-03-10 VITALS — BP 137/69 | HR 71 | Temp 98.6°F | Resp 20 | Ht 61.0 in | Wt 277.0 lb

## 2020-03-10 DIAGNOSIS — I10 Essential (primary) hypertension: Secondary | ICD-10-CM | POA: Diagnosis not present

## 2020-03-10 DIAGNOSIS — E782 Mixed hyperlipidemia: Secondary | ICD-10-CM | POA: Diagnosis not present

## 2020-03-10 DIAGNOSIS — I509 Heart failure, unspecified: Secondary | ICD-10-CM

## 2020-03-10 DIAGNOSIS — E119 Type 2 diabetes mellitus without complications: Secondary | ICD-10-CM | POA: Diagnosis not present

## 2020-03-10 DIAGNOSIS — Z6841 Body Mass Index (BMI) 40.0 and over, adult: Secondary | ICD-10-CM | POA: Diagnosis not present

## 2020-03-10 NOTE — Progress Notes (Signed)
[place], [place]  [address] Phone:  [phone]   Fax:  [phone]   Established Patient Office Visit  Subjective:  Patient ID: Hannah Huerta, female    DOB: Sep 22, 1960  Age: 59 y.o. MRN: [id number]  CC:  Chief Complaint  Patient presents with  . Follow-up    HPI Hannah Huerta presents for follow up. She  has a past medical history of Abnormal Pap smear of cervix, Anemia, Arthritis, CAD in native artery (06/05/2015), Carpal tunnel syndrome, bilateral, Chest pain, CHF (congestive heart failure) ([place]), Chronic migraine, Diabetes mellitus without complication ([place]), GERD (gastroesophageal reflux disease), HLD (hyperlipidemia), Hypertension, Hypokalemia (30/16/0109), Metabolic syndrome (32/35/5732), Morbid obesity with BMI of 50.0-59.9, adult ([place]), Prediabetes, S/P cardiac catheterization, non obstructive disease 06/04/15 (06/05/2015), Sickle cell trait ([place]), Spondylolisthesis of lumbar region, Syncope, and Wears glasses.   Hypertension Patient is here for follow-up of elevated blood pressure. She is not exercising however she is currently in physical therapy status post her back surgery for which she is doing well and is adherent to a low-salt diet. Blood pressure is not monitored at home. Cardiac symptoms: none. Patient denies chest pain, dyspnea, fatigue, irregular heart beat, lower extremity edema, orthopnea, palpitations and syncope. Cardiovascular risk factors: diabetes mellitus, dyslipidemia, obesity (BMI >= 30 kg/m2) and sedentary lifestyle. Use of agents associated with hypertension: none. History of target organ damage: heart failure and Coronary artery disease previous episodes of syncope.  Diabetes Mellitus Patient presents for follow up of diabetes. Current symptoms include: nausea certain sweet foods. Symptoms have stabilized. Patient denies foot ulcerations, hypoglycemia , increased appetite, paresthesia of the feet, visual  disturbances and vomiting. Evaluation to date has included: fasting blood sugar, fasting lipid panel, hemoglobin A1C and microalbuminuria.  Home sugars: patient does not check sugars. Current treatment: Continued metformin which has been effective.  Past Medical History:  Diagnosis Date  . Abnormal Pap smear of cervix    pt couldnt state what type of abnormality  . Anemia   . Arthritis   . CAD in native artery 06/05/2015  . Carpal tunnel syndrome, bilateral   . Chest pain    a. 02/2012 abnl myoview - inferoapical reverisbility concerning for ischemia, EF 59%;   02/2012 nonobstructive cath  . CHF (congestive heart failure) ([place])   . Chronic migraine   . Diabetes mellitus without complication ([place])   . GERD (gastroesophageal reflux disease)   . HLD (hyperlipidemia)   . Hypertension   . Hypokalemia 06/05/2015  . Metabolic syndrome 20/25/4270  . Morbid obesity with BMI of 50.0-59.9, adult ([place])   . Prediabetes   . S/P cardiac catheterization, non obstructive disease 06/04/15 06/05/2015  . Sickle cell trait ([place])   . Spondylolisthesis of lumbar region   . Syncope   . Wears glasses     Past Surgical History:  Procedure Laterality Date  . BACK SURGERY     x1 lumbar fusion  . CARDIAC CATHETERIZATION N/A 06/04/2015   Procedure: Left Heart Cath and Coronary Angiography;  Surgeon: [name], MD;  Location: [facility];  Service: Cardiovascular;  Laterality: N/A;  . CHOLECYSTECTOMY     laparoscopic  . COLONOSCOPY WITH PROPOFOL N/A 09/04/2014   Procedure: COLONOSCOPY WITH PROPOFOL;  Surgeon: [name], MD;  Location: [facility];  Service: Endoscopy;  Laterality: N/A;  . LEFT HEART CATHETERIZATION WITH CORONARY ANGIOGRAM N/A 03/08/2012   Procedure: LEFT HEART CATHETERIZATION WITH CORONARY ANGIOGRAM;  Surgeon: Loretha Brasil  Lia Foyer, MD;  Location: [facility];  Service: Cardiovascular;  Laterality: N/A;  . SPINE SURGERY     fusion  . TUBAL LIGATION      Family History  Problem  Relation Age of Onset  . Coronary artery disease Father 34  . Heart disease Father   . Other Father        died in a MVA  . Hypertension Father   . Sudden death Mother 40       Questionable MI  . Diabetes Mother   . Hypertension Mother   . Hypertension Sister   . Pancreatic cancer Maternal Aunt   . Breast cancer Maternal Aunt        after age 32  . Stroke Paternal Grandmother   . Colon polyps Sister   . Colon cancer Neg Hx   . Gallbladder disease Neg Hx   . Esophageal cancer Neg Hx     Social History   Socioeconomic History  . Marital status: Widowed    Spouse name: Not on file  . Number of children: 3  . Years of education: Not on file  . Highest education level: Not on file  Occupational History  . Occupation: Works in the Estate manager/land agent: [employer] Use  . Smoking status: Never Smoker  . Smokeless tobacco: Never Used  Vaping Use  . Vaping Use: Never used  Substance and Sexual Activity  . Alcohol use: No  . Drug use: No  . Sexual activity: Yes    Birth control/protection: Surgical  Other Topics Concern  . Not on file  Social History Narrative   Lives at home alone.   Social Determinants of Health   Financial Resource Strain:   . Difficulty of Paying Living Expenses: Not on file  Food Insecurity:   . Worried About Charity fundraiser in the Last Year: Not on file  . Ran Out of Food in the Last Year: Not on file  Transportation Needs:   . Lack of Transportation (Medical): Not on file  . Lack of Transportation (Non-Medical): Not on file  Physical Activity:   . Days of Exercise per Week: Not on file  . Minutes of Exercise per Session: Not on file  Stress:   . Feeling of Stress : Not on file  Social Connections:   . Frequency of Communication with Friends and Family: Not on file  . Frequency of Social Gatherings with Friends and Family: Not on file  . Attends Religious Services: Not on file  . Active Member of Clubs or Organizations: Not on file   . Attends Archivist Meetings: Not on file  . Marital Status: Not on file  Intimate Partner Violence:   . Fear of Current or Ex-Partner: Not on file  . Emotionally Abused: Not on file  . Physically Abused: Not on file  . Sexually Abused: Not on file    Outpatient Medications Prior to Visit  Medication Sig Dispense Refill  . amLODipine (NORVASC) 5 MG tablet Take 1 tablet (5 mg total) by mouth daily. 90 tablet 3  . aspirin EC 81 MG tablet Take 1 tablet (81 mg total) by mouth daily. 30 tablet 11  . atorvastatin (LIPITOR) 40 MG tablet Take 1 tablet (40 mg total) by mouth daily at 6 PM. 90 tablet 3  . carvedilol (COREG) 6.25 MG tablet TAKE 1 TABLET BY MOUTH 2 TIMES DAILY WITH A MEAL. (Patient taking differently: Take 6.25 mg by mouth 2 (  two) times daily with a meal. TAKE 1 TABLET BY MOUTH 2 TIMES DAILY WITH A MEAL.) 180 tablet 3  . cyclobenzaprine (FLEXERIL) 10 MG tablet Take 1 tablet (10 mg total) by mouth 3 (three) times daily as needed for muscle spasms. 60 tablet 0  . metFORMIN (GLUCOPHAGE) 500 MG tablet Take 1 tablet (500 mg total) by mouth 2 (two) times daily with a meal. 90 tablet 3  . polyethylene glycol (MIRALAX) 17 g packet Take 17 g by mouth daily as needed for moderate constipation. (Patient not taking: Reported on 09/08/2019) 14 each 0  . spironolactone (ALDACTONE) 25 MG tablet Take 1 tablet (25 mg total) by mouth daily. 90 tablet 3   No facility-administered medications prior to visit.    No Known Allergies  ROS Review of Systems  All other systems reviewed and are negative.     Objective:    Physical Exam Constitutional:      General: She is not in acute distress.    Appearance: She is obese. She is not ill-appearing, toxic-appearing or diaphoretic.     Comments: Losing weight  HENT:     Head: Normocephalic and atraumatic.     Nose: Nose normal.     Mouth/Throat:     Mouth: Mucous membranes are moist.  Cardiovascular:     Rate and Rhythm: Normal rate  and regular rhythm.     Pulses: Normal pulses.     Heart sounds: Normal heart sounds.  Pulmonary:     Effort: Pulmonary effort is normal.     Breath sounds: Normal breath sounds.  Abdominal:     Palpations: Abdomen is soft.  Musculoskeletal:     Cervical back: Normal range of motion.     Right lower leg: No edema.     Left lower leg: No edema.     Comments: Back brace worn along with assistive device; cane  Skin:    General: Skin is warm and dry.     Capillary Refill: Capillary refill takes less than 2 seconds.  Neurological:     General: No focal deficit present.     Mental Status: She is alert and oriented to person, place, and time.  Psychiatric:        Mood and Affect: Mood normal.        Behavior: Behavior normal.        Thought Content: Thought content normal.        Judgment: Judgment normal.     BP 137/69   Pulse 71   Temp 98.6 F (37 C)   Resp 20   Ht 5\' 1"  (1.549 m)   Wt 277 lb (125.6 kg)   LMP 09/15/2013   SpO2 100%   BMI 52.34 kg/m  Wt Readings from Last 3 Encounters:  03/10/20 277 lb (125.6 kg)  02/05/20 281 lb (127.5 kg)  01/27/20 287 lb 1.6 oz (130.2 kg)     Health Maintenance Due  Topic Date Due  . MAMMOGRAM  05/19/2016  . PAP SMEAR-Modifier  11/20/2016  . URINE MICROALBUMIN  03/07/2018    There are no preventive care reminders to display for this patient.  Lab Results  Component Value Date   TSH 1.570 09/08/2019   Lab Results  Component Value Date   WBC 9.5 02/06/2020   HGB 9.1 (L) 02/06/2020   HCT 29.3 (L) 02/06/2020   MCV 79.8 (L) 02/06/2020   PLT 231 02/06/2020   Lab Results  Component Value Date   NA 137 02/06/2020  K 3.4 (L) 02/06/2020   CO2 28 02/06/2020   GLUCOSE 147 (H) 02/06/2020   BUN 16 02/06/2020   CREATININE 1.30 (H) 02/06/2020   BILITOT 0.3 12/08/2019   ALKPHOS 104 12/08/2019   AST 21 12/08/2019   ALT 13 02/05/2019   PROT 7.5 12/08/2019   ALBUMIN 4.2 12/08/2019   CALCIUM 8.9 02/06/2020   ANIONGAP 10  02/06/2020   Lab Results  Component Value Date   CHOL 148 12/08/2019   Lab Results  Component Value Date   HDL 53 12/08/2019   Lab Results  Component Value Date   LDLCALC 82 12/08/2019   Lab Results  Component Value Date   TRIG 65 12/08/2019   Lab Results  Component Value Date   CHOLHDL 2.8 12/08/2019   Lab Results  Component Value Date   HGBA1C 6.5 (H) 02/05/2020      Assessment & Plan:   Problem List Items Addressed This Visit      Cardiovascular and Mediastinum   CHF (congestive heart failure) (HCC) Encourage compliance with current regimen Daily weight D ASH diet Continue weight loss and regular exercise   HTN (hypertension) - Primary Encouraged on going compliance with current medication regimen Encouraged home monitoring and recording BP <130/80 Eating a heart-healthy diet with less salt Encouraged regular physical activity  Recommend Weight loss        Other   Hyperlipidemia Continue to monitor diet high in fat and cholesterol Continue compliance with current regimen   Relevant Orders   Lipid panel   Morbid obesity with BMI of 50.0-59.9, adult ([place]) Obesity with BMI and comorbidities as noted above.  Discussed proper diet (low fat, low sodium, high fiber) with patient.   Discussed need for regular exercise (3 times per week, 20 minutes per session) with patient.     Other Visit Diagnoses    Type 2 diabetes mellitus without complication, without long-term current use of insulin (HCC)     Encourage compliance with current treatment regimen   Encourage regular CBG monitoring Encourage contacting office if excessive hyperglycemia and or hypoglycemia Lifestyle modification with healthy diet (fewer calories, more high fiber foods, whole grains and non-starchy vegetables, lower fat meat and fish, low-fat diary include healthy oils) regular exercise (physical activity) and weight loss Opthalmology exam discussed  Nutritional consult  recommended Regular dental visits encouraged Home BP monitoring also encouraged goal <130/80     Relevant Orders   Microalbumin, urine   Comp. Metabolic Panel (12)      No orders of the defined types were placed in this encounter.   Follow-up: Return in about 6 weeks (around 04/21/2020) for well woman physcial then a 3 month fu thanks.    Vevelyn Francois, NP

## 2020-03-10 NOTE — Patient Instructions (Addendum)
Healthy Eating Following a healthy eating pattern may help you to achieve and maintain a healthy body weight, reduce the risk of chronic disease, and live a long and productive life. It is important to follow a healthy eating pattern at an appropriate calorie level for your body. Your nutritional needs should be met primarily through food by choosing a variety of nutrient-rich foods. What are tips for following this plan? Reading food labels  Read labels and choose the following: ? Reduced or low sodium. ? Juices with 100% fruit juice. ? Foods with low saturated fats and high polyunsaturated and monounsaturated fats. ? Foods with whole grains, such as whole wheat, cracked wheat, brown rice, and wild rice. ? Whole grains that are fortified with folic acid. This is recommended for women who are pregnant or who want to become pregnant.  Read labels and avoid the following: ? Foods with a lot of added sugars. These include foods that contain brown sugar, corn sweetener, corn syrup, dextrose, fructose, glucose, high-fructose corn syrup, honey, invert sugar, lactose, malt syrup, maltose, molasses, raw sugar, sucrose, trehalose, or turbinado sugar.  Do not eat more than the following amounts of added sugar per day:  6 teaspoons (25 g) for women.  9 teaspoons (38 g) for men. ? Foods that contain processed or refined starches and grains. ? Refined grain products, such as white flour, degermed cornmeal, white bread, and white rice. Shopping  Choose nutrient-rich snacks, such as vegetables, whole fruits, and nuts. Avoid high-calorie and high-sugar snacks, such as potato chips, fruit snacks, and candy.  Use oil-based dressings and spreads on foods instead of solid fats such as butter, stick margarine, or cream cheese.  Limit pre-made sauces, mixes, and "instant" products such as flavored rice, instant noodles, and ready-made pasta.  Try more plant-protein sources, such as tofu, tempeh, black beans,  edamame, lentils, nuts, and seeds.  Explore eating plans such as the Mediterranean diet or vegetarian diet. Cooking  Use oil to saut or stir-fry foods instead of solid fats such as butter, stick margarine, or lard.  Try baking, boiling, grilling, or broiling instead of frying.  Remove the fatty part of meats before cooking.  Steam vegetables in water or broth. Meal planning   At meals, imagine dividing your plate into fourths: ? One-half of your plate is fruits and vegetables. ? One-fourth of your plate is whole grains. ? One-fourth of your plate is protein, especially lean meats, poultry, eggs, tofu, beans, or nuts.  Include low-fat dairy as part of your daily diet. Lifestyle  Choose healthy options in all settings, including home, work, school, restaurants, or stores.  Prepare your food safely: ? Wash your hands after handling raw meats. ? Keep food preparation surfaces clean by regularly washing with hot, soapy water. ? Keep raw meats separate from ready-to-eat foods, such as fruits and vegetables. ? Cook seafood, meat, poultry, and eggs to the recommended internal temperature. ? Store foods at safe temperatures. In general:  Keep cold foods at 59F (4.4C) or below.  Keep hot foods at 159F (60C) or above.  Keep your freezer at [facility] (-17.8C) or below.  Foods are no longer safe to eat when they have been between the temperatures of 40-159F (4.4-60C) for more than 2 hours. What foods should I eat? Fruits Aim to eat 2 cup-equivalents of fresh, canned (in natural juice), or frozen fruits each day. Examples of 1 cup-equivalent of fruit include 1 small apple, 8 large strawberries, 1 cup canned fruit,  cup  dried fruit, or 1 cup 100% juice. Vegetables Aim to eat 2-3 cup-equivalents of fresh and frozen vegetables each day, including different varieties and colors. Examples of 1 cup-equivalent of vegetables include 2 medium carrots, 2 cups raw, leafy greens, 1 cup chopped  vegetable (raw or cooked), or 1 medium baked potato. Grains Aim to eat 6 ounce-equivalents of whole grains each day. Examples of 1 ounce-equivalent of grains include 1 slice of bread, 1 cup ready-to-eat cereal, 3 cups popcorn, or  cup cooked rice, pasta, or cereal. Meats and other proteins Aim to eat 5-6 ounce-equivalents of protein each day. Examples of 1 ounce-equivalent of protein include 1 egg, 1/2 cup nuts or seeds, or 1 tablespoon (16 g) peanut butter. A cut of meat or fish that is the size of a deck of cards is about 3-4 ounce-equivalents.  Of the protein you eat each week, try to have at least 8 ounces come from seafood. This includes salmon, trout, herring, and anchovies. Dairy Aim to eat 3 cup-equivalents of fat-free or low-fat dairy each day. Examples of 1 cup-equivalent of dairy include 1 cup (240 mL) milk, 8 ounces (250 g) yogurt, 1 ounces (44 g) natural cheese, or 1 cup (240 mL) fortified soy milk. Fats and oils  Aim for about 5 teaspoons (21 g) per day. Choose monounsaturated fats, such as canola and olive oils, avocados, peanut butter, and most nuts, or polyunsaturated fats, such as sunflower, corn, and soybean oils, walnuts, pine nuts, sesame seeds, sunflower seeds, and flaxseed. Beverages  Aim for six 8-oz glasses of water per day. Limit coffee to three to five 8-oz cups per day.  Limit caffeinated beverages that have added calories, such as soda and energy drinks.  Limit alcohol intake to no more than 1 drink a day for nonpregnant women and 2 drinks a day for men. One drink equals 12 oz of beer (355 mL), 5 oz of wine (148 mL), or 1 oz of hard liquor (44 mL). Seasoning and other foods  Avoid adding excess amounts of salt to your foods. Try flavoring foods with herbs and spices instead of salt.  Avoid adding sugar to foods.  Try using oil-based dressings, sauces, and spreads instead of solid fats. This information is based on general [place] nutrition guidelines. For more  information, visit [employer]. Exact amounts may vary based on your nutrition needs. Summary  A healthy eating plan may help you to maintain a healthy weight, reduce the risk of chronic diseases, and stay active throughout your life.  Plan your meals. Make sure you eat the right portions of a variety of nutrient-rich foods.  Try baking, boiling, grilling, or broiling instead of frying.  Choose healthy options in all settings, including home, work, school, restaurants, or stores. This information is not intended to replace advice given to you by your health care provider. Make sure you discuss any questions you have with your health care provider. Document Revised: 10/15/2017 Document Reviewed: 10/15/2017 Elsevier Patient Education  2020 [employer]  Diabetes Mellitus and Nutrition, Adult When you have diabetes (diabetes mellitus), it is very important to have healthy eating habits because your blood sugar (glucose) levels are greatly affected by what you eat and drink. Eating healthy foods in the appropriate amounts, at about the same times every day, can help you:  Control your blood glucose.  Lower your risk of heart disease.  Improve your blood pressure.  Reach or maintain a healthy weight. Every person with diabetes is different, and each person  has different needs for a meal plan. Your health care provider may recommend that you work with a diet and nutrition specialist (dietitian) to make a meal plan that is best for you. Your meal plan may vary depending on factors such as:  The calories you need.  The medicines you take.  Your weight.  Your blood glucose, blood pressure, and cholesterol levels.  Your activity level.  Other health conditions you have, such as heart or kidney disease. How do carbohydrates affect me? Carbohydrates, also called carbs, affect your blood glucose level more than any other type of food. Eating carbs naturally raises the amount of  glucose in your blood. Carb counting is a method for keeping track of how many carbs you eat. Counting carbs is important to keep your blood glucose at a healthy level, especially if you use insulin or take certain oral diabetes medicines. It is important to know how many carbs you can safely have in each meal. This is different for every person. Your dietitian can help you calculate how many carbs you should have at each meal and for each snack. Foods that contain carbs include:  Bread, cereal, rice, pasta, and crackers.  Potatoes and corn.  Peas, beans, and lentils.  Milk and yogurt.  Fruit and juice.  Desserts, such as cakes, cookies, ice cream, and candy. How does alcohol affect me? Alcohol can cause a sudden decrease in blood glucose (hypoglycemia), especially if you use insulin or take certain oral diabetes medicines. Hypoglycemia can be a life-threatening condition. Symptoms of hypoglycemia (sleepiness, dizziness, and confusion) are similar to symptoms of having too much alcohol. If your health care provider says that alcohol is safe for you, follow these guidelines:  Limit alcohol intake to no more than 1 drink per day for nonpregnant women and 2 drinks per day for men. One drink equals 12 oz of beer, 5 oz of wine, or 1 oz of hard liquor.  Do not drink on an empty stomach.  Keep yourself hydrated with water, diet soda, or unsweetened iced tea.  Keep in mind that regular soda, juice, and other mixers may contain a lot of sugar and must be counted as carbs. What are tips for following this plan?  Reading food labels  Start by checking the serving size on the "Nutrition Facts" label of packaged foods and drinks. The amount of calories, carbs, fats, and other nutrients listed on the label is based on one serving of the item. Many items contain more than one serving per package.  Check the total grams (g) of carbs in one serving. You can calculate the number of servings of carbs  in one serving by dividing the total carbs by 15. For example, if a food has 30 g of total carbs, it would be equal to 2 servings of carbs.  Check the number of grams (g) of saturated and trans fats in one serving. Choose foods that have low or no amount of these fats.  Check the number of milligrams (mg) of salt (sodium) in one serving. Most people should limit total sodium intake to less than 2,300 mg per day.  Always check the nutrition information of foods labeled as "low-fat" or "nonfat". These foods may be higher in added sugar or refined carbs and should be avoided.  Talk to your dietitian to identify your daily goals for nutrients listed on the label. Shopping  Avoid buying canned, premade, or processed foods. These foods tend to be high in fat, sodium,  and added sugar.  Shop around the outside edge of the grocery store. This includes fresh fruits and vegetables, bulk grains, fresh meats, and fresh dairy. Cooking  Use low-heat cooking methods, such as baking, instead of high-heat cooking methods like deep frying.  Cook using healthy oils, such as olive, canola, or sunflower oil.  Avoid cooking with butter, cream, or high-fat meats. Meal planning  Eat meals and snacks regularly, preferably at the same times every day. Avoid going long periods of time without eating.  Eat foods high in fiber, such as fresh fruits, vegetables, beans, and whole grains. Talk to your dietitian about how many servings of carbs you can eat at each meal.  Eat 4-6 ounces (oz) of lean protein each day, such as lean meat, chicken, fish, eggs, or tofu. One oz of lean protein is equal to: ? 1 oz of meat, chicken, or fish. ? 1 egg. ?  cup of tofu.  Eat some foods each day that contain healthy fats, such as avocado, nuts, seeds, and fish. Lifestyle  Check your blood glucose regularly.  Exercise regularly as told by your health care provider. This may include: ? 150 minutes of moderate-intensity or  vigorous-intensity exercise each week. This could be brisk walking, biking, or water aerobics. ? Stretching and doing strength exercises, such as yoga or weightlifting, at least 2 times a week.  Take medicines as told by your health care provider.  Do not use any products that contain nicotine or tobacco, such as cigarettes and e-cigarettes. If you need help quitting, ask your health care provider.  Work with a Social worker or diabetes educator to identify strategies to manage stress and any emotional and social challenges. Questions to ask a health care provider  Do I need to meet with a diabetes educator?  Do I need to meet with a dietitian?  What number can I call if I have questions?  When are the best times to check my blood glucose? Where to find more information:  American Diabetes Association: diabetes.org  Academy of Nutrition and Dietetics: www.eatright.[employer] of Diabetes and Digestive and Kidney Diseases (NIH): [employer] Summary  A healthy meal plan will help you control your blood glucose and maintain a healthy lifestyle.  Working with a diet and nutrition specialist (dietitian) can help you make a meal plan that is best for you.  Keep in mind that carbohydrates (carbs) and alcohol have immediate effects on your blood glucose levels. It is important to count carbs and to use alcohol carefully. This information is not intended to replace advice given to you by your health care provider. Make sure you discuss any questions you have with your health care provider. Document Revised: 06/15/2017 Document Reviewed: 08/07/2016 Elsevier Patient Education  2020 [employer].

## 2020-03-11 LAB — LIPID PANEL
Chol/HDL Ratio: 2.8 ratio (ref 0.0–4.4)
Cholesterol, Total: 138 mg/dL (ref 100–199)
HDL: 49 mg/dL (ref >39)
LDL Chol Calc (NIH): 74 mg/dL (ref 0–99)
Triglycerides: 74 mg/dL (ref 0–149)
VLDL Cholesterol Cal: 15 mg/dL (ref 5–40)

## 2020-03-11 LAB — COMP. METABOLIC PANEL (12)
AST: 23 IU/L (ref 0–40)
Albumin/Globulin Ratio: 1.2 (ref 1.2–2.2)
Albumin: 4.3 g/dL (ref 3.8–4.9)
Alkaline Phosphatase: 136 IU/L — ABNORMAL HIGH (ref 48–121)
BUN/Creatinine Ratio: 14 (ref 9–23)
BUN: 13 mg/dL (ref 6–24)
Bilirubin Total: 0.3 mg/dL (ref 0.0–1.2)
Calcium: 9.8 mg/dL (ref 8.7–10.2)
Chloride: 101 mmol/L (ref 96–106)
Creatinine, Ser: 0.94 mg/dL (ref 0.57–1.00)
GFR calc Af Amer: 77 mL/min/{1.73_m2} (ref >59)
GFR calc non Af Amer: 67 mL/min/{1.73_m2} (ref >59)
Globulin, Total: 3.5 g/dL (ref 1.5–4.5)
Glucose: 116 mg/dL — ABNORMAL HIGH (ref 65–99)
Potassium: 4.3 mmol/L (ref 3.5–5.2)
Sodium: 138 mmol/L (ref 134–144)
Total Protein: 7.8 g/dL (ref 6.0–8.5)

## 2020-03-11 LAB — MICROALBUMIN, URINE: Microalbumin, Urine: 31.7 ug/mL

## 2020-03-12 DIAGNOSIS — Z4789 Encounter for other orthopedic aftercare: Secondary | ICD-10-CM | POA: Diagnosis not present

## 2020-04-21 ENCOUNTER — Ambulatory Visit: Payer: Medicare HMO | Admitting: Nurse Practitioner

## 2020-06-14 ENCOUNTER — Ambulatory Visit: Payer: Medicare HMO | Admitting: Nurse Practitioner

## 2020-07-06 DIAGNOSIS — M48062 Spinal stenosis, lumbar region with neurogenic claudication: Secondary | ICD-10-CM | POA: Diagnosis not present

## 2020-07-06 DIAGNOSIS — Z6841 Body Mass Index (BMI) 40.0 and over, adult: Secondary | ICD-10-CM | POA: Diagnosis not present

## 2020-07-06 DIAGNOSIS — M4316 Spondylolisthesis, lumbar region: Secondary | ICD-10-CM | POA: Diagnosis not present

## 2020-08-23 IMAGING — MR MR LUMBAR SPINE W/O CM
4 of 5 series · 25 of 48 positions shown · non-contrast
Comparison: None.

CLINICAL DATA: Low back pain radiating to the right buttock.
Worsening over last 6 months.

EXAM:
MRI LUMBAR SPINE WITHOUT CONTRAST
TECHNIQUE: Multiplanar, multisequence MR imaging of the lumbar spine was
performed. No intravenous contrast was administered.

[Series 3: T2 · sagittal · 4.0mm · 0.55mm/px · 7 of 15 slices shown (1 of 2)]
[im 1/15]
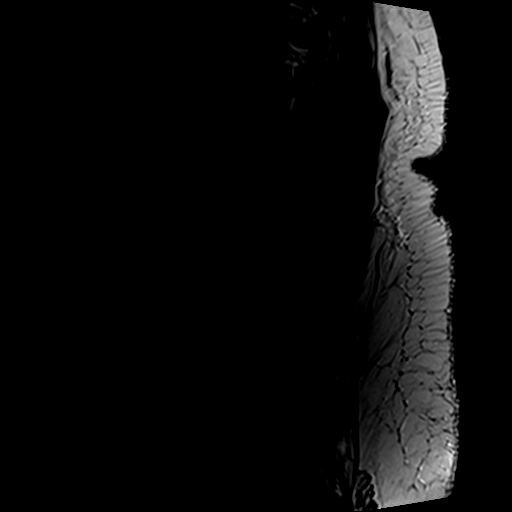
[im 3/15]
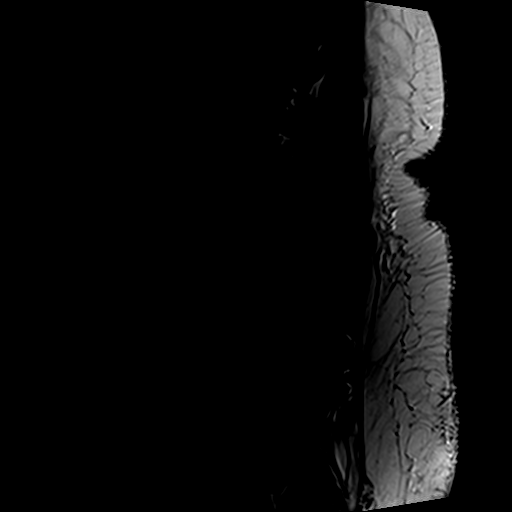
[im 5/15]
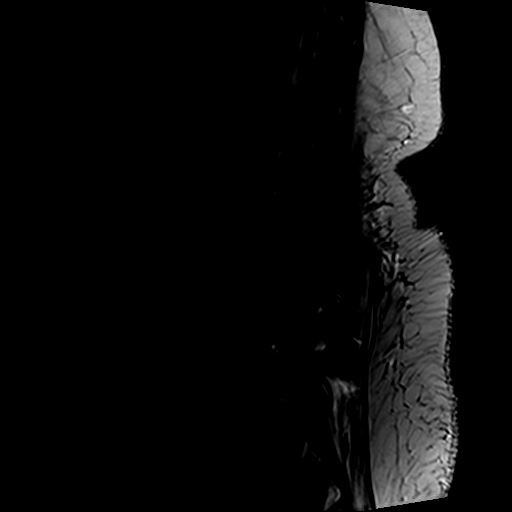
[im 8/15]
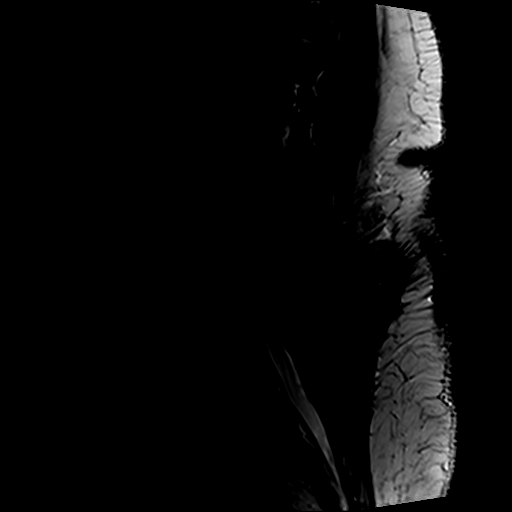
[im 10/15]
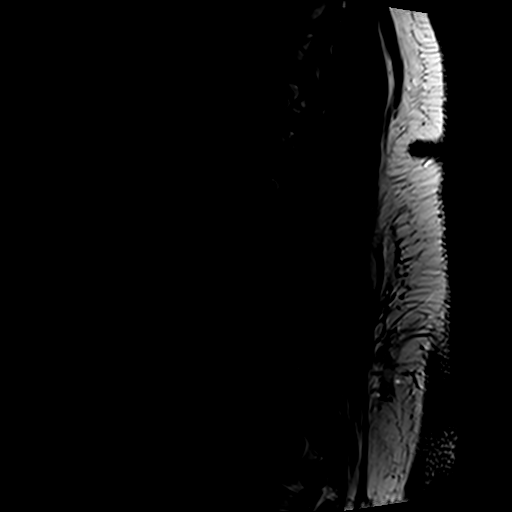
[im 12/15]
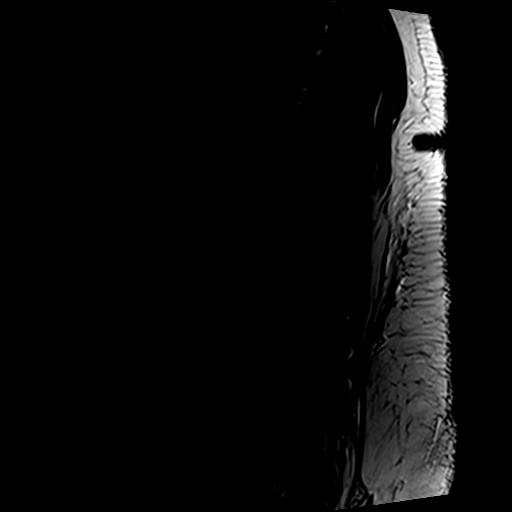
[im 15/15]
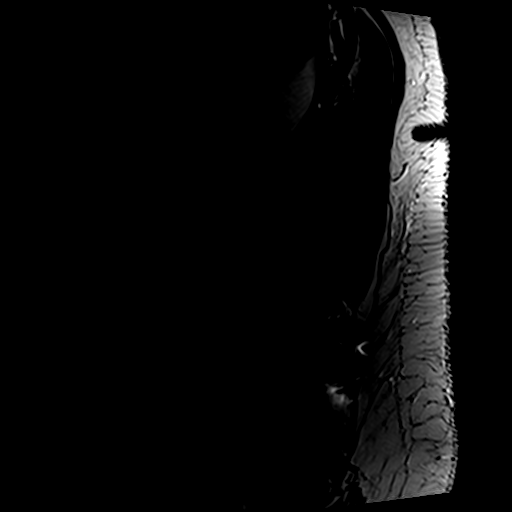

[Series 4: T1 · sagittal · 4.0mm · 0.55mm/px · 7 of 15 slices shown (1 of 2)]
[im 1/15]
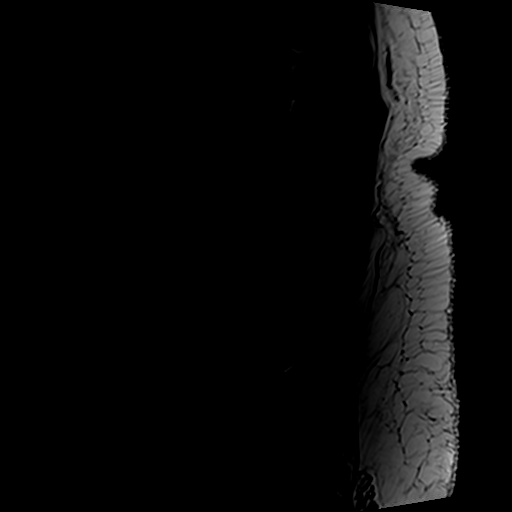
[im 3/15]
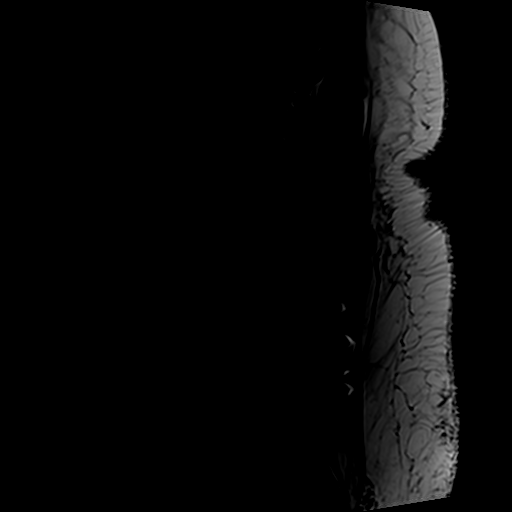
[im 5/15]
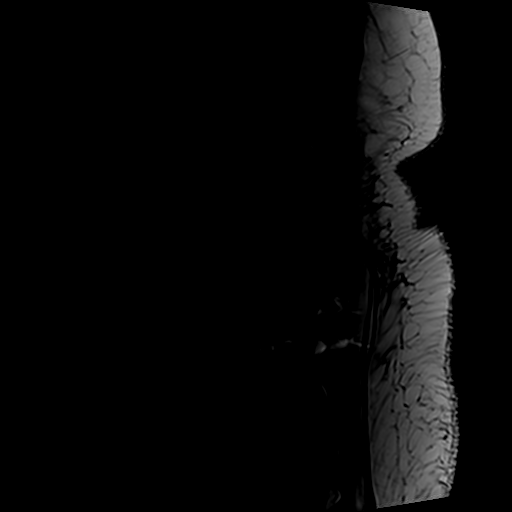
[im 8/15]
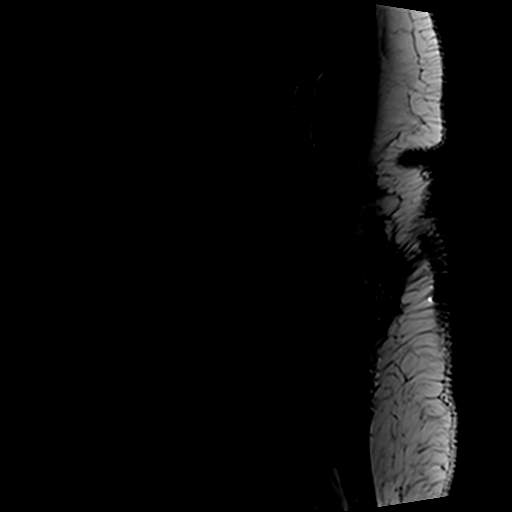
[im 10/15]
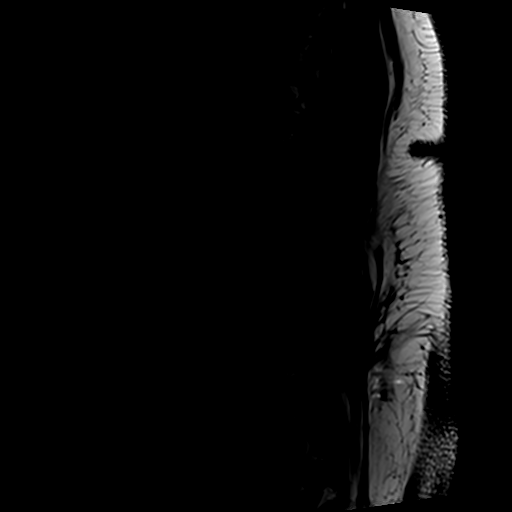
[im 12/15]
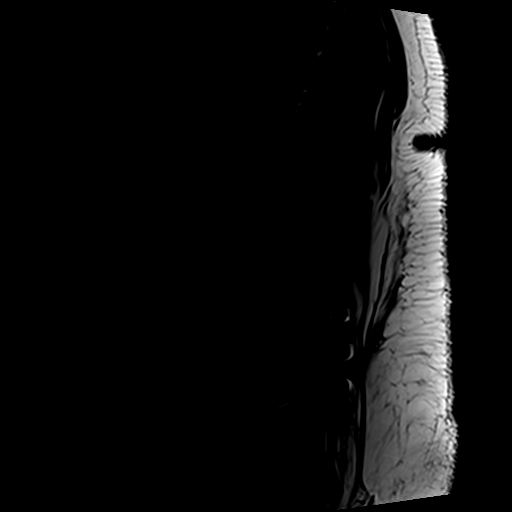
[im 15/15]
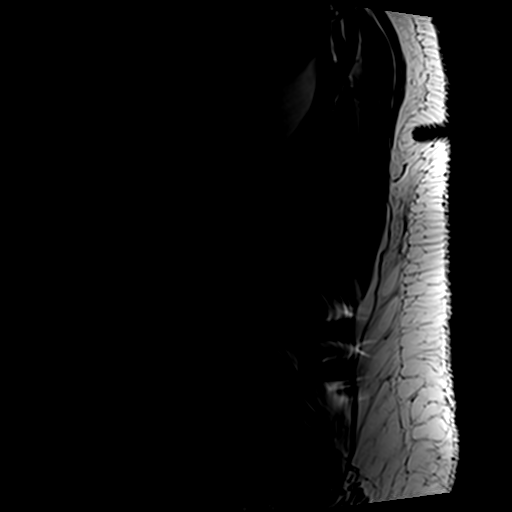

[Series 6: T2 · axial · 4.0mm · 0.70mm/px · z∈[-30,+170]mm · 8 of 34 slices shown (2 of 2)]
[im 1/34]
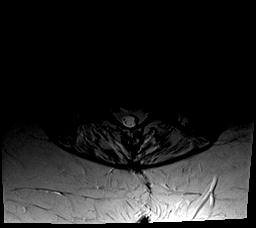
[im 6/34]
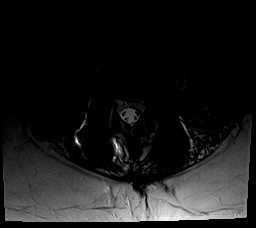
[im 11/34]
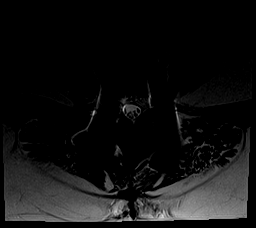
[im 16/34]
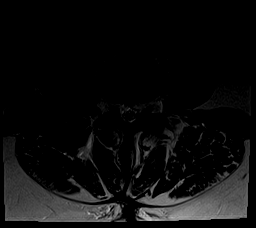
[im 18/34]
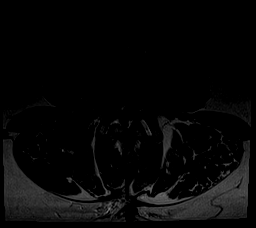
[im 23/34]
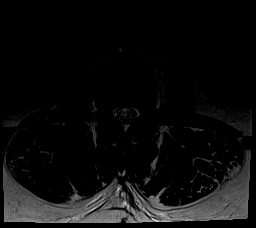
[im 28/34]
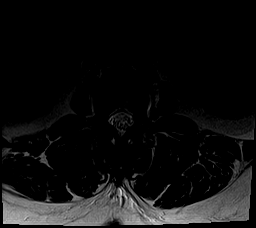
[im 34/34]
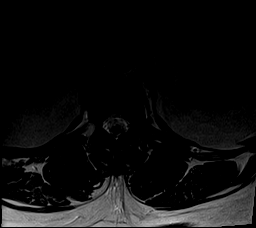

[Series 7: T1 · axial · 4.0mm · 0.35mm/px · z∈[-5,+140]mm · 3 of 34 slices shown (2 of 2)]
[im 6/34]
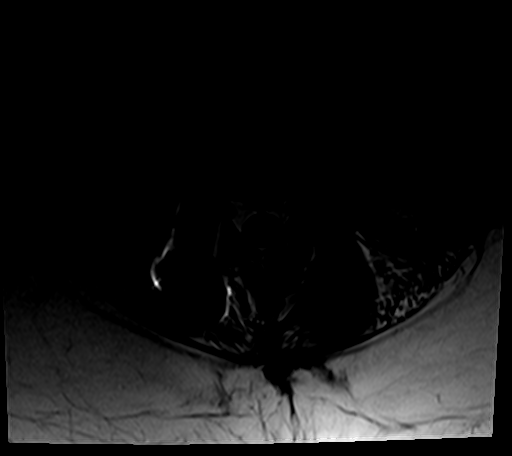
[im 18/34]
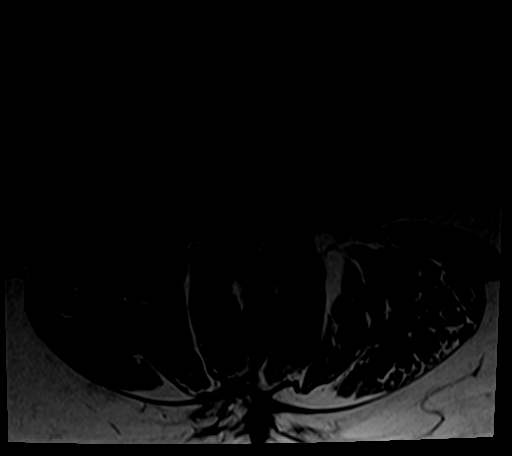
[im 28/34]
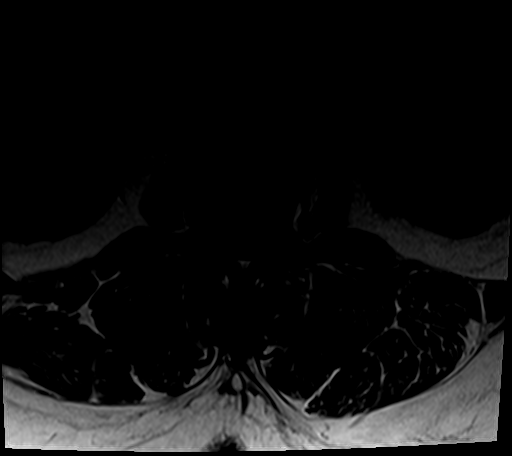

[25 of 48 positions shown; findings below may reference images not displayed]

FINDINGS: Segmentation:  Standard.

Alignment:  Physiologic.

Vertebrae:  L4-5 PLIF

Conus medullaris and cauda equina: Conus extends to the L1 level.
Conus and cauda equina appear normal.

Paraspinal and other soft tissues: Postsurgical changes at the L4-L5
levels.

Disc levels:

T11-12: Sagittal imaging only. Right subarticular disc extrusion
with minimal superior migration mild right foraminal stenosis. There
is also mild left foraminal stenosis.

T12-L1: Normal.

L1-L2: Normal disc space and facet joints. There is no spinal canal
stenosis. No neural foraminal stenosis.

L2-L3: New disc bulge with moderate facet hypertrophy and widening
of the facets. Mild spinal canal stenosis. Severe right and moderate
left neural foraminal stenosis.

L3-L4: Severe facet hypertrophy. Mild disc bulge. Mild spinal canal
stenosis. Mild bilateral neural foraminal stenosis.

L4-L5: PLIF. There is no spinal canal stenosis. No neural foraminal
stenosis.

L5-S1: Normal disc space and facet joints. There is no spinal canal
stenosis. No neural foraminal stenosis.

Visualized sacrum: Normal.
IMPRESSION: 1. L4-5 PLIF without residual spinal canal or neural foraminal
stenosis.
2. L2-3 mild spinal canal stenosis with severe right and moderate
left neural foraminal stenosis.
3. Severe facet arthrosis at L3-L4 with mild spinal canal and
bilateral neural foraminal stenosis.

## 2020-09-21 ENCOUNTER — Telehealth: Payer: Self-pay

## 2020-09-21 NOTE — Telephone Encounter (Signed)
What she she do about the fybriod tumer she has. Your in pain.

## 2020-09-22 ENCOUNTER — Other Ambulatory Visit: Payer: Self-pay | Admitting: Nurse Practitioner

## 2020-09-22 DIAGNOSIS — D259 Leiomyoma of uterus, unspecified: Secondary | ICD-10-CM

## 2020-09-22 NOTE — Progress Notes (Signed)
   [address], [place]  [address] Phone:  [phone]   Fax:  [phone]  Patient endorses Uterine fibroids requesting assistance

## 2020-09-22 NOTE — Telephone Encounter (Signed)
I will place her a referral to GYN . They will come together and come up with a plan.

## 2020-10-20 ENCOUNTER — Other Ambulatory Visit: Payer: Self-pay

## 2020-10-20 ENCOUNTER — Other Ambulatory Visit: Payer: Self-pay | Admitting: Nurse Practitioner

## 2020-10-20 DIAGNOSIS — I10 Essential (primary) hypertension: Secondary | ICD-10-CM

## 2020-10-20 DIAGNOSIS — E7849 Other hyperlipidemia: Secondary | ICD-10-CM

## 2020-10-20 MED ORDER — CARVEDILOL 6.25 MG PO TABS: 6.2500 mg | ORAL_TABLET | Freq: Two times a day (BID) | ORAL | 0 refills | Status: DC

## 2020-10-20 MED ORDER — SPIRONOLACTONE 25 MG PO TABS: 25.0000 mg | ORAL_TABLET | Freq: Every day | ORAL | 0 refills | Status: DC

## 2020-10-27 ENCOUNTER — Ambulatory Visit (INDEPENDENT_AMBULATORY_CARE_PROVIDER_SITE_OTHER): Payer: Medicare Other | Admitting: Nurse Practitioner

## 2020-10-27 ENCOUNTER — Encounter: Payer: Self-pay | Admitting: Nurse Practitioner

## 2020-10-27 ENCOUNTER — Other Ambulatory Visit: Payer: Self-pay

## 2020-10-27 VITALS — BP 144/85 | HR 75 | Temp 97.5°F | Ht 61.0 in | Wt 271.0 lb

## 2020-10-27 DIAGNOSIS — Z1231 Encounter for screening mammogram for malignant neoplasm of breast: Secondary | ICD-10-CM

## 2020-10-27 DIAGNOSIS — N6001 Solitary cyst of right breast: Secondary | ICD-10-CM

## 2020-10-27 DIAGNOSIS — I10 Essential (primary) hypertension: Secondary | ICD-10-CM

## 2020-10-27 DIAGNOSIS — E119 Type 2 diabetes mellitus without complications: Secondary | ICD-10-CM

## 2020-10-27 DIAGNOSIS — Z130 Encounter for screening for diseases of the blood and blood-forming organs and certain disorders involving the immune mechanism: Secondary | ICD-10-CM

## 2020-10-27 DIAGNOSIS — E7849 Other hyperlipidemia: Secondary | ICD-10-CM

## 2020-10-27 DIAGNOSIS — R7303 Prediabetes: Secondary | ICD-10-CM | POA: Diagnosis not present

## 2020-10-27 DIAGNOSIS — Z6841 Body Mass Index (BMI) 40.0 and over, adult: Secondary | ICD-10-CM

## 2020-10-27 LAB — POCT URINALYSIS DIPSTICK
Bilirubin, UA: NEGATIVE
Glucose, UA: NEGATIVE
Ketones, UA: NEGATIVE
Leukocytes, UA: NEGATIVE
Nitrite, UA: NEGATIVE
Protein, UA: NEGATIVE
Spec Grav, UA: 1.02 (ref 1.010–1.025)
Urobilinogen, UA: 0.2 E.U./dL
pH, UA: 5.5 (ref 5.0–8.0)

## 2020-10-27 LAB — POCT GLYCOSYLATED HEMOGLOBIN (HGB A1C)
HbA1c POC (<> result, manual entry): 5.8 % (ref 4.0–5.6)
HbA1c, POC (controlled diabetic range): 5.8 % (ref 0.0–7.0)
HbA1c, POC (prediabetic range): 5.8 % (ref 5.7–6.4)
Hemoglobin A1C: 5.8 % — AB (ref 4.0–5.6)

## 2020-10-27 MED ORDER — AMLODIPINE BESYLATE 5 MG PO TABS: 5.0000 mg | ORAL_TABLET | Freq: Every day | ORAL | 3 refills | Status: DC

## 2020-10-27 MED ORDER — ATORVASTATIN CALCIUM 40 MG PO TABS: 40.0000 mg | ORAL_TABLET | Freq: Every day | ORAL | 0 refills | Status: DC

## 2020-10-27 MED ORDER — METFORMIN HCL 500 MG PO TABS: 500.0000 mg | ORAL_TABLET | Freq: Two times a day (BID) | ORAL | 3 refills | Status: DC

## 2020-10-27 MED ORDER — SPIRONOLACTONE 25 MG PO TABS: 25.0000 mg | ORAL_TABLET | Freq: Every day | ORAL | 3 refills | Status: DC

## 2020-10-27 MED ORDER — CARVEDILOL 6.25 MG PO TABS: 6.2500 mg | ORAL_TABLET | Freq: Two times a day (BID) | ORAL | 3 refills | Status: DC

## 2020-10-27 NOTE — Patient Instructions (Addendum)
Diabetes Mellitus and Nutrition, Adult When you have diabetes, or diabetes mellitus, it is very important to have healthy eating habits because your blood sugar (glucose) levels are greatly affected by what you eat and drink. Eating healthy foods in the right amounts, at about the same times every day, can help you:  Control your blood glucose.  Lower your risk of heart disease.  Improve your blood pressure.  Reach or maintain a healthy weight. What can affect my meal plan? Every person with diabetes is different, and each person has different needs for a meal plan. Your health care provider may recommend that you work with a dietitian to make a meal plan that is best for you. Your meal plan may vary depending on factors such as:  The calories you need.  The medicines you take.  Your weight.  Your blood glucose, blood pressure, and cholesterol levels.  Your activity level.  Other health conditions you have, such as heart or kidney disease. How do carbohydrates affect me? Carbohydrates, also called carbs, affect your blood glucose level more than any other type of food. Eating carbs naturally raises the amount of glucose in your blood. Carb counting is a method for keeping track of how many carbs you eat. Counting carbs is important to keep your blood glucose at a healthy level, especially if you use insulin or take certain oral diabetes medicines. It is important to know how many carbs you can safely have in each meal. This is different for every person. Your dietitian can help you calculate how many carbs you should have at each meal and for each snack. How does alcohol affect me? Alcohol can cause a sudden decrease in blood glucose (hypoglycemia), especially if you use insulin or take certain oral diabetes medicines. Hypoglycemia can be a life-threatening condition. Symptoms of hypoglycemia, such as sleepiness, dizziness, and confusion, are similar to symptoms of having too much  alcohol.  Do not drink alcohol if: ? Your health care provider tells you not to drink. ? You are pregnant, may be pregnant, or are planning to become pregnant.  If you drink alcohol: ? Do not drink on an empty stomach. ? Limit how much you use to:  0-1 drink a day for women.  0-2 drinks a day for men. ? Be aware of how much alcohol is in your drink. In the [place] one drink equals one 12 oz bottle of beer (355 mL), one 5 oz glass of wine (148 mL), or one 1 oz glass of hard liquor (44 mL). ? Keep yourself hydrated with water, diet soda, or unsweetened iced tea.  Keep in mind that regular soda, juice, and other mixers may contain a lot of sugar and must be counted as carbs. What are tips for following this plan? Reading food labels  Start by checking the serving size on the "Nutrition Facts" label of packaged foods and drinks. The amount of calories, carbs, fats, and other nutrients listed on the label is based on one serving of the item. Many items contain more than one serving per package.  Check the total grams (g) of carbs in one serving. You can calculate the number of servings of carbs in one serving by dividing the total carbs by 15. For example, if a food has 30 g of total carbs per serving, it would be equal to 2 servings of carbs.  Check the number of grams (g) of saturated fats and trans fats in one serving. Choose foods that have   a low amount or none of these fats.  Check the number of milligrams (mg) of salt (sodium) in one serving. Most people should limit total sodium intake to less than 2,300 mg per day.  Always check the nutrition information of foods labeled as "low-fat" or "nonfat." These foods may be higher in added sugar or refined carbs and should be avoided.  Talk to your dietitian to identify your daily goals for nutrients listed on the label. Shopping  Avoid buying canned, pre-made, or processed foods. These foods tend to be high in fat, sodium, and added  sugar.  Shop around the outside edge of the grocery store. This is where you will most often find fresh fruits and vegetables, bulk grains, fresh meats, and fresh dairy. Cooking  Use low-heat cooking methods, such as baking, instead of high-heat cooking methods like deep frying.  Cook using healthy oils, such as olive, canola, or sunflower oil.  Avoid cooking with butter, cream, or high-fat meats. Meal planning  Eat meals and snacks regularly, preferably at the same times every day. Avoid going long periods of time without eating.  Eat foods that are high in fiber, such as fresh fruits, vegetables, beans, and whole grains. Talk with your dietitian about how many servings of carbs you can eat at each meal.  Eat 4-6 oz (112-168 g) of lean protein each day, such as lean meat, chicken, fish, eggs, or tofu. One ounce (oz) of lean protein is equal to: ? 1 oz (28 g) of meat, chicken, or fish. ? 1 egg. ?  cup (62 g) of tofu.  Eat some foods each day that contain healthy fats, such as avocado, nuts, seeds, and fish.   What foods should I eat? Fruits Berries. Apples. Oranges. Peaches. Apricots. Plums. Grapes. Mango. Papaya. Pomegranate. Kiwi. Cherries. Vegetables Lettuce. Spinach. Leafy greens, including kale, chard, collard greens, and mustard greens. Beets. Cauliflower. Cabbage. Broccoli. Carrots. Green beans. Tomatoes. Peppers. Onions. Cucumbers. Brussels sprouts. Grains Whole grains, such as whole-wheat or whole-grain bread, crackers, tortillas, cereal, and pasta. Unsweetened oatmeal. Quinoa. Brown or wild rice. Meats and other proteins Seafood. Poultry without skin. Lean cuts of poultry and beef. Tofu. Nuts. Seeds. Dairy Low-fat or fat-free dairy products such as milk, yogurt, and cheese. The items listed above may not be a complete list of foods and beverages you can eat. Contact a dietitian for more information. What foods should I avoid? Fruits Fruits canned with  syrup. Vegetables Canned vegetables. Frozen vegetables with butter or cream sauce. Grains Refined white flour and flour products such as bread, pasta, snack foods, and cereals. Avoid all processed foods. Meats and other proteins Fatty cuts of meat. Poultry with skin. Breaded or fried meats. Processed meat. Avoid saturated fats. Dairy Full-fat yogurt, cheese, or milk. Beverages Sweetened drinks, such as soda or iced tea. The items listed above may not be a complete list of foods and beverages you should avoid. Contact a dietitian for more information. Questions to ask a health care provider  Do I need to meet with a diabetes educator?  Do I need to meet with a dietitian?  What number can I call if I have questions?  When are the best times to check my blood glucose? Where to find more information:  American Diabetes Association: diabetes.org  Academy of Nutrition and Dietetics: www.eatright.org  National Institute of Diabetes and Digestive and Kidney Diseases: www.niddk.nih.gov  Association of Diabetes Care and Education Specialists: www.diabeteseducator.org Summary  It is important to have healthy eating   habits because your blood sugar (glucose) levels are greatly affected by what you eat and drink.  A healthy meal plan will help you control your blood glucose and maintain a healthy lifestyle.  Your health care provider may recommend that you work with a dietitian to make a meal plan that is best for you.  Keep in mind that carbohydrates (carbs) and alcohol have immediate effects on your blood glucose levels. It is important to count carbs and to use alcohol carefully. This information is not intended to replace advice given to you by your health care provider. Make sure you discuss any questions you have with your health care provider. Document Revised: 06/10/2019 Document Reviewed: 06/10/2019 Elsevier Patient Education  2021 [place] Your  Hypertension Hypertension, also called high blood pressure, is when the force of the blood pressing against the walls of the arteries is too strong. Arteries are blood vessels that carry blood from your heart throughout your body. Hypertension forces the heart to work harder to pump blood and may cause the arteries to become narrow or stiff. Understanding blood pressure readings Your personal target blood pressure may vary depending on your medical conditions, your age, and other factors. A blood pressure reading includes a higher number over a lower number. Ideally, your blood pressure should be below 120/80. You should know that:  The first, or top, number is called the systolic pressure. It is a measure of the pressure in your arteries as your heart beats.  The second, or bottom number, is called the diastolic pressure. It is a measure of the pressure in your arteries as the heart relaxes. Blood pressure is classified into four stages. Based on your blood pressure reading, your health care provider may use the following stages to determine what type of treatment you need, if any. Systolic pressure and diastolic pressure are measured in a unit called mmHg. Normal  Systolic pressure: below 671.  Diastolic pressure: below 80. Elevated  Systolic pressure: 245-809.  Diastolic pressure: below 80. Hypertension stage 1  Systolic pressure: 983-382.  Diastolic pressure: 50-53. Hypertension stage 2  Systolic pressure: 976 or above.  Diastolic pressure: 90 or above. How can this condition affect me? Managing your hypertension is an important responsibility. Over time, hypertension can damage the arteries and decrease blood flow to important parts of the body, including the brain, heart, and kidneys. Having untreated or uncontrolled hypertension can lead to:  A heart attack.  A stroke.  A weakened blood vessel (aneurysm).  Heart failure.  Kidney damage.  Eye damage.  Metabolic  syndrome.  Memory and concentration problems.  Vascular dementia. What actions can I take to manage this condition? Hypertension can be managed by making lifestyle changes and possibly by taking medicines. Your health care provider will help you make a plan to bring your blood pressure within a normal range. Nutrition  Eat a diet that is high in fiber and potassium, and low in salt (sodium), added sugar, and fat. An example eating plan is called the Dietary Approaches to Stop Hypertension (DASH) diet. To eat this way: ? Eat plenty of fresh fruits and vegetables. Try to fill one-half of your plate at each meal with fruits and vegetables. ? Eat whole grains, such as whole-wheat pasta, brown rice, or whole-grain bread. Fill about one-fourth of your plate with whole grains. ? Eat low-fat dairy products. ? Avoid fatty cuts of meat, processed or cured meats, and poultry with skin. Fill about one-fourth of your  plate with lean proteins such as fish, chicken without skin, beans, eggs, and tofu. ? Avoid pre-made and processed foods. These tend to be higher in sodium, added sugar, and fat.  Reduce your daily sodium intake. Most people with hypertension should eat less than 1,500 mg of sodium a day.   Lifestyle  Work with your health care provider to maintain a healthy body weight or to lose weight. Ask what an ideal weight is for you.  Get at least 30 minutes of exercise that causes your heart to beat faster (aerobic exercise) most days of the week. Activities may include walking, swimming, or biking.  Include exercise to strengthen your muscles (resistance exercise), such as weight lifting, as part of your weekly exercise routine. Try to do these types of exercises for 30 minutes at least 3 days a week.  Do not use any products that contain nicotine or tobacco, such as cigarettes, e-cigarettes, and chewing tobacco. If you need help quitting, ask your health care provider.  Control any long-term  (chronic) conditions you have, such as high cholesterol or diabetes.  Identify your sources of stress and find ways to manage stress. This may include meditation, deep breathing, or making time for fun activities.   Alcohol use  Do not drink alcohol if: ? Your health care provider tells you not to drink. ? You are pregnant, may be pregnant, or are planning to become pregnant.  If you drink alcohol: ? Limit how much you use to:  0-1 drink a day for women.  0-2 drinks a day for men. ? Be aware of how much alcohol is in your drink. In the [place] one drink equals one 12 oz bottle of beer (355 mL), one 5 oz glass of wine (148 mL), or one 1 oz glass of hard liquor (44 mL). Medicines Your health care provider may prescribe medicine if lifestyle changes are not enough to get your blood pressure under control and if:  Your systolic blood pressure is 130 or higher.  Your diastolic blood pressure is 80 or higher. Take medicines only as told by your health care provider. Follow the directions carefully. Blood pressure medicines must be taken as told by your health care provider. The medicine does not work as well when you skip doses. Skipping doses also puts you at risk for problems. Monitoring Before you monitor your blood pressure:  Do not smoke, drink caffeinated beverages, or exercise within 30 minutes before taking a measurement.  Use the bathroom and empty your bladder (urinate).  Sit quietly for at least 5 minutes before taking measurements. Monitor your blood pressure at home as told by your health care provider. To do this:  Sit with your back straight and supported.  Place your feet flat on the floor. Do not cross your legs.  Support your arm on a flat surface, such as a table. Make sure your upper arm is at heart level.  Each time you measure, take two or three readings one minute apart and record the results. You may also need to have your blood pressure checked regularly by  your health care provider.   General information  Talk with your health care provider about your diet, exercise habits, and other lifestyle factors that may be contributing to hypertension.  Review all the medicines you take with your health care provider because there may be side effects or interactions.  Keep all visits as told by your health care provider. Your health care provider can help you  create and adjust your plan for managing your high blood pressure. Where to find more information  National Heart, Lung, and Blood Institute: https://wilson-eaton.com/  American Heart Association: www.heart.org Contact a health care provider if:  You think you are having a reaction to medicines you have taken.  You have repeated (recurrent) headaches.  You feel dizzy.  You have swelling in your ankles.  You have trouble with your vision. Get help right away if:  You develop a severe headache or confusion.  You have unusual weakness or numbness, or you feel faint.  You have severe pain in your chest or abdomen.  You vomit repeatedly.  You have trouble breathing. These symptoms may represent a serious problem that is an emergency. Do not wait to see if the symptoms will go away. Get medical help right away. Call your local emergency services (911 in the [place] Do not drive yourself to the hospital. Summary  Hypertension is when the force of blood pumping through your arteries is too strong. If this condition is not controlled, it may put you at risk for serious complications.  Your personal target blood pressure may vary depending on your medical conditions, your age, and other factors. For most people, a normal blood pressure is less than 120/80.  Hypertension is managed by lifestyle changes, medicines, or both.  Lifestyle changes to help manage hypertension include losing weight, eating a healthy, low-sodium diet, exercising more, stopping smoking, and limiting alcohol. This information  is not intended to replace advice given to you by your health care provider. Make sure you discuss any questions you have with your health care provider. Document Revised: 08/08/2019 Document Reviewed: 06/03/2019 Elsevier Patient Education  2021 [place].    Breast Cyst  A breast cyst is a sac in the breast that is filled with fluid. They are usually noncancerous (benign) and are common among women. Breast cysts are most often in the upper, outer portion of the breast. One or more cysts may develop. They form when fluid builds up inside the breast glands. There are several types of breast cysts. Some are too small to feel, but these can be seen with imaging tests such as an X-ray of the breast (mammogram) or ultrasound. Breast cysts do not increase your risk of breast cancer. They usually disappear after you no longer have a menstrual cycle (after menopause), unless you take artificial hormones (are on hormone therapy). What are the causes? This condition may be caused by:  Blockage of tubes (ducts) in the breast glands, which leads to fluid buildup. Duct blockage may result from: ? Fibrocystic breast changes. This is a common, benign condition that occurs when women go through hormonal changes during the menstrual cycle. This is a common cause of multiple breast cysts. ? Overgrowth of breast tissue or breast glands. ? Scar tissue in the breast from previous surgery.  Changes in certain female hormones (estrogen and progesterone). The exact cause of this condition is not known. What increases the risk? You may be more likely to develop breast cysts if you have not gone through menopause. What are the signs or symptoms? Symptoms of this condition include:  Feeling one or more smooth, round, soft lumps (like grapes) in the breast that are easily movable. The lump or lumps may get bigger and more painful before your menstrual period and get smaller after your menstrual period.  Breast  discomfort or pain. How is this diagnosed? This condition may be diagnosed based on:  A physical  exam. A cyst can be felt by your health care provider during this exam.  Imaging tests, such as mammogram or ultrasound. Fluid may be removed from the cyst with a needle (fine-needle aspiration) and tested to make sure the cyst is not cancerous. How is this treated? Treatment may not be needed for this condition. Your health care provider may monitor the cyst to see if it goes away on its own. If the cyst is uncomfortable or gets bigger, or if you do not like how the cyst makes your breast look, you may need treatment. Treatment may include:  Hormone therapy.  Fine-needle aspiration to drain fluid from the cyst. There is a chance of the cyst coming back (recurring) after aspiration.  Surgery to remove the cyst. Follow these instructions at home: Self-exams  Do a breast self-exam every month, or as often as directed. A breast self-exam involves: ? Comparing your breasts in the mirror. ? Looking for visible changes in your skin or nipples. ? Feeling for lumps or changes.  Having many breast cysts may make it harder to feel for new lumps. Understand how your breasts normally look and feel, and write down any changes in your breasts. Tell your health care provider about any changes.   Eating and drinking  Follow instructions from your health care provider about eating and drinking restrictions.  Drink enough fluid to keep your urine pale yellow.  Avoid caffeine.  Cut down on salt (sodium) in what you eat and drink, especially before your menstrual period. Too much sodium can cause fluid buildup, breast swelling, and discomfort. General instructions  See your health care provider regularly. ? Get a yearly physical exam. ? If you are 38-71 years old, get a clinical breast exam every 1-3 years. After the age of 6 years, get this exam every year. ? Get mammograms as often as  directed.  Take over-the-counter and prescription medicines only as told by your health care provider.  Wear a supportive bra, especially when exercising.  Keep all follow-up visits as told by your health care provider. This is important. Contact a health care provider if:  You feel, or think you feel, a lump in your breast.  You notice that both breasts look or feel different than usual.  Your breast is still causing pain after your menstrual period is over.  You find new lumps or bumps that were not there before.  You feel lumps in your armpit. Get help right away if:  You have severe pain, tenderness, redness, or warmth in your breast.  You have fluid or blood leaking from your nipple.  Your breast lump becomes hard and painful.  You notice dimpling or wrinkling of the breast or nipple. Summary  A breast cyst is a sac in the breast that is filled with fluid.  Treatment may not be needed for this condition.  If the cyst is uncomfortable or gets bigger, or if you do not like how the cyst makes your breast look, you may need treatment. This information is not intended to replace advice given to you by your health care provider. Make sure you discuss any questions you have with your health care provider. Document Revised: 11/19/2018 Document Reviewed: 11/19/2018 Elsevier Patient Education  [place].

## 2020-10-27 NOTE — Progress Notes (Signed)
Established Patient Office Visit  Subjective:  Patient ID: Hannah Huerta, female    DOB: Jan 20, 1961  Age: 60 y.o. MRN: [id number]  CC:  Chief Complaint  Patient presents with  . Follow-up    Follow up , this morning,  find a knot under her rt breast  it started out has a black head as gotten bigger     HPI Hannah Huerta presents for to office today for Hypertension and Type II Diabetes follow up. Patient states she is doing well with her weight loss journey. She reports monitoring diet more closely eliminating sugary snacks especially at night. The patient states that she takes medications daily. She has not been able to check BP because she cannot locate BP monitor. No access to glucometer. Today she complains of knot under right breast that started as a black head about 2 months ago. She states area has not been painful or tender but says she was able to express exudate x 1 occurence.She denies fever or chills. Patient has not had a mammogram. She reports having being diagnosed with a uterine fibroids but has not followed up with gyn services. She is scheduled for an eye exam in 1 month.   Past Medical History:  Diagnosis Date  . Abnormal Pap smear of cervix    pt couldnt state what type of abnormality  . Anemia   . Arthritis   . CAD in native artery 06/05/2015  . Carpal tunnel syndrome, bilateral   . Chest pain    a. 02/2012 abnl myoview - inferoapical reverisbility concerning for ischemia, EF 59%;   02/2012 nonobstructive cath  . CHF (congestive heart failure) ([place])   . Chronic migraine   . Diabetes mellitus without complication ([place])   . GERD (gastroesophageal reflux disease)   . HLD (hyperlipidemia)   . Hypertension   . Hypokalemia 06/05/2015  . Metabolic syndrome 97/41/6384  . Morbid obesity with BMI of 50.0-59.9, adult ([place])   . Prediabetes   . S/P cardiac catheterization, non obstructive disease 06/04/15 06/05/2015  . Sickle cell trait ([place])   . Spondylolisthesis of  lumbar region   . Syncope   . Wears glasses     Past Surgical History:  Procedure Laterality Date  . BACK SURGERY     x1 lumbar fusion  . CARDIAC CATHETERIZATION N/A 06/04/2015   Procedure: Left Heart Cath and Coronary Angiography;  Surgeon: [name], MD;  Location: [facility];  Service: Cardiovascular;  Laterality: N/A;  . CHOLECYSTECTOMY     laparoscopic  . COLONOSCOPY WITH PROPOFOL N/A 09/04/2014   Procedure: COLONOSCOPY WITH PROPOFOL;  Surgeon: [name], MD;  Location: [facility];  Service: Endoscopy;  Laterality: N/A;  . LEFT HEART CATHETERIZATION WITH CORONARY ANGIOGRAM N/A 03/08/2012   Procedure: LEFT HEART CATHETERIZATION WITH CORONARY ANGIOGRAM;  Surgeon: Hillary Bow, MD;  Location: [facility];  Service: Cardiovascular;  Laterality: N/A;  . SPINE SURGERY     fusion  . TUBAL LIGATION      Family History  Problem Relation Age of Onset  . Coronary artery disease Father 51  . Heart disease Father   . Other Father        died in a MVA  . Hypertension Father   . Sudden death Mother 20       Questionable MI  . Diabetes Mother   . Hypertension Mother   . Hypertension Sister   . Pancreatic cancer Maternal Aunt   . Breast  cancer Maternal Aunt        after age 44  . Stroke Paternal Grandmother   . Colon polyps Sister   . Colon cancer Neg Hx   . Gallbladder disease Neg Hx   . Esophageal cancer Neg Hx     Social History   Socioeconomic History  . Marital status: Widowed    Spouse name: Not on file  . Number of children: 3  . Years of education: Not on file  . Highest education level: Not on file  Occupational History  . 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 Smoking status: Never Smoker  . Smokeless tobacco: Never Used  Vaping Use  . Vaping Use: Never used  Substance and Sexual Activity  . Alcohol use: No  . Drug use: No  . Sexual activity: Yes    Birth control/protection: Surgical  Other Topics  Concern  . Not on file  Social History Narrative   Lives at home alone.   Social Determinants of Health   Financial Resource Strain: Not on file  Food Insecurity: Not on file  Transportation Needs: Not on file  Physical Activity: Not on file  Stress: Not on file  Social Connections: Not on file  Intimate Partner Violence: Not on file    Outpatient Medications Prior to Visit  Medication Sig Dispense Refill  . amLODipine (NORVASC) 5 MG tablet Take 1 tablet (5 mg total) by mouth daily. 90 tablet 3  . aspirin EC 81 MG tablet Take 1 tablet (81 mg total) by mouth daily. 30 tablet 11  . atorvastatin (LIPITOR) 40 MG tablet TAKE 1 TABLET(40 MG) BY MOUTH DAILY AT 6 PM 90 tablet 0  . carvedilol (COREG) 6.25 MG tablet Take 1 tablet (6.25 mg total) by mouth 2 (two) times daily with a meal. TAKE 1 TABLET BY MOUTH 2 TIMES DAILY WITH A MEAL. 180 tablet 0  . metFORMIN (GLUCOPHAGE) 500 MG tablet Take 1 tablet (500 mg total) by mouth 2 (two) times daily with a meal. 90 tablet 3  . spironolactone (ALDACTONE) 25 MG tablet Take 1 tablet (25 mg total) by mouth daily. 90 tablet 0  . cyclobenzaprine (FLEXERIL) 10 MG tablet Take 1 tablet (10 mg total) by mouth 3 (three) times daily as needed for muscle spasms. 60 tablet 0  . polyethylene glycol (MIRALAX) 17 g packet Take 17 g by mouth daily as needed for moderate constipation. (Patient not taking: Reported on 09/08/2019) 14 each 0   No facility-administered medications prior to visit.    No Known Allergies  ROS Review of Systems  Constitutional: Negative for chills, fatigue and fever.  HENT: Positive for postnasal drip (x 2 weeks). Negative for sore throat.   Respiratory: Negative for cough, chest tightness and shortness of breath.   Cardiovascular: Positive for leg swelling (LLE ankle edema, denies pain). Negative for chest pain and palpitations.  Gastrointestinal: Positive for nausea (d/t postnasal drip). Negative for abdominal pain, constipation,  diarrhea and vomiting.  Endocrine: Negative for cold intolerance, heat intolerance, polydipsia, polyphagia and polyuria.  Genitourinary: Negative for dysuria, frequency and urgency.  Musculoskeletal: Positive for back pain (lower back pain. spinal s/p July 2021).       LUE stiffness with ROM   Skin: Negative for rash.  Allergic/Immunologic: Positive for environmental allergies (seasonal allergies r/t pollen count). Negative for food allergies.  Neurological: Positive for numbness (Left foot toes intermittent ). Negative for syncope, weakness and headaches.  Hematological:  Does not bruise/bleed easily.  Psychiatric/Behavioral: Negative for self-injury and suicidal ideas. The patient is not nervous/anxious.       Objective:    Physical Exam Constitutional:      Appearance: Normal appearance. She is obese.  Cardiovascular:     Rate and Rhythm: Normal rate and regular rhythm.     Pulses: Normal pulses.     Heart sounds: Normal heart sounds. No murmur heard. No gallop.   Pulmonary:     Effort: Pulmonary effort is normal. No respiratory distress.     Breath sounds: Normal breath sounds. No wheezing or rhonchi.  Abdominal:     General: Bowel sounds are normal.     Palpations: Abdomen is soft.     Tenderness: There is no abdominal tenderness.  Musculoskeletal:        General: Tenderness (LUE, shoulder) present.  Skin:    General: Skin is warm and dry.  Neurological:     Mental Status: She is alert.     BP (!) 152/81 (BP Location: Left Arm, Patient Position: Sitting, Cuff Size: Normal)   Pulse 75   Temp (!) 97.5 F (36.4 C) (Temporal)   Ht 5\' 1"  (1.549 m)   Wt 271 lb (122.9 kg)   LMP 09/15/2013   SpO2 98%   BMI 51.21 kg/m  Wt Readings from Last 3 Encounters:  10/27/20 271 lb (122.9 kg)  03/10/20 277 lb (125.6 kg)  02/05/20 281 lb (127.5 kg)     Health Maintenance Due  Topic Date Due  . MAMMOGRAM  05/19/2016  . PAP SMEAR-Modifier  11/20/2016  . COVID-19 Vaccine (2 -  Booster for Janssen series) 12/29/2019    There are no preventive care reminders to display for this patient.  Lab Results  Component Value Date   TSH 1.570 09/08/2019   Lab Results  Component Value Date   WBC 9.5 02/06/2020   HGB 9.1 (L) 02/06/2020   HCT 29.3 (L) 02/06/2020   MCV 79.8 (L) 02/06/2020   PLT 231 02/06/2020   Lab Results  Component Value Date   NA 138 03/10/2020   K 4.3 03/10/2020   CO2 28 02/06/2020   GLUCOSE 116 (H) 03/10/2020   BUN 13 03/10/2020   CREATININE 0.94 03/10/2020   BILITOT 0.3 03/10/2020   ALKPHOS 136 (H) 03/10/2020   AST 23 03/10/2020   ALT 13 02/05/2019   PROT 7.8 03/10/2020   ALBUMIN 4.3 03/10/2020   CALCIUM 9.8 03/10/2020   ANIONGAP 10 02/06/2020   Lab Results  Component Value Date   CHOL 138 03/10/2020   Lab Results  Component Value Date   HDL 49 03/10/2020   Lab Results  Component Value Date   LDLCALC 74 03/10/2020   Lab Results  Component Value Date   TRIG 74 03/10/2020   Lab Results  Component Value Date   CHOLHDL 2.8 03/10/2020   Lab Results  Component Value Date   HGBA1C 5.8 (A) 10/27/2020   HGBA1C 5.8 10/27/2020   HGBA1C 5.8 10/27/2020   HGBA1C 5.8 10/27/2020      Assessment & Plan:  BP- continue with lifestyle modifications diet increase physical activity, walking F/U to assess BP- possible medication increase amlodipine 10 mg Mammogram/GYN visit Problem List Items Addressed This Visit   None   Visit Diagnoses    Type 2 diabetes mellitus without complication, without long-term current use of insulin (HCC)    -  Primary   Relevant Orders   HgB A1c (Completed)   POCT Urinalysis  Dipstick      No orders of the defined types were placed in this encounter.   Follow-up: Return in about 3 months (around 01/26/2021).    Hermina Staggers, RN

## 2020-10-27 NOTE — Progress Notes (Signed)
Hannah Huerta, Hannah Huerta  [id number] Phone:  [phone]   Fax:  [phone]   Established Patient Office Visit  Subjective:  Patient ID: Hannah Huerta, female    DOB: 1960-09-26  Age: 60 y.o. MRN: [id number]  CC:  Chief Complaint  Patient presents with  . Follow-up    Follow up , this morning,  find a knot under her rt breast  it started out has a black head as gotten bigger     HPI Hannah Huerta presents for follow-up.  She  has a past medical history of Abnormal Pap smear of cervix, Anemia, Arthritis, CAD in native artery (06/05/2015), Carpal tunnel syndrome, bilateral, Chest pain, CHF (congestive heart failure) ([place]), Chronic migraine, Diabetes mellitus without complication ([place]), GERD (gastroesophageal reflux disease), HLD (hyperlipidemia), Hypertension, Hypokalemia (62/22/9798), Metabolic syndrome (92/05/9416), Morbid obesity with BMI of 50.0-59.9, adult ([place]), Prediabetes, S/P cardiac catheterization, non obstructive disease 06/04/15 (06/05/2015), Sickle cell trait ([place]), Spondylolisthesis of lumbar region, Syncope, and Wears glasses.    Epidermal Cyst Patient complains of a subcutaneous nodule located over the breast.  This has been present for 2 months and then today she noticed a "knot".. There has been no associated symptoms of discharge, tenderness, or pain.  She admits that she has had drainage in the past. Patient does not have a history of epidermal inclusion cysts.   She is also following up for hypertension.  She is currently on amlodipine 5 mg.  She is on carvedilol 6.25 mg twice daily for coronary artery disease.  Spironolactone 25 mg daily for history of heart failure.  She does not monitor her blood pressure at home.  She denies any headaches, dizziness, shortness of breath or chest pains or swelling in legs feet or ankles..  She has diabetes with an A1c less than 7%.  She is currently on Metformin 500 mg twice daily.  She  tolerates this without difficulty.  She denies any home monitoring.  She has made lifestyle changes which has a 6 pound weight loss.  Past Medical History:  Diagnosis Date  . Abnormal Pap smear of cervix    pt couldnt state what type of abnormality  . Anemia   . Arthritis   . CAD in native artery 06/05/2015  . Carpal tunnel syndrome, bilateral   . Chest pain    a. 02/2012 abnl myoview - inferoapical reverisbility concerning for ischemia, EF 59%;   02/2012 nonobstructive cath  . CHF (congestive heart failure) ([place])   . Chronic migraine   . Diabetes mellitus without complication ([place])   . GERD (gastroesophageal reflux disease)   . HLD (hyperlipidemia)   . Hypertension   . Hypokalemia 06/05/2015  . Metabolic syndrome 40/81/4481  . Morbid obesity with BMI of 50.0-59.9, adult ([place])   . Prediabetes   . S/P cardiac catheterization, non obstructive disease 06/04/15 06/05/2015  . Sickle cell trait ([place])   . Spondylolisthesis of lumbar region   . Syncope   . Wears glasses     Past Surgical History:  Procedure Laterality Date  . BACK SURGERY     x1 lumbar fusion  . CARDIAC CATHETERIZATION N/A 06/04/2015   Procedure: Left Heart Cath and Coronary Angiography;  Surgeon: [name], MD;  Location: [facility];  Service: Cardiovascular;  Laterality: N/A;  . CHOLECYSTECTOMY     laparoscopic  . COLONOSCOPY WITH PROPOFOL N/A 09/04/2014   Procedure: COLONOSCOPY WITH PROPOFOL;  Surgeon: [name],  MD;  Location: [facility];  Service: Endoscopy;  Laterality: N/A;  . LEFT HEART CATHETERIZATION WITH CORONARY ANGIOGRAM N/A 03/08/2012   Procedure: LEFT HEART CATHETERIZATION WITH CORONARY ANGIOGRAM;  Surgeon: Hillary Bow, MD;  Location: [facility] C[facility] LAB;  Service: Cardiovascular;  Laterality: N/A;  . SPINE SURGERY     fusion  . TUBAL LIGATION      Family History  Problem Relation Age of Onset  . Coronary artery disease Father 85  . Heart disease Father   . Other Father         died in a MVA  . Hypertension Father   . Sudden death Mother 34       Questionable MI  . Diabetes Mother   . Hypertension Mother   . Hypertension Sister   . Pancreatic cancer Maternal Aunt   . Breast cancer Maternal Aunt        after age 48  . Stroke Paternal Grandmother   . Colon polyps Sister   . Colon cancer Neg Hx   . Gallbladder disease Neg Hx   . Esophageal cancer Neg Hx     Social History   Socioeconomic History  . Marital status: Widowed    Spouse name: Not on file  . Number of children: 3  . Years of education: Not on file  . Highest education level: Not on file  Occupational History  . 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 Smoking status: Never Smoker  . Smokeless tobacco: Never Used  Vaping Use  . Vaping Use: Never used  Substance and Sexual Activity  . Alcohol use: No  . Drug use: No  . Sexual activity: Yes    Birth control/protection: Surgical  Other Topics Concern  . Not on file  Social History Narrative   Lives at home alone.   Social Determinants of Health   Financial Resource Strain: Not on file  Food Insecurity: Not on file  Transportation Needs: Not on file  Physical Activity: Not on file  Stress: Not on file  Social Connections: Not on file  Intimate Partner Violence: Not on file    Outpatient Medications Prior to Visit  Medication Sig Dispense Refill  . amLODipine (NORVASC) 5 MG tablet Take 1 tablet (5 mg total) by mouth daily. 90 tablet 3  . aspirin EC 81 MG tablet Take 1 tablet (81 mg total) by mouth daily. 30 tablet 11  . atorvastatin (LIPITOR) 40 MG tablet TAKE 1 TABLET(40 MG) BY MOUTH DAILY AT 6 PM 90 tablet 0  . carvedilol (COREG) 6.25 MG tablet Take 1 tablet (6.25 mg total) by mouth 2 (two) times daily with a meal. TAKE 1 TABLET BY MOUTH 2 TIMES DAILY WITH A MEAL. 180 tablet 0  . metFORMIN (GLUCOPHAGE) 500 MG tablet Take 1 tablet (500 mg total) by mouth 2 (two) times daily with a meal. 90 tablet 3   . spironolactone (ALDACTONE) 25 MG tablet Take 1 tablet (25 mg total) by mouth daily. 90 tablet 0  . cyclobenzaprine (FLEXERIL) 10 MG tablet Take 1 tablet (10 mg total) by mouth 3 (three) times daily as needed for muscle spasms. 60 tablet 0  . polyethylene glycol (MIRALAX) 17 g packet Take 17 g by mouth daily as needed for moderate constipation. (Patient not taking: Reported on 09/08/2019) 14 each 0   No facility-administered medications prior to visit.    No Known Allergies  ROS Review of Systems    Objective:  Physical Exam Exam conducted with a chaperone present.  Constitutional:      Appearance: She is obese.  HENT:     Head: Normocephalic and atraumatic.  Cardiovascular:     Rate and Rhythm: Normal rate and regular rhythm.     Pulses: Normal pulses.     Heart sounds: Normal heart sounds.  Pulmonary:     Effort: Pulmonary effort is normal.     Breath sounds: Normal breath sounds.  Chest:     Chest wall: No mass, swelling or tenderness.  Breasts:     Right: Normal. No mass, nipple discharge, skin change, tenderness or axillary adenopathy.     Left: Normal. No mass, nipple discharge, skin change, tenderness or axillary adenopathy.    Musculoskeletal:        General: Normal range of motion.     Cervical back: Normal range of motion.  Lymphadenopathy:     Upper Body:     Right upper body: No axillary or pectoral adenopathy.     Left upper body: No axillary or pectoral adenopathy.  Skin:    General: Skin is warm and dry.     Capillary Refill: Capillary refill takes less than 2 seconds.  Neurological:     General: No focal deficit present.     Mental Status: She is alert and oriented to person, place, and time.  Psychiatric:        Mood and Affect: Mood normal.        Behavior: Behavior normal.        Thought Content: Thought content normal.        Judgment: Judgment normal.     BP (!) 152/81 (BP Location: Left Arm, Patient Position: Sitting, Cuff Size:  Normal)   Pulse 75   Temp (!) 97.5 F (36.4 C) (Temporal)   Ht 5\' 1"  (1.549 m)   Wt 271 lb (122.9 kg)   LMP 09/15/2013   SpO2 98%   BMI 51.21 kg/m  Wt Readings from Last 3 Encounters:  10/27/20 271 lb (122.9 kg)  03/10/20 277 lb (125.6 kg)  02/05/20 281 lb (127.5 kg)     Health Maintenance Due  Topic Date Due  . MAMMOGRAM  05/19/2016  . PAP SMEAR-Modifier  11/20/2016  . COVID-19 Vaccine (2 - Booster for Janssen series) 12/29/2019    There are no preventive care reminders to display for this patient.  Lab Results  Component Value Date   TSH 1.570 09/08/2019   Lab Results  Component Value Date   WBC 9.5 02/06/2020   HGB 9.1 (L) 02/06/2020   HCT 29.3 (L) 02/06/2020   MCV 79.8 (L) 02/06/2020   PLT 231 02/06/2020   Lab Results  Component Value Date   NA 138 03/10/2020   K 4.3 03/10/2020   CO2 28 02/06/2020   GLUCOSE 116 (H) 03/10/2020   BUN 13 03/10/2020   CREATININE 0.94 03/10/2020   BILITOT 0.3 03/10/2020   ALKPHOS 136 (H) 03/10/2020   AST 23 03/10/2020   ALT 13 02/05/2019   PROT 7.8 03/10/2020   ALBUMIN 4.3 03/10/2020   CALCIUM 9.8 03/10/2020   ANIONGAP 10 02/06/2020   Lab Results  Component Value Date   CHOL 138 03/10/2020   Lab Results  Component Value Date   HDL 49 03/10/2020   Lab Results  Component Value Date   LDLCALC 74 03/10/2020   Lab Results  Component Value Date   TRIG 74 03/10/2020   Lab Results  Component Value Date  CHOLHDL 2.8 03/10/2020   Lab Results  Component Value Date   HGBA1C 6.5 (H) 02/05/2020      Assessment & Plan:   Problem List Items Addressed This Visit   None   Visit Diagnoses    Type 2 diabetes mellitus without complication, without long-term current use of insulin ([place])    -  Primary   Relevant Orders   HgB A1c   POCT Urinalysis Dipstick      No orders of the defined types were placed in this encounter.   Follow-up: No follow-ups on file.    Vevelyn Francois, NP

## 2020-10-28 LAB — COMP. METABOLIC PANEL (12)
AST: 21 IU/L (ref 0–40)
Albumin/Globulin Ratio: 1.3 (ref 1.2–2.2)
Albumin: 4.4 g/dL (ref 3.8–4.9)
Alkaline Phosphatase: 111 IU/L (ref 44–121)
BUN/Creatinine Ratio: 21 (ref 9–23)
BUN: 18 mg/dL (ref 6–24)
Bilirubin Total: 0.3 mg/dL (ref 0.0–1.2)
Calcium: 10 mg/dL (ref 8.7–10.2)
Chloride: 101 mmol/L (ref 96–106)
Creatinine, Ser: 0.87 mg/dL (ref 0.57–1.00)
Globulin, Total: 3.4 g/dL (ref 1.5–4.5)
Glucose: 98 mg/dL (ref 65–99)
Potassium: 4.6 mmol/L (ref 3.5–5.2)
Sodium: 142 mmol/L (ref 134–144)
Total Protein: 7.8 g/dL (ref 6.0–8.5)
eGFR: 77 mL/min/{1.73_m2} (ref >59)

## 2020-10-28 LAB — CBC WITH DIFFERENTIAL/PLATELET
Basophils Absolute: 0 10*3/uL (ref 0.0–0.2)
Basos: 1 %
EOS (ABSOLUTE): 0.2 10*3/uL (ref 0.0–0.4)
Eos: 3 %
Hematocrit: 35.4 % (ref 34.0–46.6)
Hemoglobin: 11.3 g/dL (ref 11.1–15.9)
Immature Grans (Abs): 0 10*3/uL (ref 0.0–0.1)
Immature Granulocytes: 0 %
Lymphocytes Absolute: 1.1 10*3/uL (ref 0.7–3.1)
Lymphs: 26 %
MCH: 25.3 pg — ABNORMAL LOW (ref 26.6–33.0)
MCHC: 31.9 g/dL (ref 31.5–35.7)
MCV: 79 fL (ref 79–97)
Monocytes Absolute: 0.5 10*3/uL (ref 0.1–0.9)
Monocytes: 11 %
Neutrophils Absolute: 2.6 10*3/uL (ref 1.4–7.0)
Neutrophils: 59 %
Platelets: 305 10*3/uL (ref 150–450)
RBC: 4.47 x10E6/uL (ref 3.77–5.28)
RDW: 16.2 % — ABNORMAL HIGH (ref 11.7–15.4)
WBC: 4.4 10*3/uL (ref 3.4–10.8)

## 2020-12-10 ENCOUNTER — Telehealth: Payer: Self-pay

## 2020-12-10 NOTE — Telephone Encounter (Signed)
Med refill Mettformin

## 2020-12-15 NOTE — Telephone Encounter (Signed)
Patient had refills on file. Patient aware and was able to get refills.

## 2020-12-25 ENCOUNTER — Ambulatory Visit
Admission: RE | Admit: 2020-12-25 | Discharge: 2020-12-25 | Disposition: A | Discharge status: Home / Self Care | Payer: Medicare Other | Source: Ambulatory Visit | Attending: Nurse Practitioner | Admitting: Nurse Practitioner

## 2020-12-25 ENCOUNTER — Other Ambulatory Visit: Payer: Self-pay

## 2020-12-25 DIAGNOSIS — Z1231 Encounter for screening mammogram for malignant neoplasm of breast: Secondary | ICD-10-CM

## 2020-12-28 ENCOUNTER — Other Ambulatory Visit: Payer: Self-pay | Admitting: Nurse Practitioner

## 2020-12-28 DIAGNOSIS — R928 Other abnormal and inconclusive findings on diagnostic imaging of breast: Secondary | ICD-10-CM

## 2021-01-04 DIAGNOSIS — I10 Essential (primary) hypertension: Secondary | ICD-10-CM | POA: Diagnosis not present

## 2021-01-04 DIAGNOSIS — M48062 Spinal stenosis, lumbar region with neurogenic claudication: Secondary | ICD-10-CM | POA: Diagnosis not present

## 2021-01-04 DIAGNOSIS — M21379 Foot drop, unspecified foot: Secondary | ICD-10-CM | POA: Insufficient documentation

## 2021-01-04 DIAGNOSIS — M4316 Spondylolisthesis, lumbar region: Secondary | ICD-10-CM | POA: Diagnosis not present

## 2021-01-04 DIAGNOSIS — M21371 Foot drop, right foot: Secondary | ICD-10-CM | POA: Diagnosis not present

## 2021-01-21 ENCOUNTER — Ambulatory Visit
Admission: RE | Admit: 2021-01-21 | Discharge: 2021-01-21 | Disposition: A | Discharge status: Home / Self Care | Payer: Medicare Other | Source: Ambulatory Visit | Attending: Nurse Practitioner | Admitting: Nurse Practitioner

## 2021-01-21 ENCOUNTER — Other Ambulatory Visit: Payer: Self-pay

## 2021-01-21 ENCOUNTER — Other Ambulatory Visit: Payer: Self-pay | Admitting: Nurse Practitioner

## 2021-01-21 DIAGNOSIS — R928 Other abnormal and inconclusive findings on diagnostic imaging of breast: Secondary | ICD-10-CM | POA: Diagnosis not present

## 2021-01-27 ENCOUNTER — Ambulatory Visit: Payer: Medicare Other | Admitting: Nurse Practitioner

## 2021-03-14 ENCOUNTER — Other Ambulatory Visit: Payer: Self-pay

## 2021-03-14 DIAGNOSIS — I10 Essential (primary) hypertension: Secondary | ICD-10-CM

## 2021-03-14 MED ORDER — AMLODIPINE BESYLATE 5 MG PO TABS: 5.0000 mg | ORAL_TABLET | Freq: Every day | ORAL | 3 refills | Status: DC

## 2021-03-24 ENCOUNTER — Encounter: Payer: Self-pay | Admitting: Nurse Practitioner

## 2021-03-24 ENCOUNTER — Other Ambulatory Visit: Payer: Self-pay

## 2021-03-24 ENCOUNTER — Ambulatory Visit (INDEPENDENT_AMBULATORY_CARE_PROVIDER_SITE_OTHER): Payer: Medicare Other | Admitting: Nurse Practitioner

## 2021-03-24 VITALS — BP 154/82 | HR 68 | Temp 97.3°F | Ht 61.0 in | Wt 272.0 lb

## 2021-03-24 DIAGNOSIS — D219 Benign neoplasm of connective and other soft tissue, unspecified: Secondary | ICD-10-CM

## 2021-03-24 DIAGNOSIS — D259 Leiomyoma of uterus, unspecified: Secondary | ICD-10-CM

## 2021-03-24 DIAGNOSIS — E7849 Other hyperlipidemia: Secondary | ICD-10-CM

## 2021-03-24 DIAGNOSIS — E119 Type 2 diabetes mellitus without complications: Secondary | ICD-10-CM

## 2021-03-24 DIAGNOSIS — I10 Essential (primary) hypertension: Secondary | ICD-10-CM

## 2021-03-24 LAB — POCT GLYCOSYLATED HEMOGLOBIN (HGB A1C): Hemoglobin A1C: 5.8 % — AB (ref 4.0–5.6)

## 2021-03-24 LAB — POCT URINALYSIS DIP (CLINITEK)
Bilirubin, UA: NEGATIVE
Glucose, UA: NEGATIVE mg/dL
Ketones, POC UA: NEGATIVE mg/dL
Leukocytes, UA: NEGATIVE
Nitrite, UA: NEGATIVE
POC PROTEIN,UA: 100 — AB
Spec Grav, UA: 1.02 (ref 1.010–1.025)
Urobilinogen, UA: 0.2 E.U./dL
pH, UA: 5.5 (ref 5.0–8.0)

## 2021-03-24 NOTE — Patient Instructions (Signed)
Diabetes Mellitus and Nutrition, Adult When you have diabetes, or diabetes mellitus, it is very important to have healthy eating habits because your blood sugar (glucose) levels are greatly affected by what you eat and drink. Eating healthy foods in the right amounts, at about the same times every day, can help you:  Control your blood glucose.  Lower your risk of heart disease.  Improve your blood pressure.  Reach or maintain a healthy weight. What can affect my meal plan? Every person with diabetes is different, and each person has different needs for a meal plan. Your health care provider may recommend that you work with a dietitian to make a meal plan that is best for you. Your meal plan may vary depending on factors such as:  The calories you need.  The medicines you take.  Your weight.  Your blood glucose, blood pressure, and cholesterol levels.  Your activity level.  Other health conditions you have, such as heart or kidney disease. How do carbohydrates affect me? Carbohydrates, also called carbs, affect your blood glucose level more than any other type of food. Eating carbs naturally raises the amount of glucose in your blood. Carb counting is a method for keeping track of how many carbs you eat. Counting carbs is important to keep your blood glucose at a healthy level, especially if you use insulin or take certain oral diabetes medicines. It is important to know how many carbs you can safely have in each meal. This is different for every person. Your dietitian can help you calculate how many carbs you should have at each meal and for each snack. How does alcohol affect me? Alcohol can cause a sudden decrease in blood glucose (hypoglycemia), especially if you use insulin or take certain oral diabetes medicines. Hypoglycemia can be a life-threatening condition. Symptoms of hypoglycemia, such as sleepiness, dizziness, and confusion, are similar to symptoms of having too much  alcohol.  Do not drink alcohol if: ? Your health care provider tells you not to drink. ? You are pregnant, may be pregnant, or are planning to become pregnant.  If you drink alcohol: ? Do not drink on an empty stomach. ? Limit how much you use to:  0-1 drink a day for women.  0-2 drinks a day for men. ? Be aware of how much alcohol is in your drink. In the [place] one drink equals one 12 oz bottle of beer (355 mL), one 5 oz glass of wine (148 mL), or one 1 oz glass of hard liquor (44 mL). ? Keep yourself hydrated with water, diet soda, or unsweetened iced tea.  Keep in mind that regular soda, juice, and other mixers may contain a lot of sugar and must be counted as carbs. What are tips for following this plan? Reading food labels  Start by checking the serving size on the "Nutrition Facts" label of packaged foods and drinks. The amount of calories, carbs, fats, and other nutrients listed on the label is based on one serving of the item. Many items contain more than one serving per package.  Check the total grams (g) of carbs in one serving. You can calculate the number of servings of carbs in one serving by dividing the total carbs by 15. For example, if a food has 30 g of total carbs per serving, it would be equal to 2 servings of carbs.  Check the number of grams (g) of saturated fats and trans fats in one serving. Choose foods that have   a low amount or none of these fats.  Check the number of milligrams (mg) of salt (sodium) in one serving. Most people should limit total sodium intake to less than 2,300 mg per day.  Always check the nutrition information of foods labeled as "low-fat" or "nonfat." These foods may be higher in added sugar or refined carbs and should be avoided.  Talk to your dietitian to identify your daily goals for nutrients listed on the label. Shopping  Avoid buying canned, pre-made, or processed foods. These foods tend to be high in fat, sodium, and added  sugar.  Shop around the outside edge of the grocery store. This is where you will most often find fresh fruits and vegetables, bulk grains, fresh meats, and fresh dairy. Cooking  Use low-heat cooking methods, such as baking, instead of high-heat cooking methods like deep frying.  Cook using healthy oils, such as olive, canola, or sunflower oil.  Avoid cooking with butter, cream, or high-fat meats. Meal planning  Eat meals and snacks regularly, preferably at the same times every day. Avoid going long periods of time without eating.  Eat foods that are high in fiber, such as fresh fruits, vegetables, beans, and whole grains. Talk with your dietitian about how many servings of carbs you can eat at each meal.  Eat 4-6 oz (112-168 g) of lean protein each day, such as lean meat, chicken, fish, eggs, or tofu. One ounce (oz) of lean protein is equal to: ? 1 oz (28 g) of meat, chicken, or fish. ? 1 egg. ?  cup (62 g) of tofu.  Eat some foods each day that contain healthy fats, such as avocado, nuts, seeds, and fish.   What foods should I eat? Fruits Berries. Apples. Oranges. Peaches. Apricots. Plums. Grapes. Mango. Papaya. Pomegranate. Kiwi. Cherries. Vegetables Lettuce. Spinach. Leafy greens, including kale, chard, collard greens, and mustard greens. Beets. Cauliflower. Cabbage. Broccoli. Carrots. Green beans. Tomatoes. Peppers. Onions. Cucumbers. Brussels sprouts. Grains Whole grains, such as whole-wheat or whole-grain bread, crackers, tortillas, cereal, and pasta. Unsweetened oatmeal. Quinoa. Brown or wild rice. Meats and other proteins Seafood. Poultry without skin. Lean cuts of poultry and beef. Tofu. Nuts. Seeds. Dairy Low-fat or fat-free dairy products such as milk, yogurt, and cheese. The items listed above may not be a complete list of foods and beverages you can eat. Contact a dietitian for more information. What foods should I avoid? Fruits Fruits canned with  syrup. Vegetables Canned vegetables. Frozen vegetables with butter or cream sauce. Grains Refined white flour and flour products such as bread, pasta, snack foods, and cereals. Avoid all processed foods. Meats and other proteins Fatty cuts of meat. Poultry with skin. Breaded or fried meats. Processed meat. Avoid saturated fats. Dairy Full-fat yogurt, cheese, or milk. Beverages Sweetened drinks, such as soda or iced tea. The items listed above may not be a complete list of foods and beverages you should avoid. Contact a dietitian for more information. Questions to ask a health care provider  Do I need to meet with a diabetes educator?  Do I need to meet with a dietitian?  What number can I call if I have questions?  When are the best times to check my blood glucose? Where to find more information:  American Diabetes Association: diabetes.org  Academy of Nutrition and Dietetics: www.eatright.org  National Institute of Diabetes and Digestive and Kidney Diseases: www.niddk.nih.gov  Association of Diabetes Care and Education Specialists: www.diabeteseducator.org Summary  It is important to have healthy eating   habits because your blood sugar (glucose) levels are greatly affected by what you eat and drink.  A healthy meal plan will help you control your blood glucose and maintain a healthy lifestyle.  Your health care provider may recommend that you work with a dietitian to make a meal plan that is best for you.  Keep in mind that carbohydrates (carbs) and alcohol have immediate effects on your blood glucose levels. It is important to count carbs and to use alcohol carefully. This information is not intended to replace advice given to you by your health care provider. Make sure you discuss any questions you have with your health care provider. Document Revised: 06/10/2019 Document Reviewed: 06/10/2019 Elsevier Patient Education  2021 Elsevier Inc.  

## 2021-03-24 NOTE — Progress Notes (Signed)
[place], [place]  [address] Phone:  [phone]   Fax:  [phone]    Established Patient Office Visit  Subjective:  Patient ID: Hannah Huerta, female    DOB: June 15, 1961  Age: 60 y.o. MRN: [id number]  CC:  Chief Complaint  Patient presents with   Follow-up    6 week follow up    HPI Hannah Huerta presents for follow up. She  has a past medical history of Abnormal Pap smear of cervix, Anemia, Arthritis, CAD in native artery (06/05/2015), Carpal tunnel syndrome, bilateral, Chest pain, CHF (congestive heart failure) ([place]), Chronic migraine, Diabetes mellitus without complication ([place]), GERD (gastroesophageal reflux disease), HLD (hyperlipidemia), Hypertension, Hypokalemia (33/83/2919), Metabolic syndrome (16/60/6004), Morbid obesity with BMI of 50.0-59.9, adult ([place]), Prediabetes, S/P cardiac catheterization, non obstructive disease 06/04/15 (06/05/2015), Sickle cell trait ([place]), Spondylolisthesis of lumbar region, Syncope, and Wears glasses.   She reports that August and September are difficult months for her due to this being he anniversary of her husband and mothers deaths. She was able to celebrate her birthday that she shares with her daughter on last week.   She reports that she continues to work on her diet.  She is also exercising. She has sustained a 5 pound weight loss over the last year. She has not being monitoring her BP at home due to her monitor issues. She reports being under a lot of stress with her son living in the home with her. She admits that it was comforting at first when her husband passed however she is ready to be independent again. She reports that she did not realize that this could impact her BP.   [facility].  She reports a history of fibroids.  Past Medical History:  Diagnosis Date   Abnormal Pap smear of cervix    pt couldnt state what type  of abnormality   Anemia    Arthritis    CAD in native artery 06/05/2015   Carpal tunnel syndrome, bilateral    Chest pain    a. 02/2012 abnl myoview - inferoapical reverisbility concerning for ischemia, EF 59%;   02/2012 nonobstructive cath   CHF (congestive heart failure) ([facility])    Chronic migraine    Diabetes mellitus without complication ([facility])    GERD (gastroesophageal reflux disease)    HLD (hyperlipidemia)    Hypertension    Hypokalemia 59/97/7414   Metabolic syndrome 23/95/3202   Morbid obesity with BMI of 50.0-59.9, adult ([place])    Prediabetes    S/P cardiac catheterization, non obstructive disease 06/04/15 06/05/2015   Sickle cell trait ([facility])    Spondylolisthesis of lumbar region    Syncope    Wears glasses     Past Surgical History:  Procedure Laterality Date   BACK SURGERY     x1 lumbar fusion   CARDIAC CATHETERIZATION N/A 06/04/2015   Procedure: Left Heart Cath and Coronary Angiography;  Surgeon: [name], MD;  Location: [facility];  Service: Cardiovascular;  Laterality: N/A;   CHOLECYSTECTOMY     laparoscopic   COLONOSCOPY WITH PROPOFOL N/A 09/04/2014   Procedure: COLONOSCOPY WITH PROPOFOL;  Surgeon: [name], MD;  Location: [facility];  Service: Endoscopy;  Laterality: N/A;   LEFT HEART CATHETERIZATION WITH CORONARY ANGIOGRAM N/A 03/08/2012   Procedure: LEFT HEART CATHETERIZATION WITH CORONARY ANGIOGRAM;  Surgeon: Hillary Bow, MD;  Location:  [facility];  Service: Cardiovascular;  Laterality: N/A;   SPINE SURGERY     fusion   TUBAL LIGATION      Family History  Problem Relation Age of Onset   Coronary artery disease Father 50   Heart disease Father    Other Father        died in a MVA   Hypertension Father    Sudden death Mother 98       Questionable MI   Diabetes Mother    Hypertension Mother    Hypertension Sister    Pancreatic cancer Maternal Aunt    Breast cancer Maternal Aunt        after age 58   Stroke Paternal  Grandmother    Colon polyps Sister    Colon cancer Neg Hx    Gallbladder disease Neg Hx    Esophageal cancer Neg Hx     Social History   Socioeconomic History   Marital status: Widowed    Spouse name: Not on file   Number of children: 3   Years of education: Not on file   Highest education level: Not on file  Occupational History   Occupation: Works in the Estate manager/land agent: [employer] Use   Smoking status: Never   Smokeless tobacco: Never  Vaping Use   Vaping Use: Never used  Substance and Sexual Activity   Alcohol use: No   Drug use: No   Sexual activity: Yes    Birth control/protection: Surgical  Other Topics Concern   Not on file  Social History Narrative   Lives at home alone.   Social Determinants of Health   Financial Resource Strain: Not on file  Food Insecurity: Not on file  Transportation Needs: Not on file  Physical Activity: Not on file  Stress: Not on file  Social Connections: Not on file  Intimate Partner Violence: Not on file    Outpatient Medications Prior to Visit  Medication Sig Dispense Refill   amLODipine (NORVASC) 5 MG tablet Take 1 tablet (5 mg total) by mouth daily. 90 tablet 3   aspirin EC 81 MG tablet Take 1 tablet (81 mg total) by mouth daily. 30 tablet 11   atorvastatin (LIPITOR) 40 MG tablet Take 1 tablet (40 mg total) by mouth daily. 90 tablet 0   carvedilol (COREG) 6.25 MG tablet Take 1 tablet (6.25 mg total) by mouth 2 (two) times daily with a meal. TAKE 1 TABLET BY MOUTH 2 TIMES DAILY WITH A MEAL. 180 tablet 3   metFORMIN (GLUCOPHAGE) 500 MG tablet Take 1 tablet (500 mg total) by mouth 2 (two) times daily with a meal. 90 tablet 3   spironolactone (ALDACTONE) 25 MG tablet Take 1 tablet (25 mg total) by mouth daily. 90 tablet 3   No facility-administered medications prior to visit.    No Known Allergies  ROS Review of Systems    Objective:    Physical Exam Constitutional:      Appearance: She is obese.  HENT:      Head: Normocephalic and atraumatic.     Nose: Nose normal.     Mouth/Throat:     Mouth: Mucous membranes are moist.  Cardiovascular:     Rate and Rhythm: Normal rate and regular rhythm.     Pulses: Normal pulses.     Heart sounds: Normal heart sounds. No murmur heard. Pulmonary:     Effort: Pulmonary effort is normal.     Breath sounds: Normal  breath sounds.  Abdominal:     Palpations: Abdomen is soft.  Musculoskeletal:        General: Normal range of motion.     Cervical back: Normal range of motion.     Right lower leg: No edema.     Left lower leg: No edema.  Skin:    General: Skin is warm and dry.     Capillary Refill: Capillary refill takes less than 2 seconds.  Neurological:     General: No focal deficit present.     Mental Status: She is alert and oriented to person, place, and time.  Psychiatric:        Mood and Affect: Mood normal.        Behavior: Behavior normal.        Thought Content: Thought content normal.        Judgment: Judgment normal.    BP (!) 154/82   Pulse 68   Temp (!) 97.3 F (36.3 C)   Ht _0  (1.549 m)   Wt 272 lb (123.4 kg)   LMP 09/15/2013   SpO2 97%   BMI 51.39 kg/m  Wt Readings from Last 3 Encounters:  03/24/21 272 lb (123.4 kg)  10/27/20 271 lb (122.9 kg)  03/10/20 277 lb (125.6 kg)     Health Maintenance Due  Topic Date Due   Zoster Vaccines- Shingrix (1 of 2) Never done   PAP SMEAR-Modifier  11/20/2016   Pneumococcal Vaccine 58-37 Years old (3 - PCV) 03/01/2018   COVID-19 Vaccine (2 - [facility] risk series) 12/01/2019   URINE MICROALBUMIN  03/10/2021    There are no preventive care reminders to display for this patient.  Lab Results  Component Value Date   TSH 1.570 09/08/2019   Lab Results  Component Value Date   WBC 4.4 10/27/2020   HGB 11.3 10/27/2020   HCT 35.4 10/27/2020   MCV 79 10/27/2020   PLT 305 10/27/2020   Lab Results  Component Value Date   NA 142 10/27/2020   K 4.6 10/27/2020   CO2 28 02/06/2020    GLUCOSE 98 10/27/2020   BUN 18 10/27/2020   CREATININE 0.87 10/27/2020   BILITOT 0.3 10/27/2020   ALKPHOS 111 10/27/2020   AST 21 10/27/2020   ALT 13 02/05/2019   PROT 7.8 10/27/2020   ALBUMIN 4.4 10/27/2020   CALCIUM 10.0 10/27/2020   ANIONGAP 10 02/06/2020   EGFR 77 10/27/2020   Lab Results  Component Value Date   CHOL 138 03/10/2020   Lab Results  Component Value Date   HDL 49 03/10/2020   Lab Results  Component Value Date   LDLCALC 74 03/10/2020   Lab Results  Component Value Date   TRIG 74 03/10/2020   Lab Results  Component Value Date   CHOLHDL 2.8 03/10/2020   Lab Results  Component Value Date   HGBA1C 5.8 (A) 03/24/2021      Assessment & Plan:   Problem List Items Addressed This Visit       Other   Hyperlipidemia   Other Visit Diagnoses     Type 2 diabetes mellitus without complication, without long-term current use of insulin ([place])    -  Primary Controlled Encourage compliance with current treatment regimen  Continue lifestyle modification with healthy diet (fewer calories, more high fiber foods, whole grains and non-starchy vegetables, lower fat meat and fish, low-fat diary include healthy oils) regular exercise (physical activity) and weight loss    Relevant Orders  HgB A1c (Completed)   POCT URINALYSIS DIP (CLINITEK) (Completed)   Microalbumin, urine   Essential hypertension     Encouraged on going compliance with current medication regimen Encouraged home monitoring and recording BP <130/80 Eating a heart-healthy diet with less salt Encouraged regular physical activity  Recommend Weight loss   Relevant Orders   POCT URINALYSIS DIP (CLINITEK) (Completed)   Fibroids     Evaluation of   Relevant Orders   US Pelvis Complete   Uterine leiomyoma, unspecified location           No orders of the defined types were placed in this encounter.   Follow-up: Return in about 6 weeks (around 05/05/2021) for PAP next visit .    Vevelyn Francois, NP

## 2021-03-25 LAB — MICROALBUMIN, URINE: Microalbumin, Urine: 354 ug/mL

## 2021-03-26 ENCOUNTER — Encounter: Payer: Self-pay | Admitting: Nurse Practitioner

## 2021-03-28 ENCOUNTER — Encounter: Payer: Self-pay | Admitting: Nurse Practitioner

## 2021-03-28 DIAGNOSIS — IMO0002 Reserved for concepts with insufficient information to code with codable children: Secondary | ICD-10-CM | POA: Insufficient documentation

## 2021-03-28 DIAGNOSIS — E1129 Type 2 diabetes mellitus with other diabetic kidney complication: Secondary | ICD-10-CM | POA: Insufficient documentation

## 2021-03-29 ENCOUNTER — Ambulatory Visit (HOSPITAL_COMMUNITY)
Admission: RE | Admit: 2021-03-29 | Discharge: 2021-03-29 | Disposition: A | Discharge status: Home / Self Care | Payer: Medicare Other | Source: Ambulatory Visit | Attending: Nurse Practitioner | Admitting: Nurse Practitioner

## 2021-03-29 ENCOUNTER — Other Ambulatory Visit: Payer: Self-pay

## 2021-03-29 DIAGNOSIS — D219 Benign neoplasm of connective and other soft tissue, unspecified: Secondary | ICD-10-CM | POA: Insufficient documentation

## 2021-04-06 ENCOUNTER — Ambulatory Visit: Payer: Medicare Other

## 2021-04-06 NOTE — Progress Notes (Deleted)
Subjective:  I connected with  Hannah Huerta on 04/06/21 by an audio only telemedicine application and verified that I am speaking with the correct person using two identifiers.   I discussed the limitations, risks, security and privacy concerns of performing an evaluation and management service by telephone and the availability of in person appointments. I also discussed with the patient that there may be a patient responsible charge related to this service. The patient expressed understanding and verbally consented to this telephonic visit.  Location of Patient: Home Location of Provider: Office  List any persons and their role that are participating in the visit with the patient. None   Review of Systems    Defer to PCP       Objective:    There were no vitals filed for this visit. There is no height or weight on file to calculate BMI.  Advanced Directives 01/27/2020 11/27/2019 10/16/2019 06/03/2018 03/07/2017 02/28/2017 02/27/2017  Does Patient Have a Medical Advance Directive? No No No No No No No  Does patient want to make changes to medical advance directive? - - - - - - -  Would patient like information on creating a medical advance directive? No - Patient declined No - Patient declined No - Patient declined No - Patient declined - Yes (Inpatient - patient requests chaplain consult to create a medical advance directive) -  Pre-existing out of facility DNR order (yellow form or pink MOST form) - - - - - - -    Current Medications (verified) Outpatient Encounter Medications as of 04/06/2021  Medication Sig   amLODipine (NORVASC) 5 MG tablet Take 1 tablet (5 mg total) by mouth daily.   aspirin EC 81 MG tablet Take 1 tablet (81 mg total) by mouth daily.   atorvastatin (LIPITOR) 40 MG tablet Take 1 tablet (40 mg total) by mouth daily.   carvedilol (COREG) 6.25 MG tablet Take 1 tablet (6.25 mg total) by mouth 2 (two) times daily with a meal. TAKE 1 TABLET BY MOUTH 2 TIMES DAILY WITH A  MEAL.   metFORMIN (GLUCOPHAGE) 500 MG tablet Take 1 tablet (500 mg total) by mouth 2 (two) times daily with a meal.   spironolactone (ALDACTONE) 25 MG tablet Take 1 tablet (25 mg total) by mouth daily.   No facility-administered encounter medications on file as of 04/06/2021.    Allergies (verified) Patient has no known allergies.   History: Past Medical History:  Diagnosis Date   Abnormal Pap smear of cervix    pt couldnt state what type of abnormality   Anemia    Arthritis    CAD in native artery 06/05/2015   Carpal tunnel syndrome, bilateral    Chest pain    a. 02/2012 abnl myoview - inferoapical reverisbility concerning for ischemia, EF 59%;   02/2012 nonobstructive cath   CHF (congestive heart failure) (HCC)    Chronic migraine    Diabetes mellitus without complication (HCC)    GERD (gastroesophageal reflux disease)    HLD (hyperlipidemia)    Hypertension    Hypokalemia 82/99/3716   Metabolic syndrome 96/78/9381   Morbid obesity with BMI of 50.0-59.9, adult ([place])    Prediabetes    S/P cardiac catheterization, non obstructive disease 06/04/15 06/05/2015   Sickle cell trait (HCC)    Spondylolisthesis of lumbar region    Syncope    Wears glasses    Past Surgical History:  Procedure Laterality Date   BACK SURGERY     x1 lumbar fusion  CARDIAC CATHETERIZATION N/A 06/04/2015   Procedure: Left Heart Cath and Coronary Angiography;  Surgeon: [name], MD;  Location: [facility];  Service: Cardiovascular;  Laterality: N/A;   CHOLECYSTECTOMY     laparoscopic   COLONOSCOPY WITH PROPOFOL N/A 09/04/2014   Procedure: COLONOSCOPY WITH PROPOFOL;  Surgeon: [name], MD;  Location: [facility];  Service: Endoscopy;  Laterality: N/A;   LEFT HEART CATHETERIZATION WITH CORONARY ANGIOGRAM N/A 03/08/2012   Procedure: LEFT HEART CATHETERIZATION WITH CORONARY ANGIOGRAM;  Surgeon: Hillary Bow, MD;  Location: [facility];  Service: Cardiovascular;  Laterality: N/A;    SPINE SURGERY     fusion   TUBAL LIGATION     Family History  Problem Relation Age of Onset   Coronary artery disease Father 13   Heart disease Father    Other Father        died in a MVA   Hypertension Father    Sudden death Mother 4       Questionable MI   Diabetes Mother    Hypertension Mother    Hypertension Sister    Pancreatic cancer Maternal Aunt    Breast cancer Maternal Aunt        after age 40   Stroke Paternal Grandmother    Colon polyps Sister    Colon cancer Neg Hx    Gallbladder disease Neg Hx    Esophageal cancer Neg Hx    Social History   Socioeconomic History   Marital status: Widowed    Spouse name: Not on file   Number of children: 3   Years of education: Not on file   Highest education level: Not on file  Occupational History   Occupation: Works in the Estate manager/land agent: [employer] Use   Smoking status: Never   Smokeless tobacco: Never  Vaping Use   Vaping Use: Never used  Substance and Sexual Activity   Alcohol use: No   Drug use: No   Sexual activity: Yes    Birth control/protection: Surgical  Other Topics Concern   Not on file  Social History Narrative   Lives at home alone.   Social Determinants of Health   Financial Resource Strain: Not on file  Food Insecurity: Not on file  Transportation Needs: Not on file  Physical Activity: Not on file  Stress: Not on file  Social Connections: Not on file    Tobacco Counseling Counseling given: Not Answered   Clinical Intake:                 Diabetic?***         Activities of Daily Living No flowsheet data found.  Patient Care Team: Vevelyn Francois, NP as PCP - General (Adult Health Nurse Practitioner)  Indicate any recent Medical Services you may have received from other than [facility] providers in the past year (date may be approximate).     Assessment:   This is a routine wellness examination for Hannah Huerta.  Hearing/Vision screen No results found.  Dietary  issues and exercise activities discussed:     Goals Addressed   None    Depression Screen PHQ 2/9 Scores 03/24/2021 09/08/2019 09/12/2018 06/12/2018 01/31/2018 01/30/2018 08/01/2017  PHQ - 2 Score 1 0 1 0 0 1 0  PHQ- 9 Score - - - - 0 - -    Fall Risk Fall Risk  03/24/2021 09/08/2019 09/12/2018 06/12/2018 01/30/2018  Falls in the past year? 0 0 0 0  No  Number falls in past yr: 0 - - - -  Injury with Fall? 0 - - - -    FALL RISK PREVENTION PERTAINING TO THE HOME:  Any stairs in or around the home? {YES/NO:21197} If so, are there any without handrails? {YES/NO:21197} Home free of loose throw rugs in walkways, pet beds, electrical cords, etc? {YES/NO:21197} Adequate lighting in your home to reduce risk of falls? {YES/NO:21197}  ASSISTIVE DEVICES UTILIZED TO PREVENT FALLS:  Life alert? {YES/NO:21197} Use of a cane, walker or w/c? {YES/NO:21197} Grab bars in the bathroom? {YES/NO:21197} Shower chair or bench in shower? {YES/NO:21197} Elevated toilet seat or a handicapped toilet? {YES/NO:21197}  TIMED UP AND GO:  Was the test performed? {YES/NO:21197}.  Length of time to ambulate 10 feet: *** sec.   {Appearance of YQMV:7846962}  Cognitive Function:        Immunizations Immunization History  Administered Date(s) Administered   Influenza,inj,Quad PF,6+ Mos 04/28/2014, 07/25/2016   Janssen (J&J) SARS-COV-2 Vaccination 11/03/2019   Pneumococcal Polysaccharide-23 04/28/2014, 03/01/2017   Tdap 09/15/2012    {TDAP status:2101805}  {Flu Vaccine status:2101806}  {Pneumococcal vaccine status:2101807}  {Covid-19 vaccine status:2101808}  Qualifies for Shingles Vaccine? {YES/NO:21197}  Zostavax completed {YES/NO:21197}  {Shingrix Completed?:2101804}  Screening Tests Health Maintenance  Topic Date Due   FOOT EXAM  Never done   OPHTHALMOLOGY EXAM  Never done   Zoster Vaccines- Shingrix (1 of 2) Never done   PAP SMEAR-Modifier  11/20/2016   COVID-19 Vaccine (2 - Janssen risk  series) 12/01/2019   HEMOGLOBIN A1C  09/21/2021   URINE MICROALBUMIN  03/24/2022   TETANUS/TDAP  09/16/2022   MAMMOGRAM  12/26/2022   COLONOSCOPY (Pts 45-40yrs Insurance coverage will need to be confirmed)  09/04/2024   Hepatitis C Screening  Completed   HIV Screening  Completed   HPV VACCINES  Aged Out    Health Maintenance  Health Maintenance Due  Topic Date Due   FOOT EXAM  Never done   OPHTHALMOLOGY EXAM  Never done   Zoster Vaccines- Shingrix (1 of 2) Never done   PAP SMEAR-Modifier  11/20/2016   COVID-19 Vaccine (2 - Janssen risk series) 12/01/2019    {Colorectal cancer screening:2101809}  {Mammogram status:21018020}  {Bone Density status:21018021}  Lung Cancer Screening: (Low Dose CT Chest recommended if Age 28-80 years, 30 pack-year currently smoking OR have quit w/in 15years.) {DOES NOT does:27190::"does not"} qualify.   Lung Cancer Screening Referral: ***  Additional Screening:  Hepatitis C Screening: {DOES NOT does:27190::"does not"} qualify; Completed ***  Vision Screening: Recommended annual ophthalmology exams for early detection of glaucoma and other disorders of the eye. Is the patient up to date with their annual eye exam?  {YES/NO:21197} Who is the provider or what is the name of the office in which the patient attends annual eye exams? *** If pt is not established with a provider, would they like to be referred to a provider to establish care? {YES/NO:21197}.   Dental Screening: Recommended annual dental exams for proper oral hygiene  Community Resource Referral / Chronic Care Management: CRR required this visit?  {YES/NO:21197}  CCM required this visit?  {YES/NO:21197}     Plan:     I have personally reviewed and noted the following in the patient's chart:   Medical and social history Use of alcohol, tobacco or illicit drugs  Current medications and supplements including opioid prescriptions. {Opioid Prescriptions:[phone]} Functional  ability and status Nutritional status Physical activity Advanced directives List of other physicians Hospitalizations, surgeries, and ER visits  in previous 12 months Vitals Screenings to include cognitive, depression, and falls Referrals and appointments  In addition, I have reviewed and discussed with patient certain preventive protocols, quality metrics, and best practice recommendations. A written personalized care plan for preventive services as well as general preventive health recommendations were provided to patient.     Lauralyn Primes, RMA   04/06/2021   Nurse Notes: ***

## 2021-04-14 ENCOUNTER — Other Ambulatory Visit: Payer: Self-pay

## 2021-04-14 DIAGNOSIS — E7849 Other hyperlipidemia: Secondary | ICD-10-CM

## 2021-04-14 MED ORDER — ATORVASTATIN CALCIUM 40 MG PO TABS: 40.0000 mg | ORAL_TABLET | Freq: Every day | ORAL | 1 refills | Status: DC

## 2021-04-25 ENCOUNTER — Ambulatory Visit
Admission: RE | Admit: 2021-04-25 | Discharge: 2021-04-25 | Disposition: A | Discharge status: Home / Self Care | Payer: Medicare Other | Source: Ambulatory Visit | Attending: Nurse Practitioner | Admitting: Nurse Practitioner

## 2021-04-25 ENCOUNTER — Other Ambulatory Visit: Payer: Self-pay

## 2021-04-25 DIAGNOSIS — N6310 Unspecified lump in the right breast, unspecified quadrant: Secondary | ICD-10-CM | POA: Diagnosis not present

## 2021-04-25 DIAGNOSIS — R928 Other abnormal and inconclusive findings on diagnostic imaging of breast: Secondary | ICD-10-CM | POA: Diagnosis not present

## 2021-05-06 ENCOUNTER — Encounter: Payer: Self-pay | Admitting: Nurse Practitioner

## 2021-05-06 ENCOUNTER — Ambulatory Visit (INDEPENDENT_AMBULATORY_CARE_PROVIDER_SITE_OTHER): Payer: Medicare Other | Admitting: Nurse Practitioner

## 2021-05-06 ENCOUNTER — Other Ambulatory Visit: Payer: Self-pay

## 2021-05-06 VITALS — BP 143/77 | HR 80 | Temp 98.1°F | Ht 61.0 in | Wt 266.2 lb

## 2021-05-06 DIAGNOSIS — E119 Type 2 diabetes mellitus without complications: Secondary | ICD-10-CM | POA: Diagnosis not present

## 2021-05-06 DIAGNOSIS — E7849 Other hyperlipidemia: Secondary | ICD-10-CM

## 2021-05-06 DIAGNOSIS — M4310 Spondylolisthesis, site unspecified: Secondary | ICD-10-CM | POA: Insufficient documentation

## 2021-05-06 DIAGNOSIS — I10 Essential (primary) hypertension: Secondary | ICD-10-CM | POA: Diagnosis not present

## 2021-05-06 DIAGNOSIS — Z01419 Encounter for gynecological examination (general) (routine) without abnormal findings: Secondary | ICD-10-CM | POA: Diagnosis not present

## 2021-05-06 LAB — POCT URINALYSIS DIP (CLINITEK)
Bilirubin, UA: NEGATIVE
Glucose, UA: NEGATIVE mg/dL
Ketones, POC UA: NEGATIVE mg/dL
Leukocytes, UA: NEGATIVE
Nitrite, UA: NEGATIVE
POC PROTEIN,UA: NEGATIVE
Spec Grav, UA: 1.02 (ref 1.010–1.025)
Urobilinogen, UA: 0.2 E.U./dL
pH, UA: 5.5 (ref 5.0–8.0)

## 2021-05-06 LAB — POCT GLYCOSYLATED HEMOGLOBIN (HGB A1C)
HbA1c POC (<> result, manual entry): 6.1 % (ref 4.0–5.6)
HbA1c, POC (controlled diabetic range): 6.1 % (ref 0.0–7.0)
HbA1c, POC (prediabetic range): 6.1 % (ref 5.7–6.4)
Hemoglobin A1C: 6.1 % — AB (ref 4.0–5.6)

## 2021-05-06 NOTE — Patient Instructions (Addendum)
Breast Self-Awareness Breast self-awareness is knowing how your breasts look and feel. Doing breast self-awareness is important. It allows you to catch a breast problem early while it is still small and can be treated. All women should do breast self-awareness, including women who have had breast implants. Tell your doctor if you notice a change in your breasts. What you need: A mirror. A well-lit room. How to do a breast self-exam A breast self-exam is one way to learn what is normal for your breasts and to check for changes. To do a breast self-exam: Look for changes  Take off all the clothes above your waist. Stand in front of a mirror in a room with good lighting. Put your hands on your hips. Push your hands down. Look at your breasts and nipples in the mirror to see if one breast or nipple looks different from the other. Check to see if: The shape of one breast is different. The size of one breast is different. There are wrinkles, dips, and bumps in one breast and not the other. Look at each breast for changes in the skin, such as: Redness. Scaly areas. Look for changes in your nipples, such as: Liquid around the nipples. Bleeding. Dimpling. Redness. A change in where the nipples are. Feel for changes  Lie on your back on the floor. Feel each breast. To do this, follow these steps: Pick a breast to feel. Put the arm closest to that breast above your head. Use your other arm to feel the nipple area of your breast. Feel the area with the pads of your three middle fingers by making small circles with your fingers. For the first circle, press lightly. For the second circle, press harder. For the third circle, press even harder. Keep making circles with your fingers at the different pressures as you move down your breast. Stop when you feel your ribs. Move your fingers a little toward the center of your body. Start making circles with your fingers again, this time going up until  you reach your collarbone. Keep making up-and-down circles until you reach your armpit. Remember to keep using the three pressures. Feel the other breast in the same way. Sit or stand in the tub or shower. With soapy water on your skin, feel each breast the same way you did in step 2 when you were lying on the floor. Write down what you find Writing down what you find can help you remember what to tell your doctor. Write down: What is normal for each breast. Any changes you find in each breast, including: The kind of changes you find. Whether you have pain. Size and location of any lumps. When you last had your menstrual period. General tips Check your breasts every month. If you are breastfeeding, the best time to check your breasts is after you feed your baby or after you use a breast pump. If you get menstrual periods, the best time to check your breasts is 5-7 days after your menstrual period is over. With time, you will become comfortable with the self-exam, and you will begin to know if there are changes in your breasts. Contact a doctor if you: See a change in the shape or size of your breasts or nipples. See a change in the skin of your breast or nipples, such as red or scaly skin. Have fluid coming from your nipples that is not normal. Find a lump or thick area that was not there before. Have pain in   your breasts. Have any concerns about your breast health. Summary Breast self-awareness includes looking for changes in your breasts, as well as feeling for changes within your breasts. Breast self-awareness should be done in front of a mirror in a well-lit room. You should check your breasts every month. If you get menstrual periods, the best time to check your breasts is 5-7 days after your menstrual period is over. Let your doctor know of any changes you see in your breasts, including changes in size, changes on the skin, pain or tenderness, or fluid from your nipples that is not  normal. This information is not intended to replace advice given to you by your health care provider. Make sure you discuss any questions you have with your health care provider. Document Revised: 02/19/2018 Document Reviewed: 02/19/2018 Elsevier Patient Education  [place]. Diabetes Mellitus and Nutrition, Adult When you have diabetes, or diabetes mellitus, it is very important to have healthy eating habits because your blood sugar (glucose) levels are greatly affected by what you eat and drink. Eating healthy foods in the right amounts, at about the same times every day, can help you: Control your blood glucose. Lower your risk of heart disease. Improve your blood pressure. Reach or maintain a healthy weight. What can affect my meal plan? Every person with diabetes is different, and each person has different needs for a meal plan. Your health care provider may recommend that you work with a dietitian to make a meal plan that is best for you. Your meal plan may vary depending on factors such as: The calories you need. The medicines you take. Your weight. Your blood glucose, blood pressure, and cholesterol levels. Your activity level. Other health conditions you have, such as heart or kidney disease. How do carbohydrates affect me? Carbohydrates, also called carbs, affect your blood glucose level more than any other type of food. Eating carbs naturally raises the amount of glucose in your blood. Carb counting is a method for keeping track of how many carbs you eat. Counting carbs is important to keep your blood glucose at a healthy level, especially if you use insulin or take certain oral diabetes medicines. It is important to know how many carbs you can safely have in each meal. This is different for every person. Your dietitian can help you calculate how many carbs you should have at each meal and for each snack. How does alcohol affect me? Alcohol can cause a sudden decrease in  blood glucose (hypoglycemia), especially if you use insulin or take certain oral diabetes medicines. Hypoglycemia can be a life-threatening condition. Symptoms of hypoglycemia, such as sleepiness, dizziness, and confusion, are similar to symptoms of having too much alcohol. Do not drink alcohol if: Your health care provider tells you not to drink. You are pregnant, may be pregnant, or are planning to become pregnant. If you drink alcohol: Do not drink on an empty stomach. Limit how much you use to: 0-1 drink a day for women. 0-2 drinks a day for men. Be aware of how much alcohol is in your drink. In the [place] one drink equals one 12 oz bottle of beer (355 mL), one 5 oz glass of wine (148 mL), or one 1 oz glass of hard liquor (44 mL). Keep yourself hydrated with water, diet soda, or unsweetened iced tea. Keep in mind that regular soda, juice, and other mixers may contain a lot of sugar and must be counted as carbs. What are tips for following  this plan? Reading food labels Start by checking the serving size on the "Nutrition Facts" label of packaged foods and drinks. The amount of calories, carbs, fats, and other nutrients listed on the label is based on one serving of the item. Many items contain more than one serving per package. Check the total grams (g) of carbs in one serving. You can calculate the number of servings of carbs in one serving by dividing the total carbs by 15. For example, if a food has 30 g of total carbs per serving, it would be equal to 2 servings of carbs. Check the number of grams (g) of saturated fats and trans fats in one serving. Choose foods that have a low amount or none of these fats. Check the number of milligrams (mg) of salt (sodium) in one serving. Most people should limit total sodium intake to less than 2,300 mg per day. Always check the nutrition information of foods labeled as "low-fat" or "nonfat." These foods may be higher in added sugar or refined carbs and  should be avoided. Talk to your dietitian to identify your daily goals for nutrients listed on the label. Shopping Avoid buying canned, pre-made, or processed foods. These foods tend to be high in fat, sodium, and added sugar. Shop around the outside edge of the grocery store. This is where you will most often find fresh fruits and vegetables, bulk grains, fresh meats, and fresh dairy. Cooking Use low-heat cooking methods, such as baking, instead of high-heat cooking methods like deep frying. Cook using healthy oils, such as olive, canola, or sunflower oil. Avoid cooking with butter, cream, or high-fat meats. Meal planning Eat meals and snacks regularly, preferably at the same times every day. Avoid going long periods of time without eating. Eat foods that are high in fiber, such as fresh fruits, vegetables, beans, and whole grains. Talk with your dietitian about how many servings of carbs you can eat at each meal. Eat 4-6 oz (112-168 g) of lean protein each day, such as lean meat, chicken, fish, eggs, or tofu. One ounce (oz) of lean protein is equal to: 1 oz (28 g) of meat, chicken, or fish. 1 egg.  cup (62 g) of tofu. Eat some foods each day that contain healthy fats, such as avocado, nuts, seeds, and fish. What foods should I eat? Fruits Berries. Apples. Oranges. Peaches. Apricots. Plums. Grapes. Mango. Papaya. Pomegranate. Kiwi. Cherries. Vegetables Lettuce. Spinach. Leafy greens, including kale, chard, collard greens, and mustard greens. Beets. Cauliflower. Cabbage. Broccoli. Carrots. Green beans. Tomatoes. Peppers. Onions. Cucumbers. Brussels sprouts. Grains Whole grains, such as whole-wheat or whole-grain bread, crackers, tortillas, cereal, and pasta. Unsweetened oatmeal. Quinoa. Brown or wild rice. Meats and other proteins Seafood. Poultry without skin. Lean cuts of poultry and beef. Tofu. Nuts. Seeds. Dairy Low-fat or fat-free dairy products such as milk, yogurt, and cheese. The  items listed above may not be a complete list of foods and beverages you can eat. Contact a dietitian for more information. What foods should I avoid? Fruits Fruits canned with syrup. Vegetables Canned vegetables. Frozen vegetables with butter or cream sauce. Grains Refined white flour and flour products such as bread, pasta, snack foods, and cereals. Avoid all processed foods. Meats and other proteins Fatty cuts of meat. Poultry with skin. Breaded or fried meats. Processed meat. Avoid saturated fats. Dairy Full-fat yogurt, cheese, or milk. Beverages Sweetened drinks, such as soda or iced tea. The items listed above may not be a complete list of foods  and beverages you should avoid. Contact a dietitian for more information. Questions to ask a health care provider Do I need to meet with a diabetes educator? Do I need to meet with a dietitian? What number can I call if I have questions? When are the best times to check my blood glucose? Where to find more information: American Diabetes Association: diabetes.org Academy of Nutrition and Dietetics: www.eatright.[employer] of Diabetes and Digestive and Kidney Diseases: [employer] Association of Diabetes Care and Education Specialists: www.diabeteseducator.org Summary It is important to have healthy eating habits because your blood sugar (glucose) levels are greatly affected by what you eat and drink. A healthy meal plan will help you control your blood glucose and maintain a healthy lifestyle. Your health care provider may recommend that you work with a dietitian to make a meal plan that is best for you. Keep in mind that carbohydrates (carbs) and alcohol have immediate effects on your blood glucose levels. It is important to count carbs and to use alcohol carefully. This information is not intended to replace advice given to you by your health care provider. Make sure you discuss any questions you have with your health  care provider. Document Revised: 06/10/2019 Document Reviewed: 06/10/2019 Elsevier Patient Education  2021 [employer].

## 2021-05-06 NOTE — Addendum Note (Signed)
Addended by: Blair Heys L on: 05/06/2021 09:36 AM   Modules accepted: Orders

## 2021-05-06 NOTE — Progress Notes (Signed)
[place], [place]  [address] Phone:  [phone]   Fax:  [phone]   Established Patient Office Visit  Subjective:  Patient ID: Hannah Huerta, female    DOB: 04-29-1961  Age: 60 y.o. MRN: [id number]  CC:  Chief Complaint  Patient presents with   Follow-up    Follow up; Diabetes     HPI Hannah Huerta presents for follow up. She  has a past medical history of Abnormal Pap smear of cervix, Anemia, Arthritis, CAD in native artery (06/05/2015), Carpal tunnel syndrome, bilateral, Chest pain, CHF (congestive heart failure) ([place]), Chronic migraine, Diabetes mellitus without complication ([place]), GERD (gastroesophageal reflux disease), HLD (hyperlipidemia), Hypertension, Hypokalemia (76/19/5093), Metabolic syndrome (26/71/2458), Morbid obesity with BMI of 50.0-59.9, adult ([place]), Prediabetes, S/P cardiac catheterization, non obstructive disease 06/04/15 (06/05/2015), Sickle cell trait ([place]), Spondylolisthesis of lumbar region, Syncope, and Wears glasses.   She is in today for a PAP test and labs. She is doing well. Her breast evaluation was completed and resulted negative.   Past Medical History:  Diagnosis Date   Abnormal Pap smear of cervix    pt couldnt state what type of abnormality   Anemia    Arthritis    CAD in native artery 06/05/2015   Carpal tunnel syndrome, bilateral    Chest pain    a. 02/2012 abnl myoview - inferoapical reverisbility concerning for ischemia, EF 59%;   02/2012 nonobstructive cath   CHF (congestive heart failure) (HCC)    Chronic migraine    Diabetes mellitus without complication (HCC)    GERD (gastroesophageal reflux disease)    HLD (hyperlipidemia)    Hypertension    Hypokalemia 09/98/3382   Metabolic syndrome 50/53/9767   Morbid obesity with BMI of 50.0-59.9, adult ([place])    Prediabetes    S/P cardiac catheterization, non obstructive disease 06/04/15 06/05/2015   Sickle cell trait (HCC)    Spondylolisthesis  of lumbar region    Syncope    Wears glasses     Past Surgical History:  Procedure Laterality Date   BACK SURGERY     x1 lumbar fusion   CARDIAC CATHETERIZATION N/A 06/04/2015   Procedure: Left Heart Cath and Coronary Angiography;  Surgeon: [name], MD;  Location: [facility];  Service: Cardiovascular;  Laterality: N/A;   CHOLECYSTECTOMY     laparoscopic   COLONOSCOPY WITH PROPOFOL N/A 09/04/2014   Procedure: COLONOSCOPY WITH PROPOFOL;  Surgeon: [name], MD;  Location: [facility];  Service: Endoscopy;  Laterality: N/A;   LEFT HEART CATHETERIZATION WITH CORONARY ANGIOGRAM N/A 03/08/2012   Procedure: LEFT HEART CATHETERIZATION WITH CORONARY ANGIOGRAM;  Surgeon: Hillary Bow, MD;  Location: [facility] CATH LAB;  Service: Cardiovascular;  Laterality: N/A;   SPINE SURGERY     fusion   TUBAL LIGATION      Family History  Problem Relation Age of Onset   Coronary artery disease Father 39   Heart disease Father    Other Father        died in a MVA   Hypertension Father    Sudden death Mother 28       Questionable MI   Diabetes Mother    Hypertension Mother    Hypertension Sister    Pancreatic cancer Maternal Aunt    Breast cancer Maternal Aunt        after age 59   Stroke Paternal 34    Colon polyps Sister  Colon cancer Neg Hx    Gallbladder disease Neg Hx    Esophageal cancer Neg Hx     Social History   Socioeconomic History   Marital status: Widowed    Spouse name: Not on file   Number of children: 3   Years of education: Not on file   Highest education level: Not on file  Occupational History   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   Social Determinants of Health   Financial Resource Strain: Not on file  Food Insecurity: Not on file  Transportation Needs: Not on file  Physical Activity: Not on file  Stress: Not on file  Social Connections: Not on file  Intimate Partner Violence: Not on file    Outpatient Medications Prior to Visit  Medication Sig Dispense Refill   amLODipine (NORVASC) 5 MG tablet Take 1 tablet (5 mg total) by mouth daily. 90 tablet 3   aspirin EC 81 MG tablet Take 1 tablet (81 mg total) by mouth daily. 30 tablet 11   atorvastatin (LIPITOR) 40 MG tablet Take 1 tablet (40 mg total) by mouth daily. 90 tablet 1   carvedilol (COREG) 6.25 MG tablet Take 1 tablet (6.25 mg total) by mouth 2 (two) times daily with a meal. TAKE 1 TABLET BY MOUTH 2 TIMES DAILY WITH A MEAL. 180 tablet 3   spironolactone (ALDACTONE) 25 MG tablet Take 1 tablet (25 mg total) by mouth daily. 90 tablet 3   metFORMIN (GLUCOPHAGE) 500 MG tablet Take 1 tablet (500 mg total) by mouth 2 (two) times daily with a meal. 90 tablet 3   No facility-administered medications prior to visit.    No Known Allergies  ROS Review of Systems    Objective:    Physical Exam Exam conducted with a chaperone present.  Constitutional:      Appearance: She is obese.  HENT:     Head: Normocephalic and atraumatic.  Cardiovascular:     Rate and Rhythm: Normal rate.     Pulses: Normal pulses.  Genitourinary:    General: Normal vulva.     Vagina: Normal. No vaginal discharge.     Cervix: Normal.  Musculoskeletal:        General: Normal range of motion.     Cervical back: Normal range of motion.  Skin:    General: Skin is warm and dry.     Capillary Refill: Capillary refill takes less than 2 seconds.  Neurological:     General: No focal deficit present.     Mental Status: She is alert and oriented to person, place, and time.   BP (!) 143/77   Pulse 80   Temp 98.1 F (36.7 C)   Ht 5' 1"  (1.549 m)   Wt 266 lb  3.2 oz (120.7 kg)   LMP 09/15/2013   SpO2 97%   BMI 50.30 kg/m  Wt Readings from Last 3 Encounters:  05/06/21 266 lb 3.2 oz (120.7 kg)  03/24/21 272 lb (123.4 kg)  10/27/20 271 lb (122.9 kg)     There are no preventive care reminders  to display for this patient.   There are no preventive care reminders to display for this patient.  Lab Results  Component Value Date   TSH 1.570 09/08/2019   Lab Results  Component Value Date   WBC 4.4 10/27/2020   HGB 11.3 10/27/2020   HCT 35.4 10/27/2020   MCV 79 10/27/2020   PLT 305 10/27/2020   Lab Results  Component Value Date   NA 142 10/27/2020   K 4.6 10/27/2020   CO2 28 02/06/2020   GLUCOSE 98 10/27/2020   BUN 18 10/27/2020   CREATININE 0.87 10/27/2020   BILITOT 0.3 10/27/2020   ALKPHOS 111 10/27/2020   AST 21 10/27/2020   ALT 13 02/05/2019   PROT 7.8 10/27/2020   ALBUMIN 4.4 10/27/2020   CALCIUM 10.0 10/27/2020   ANIONGAP 10 02/06/2020   EGFR 77 10/27/2020   Lab Results  Component Value Date   CHOL 138 03/10/2020   Lab Results  Component Value Date   HDL 49 03/10/2020   Lab Results  Component Value Date   LDLCALC 74 03/10/2020   Lab Results  Component Value Date   TRIG 74 03/10/2020   Lab Results  Component Value Date   CHOLHDL 2.8 03/10/2020   Lab Results  Component Value Date   HGBA1C 6.1 (A) 05/06/2021   HGBA1C 6.1 05/06/2021   HGBA1C 6.1 05/06/2021   HGBA1C 6.1 05/06/2021      Assessment & Plan:   Problem List Items Addressed This Visit       Other   Hyperlipidemia   Relevant Orders   Lipid panel   Other Visit Diagnoses     Type 2 diabetes mellitus without complication, without long-term current use of insulin ([place])    -  Primary   Relevant Orders   HgB A1c (Completed)   Essential hypertension       Relevant Orders   Comp. Metabolic Panel (12)   Encntr for gyn exam (general) (routine) w/o abn findings       Relevant Orders   IGP, rfx Aptima HPV ASCU       No orders of the  defined types were placed in this encounter.   Follow-up: Return in about 3 months (around 08/06/2021).    Vevelyn Francois, NP

## 2021-05-07 LAB — COMP. METABOLIC PANEL (12)
AST: 18 IU/L (ref 0–40)
Albumin/Globulin Ratio: 1.2 (ref 1.2–2.2)
Albumin: 4.4 g/dL (ref 3.8–4.9)
Alkaline Phosphatase: 104 IU/L (ref 44–121)
BUN/Creatinine Ratio: 20 (ref 12–28)
BUN: 18 mg/dL (ref 8–27)
Bilirubin Total: 0.2 mg/dL (ref 0.0–1.2)
Calcium: 9.5 mg/dL (ref 8.7–10.3)
Chloride: 101 mmol/L (ref 96–106)
Creatinine, Ser: 0.88 mg/dL (ref 0.57–1.00)
Globulin, Total: 3.6 g/dL (ref 1.5–4.5)
Glucose: 100 mg/dL — ABNORMAL HIGH (ref 70–99)
Potassium: 4.2 mmol/L (ref 3.5–5.2)
Sodium: 142 mmol/L (ref 134–144)
Total Protein: 8 g/dL (ref 6.0–8.5)
eGFR: 75 mL/min/{1.73_m2} (ref >59)

## 2021-05-07 LAB — LIPID PANEL
Chol/HDL Ratio: 2.7 ratio (ref 0.0–4.4)
Cholesterol, Total: 149 mg/dL (ref 100–199)
HDL: 55 mg/dL (ref >39)
LDL Chol Calc (NIH): 81 mg/dL (ref 0–99)
Triglycerides: 64 mg/dL (ref 0–149)
VLDL Cholesterol Cal: 13 mg/dL (ref 5–40)

## 2021-05-12 LAB — IGP, RFX APTIMA HPV ASCU

## 2021-06-13 ENCOUNTER — Other Ambulatory Visit: Payer: Self-pay

## 2021-06-13 DIAGNOSIS — R7303 Prediabetes: Secondary | ICD-10-CM

## 2021-06-13 MED ORDER — METFORMIN HCL 500 MG PO TABS: 500.0000 mg | ORAL_TABLET | Freq: Two times a day (BID) | ORAL | 3 refills | Status: DC

## 2021-08-08 ENCOUNTER — Other Ambulatory Visit: Payer: Self-pay

## 2021-08-08 ENCOUNTER — Encounter: Payer: Self-pay | Admitting: Nurse Practitioner

## 2021-08-08 ENCOUNTER — Ambulatory Visit (INDEPENDENT_AMBULATORY_CARE_PROVIDER_SITE_OTHER): Payer: Medicare HMO | Admitting: Nurse Practitioner

## 2021-08-08 VITALS — BP 154/82 | HR 77 | Temp 98.1°F | Ht 61.0 in | Wt 266.6 lb

## 2021-08-08 DIAGNOSIS — I10 Essential (primary) hypertension: Secondary | ICD-10-CM | POA: Diagnosis not present

## 2021-08-08 DIAGNOSIS — M792 Neuralgia and neuritis, unspecified: Secondary | ICD-10-CM | POA: Diagnosis not present

## 2021-08-08 DIAGNOSIS — Z13828 Encounter for screening for other musculoskeletal disorder: Secondary | ICD-10-CM

## 2021-08-08 DIAGNOSIS — E119 Type 2 diabetes mellitus without complications: Secondary | ICD-10-CM

## 2021-08-08 DIAGNOSIS — M47816 Spondylosis without myelopathy or radiculopathy, lumbar region: Secondary | ICD-10-CM | POA: Diagnosis not present

## 2021-08-08 DIAGNOSIS — E7849 Other hyperlipidemia: Secondary | ICD-10-CM | POA: Diagnosis not present

## 2021-08-08 LAB — POCT GLYCOSYLATED HEMOGLOBIN (HGB A1C)
HbA1c POC (<> result, manual entry): 6.1 % (ref 4.0–5.6)
HbA1c, POC (controlled diabetic range): 6.1 % (ref 0.0–7.0)
HbA1c, POC (prediabetic range): 6.1 % (ref 5.7–6.4)
Hemoglobin A1C: 6.1 % — AB (ref 4.0–5.6)

## 2021-08-08 MED ORDER — GABAPENTIN 100 MG PO CAPS: ORAL_CAPSULE | ORAL | 3 refills | Status: DC

## 2021-08-08 MED ORDER — DICLOFENAC SODIUM 1 % EX CREA: 4.0000 g | TOPICAL_CREAM | Freq: Four times a day (QID) | CUTANEOUS | 0 refills | Status: AC | PRN

## 2021-08-08 MED ORDER — ATORVASTATIN CALCIUM 40 MG PO TABS: 40.0000 mg | ORAL_TABLET | Freq: Every day | ORAL | 1 refills | Status: DC

## 2021-08-08 NOTE — Patient Instructions (Signed)
Arthritis Arthritis means joint pain. It can also mean joint disease. A joint is a place where bones come together. There are more than 100 types of arthritis. What are the causes? This condition may be caused by: Wear and tear of a joint. This is the most common cause. A lot of acid in the blood, which leads to pain in the joint (gout). Pain and swelling (inflammation) in a joint. Infection of a joint. Injuries in the joint. A reaction to medicines (allergy). In some cases, the cause may not be known. What are the signs or symptoms? Symptoms of this condition include: Redness at a joint. Swelling at a joint. Stiffness at a joint. Warmth coming from the joint. A fever. A feeling of being sick. How is this treated? This condition may be treated with: Treating the cause, if it is known. Rest. Raising (elevating) the joint. Putting cold or hot packs on the joint. Medicines to treat symptoms and reduce pain and swelling. Shots of medicines (cortisone) into the joint. You may also be told to make changes in your life, such as doing exercises and losing weight. Follow these instructions at home: Medicines Take over-the-counter and prescription medicines only as told by your doctor. Do not take aspirin for pain if your doctor says that you may have gout. Activity Rest your joint if your doctor tells you to. Avoid activities that make the pain worse. Exercise your joint regularly as told by your doctor. Try doing exercises like: Swimming. Water aerobics. Biking. Walking. Managing pain, stiffness, and swelling   If told, put ice on the affected area. Put ice in a plastic bag. Place a towel between your skin and the bag. Leave the ice on for 20 minutes, 2-3 times per day. If your joint is swollen, raise (elevate) it above the level of your heart if told by your doctor. If your joint feels stiff in the morning, try taking a warm shower. If told, put heat on the affected area. Do  this as often as told by your doctor. Use the heat source that your doctor recommends, such as a moist heat pack or a heating pad. If you have diabetes, do not apply heat without asking your doctor. To apply heat: Place a towel between your skin and the heat source. Leave the heat on for 20-30 minutes. Remove the heat if your skin turns bright red. This is very important if you are unable to feel pain, heat, or cold. You may have a greater risk of getting burned. General instructions Do not use any products that contain nicotine or tobacco, such as cigarettes, e-cigarettes, and chewing tobacco. If you need help quitting, ask your doctor. Keep all follow-up visits as told by your doctor. This is important. Contact a doctor if: The pain gets worse. You have a fever. Get help right away if: You have very bad pain in your joint. You have swelling in your joint. Your joint is red. Many joints become painful and swollen. You have very bad back pain. Your leg is very weak. You cannot control your pee (urine) or poop (stool). Summary Arthritis means joint pain. It can also mean joint disease. A joint is a place where bones come together. The most common cause of this condition is wear and tear of a joint. Symptoms of this condition include redness, swelling, or stiffness of the joint. This condition is treated with rest, raising the joint, medicines, and putting cold or hot packs on the joint. Follow your  doctor's instructions about medicines, activity, exercises, and other home care treatments. This information is not intended to replace advice given to you by your health care provider. Make sure you discuss any questions you have with your health care provider. Document Revised: 06/10/2018 Document Reviewed: 06/10/2018 Elsevier Patient Education  2022 [employer].

## 2021-08-08 NOTE — Progress Notes (Signed)
[place], [place]  [address] Phone:  [phone]   Fax:  [phone]   Established Patient Office Visit  Subjective:  Patient ID: Hannah Huerta, female    DOB: 26-Apr-1961  Age: 61 y.o. MRN: [id number]  CC:  Chief Complaint  Patient presents with   Follow-up    Pt is here today for her 3 month follow up visit. Pt states that she is needing something for her rheumatoid arthritis and would also like a referral to a Rheumatology.    HPI [name] presents for follow up. She  has a past medical history of Abnormal Pap smear of cervix, Anemia, Arthritis, CAD in native artery (06/05/2015), Carpal tunnel syndrome, bilateral, Chest pain, CHF (congestive heart failure) ([place]), Chronic migraine, Diabetes mellitus without complication ([place]), GERD (gastroesophageal reflux disease), HLD (hyperlipidemia), Hypertension, Hypokalemia (77/93/9030), Metabolic syndrome (04/08/3006), Morbid obesity with BMI of 50.0-59.9, adult ([place]), Prediabetes, S/P cardiac catheterization, non obstructive disease 06/04/15 (06/05/2015), Sickle cell trait ([place]), Spondylolisthesis of lumbar region, Syncope, and Wears glasses.   She is in today for follow up for HTN and DM. She is prescribed amlodipine 5 mg, carvedilol 6.25 mg BID and spironolactone 25 mg . She reports that she is compliant with her medication. She tries to eat well. She has maintained her weight. She not able to exercise due to her increased pain. She has arthritis is her back and hip. She has tried APAP for the pain. She avoids NSAID's due to possible effects to her kidney.   She is on Metformin 500 mg BID. Her current A1c is 6.1% which has been stable.  Denies fever, chills, headache, dizziness, visual changes, polydipsia,  polyphagia shortness of breath, dyspnea on exertion, chest pain, nausea, vomiting, polyuria, constipation, diarrhea, or any edema.    Past Medical History:  Diagnosis Date   Abnormal Pap  smear of cervix    pt couldnt state what type of abnormality   Anemia    Arthritis    CAD in native artery 06/05/2015   Carpal tunnel syndrome, bilateral    Chest pain    a. 02/2012 abnl myoview - inferoapical reverisbility concerning for ischemia, EF 59%;   02/2012 nonobstructive cath   CHF (congestive heart failure) (HCC)    Chronic migraine    Diabetes mellitus without complication (HCC)    GERD (gastroesophageal reflux disease)    HLD (hyperlipidemia)    Hypertension    Hypokalemia 62/26/3335   Metabolic syndrome 45/62/5638   Morbid obesity with BMI of 50.0-59.9, adult ([place])    Prediabetes    S/P cardiac catheterization, non obstructive disease 06/04/15 06/05/2015   Sickle cell trait (HCC)    Spondylolisthesis of lumbar region    Syncope    Wears glasses     Past Surgical History:  Procedure Laterality Date   BACK SURGERY     x1 lumbar fusion   CARDIAC CATHETERIZATION N/A 06/04/2015   Procedure: Left Heart Cath and Coronary Angiography;  Surgeon: [name], MD;  Location: [facility];  Service: Cardiovascular;  Laterality: N/A;   CHOLECYSTECTOMY     laparoscopic   COLONOSCOPY WITH PROPOFOL N/A 09/04/2014   Procedure: COLONOSCOPY WITH PROPOFOL;  Surgeon: [name], MD;  Location: [facility];  Service: Endoscopy;  Laterality: N/A;   LEFT HEART CATHETERIZATION WITH CORONARY ANGIOGRAM N/A 03/08/2012   Procedure: LEFT HEART CATHETERIZATION WITH CORONARY ANGIOGRAM;  Surgeon: Hillary Bow, MD;  Location: [facility]ATH LAB;  Service: Cardiovascular;  Laterality: N/A;   SPINE SURGERY     fusion   TUBAL LIGATION      Family History  Problem Relation Age of Onset   Coronary artery disease Father 23   Heart disease Father    Other Father        died in a MVA   Hypertension Father    Sudden death Mother 98       Questionable MI   Diabetes Mother    Hypertension Mother    Hypertension Sister    Pancreatic cancer Maternal Aunt    Breast cancer Maternal Aunt         after age 76   Stroke Paternal Grandmother    Colon polyps Sister    Colon cancer Neg Hx    Gallbladder disease Neg Hx    Esophageal cancer Neg Hx     Social History   Socioeconomic History   Marital status: Widowed    Spouse name: Not on file   Number of children: 3   Years of education: Not on file   Highest education level: Not on file  Occupational History   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   Social Determinants of Health   Financial Resource Strain: Not on file  Food Insecurity: Not on file  Transportation Needs: Not on file  Physical Activity: Not on file  Stress: Not on file  Social Connections: Not on file  Intimate Partner Violence: Not on file    Outpatient Medications Prior to Visit  Medication Sig Dispense Refill   amLODipine (NORVASC) 5 MG tablet Take 1 tablet (5 mg total) by mouth daily. 90 tablet 3   aspirin EC 81 MG tablet Take 1 tablet (81 mg total) by mouth daily. 30 tablet 11   carvedilol (COREG) 6.25 MG tablet Take 1 tablet (6.25 mg total) by mouth 2 (two) times daily with a meal. TAKE 1 TABLET BY MOUTH 2 TIMES DAILY WITH A MEAL. 180 tablet 3   metFORMIN (GLUCOPHAGE) 500 MG tablet Take 1 tablet (500 mg total) by mouth 2 (two) times daily with a meal. 90 tablet 3   spironolactone (ALDACTONE) 25 MG tablet Take 1 tablet (25 mg total) by mouth daily. 90 tablet 3   atorvastatin (LIPITOR) 40 MG tablet Take 1 tablet (40 mg total) by mouth daily. 90 tablet 1   No facility-administered medications prior to visit.    No Known Allergies  ROS Review of Systems    Objective:    Physical Exam Constitutional:       Appearance: She is obese.  HENT:     Head: Normocephalic and atraumatic.     Nose: Nose normal.     Mouth/Throat:     Mouth: Mucous membranes are moist.  Cardiovascular:     Rate and Rhythm: Normal rate and regular rhythm.     Pulses: Normal pulses.     Heart sounds: Normal heart sounds.  Pulmonary:     Effort: Pulmonary effort is normal.     Breath sounds: Normal breath sounds.  Abdominal:  General: Bowel sounds are normal.     Palpations: Abdomen is soft.  Musculoskeletal:        General: Normal range of motion.     Cervical back: Normal range of motion.     Right lower leg: No edema.     Left lower leg: No edema.  Skin:    General: Skin is warm and dry.     Capillary Refill: Capillary refill takes less than 2 seconds.  Neurological:     General: No focal deficit present.     Mental Status: She is alert and oriented to person, place, and time.  Psychiatric:        Mood and Affect: Mood normal.        Behavior: Behavior normal.        Thought Content: Thought content normal.        Judgment: Judgment normal.    BP (!) 154/82    Pulse 77    Temp 98.1 F (36.7 C)    Ht 5' 1"  (1.549 m)    Wt 266 lb 9.6 oz (120.9 kg)    LMP 09/15/2013    SpO2 98%    BMI 50.37 kg/m  Wt Readings from Last 3 Encounters:  08/08/21 266 lb 9.6 oz (120.9 kg)  05/06/21 266 lb 3.2 oz (120.7 kg)  03/24/21 272 lb (123.4 kg)     There are no preventive care reminders to display for this patient.   There are no preventive care reminders to display for this patient.  Lab Results  Component Value Date   TSH 1.570 09/08/2019   Lab Results  Component Value Date   WBC 4.4 10/27/2020   HGB 11.3 10/27/2020   HCT 35.4 10/27/2020   MCV 79 10/27/2020   PLT 305 10/27/2020   Lab Results  Component Value Date   NA 142 05/06/2021   K 4.2 05/06/2021   CO2 28 02/06/2020   GLUCOSE 100 (H) 05/06/2021   BUN 18 05/06/2021   CREATININE 0.88 05/06/2021   BILITOT 0.2 05/06/2021   ALKPHOS 104  05/06/2021   AST 18 05/06/2021   ALT 13 02/05/2019   PROT 8.0 05/06/2021   ALBUMIN 4.4 05/06/2021   CALCIUM 9.5 05/06/2021   ANIONGAP 10 02/06/2020   EGFR 75 05/06/2021   Lab Results  Component Value Date   CHOL 149 05/06/2021   Lab Results  Component Value Date   HDL 55 05/06/2021   Lab Results  Component Value Date   LDLCALC 81 05/06/2021   Lab Results  Component Value Date   TRIG 64 05/06/2021   Lab Results  Component Value Date   CHOLHDL 2.7 05/06/2021   Lab Results  Component Value Date   HGBA1C 6.1 (A) 08/08/2021   HGBA1C 6.1 08/08/2021   HGBA1C 6.1 08/08/2021   HGBA1C 6.1 08/08/2021      Assessment & Plan:   Problem List Items Addressed This Visit       Musculoskeletal and Integument   Arthropathy of lumbar facet joint Persistent  Patient considering referral to rheumatology for treatment arthritis Additional education provided; patient decided to hold oral at this time     Other   Hyperlipidemia Controlled Continue with current regimen.  No changes warranted. Good patient compliance.    Relevant Medications   atorvastatin (LIPITOR) 40 MG tablet   Other Relevant Orders   Lipid panel   Other Visit Diagnoses     Type 2 diabetes mellitus without complication, without long-term current use of  insulin (HCC)    -  Primary Controlled current A1c remains 6.1% Encourage compliance with current treatment regimen  Encourage regular CBG monitoring Encourage contacting office if excessive hyperglycemia and or hypoglycemia Continue lifestyle modification with healthy diet (fewer calories, more high fiber foods, whole grains and non-starchy vegetables, lower fat meat and fish, low-fat diary include healthy oils) regular exercise (physical activity) and weight loss Opthalmology exam discussed  Home BP monitoring also encouraged goal <130/80    Relevant Medications   atorvastatin (LIPITOR) 40 MG tablet   Other Relevant Orders   HgB A1c (Completed)    Comp. Metabolic Panel (12)   Screening for rheumatoid arthritis       Relevant Orders   Ambulatory referral to Rheumatology   Neuropathic pain     Persistent Started gabapentin 100 mg nightly amy taper up to 200 mg nightly if needed   Essential hypertension     Persistent Encouraged on going compliance with current medication regimen Encouraged home monitoring and recording BP <130/80 Eating a heart-healthy diet with less salt Encouraged regular physical activity  Recommend Weight loss     Relevant Medications   atorvastatin (LIPITOR) 40 MG tablet       Meds ordered this encounter  Medications   Diclofenac Sodium 1 % CREA    Sig: Place 4 g onto the skin 4 (four) times daily as needed (Apply a 4g (4.5-in) line to each knee.).    Dispense:  100 g    Refill:  0    Order Specific Question:   Supervising Provider    Answer:   Tresa Garter [[id number]]   atorvastatin (LIPITOR) 40 MG tablet    Sig: Take 1 tablet (40 mg total) by mouth daily.    Dispense:  90 tablet    Refill:  1    Order Specific Question:   Supervising Provider    Answer:   Tresa Garter [[id number]]   gabapentin (NEURONTIN) 100 MG capsule    Sig: Take 1 capsule (100 mg total) by mouth at bedtime for 180 days, THEN 2 capsules (200 mg total) at bedtime.    Dispense:  60 capsule    Refill:  3    Order Specific Question:   Supervising Provider    Answer:   Tresa Garter [[id number]]   s Follow-up: Return in about 3 months (around 11/06/2021) for follow up [id number].    Vevelyn Francois, NP

## 2021-08-09 LAB — COMP. METABOLIC PANEL (12)
AST: 19 IU/L (ref 0–40)
Albumin/Globulin Ratio: 1.3 (ref 1.2–2.2)
Albumin: 4.4 g/dL (ref 3.8–4.9)
Alkaline Phosphatase: 101 IU/L (ref 44–121)
BUN/Creatinine Ratio: 14 (ref 12–28)
BUN: 12 mg/dL (ref 8–27)
Bilirubin Total: 0.3 mg/dL (ref 0.0–1.2)
Calcium: 9.5 mg/dL (ref 8.7–10.3)
Chloride: 104 mmol/L (ref 96–106)
Creatinine, Ser: 0.88 mg/dL (ref 0.57–1.00)
Globulin, Total: 3.4 g/dL (ref 1.5–4.5)
Glucose: 100 mg/dL — ABNORMAL HIGH (ref 70–99)
Potassium: 4.1 mmol/L (ref 3.5–5.2)
Sodium: 144 mmol/L (ref 134–144)
Total Protein: 7.8 g/dL (ref 6.0–8.5)
eGFR: 75 mL/min/{1.73_m2} (ref >59)

## 2021-08-09 LAB — LIPID PANEL
Chol/HDL Ratio: 2.5 ratio (ref 0.0–4.4)
Cholesterol, Total: 137 mg/dL (ref 100–199)
HDL: 54 mg/dL (ref >39)
LDL Chol Calc (NIH): 70 mg/dL (ref 0–99)
Triglycerides: 60 mg/dL (ref 0–149)
VLDL Cholesterol Cal: 13 mg/dL (ref 5–40)

## 2021-09-01 ENCOUNTER — Emergency Department (HOSPITAL_COMMUNITY): Payer: Medicare HMO

## 2021-09-01 ENCOUNTER — Emergency Department (HOSPITAL_COMMUNITY)
Admission: EM | Admit: 2021-09-01 | Discharge: 2021-09-01 | Disposition: A | Discharge status: Home / Self Care | Payer: Medicare HMO | Attending: Emergency Medicine | Admitting: Emergency Medicine

## 2021-09-01 DIAGNOSIS — Z7984 Long term (current) use of oral hypoglycemic drugs: Secondary | ICD-10-CM | POA: Diagnosis not present

## 2021-09-01 DIAGNOSIS — Z79899 Other long term (current) drug therapy: Secondary | ICD-10-CM | POA: Insufficient documentation

## 2021-09-01 DIAGNOSIS — I10 Essential (primary) hypertension: Secondary | ICD-10-CM | POA: Insufficient documentation

## 2021-09-01 DIAGNOSIS — Z7982 Long term (current) use of aspirin: Secondary | ICD-10-CM | POA: Insufficient documentation

## 2021-09-01 DIAGNOSIS — M25551 Pain in right hip: Secondary | ICD-10-CM | POA: Insufficient documentation

## 2021-09-01 MED ORDER — LIDOCAINE 5 % EX PTCH: 1.0000 | MEDICATED_PATCH | CUTANEOUS | 0 refills | Status: DC

## 2021-09-01 MED ORDER — NAPROXEN 375 MG PO TABS: 375.0000 mg | ORAL_TABLET | Freq: Two times a day (BID) | ORAL | 0 refills | Status: AC

## 2021-09-01 MED ORDER — KETOROLAC TROMETHAMINE 30 MG/ML IJ SOLN
30.0000 mg | Freq: Once | INTRAMUSCULAR | Status: AC
  Administered 2021-09-01: 30 mg via INTRAMUSCULAR
  Filled 2021-09-01: qty 1

## 2021-09-01 NOTE — ED Provider Notes (Signed)
[facility] DEPARTMENT Provider Note   CSN: [id number] Arrival date & time: 09/01/21  6160     History  Chief Complaint  Patient presents with   Hip Pain    Hannah Huerta is a 61 y.o. female with a past history of lumbar spinal stenosis, hypertension, spondylolisthesis of the lumbar spine and right-sided foot drop presenting today due to complaint of right hip pain.  She says that this has been going on for a week.  She has used some old hydrocodone from a dental procedure and this has not been helping her.  She says that heat pack helps her she lays very still.  Denies any inciting injury.  Endorses a history of rheumatoid arthritis and that the rain lately has been making her discomfort worse.  Denies any numbness or tingling, no pain in her back and is still able to ambulate with a cane however prior to this right hip pain she did not use a cane to walk.      Home Medications Prior to Admission medications   Medication Sig Start Date End Date Taking? Authorizing Provider  amLODipine (NORVASC) 5 MG tablet Take 1 tablet (5 mg total) by mouth daily. 03/14/21   Vevelyn Francois, NP  aspirin EC 81 MG tablet Take 1 tablet (81 mg total) by mouth daily. 03/07/17   Dorena Dew, FNP  atorvastatin (LIPITOR) 40 MG tablet Take 1 tablet (40 mg total) by mouth daily. 08/08/21   Vevelyn Francois, NP  carvedilol (COREG) 6.25 MG tablet Take 1 tablet (6.25 mg total) by mouth 2 (two) times daily with a meal. TAKE 1 TABLET BY MOUTH 2 TIMES DAILY WITH A MEAL. 10/27/20 10/27/21  Vevelyn Francois, NP  Diclofenac Sodium 1 % CREA Place 4 g onto the skin 4 (four) times daily as needed (Apply a 4g (4.5-in) line to each knee.). 08/08/21 09/07/21  Vevelyn Francois, NP  gabapentin (NEURONTIN) 100 MG capsule Take 1 capsule (100 mg total) by mouth at bedtime for 180 days, THEN 2 capsules (200 mg total) at bedtime. 08/08/21 08/03/22  Vevelyn Francois, NP  metFORMIN (GLUCOPHAGE) 500 MG tablet Take 1  tablet (500 mg total) by mouth 2 (two) times daily with a meal. 06/13/21 12/10/21  Vevelyn Francois, NP  spironolactone (ALDACTONE) 25 MG tablet Take 1 tablet (25 mg total) by mouth daily. 10/27/20 10/27/21  Vevelyn Francois, NP      Allergies    Patient has no known allergies.    Review of Systems   Review of Systems  Physical Exam Updated Vital Signs BP (!) 153/117 (BP Location: Right Arm)    Pulse 65    Temp 98.6 F (37 C) (Oral)    Resp 17    LMP 09/15/2013    SpO2 94%  Physical Exam Vitals and nursing note reviewed.  Constitutional:      Appearance: Normal appearance.  HENT:     Head: Normocephalic and atraumatic.  Eyes:     General: No scleral icterus.    Conjunctiva/sclera: Conjunctivae normal.  Pulmonary:     Effort: Pulmonary effort is normal. No respiratory distress.  Musculoskeletal:        General: Tenderness (To right hip, mostly localized to muscles however some bony tenderness.  Strong DP pulse) present. No swelling. Normal range of motion.     Right lower leg: No edema.     Left lower leg: No edema.  Skin:    General:  Skin is warm and dry.     Findings: No rash.  Neurological:     Mental Status: She is alert.     Sensory: No sensory deficit.     Motor: No weakness.  Psychiatric:        Mood and Affect: Mood normal.    ED Results / Procedures / Treatments   Labs (all labs ordered are listed, but only abnormal results are displayed) Labs Reviewed - No data to display  EKG None  Radiology DG Hip Unilat W or Wo Pelvis 2-3 Views Right  Result Date: 09/01/2021 CLINICAL DATA:  Right hip pain EXAM: DG HIP (WITH OR WITHOUT PELVIS) 2-3V RIGHT COMPARISON:  None. FINDINGS: There is no evidence of hip fracture or dislocation. There is no evidence of significant arthropathy or other focal bone abnormality. IMPRESSION: No acute osseous abnormality identified. Electronically Signed   By: Ofilia Neas M.D.   On: 09/01/2021 10:34    Procedures Procedures     Medications Ordered in ED Medications - No data to display  ED Course/ Medical Decision Making/ A&P                           Medical Decision Making Amount and/or Complexity of Data Reviewed Radiology: ordered.  Risk Prescription drug management.   61 year old female presenting with a complaint of right hip pain.  This is atraumatic.  History of rheumatoid arthritis and spondylolisthesis and lumbar spinal stenosis however denies any back pain.  Physical exam: Patient with tenderness over the right hip, no back tenderness.  Full range of motion of the hip with the majority of her pain exacerbated by hip extension  Imaging: I individually ordered and interpreted the x-ray of her right hip.  There were no signs of fracture, dislocation or bone spurs.  I agree with the radiologist.  Medications: Given toradol which somewhat helped her discomfort.   Disposition: Patient is neurovascularly intact.  No signs of cord involvement.  I believe she is stable for discharge with twice daily naproxen and Lidoderm patches.  Normal kidney function on 1/23. She is agreeable to this plan and will follow-up with the orthopedic provider I sent a referral to.   Final Clinical Impression(s) / ED Diagnoses Final diagnoses:  Right hip pain    Rx / DC Orders Results and diagnoses were explained to the patient. Return precautions discussed in full. Patient had no additional questions and expressed complete understanding.   This chart was dictated using voice recognition software.  Despite best efforts to proofread,  errors can occur which can change the documentation meaning.      Darliss Ridgel 09/01/21 1142    Blanchie Dessert, MD 09/01/21 2129

## 2021-09-01 NOTE — Discharge Instructions (Addendum)
Your x-ray is normal today.  Please follow-up with the orthopedic doctor attached to these discharge papers.  I have sent naproxen for you to take in the morning and at nighttime.  In the middle, you may use Tylenol.  Lidocaine patches may help your discomfort as well.  Do not wear any patch for longer than 12 hours at the time, you must have 12 hours patch free.

## 2021-09-01 NOTE — ED Triage Notes (Signed)
Pt. Stated, I started having rt. Hip pain about a week ago

## 2021-09-20 ENCOUNTER — Ambulatory Visit: Payer: Medicare HMO | Admitting: Internal Medicine

## 2021-09-21 NOTE — Progress Notes (Signed)
Office Visit Note  Patient: Hannah Huerta             Date of Birth: 02/17/1961           MRN: [id number]             PCP: Vevelyn Francois, NP Referring: Vevelyn Francois, NP Visit Date: 09/22/2021   Subjective:  New Patient (Initial Visit) (Abnormal labs)   History of Present Illness: Hannah Huerta is a 61 y.o. female here for evaluation for possible rheumatoid arthritis. Her history includes T2DM, migraine, CAD, CHF, and chronic low back pain with DJD and right sided sciatica. She has joint pain in multiple areas treatments including acetaminophen, topical diclofenac, gabapentin, naproxen although avoiding long term NSAIDs due to renal function.  He has had trouble due to right sided foot drop ongoing since about the past 4 years.  This is thought to be from residual damage due to significant lumbar spinal stenosis now status post surgical fusion.  Swelling and pain in this foot is worse in the past 1 year.  She is also having trouble with bilateral hands with pain and swelling also reports hand numbness that is worse at night mornings and when driving long distances.  She had previous evaluation with the symptoms in 2020 showing positive rheumatoid factor and CCP antibodies. She was never definitively diagnosed or on any disease specific treatments for RA.  Labs reviewed 08/2018 RF 57.4 CCP 168 ANA neg  09/01/21 Xray right hip and pelvis IMPRESSION: No acute osseous abnormality identified.  Activities of Daily Living:  Patient reports morning stiffness for 4.5 hours.   Patient Reports nocturnal pain.  Difficulty dressing/grooming: Denies Difficulty climbing stairs: Reports Difficulty getting out of chair: Reports Difficulty using hands for taps, buttons, cutlery, and/or writing: Denies  Review of Systems  Constitutional:  Negative for fatigue.  HENT:  Negative for mouth dryness.   Eyes:  Negative for dryness.  Respiratory:  Negative for shortness of breath.   Cardiovascular:   Positive for swelling in legs/feet.  Gastrointestinal:  Positive for diarrhea.  Endocrine: Positive for heat intolerance.  Genitourinary:  Negative for difficulty urinating.  Musculoskeletal:  Positive for joint pain, gait problem, joint pain, joint swelling, muscle weakness, morning stiffness and muscle tenderness.  Skin:  Negative for rash.  Allergic/Immunologic: Negative for susceptible to infections.  Neurological:  Positive for numbness.  Hematological:  Negative for bruising/bleeding tendency.  Psychiatric/Behavioral:  Negative for sleep disturbance.    PMFS History:  Patient Active Problem List   Diagnosis Date Noted   Bilateral carpal tunnel syndrome 09/22/2021   Rheumatoid factor positive 09/22/2021   Bilateral hand swelling 09/22/2021   Spondylolisthesis 05/06/2021   Uncontrolled type 2 diabetes mellitus with microalbuminuria 03/28/2021   Foot-drop 01/04/2021   Status post lumbar spinal fusion 02/12/2020   Spondylolisthesis of lumbar region 02/05/2020   Arthropathy of lumbar facet joint 12/11/2019   Chronic right-sided low back pain with right-sided sciatica 11/04/2019   Other chronic pain 11/04/2019   DDD (degenerative disc disease), lumbar 11/04/2019   Essential (primary) hypertension 11/04/2019   Body mass index (BMI) 50.0-59.9, adult ([place]) 11/04/2019   Spinal stenosis of lumbar region 11/04/2019   Spondylolisthesis, lumbar region 11/04/2019   Vitamin D deficiency 09/10/2019   CHF (congestive heart failure) ([place]) 01/30/2018   OSA (obstructive sleep apnea) 03/01/2017   ARF (acute renal failure) ([place]) 02/28/2017   Hyponatremia 02/28/2017   Syncope 07/23/2016   Syncope and collapse 07/23/2016   S/P  cardiac catheterization, non obstructive disease 06/04/15 06/05/2015   CAD in native artery 06/05/2015   Fever 06/05/2015   Hypokalemia 01/75/1025   Metabolic syndrome 85/27/7824   Chest pain, neg MI, possible GI 06/04/2015   Colon cancer screening 08/05/2014   Severe  obesity (BMI >= 40) ([place]) 08/05/2014   Health care maintenance 05/13/2014   Prediabetes 05/13/2014   Chronic migraine 04/27/2014   Atypical chest pain 04/27/2014   SOB (shortness of breath) 09/16/2013   Palpitations 09/16/2013   HTN (hypertension) 03/09/2012   Hyperlipidemia 03/09/2012   Microcytic anemia    Morbid obesity with BMI of 50.0-59.9, adult ([place])    Precordial chest pain 03/06/2012   Dizziness 03/06/2012    Past Medical History:  Diagnosis Date   Abnormal Pap smear of cervix    pt couldnt state what type of abnormality   Anemia    Arthritis    CAD in native artery 06/05/2015   Carpal tunnel syndrome, bilateral    Chest pain    a. 02/2012 abnl myoview - inferoapical reverisbility concerning for ischemia, EF 59%;   02/2012 nonobstructive cath   CHF (congestive heart failure) (HCC)    Chronic migraine    Diabetes mellitus without complication (HCC)    GERD (gastroesophageal reflux disease)    HLD (hyperlipidemia)    Hypertension    Hypokalemia 23/53/6144   Metabolic syndrome 31/54/0086   Morbid obesity with BMI of 50.0-59.9, adult ([place])    Prediabetes    S/P cardiac catheterization, non obstructive disease 06/04/15 06/05/2015   Sickle cell trait (HCC)    Spondylolisthesis of lumbar region    Syncope    Wears glasses     Family History  Problem Relation Age of Onset   Coronary artery disease Father 57   Heart disease Father    Other Father        died in a MVA   Hypertension Father    Sudden death Mother 58       Questionable MI   Diabetes Mother    Hypertension Mother    Hypertension Sister    Pancreatic cancer Maternal Aunt    Breast cancer Maternal Aunt        after age 54   Stroke Paternal Grandmother    Colon polyps Sister    Colon cancer Neg Hx    Gallbladder disease Neg Hx    Esophageal cancer Neg Hx    Past Surgical History:  Procedure Laterality Date   BACK SURGERY     x1 lumbar fusion   CARDIAC CATHETERIZATION N/A 06/04/2015    Procedure: Left Heart Cath and Coronary Angiography;  Surgeon: [name], MD;  Location: [facility];  Service: Cardiovascular;  Laterality: N/A;   CHOLECYSTECTOMY     laparoscopic   COLONOSCOPY WITH PROPOFOL N/A 09/04/2014   Procedure: COLONOSCOPY WITH PROPOFOL;  Surgeon: [name], MD;  Location: [facility];  Service: Endoscopy;  Laterality: N/A;   LEFT HEART CATHETERIZATION WITH CORONARY ANGIOGRAM N/A 03/08/2012   Procedure: LEFT HEART CATHETERIZATION WITH CORONARY ANGIOGRAM;  Surgeon: Hillary Bow, MD;  Location: [facility];  Service: Cardiovascular;  Laterality: N/A;   SPINE SURGERY     fusion   TUBAL LIGATION     Social History   Social History Narrative   Lives at home alone.   Immunization History  Administered Date(s) Administered   Influenza,inj,Quad PF,6+ Mos 04/28/2014, 07/25/2016   Janssen (J&J) SARS-COV-2 Vaccination 11/03/2019   Pneumococcal Polysaccharide-23 04/28/2014, 03/01/2017  Tdap 09/15/2012     Objective: Vital Signs: BP (!) 172/93 (BP Location: Right Arm, Patient Position: Sitting, Cuff Size: Normal)    Pulse 69    Resp 16    Ht 5' 1.5" (1.562 m)    Wt 267 lb (121.1 kg)    LMP 09/15/2013    BMI 49.63 kg/m    Physical Exam Constitutional:      Appearance: She is obese.  HENT:     Mouth/Throat:     Mouth: Mucous membranes are moist.     Pharynx: Oropharynx is clear.  Eyes:     Conjunctiva/sclera: Conjunctivae normal.  Cardiovascular:     Rate and Rhythm: Normal rate and regular rhythm.  Pulmonary:     Effort: Pulmonary effort is normal.     Breath sounds: Normal breath sounds.  Musculoskeletal:     Right lower leg: No edema.     Left lower leg: No edema.  Skin:    General: Skin is warm and dry.     Findings: No rash.  Neurological:     Mental Status: She is alert.  Psychiatric:        Mood and Affect: Mood normal.     Musculoskeletal Exam:  Neck full ROM no tenderness Shoulders full ROM no tenderness or  swelling Elbows full ROM no tenderness or swelling Wrists positive tinel's and phalen's, limited ultrasound inspection with median nerve CSA 30 mm^2 on right 28 mm^2 on left Fingers full ROM no tenderness or swelling Knees with crepitus no palpable effusions Right foot no active dorsiflexion strength, pes planus, mildly decreased passive ROM   Investigation: No additional findings.  Imaging: DG Hip Unilat W or Wo Pelvis 2-3 Views Right  Result Date: 09/01/2021 CLINICAL DATA:  Right hip pain EXAM: DG HIP (WITH OR WITHOUT PELVIS) 2-3V RIGHT COMPARISON:  None. FINDINGS: There is no evidence of hip fracture or dislocation. There is no evidence of significant arthropathy or other focal bone abnormality. IMPRESSION: No acute osseous abnormality identified. Electronically Signed   By: Ofilia Neas M.D.   On: 09/01/2021 10:34    Recent Labs: Lab Results  Component Value Date   WBC 4.4 10/27/2020   HGB 11.3 10/27/2020   PLT 305 10/27/2020   NA 144 08/08/2021   K 4.1 08/08/2021   CL 104 08/08/2021   CO2 28 02/06/2020   GLUCOSE 100 (H) 08/08/2021   BUN 12 08/08/2021   CREATININE 0.88 08/08/2021   BILITOT 0.3 08/08/2021   ALKPHOS 101 08/08/2021   AST 19 08/08/2021   ALT 13 02/05/2019   PROT 7.8 08/08/2021   ALBUMIN 4.4 08/08/2021   CALCIUM 9.5 08/08/2021   GFRAA 77 03/10/2020    Speciality Comments: No specialty comments available.  Procedures:  No procedures performed Allergies: Patient has no known allergies.   Assessment / Plan:     Visit Diagnoses: Bilateral hand swelling  Rheumatoid factor positive - Plan: Sedimentation rate, C-reactive protein, Rheumatoid factor, 14-3-3 eta Protein  Reported hand swelling and pain on exam today only swelling is localized to the wrist looks extremely suggestive for bilateral carpal tunnel syndrome. This can be seen secondary to RA but not other definitive changes. Checking sed rate and CRP and 14-3-3 eta for markers of inflammation  activity. Repeating rheumatoid factor test was previously moderately high.  Right foot drop  4 years of symptoms limited success of PT treatment we discussed nerve recovery may or may not improve much more. She might benefit with ankle foot orthotic  for this problem to improve gait and possible foot pain.  Bilateral carpal tunnel syndrome  No definitive treatments for this previously but history and exam are highly consistent. I recommend trial of wrist brace night time use and if not improving much to follow up for bilateral wrist steroid injection.  Orders: Orders Placed This Encounter  Procedures   Sedimentation rate   C-reactive protein   Rheumatoid factor   14-3-3 eta Protein   No orders of the defined types were placed in this encounter.   Follow-Up Instructions: Return in about 6 weeks (around 11/03/2021) for New pt CTS/?RA f/u 6-8 wks.   Collier Salina, MD  Note - This record has been created using [employer].  Chart creation errors have been sought, but may not always  have been located. Such creation errors do not reflect on  the standard of medical care.

## 2021-09-22 ENCOUNTER — Ambulatory Visit (INDEPENDENT_AMBULATORY_CARE_PROVIDER_SITE_OTHER): Payer: Medicare HMO | Admitting: Internal Medicine

## 2021-09-22 ENCOUNTER — Other Ambulatory Visit: Payer: Self-pay

## 2021-09-22 ENCOUNTER — Encounter: Payer: Self-pay | Admitting: Internal Medicine

## 2021-09-22 VITALS — BP 172/93 | HR 69 | Resp 16 | Ht 61.5 in | Wt 267.0 lb

## 2021-09-22 DIAGNOSIS — M7989 Other specified soft tissue disorders: Secondary | ICD-10-CM

## 2021-09-22 DIAGNOSIS — R768 Other specified abnormal immunological findings in serum: Secondary | ICD-10-CM

## 2021-09-22 DIAGNOSIS — G5603 Carpal tunnel syndrome, bilateral upper limbs: Secondary | ICD-10-CM | POA: Diagnosis not present

## 2021-09-22 DIAGNOSIS — M21371 Foot drop, right foot: Secondary | ICD-10-CM

## 2021-09-22 NOTE — Patient Instructions (Signed)
I recommend trying to use a wrist brace for carpal tunnel syndrome at night time to decrease pressure on the nerve in your wrists. ? ?Rheumatoid arthritis can be a cause of carpal tunnel syndrome but I don't see any other obvious signs of joint inflammation right now. I am checking some blood tests for markers of RA. ?

## 2021-09-23 ENCOUNTER — Telehealth: Payer: Self-pay | Admitting: Internal Medicine

## 2021-09-23 NOTE — Telephone Encounter (Signed)
Patient left a voicemail stating she received her lab results yesterday but didn't understand what they meant. Patient would like a call back with some clarification on whether or not she has RA.  ?

## 2021-09-26 NOTE — Telephone Encounter (Signed)
Attempted to contact the patient and left message for patient to call the office.  

## 2021-09-26 NOTE — Telephone Encounter (Signed)
Patient advised tests are positive but lower than she had in 2020, and Dr. Benjamine Mola did not see any signs on examination during our visit so would not say for sure she has RA. Sometimes RA is not always active. ?She definitely has signs of carpal tunnel and Dr. Benjamine Mola would like to try treating this first before saying she needs to take any long term medications. ?

## 2021-09-26 NOTE — Telephone Encounter (Signed)
Tests are positive but lower than she had in 2020, and I did not see any signs on examination during our visit so would not say for sure she has RA. Sometimes RA is not always active. ?She definitely has signs of carpal tunnel and I would like to try treating this first before saying she needs to take any long term medications.

## 2021-09-30 LAB — RHEUMATOID FACTOR: Rheumatoid fact SerPl-aCnc: 21 IU/mL — ABNORMAL HIGH (ref <14)

## 2021-09-30 LAB — SEDIMENTATION RATE: Sed Rate: 34 mm/h — ABNORMAL HIGH (ref 0–30)

## 2021-09-30 LAB — C-REACTIVE PROTEIN: CRP: 4.3 mg/L (ref <8.0)

## 2021-09-30 LAB — 14-3-3 ETA PROTEIN: 14-3-3 eta Protein: <0.2 ng/mL (ref <0.2)

## 2021-10-03 ENCOUNTER — Other Ambulatory Visit: Payer: Self-pay

## 2021-10-03 ENCOUNTER — Encounter: Payer: Self-pay | Admitting: Nurse Practitioner

## 2021-10-03 ENCOUNTER — Ambulatory Visit (INDEPENDENT_AMBULATORY_CARE_PROVIDER_SITE_OTHER): Payer: Medicare HMO | Admitting: Nurse Practitioner

## 2021-10-03 VITALS — BP 158/85 | HR 74 | Temp 98.6°F | Ht 61.0 in | Wt 261.6 lb

## 2021-10-03 DIAGNOSIS — S02839A Fracture of medial orbital wall, unspecified side, initial encounter for closed fracture: Secondary | ICD-10-CM | POA: Insufficient documentation

## 2021-10-03 DIAGNOSIS — I10 Essential (primary) hypertension: Secondary | ICD-10-CM | POA: Diagnosis not present

## 2021-10-03 DIAGNOSIS — E119 Type 2 diabetes mellitus without complications: Secondary | ICD-10-CM

## 2021-10-03 DIAGNOSIS — M792 Neuralgia and neuritis, unspecified: Secondary | ICD-10-CM

## 2021-10-03 DIAGNOSIS — E7849 Other hyperlipidemia: Secondary | ICD-10-CM

## 2021-10-03 DIAGNOSIS — G4733 Obstructive sleep apnea (adult) (pediatric): Secondary | ICD-10-CM

## 2021-10-03 LAB — POCT GLYCOSYLATED HEMOGLOBIN (HGB A1C)
HbA1c POC (<> result, manual entry): 6.4 % (ref 4.0–5.6)
HbA1c, POC (controlled diabetic range): 6.4 % (ref 0.0–7.0)
HbA1c, POC (prediabetic range): 6.4 % (ref 5.7–6.4)
Hemoglobin A1C: 6.4 % — AB (ref 4.0–5.6)

## 2021-10-03 NOTE — Patient Instructions (Addendum)
Managing Your Hypertension ?Hypertension, also called high blood pressure, is when the force of the blood pressing against the walls of the arteries is too strong. Arteries are blood vessels that carry blood from your heart throughout your body. Hypertension forces the heart to work harder to pump blood and may cause the arteries to become narrow or stiff. ?Understanding blood pressure readings ?Your personal target blood pressure may vary depending on your medical conditions, your age, and other factors. A blood pressure reading includes a higher number over a lower number. Ideally, your blood pressure should be below 120/80. You should know that: ?The first, or top, number is called the systolic pressure. It is a measure of the pressure in your arteries as your heart beats. ?The second, or bottom number, is called the diastolic pressure. It is a measure of the pressure in your arteries as the heart relaxes. ?Blood pressure is classified into four stages. Based on your blood pressure reading, your health care provider may use the following stages to determine what type of treatment you need, if any. Systolic pressure and diastolic pressure are measured in a unit called mmHg. ?Normal ?Systolic pressure: below 914. ?Diastolic pressure: below 80. ?Elevated ?Systolic pressure: 782-956. ?Diastolic pressure: below 80. ?Hypertension stage 1 ?Systolic pressure: 213-086. ?Diastolic pressure: 57-84. ?Hypertension stage 2 ?Systolic pressure: 696 or above. ?Diastolic pressure: 90 or above. ?How can this condition affect me? ?Managing your hypertension is an important responsibility. Over time, hypertension can damage the arteries and decrease blood flow to important parts of the body, including the brain, heart, and kidneys. Having untreated or uncontrolled hypertension can lead to: ?A heart attack. ?A stroke. ?A weakened blood vessel (aneurysm). ?Heart failure. ?Kidney damage. ?Eye damage. ?Metabolic syndrome. ?Memory and  concentration problems. ?Vascular dementia. ?What actions can I take to manage this condition? ?Hypertension can be managed by making lifestyle changes and possibly by taking medicines. Your health care provider will help you make a plan to bring your blood pressure within a normal range. ?Nutrition ? ?Eat a diet that is high in fiber and potassium, and low in salt (sodium), added sugar, and fat. An example eating plan is called the Dietary Approaches to Stop Hypertension (DASH) diet. To eat this way: ?Eat plenty of fresh fruits and vegetables. Try to fill one-half of your plate at each meal with fruits and vegetables. ?Eat whole grains, such as whole-wheat pasta, brown rice, or whole-grain bread. Fill about one-fourth of your plate with whole grains. ?Eat low-fat dairy products. ?Avoid fatty cuts of meat, processed or cured meats, and poultry with skin. Fill about one-fourth of your plate with lean proteins such as fish, chicken without skin, beans, eggs, and tofu. ?Avoid pre-made and processed foods. These tend to be higher in sodium, added sugar, and fat. ?Reduce your daily sodium intake. Most people with hypertension should eat less than 1,500 mg of sodium a day. ?Lifestyle ? ?Work with your health care provider to maintain a healthy body weight or to lose weight. Ask what an ideal weight is for you. ?Get at least 30 minutes of exercise that causes your heart to beat faster (aerobic exercise) most days of the week. Activities may include walking, swimming, or biking. ?Include exercise to strengthen your muscles (resistance exercise), such as weight lifting, as part of your weekly exercise routine. Try to do these types of exercises for 30 minutes at least 3 days a week. ?Do not use any products that contain nicotine or tobacco, such as cigarettes, e-cigarettes,  and chewing tobacco. If you need help quitting, ask your health care provider. ?Control any long-term (chronic) conditions you have, such as high  cholesterol or diabetes. ?Identify your sources of stress and find ways to manage stress. This may include meditation, deep breathing, or making time for fun activities. ?Alcohol use ?Do not drink alcohol if: ?Your health care provider tells you not to drink. ?You are pregnant, may be pregnant, or are planning to become pregnant. ?If you drink alcohol: ?Limit how much you use to: ?0-1 drink a day for women. ?0-2 drinks a day for men. ?Be aware of how much alcohol is in your drink. In the [place] one drink equals one 12 oz bottle of beer (355 mL), one 5 oz glass of wine (148 mL), or one 1? oz glass of hard liquor (44 mL). ?Medicines ?Your health care provider may prescribe medicine if lifestyle changes are not enough to get your blood pressure under control and if: ?Your systolic blood pressure is 130 or higher. ?Your diastolic blood pressure is 80 or higher. ?Take medicines only as told by your health care provider. Follow the directions carefully. Blood pressure medicines must be taken as told by your health care provider. The medicine does not work as well when you skip doses. Skipping doses also puts you at risk for problems. ?Monitoring ?Before you monitor your blood pressure: ?Do not smoke, drink caffeinated beverages, or exercise within 30 minutes before taking a measurement. ?Use the bathroom and empty your bladder (urinate). ?Sit quietly for at least 5 minutes before taking measurements. ?Monitor your blood pressure at home as told by your health care provider. To do this: ?Sit with your back straight and supported. ?Place your feet flat on the floor. Do not cross your legs. ?Support your arm on a flat surface, such as a table. Make sure your upper arm is at heart level. ?Each time you measure, take two or three readings one minute apart and record the results. ?You may also need to have your blood pressure checked regularly by your health care provider. ?General information ?Talk with your health care  provider about your diet, exercise habits, and other lifestyle factors that may be contributing to hypertension. ?Review all the medicines you take with your health care provider because there may be side effects or interactions. ?Keep all visits as told by your health care provider. Your health care provider can help you create and adjust your plan for managing your high blood pressure. ?Where to find more information ?National Heart, Lung, and Blood Institute: https://wilson-eaton.com/ ?American Heart Association: www.heart.org ?Contact a health care provider if: ?You think you are having a reaction to medicines you have taken. ?You have repeated (recurrent) headaches. ?You feel dizzy. ?You have swelling in your ankles. ?You have trouble with your vision. ?Get help right away if: ?You develop a severe headache or confusion. ?You have unusual weakness or numbness, or you feel faint. ?You have severe pain in your chest or abdomen. ?You vomit repeatedly. ?You have trouble breathing. ?These symptoms may represent a serious problem that is an emergency. Do not wait to see if the symptoms will go away. Get medical help right away. Call your local emergency services (911 in the [place] Do not drive yourself to the hospital. ?Summary ?Hypertension is when the force of blood pumping through your arteries is too strong. If this condition is not controlled, it may put you at risk for serious complications. ?Your personal target blood pressure may vary depending on  your medical conditions, your age, and other factors. For most people, a normal blood pressure is less than 120/80. ?Hypertension is managed by lifestyle changes, medicines, or both. ?Lifestyle changes to help manage hypertension include losing weight, eating a healthy, low-sodium diet, exercising more, stopping smoking, and limiting alcohol. ?This information is not intended to replace advice given to you by your health care provider. Make sure you discuss any questions  you have with your health care provider. ?Document Revised: 07/21/2019 Document Reviewed: 06/03/2019 ?Elsevier Patient Education ? 2022 Elsevier Inc. ?Diabetes Mellitus and Nutrition, Adult ?When you have diabetes, or d

## 2021-10-03 NOTE — Progress Notes (Signed)
? ?[place] ?[address], [place]  [address] ?Phone:  [phone]   Fax:  [phone] ? ? ?Established Patient Office Visit ? ?Subjective:  ?Patient ID: Hannah Huerta, female    DOB: 03-04-1961  Age: 61 y.o. MRN: [id number] ? ?CC:  ?Chief Complaint  ?Patient presents with  ? Follow-up  ?  Patient is here today for her 3 month follow up visit with no concerns or issues to discuss.  ? ? ?HPI ?Hannah Huerta presents for follow up. She  has a past medical history of Abnormal Pap smear of cervix, Anemia, Arthritis, CAD in native artery (06/05/2015), Carpal tunnel syndrome, bilateral, Chest pain, CHF (congestive heart failure) ([place]), Chronic migraine, Diabetes mellitus without complication ([place]), GERD (gastroesophageal reflux disease), HLD (hyperlipidemia), Hypertension, Hypokalemia (15/83/0940), Metabolic syndrome (76/80/8811), Morbid obesity with BMI of 50.0-59.9, adult ([place]), Prediabetes, S/P cardiac catheterization, non obstructive disease 06/04/15 (06/05/2015), Sickle cell trait ([place]), Spondylolisthesis of lumbar region, Syncope, and Wears glasses.  ? ?Ms. Hannah Huerta is in today for diabetes follow up. The prescribed treatment is metformin 500 mg BID along with statin therapy atorvastatin 40 mg . The reported use of treatment  consistent with prescribed. There are no reported side effects from the treatment. Home glucose monitoring indicates a CBG . Denies fever, chills, headache, dizziness, visual changes, polydipsia,  polyphagia shortness of breath, dyspnea on exertion, chest pain, abdominal pain, nausea, vomiting, polyuria, constipation, diarrhea,  any edema, numbness, tingling, burning of hand or feet.  ? ?She reports that she some teeth extracted and this has curbed her appetite.  ? ?She is CTS and has been treated to rheumatology.  ? ? ?Past Medical History:  ?Diagnosis Date  ? Abnormal Pap smear of cervix   ? pt couldnt state what type of abnormality  ? Anemia   ? Arthritis   ? CAD  in native artery 06/05/2015  ? Carpal tunnel syndrome, bilateral   ? Chest pain   ? a. 02/2012 abnl myoview - inferoapical reverisbility concerning for ischemia, EF 59%;   02/2012 nonobstructive cath  ? CHF (congestive heart failure) ([place])   ? Chronic migraine   ? Diabetes mellitus without complication ([place])   ? GERD (gastroesophageal reflux disease)   ? HLD (hyperlipidemia)   ? Hypertension   ? Hypokalemia 06/05/2015  ? Metabolic syndrome 09/28/9456  ? Morbid obesity with BMI of 50.0-59.9, adult ([place])   ? Prediabetes   ? S/P cardiac catheterization, non obstructive disease 06/04/15 06/05/2015  ? Sickle cell trait ([place])   ? Spondylolisthesis of lumbar region   ? Syncope   ? Wears glasses   ? ? ?Past Surgical History:  ?Procedure Laterality Date  ? BACK SURGERY    ? x1 lumbar fusion  ? CARDIAC CATHETERIZATION N/A 06/04/2015  ? Procedure: Left Heart Cath and Coronary Angiography;  Surgeon: [name], MD;  Location: [facility];  Service: Cardiovascular;  Laterality: N/A;  ? CHOLECYSTECTOMY    ? laparoscopic  ? COLONOSCOPY WITH PROPOFOL N/A 09/04/2014  ? Procedure: COLONOSCOPY WITH PROPOFOL;  Surgeon: [name], MD;  Location: [facility];  Service: Endoscopy;  Laterality: N/A;  ? LEFT HEART CATHETERIZATION WITH CORONARY ANGIOGRAM N/A 03/08/2012  ? Procedure: LEFT HEART CATHETERIZATION WITH CORONARY ANGIOGRAM;  Surgeon: Hillary Bow, MD;  Location: [facility];  Service: Cardiovascular;  Laterality: N/A;  ? SPINE SURGERY    ? fusion  ? TUBAL LIGATION    ? ? ?Family History  ?  Problem Relation Age of Onset  ? Coronary artery disease Father 80  ? Heart disease Father   ? Other Father   ?     died in a MVA  ? Hypertension Father   ? Sudden death Mother 12  ?     Questionable MI  ? Diabetes Mother   ? Hypertension Mother   ? Hypertension Sister   ? Pancreatic cancer Maternal Aunt   ? Breast cancer Maternal Aunt   ?     after age 32  ? Stroke Paternal Grandmother   ? Colon polyps Sister   ? Colon cancer Neg  Hx   ? Gallbladder disease Neg Hx   ? Esophageal cancer Neg Hx   ? ? ?Social History  ? ?Socioeconomic History  ? Marital status: Widowed  ?  Spouse name: Not on file  ? Number of children: 3  ? Years of education: Not on file  ? Highest education level: Not on file  ?Occupational History  ? Occupation: Works in [employer]  ?  Employer: [id number]  ?Tobacco Use  ? Smoking status: Never  ? Smokeless tobacco: Never  ?Vaping Use  ? Vaping Use: Never used  ?Substance and Sexual Activity  ? Alcohol use: No  ? Drug use: No  ? Sexual activity: Yes  ?  Birth control/protection: Surgical  ?Other Topics Concern  ? Not on file  ?Social History Narrative  ? Lives at home alone.  ? ?Social Determinants of Health  ? ?Financial Resource Strain: Not on file  ?Food Insecurity: Not on file  ?Transportation Needs: Not on file  ?Physical Activity: Not on file  ?Stress: Not on file  ?Social Connections: Not on file  ?Intimate Partner Violence: Not on file  ? ? ?Outpatient Medications Prior to Visit  ?Medication Sig Dispense Refill  ? amLODipine (NORVASC) 5 MG tablet Take 1 tablet (5 mg total) by mouth daily. 90 tablet 3  ? aspirin EC 81 MG tablet Take 1 tablet (81 mg total) by mouth daily. 30 tablet 11  ? atorvastatin (LIPITOR) 40 MG tablet Take 1 tablet (40 mg total) by mouth daily. 90 tablet 1  ? carvedilol (COREG) 6.25 MG tablet Take 1 tablet (6.25 mg total) by mouth 2 (two) times daily with a meal. TAKE 1 TABLET BY MOUTH 2 TIMES DAILY WITH A MEAL. 180 tablet 3  ? gabapentin (NEURONTIN) 100 MG capsule Take 1 capsule (100 mg total) by mouth at bedtime for 180 days, THEN 2 capsules (200 mg total) at bedtime. 60 capsule 3  ? metFORMIN (GLUCOPHAGE) 500 MG tablet Take 1 tablet (500 mg total) by mouth 2 (two) times daily with a meal. 90 tablet 3  ? naproxen (NAPROSYN) 375 MG tablet Take 1 tablet (375 mg total) by mouth 2 (two) times daily. 20 tablet 0  ? spironolactone (ALDACTONE) 25 MG tablet Take 1 tablet (25 mg total) by mouth daily. 90  tablet 3  ? lidocaine (LIDODERM) 5 % Place 1 patch onto the skin daily. Remove & Discard patch within 12 hours or as directed by MD (Patient not taking: Reported on 09/22/2021) 30 patch 0  ? ?No facility-administered medications prior to visit.  ? ? ?No Known Allergies ? ?ROS ?Review of Systems ? ?  ?Objective:  ?  ?Physical Exam ?Constitutional:   ?   Appearance: She is obese.  ?HENT:  ?   Head: Normocephalic and atraumatic.  ?Musculoskeletal:     ?   General: Normal range of motion.  ?  Cervical back: Normal range of motion.  ?Skin: ?   General: Skin is warm and dry.  ?   Capillary Refill: Capillary refill takes less than 2 seconds.  ?Neurological:  ?   General: No focal deficit present.  ?   Mental Status: She is alert and oriented to person, place, and time.  ?Psychiatric:     ?   Mood and Affect: Mood normal.     ?   Behavior: Behavior normal.     ?   Thought Content: Thought content normal.     ?   Judgment: Judgment normal.  ? ? ?BP (!) 158/85   Pulse 74   Temp 98.6 ?F (37 ?C)   Ht 5' 1" (1.549 m)   Wt 261 lb 9.6 oz (118.7 kg)   LMP 09/15/2013   SpO2 99%   BMI 49.43 kg/m?  ?Wt Readings from Last 3 Encounters:  ?10/03/21 261 lb 9.6 oz (118.7 kg)  ?09/22/21 267 lb (121.1 kg)  ?08/08/21 266 lb 9.6 oz (120.9 kg)  ? ? ? ?There are no preventive care reminders to display for this patient. ? ? ?There are no preventive care reminders to display for this patient. ? ?Lab Results  ?Component Value Date  ? TSH 1.570 09/08/2019  ? ?Lab Results  ?Component Value Date  ? WBC 4.4 10/27/2020  ? HGB 11.3 10/27/2020  ? HCT 35.4 10/27/2020  ? MCV 79 10/27/2020  ? PLT 305 10/27/2020  ? ?Lab Results  ?Component Value Date  ? NA 144 08/08/2021  ? K 4.1 08/08/2021  ? CO2 28 02/06/2020  ? GLUCOSE 100 (H) 08/08/2021  ? BUN 12 08/08/2021  ? CREATININE 0.88 08/08/2021  ? BILITOT 0.3 08/08/2021  ? ALKPHOS 101 08/08/2021  ? AST 19 08/08/2021  ? ALT 13 02/05/2019  ? PROT 7.8 08/08/2021  ? ALBUMIN 4.4 08/08/2021  ? CALCIUM 9.5  08/08/2021  ? ANIONGAP 10 02/06/2020  ? EGFR 75 08/08/2021  ? ?Lab Results  ?Component Value Date  ? CHOL 137 08/08/2021  ? ?Lab Results  ?Component Value Date  ? HDL 54 08/08/2021  ? ?Lab Results  ?Component Value

## 2021-10-18 ENCOUNTER — Ambulatory Visit: Payer: Medicare HMO | Admitting: Internal Medicine

## 2021-11-07 ENCOUNTER — Ambulatory Visit: Payer: Medicare HMO | Admitting: Nurse Practitioner

## 2021-11-14 NOTE — Progress Notes (Signed)
? ?Office Visit Note ? ?Patient: Hannah Huerta             ?Date of Birth: [birth year]           ?MRN: [id number]             ?PCP: Teena Dunk, NP ?Referring: Vevelyn Francois, NP ?Visit Date: 11/15/2021 ? ? ?Subjective:  ?Arthritis (Bil wrist pain and swelling) ? ? ?History of Present Illness: Hannah Huerta is a 61 y.o. female here for follow up for bilateral hand pains consistent with CTS and possible RA. She continues to have similar amount of wrist pain and swelling since our last visit. No appreciable benefit with conservative treatment of stretching or bracing wrist in neutral position. ? ?Previous HPI ?09/22/21 ?Hannah Huerta is a 61 y.o. female here for evaluation for possible rheumatoid arthritis. Her history includes T2DM, migraine, CAD, CHF, and chronic low back pain with DJD and right sided sciatica. She has joint pain in multiple areas treatments including acetaminophen, topical diclofenac, gabapentin, naproxen although avoiding long term NSAIDs due to renal function.  He has had trouble due to right sided foot drop ongoing since about the past 4 years.  This is thought to be from residual damage due to significant lumbar spinal stenosis now status post surgical fusion.  Swelling and pain in this foot is worse in the past 1 year.  She is also having trouble with bilateral hands with pain and swelling also reports hand numbness that is worse at night mornings and when driving long distances.  She had previous evaluation with the symptoms in 2020 showing positive rheumatoid factor and CCP antibodies. She was never definitively diagnosed or on any disease specific treatments for RA. ?  ?Labs reviewed ?08/2018 ?RF 57.4 ?CCP 168 ?ANA neg ? ? ?Review of Systems  ?Constitutional:  Negative for fatigue.  ?HENT:  Negative for mouth dryness.   ?Eyes:  Negative for dryness.  ?Respiratory:  Negative for shortness of breath.   ?Cardiovascular:  Negative for swelling in legs/feet.  ?Gastrointestinal:  Negative for  constipation.  ?Endocrine: Positive for heat intolerance.  ?Genitourinary:  Negative for difficulty urinating.  ?Musculoskeletal:  Positive for joint pain, gait problem, joint pain, joint swelling and morning stiffness.  ?Skin:  Negative for rash.  ?Allergic/Immunologic: Negative for susceptible to infections.  ?Neurological:  Positive for numbness.  ?Hematological:  Negative for bruising/bleeding tendency.  ?Psychiatric/Behavioral:  Negative for sleep disturbance.   ? ?PMFS History:  ?Patient Active Problem List  ? Diagnosis Date Noted  ? Medial orbital wall fracture, closed, initial encounter ([place]) 10/03/2021  ? Bilateral carpal tunnel syndrome 09/22/2021  ? Rheumatoid factor positive 09/22/2021  ? Bilateral hand swelling 09/22/2021  ? Spondylolisthesis 05/06/2021  ? Uncontrolled type 2 diabetes mellitus with microalbuminuria 03/28/2021  ? Foot-drop 01/04/2021  ? Status post lumbar spinal fusion 02/12/2020  ? Spondylolisthesis of lumbar region 02/05/2020  ? Arthropathy of lumbar facet joint 12/11/2019  ? Chronic right-sided low back pain with right-sided sciatica 11/04/2019  ? Other chronic pain 11/04/2019  ? DDD (degenerative disc disease), lumbar 11/04/2019  ? Essential (primary) hypertension 11/04/2019  ? Body mass index (BMI) 50.0-59.9, adult ([place]) 11/04/2019  ? Spinal stenosis of lumbar region 11/04/2019  ? Spondylolisthesis, lumbar region 11/04/2019  ? Vitamin D deficiency 09/10/2019  ? CHF (congestive heart failure) ([place]) 01/30/2018  ? OSA (obstructive sleep apnea) 03/01/2017  ? ARF (acute renal failure) ([place]) 02/28/2017  ? Hyponatremia 02/28/2017  ? Syncope 07/23/2016  ?  Syncope and collapse 07/23/2016  ? S/P cardiac catheterization, non obstructive disease 06/04/15 06/05/2015  ? CAD in native artery 06/05/2015  ? Fever 06/05/2015  ? Hypokalemia 06/05/2015  ? Metabolic syndrome 25/95/6387  ? Chest pain, neg MI, possible GI 06/04/2015  ? Colon cancer screening 08/05/2014  ? Severe obesity (BMI >= 40) ([place])  08/05/2014  ? Health care maintenance 05/13/2014  ? Prediabetes 05/13/2014  ? Chronic migraine 04/27/2014  ? Atypical chest pain 04/27/2014  ? SOB (shortness of breath) 09/16/2013  ? Palpitations 09/16/2013  ? HTN (hypertension) 03/09/2012  ? Hyperlipidemia 03/09/2012  ? Microcytic anemia   ? Morbid obesity with BMI of 50.0-59.9, adult ([place])   ? Precordial chest pain 03/06/2012  ? Dizziness 03/06/2012  ?  ?Past Medical History:  ?Diagnosis Date  ? Abnormal Pap smear of cervix   ? pt couldnt state what type of abnormality  ? Anemia   ? Arthritis   ? CAD in native artery 06/05/2015  ? Carpal tunnel syndrome, bilateral   ? Chest pain   ? a. 02/2012 abnl myoview - inferoapical reverisbility concerning for ischemia, EF 59%;   02/2012 nonobstructive cath  ? CHF (congestive heart failure) ([place])   ? Chronic migraine   ? Diabetes mellitus without complication ([place])   ? GERD (gastroesophageal reflux disease)   ? HLD (hyperlipidemia)   ? Hypertension   ? Hypokalemia 06/05/2015  ? Metabolic syndrome 56/43/3295  ? Morbid obesity with BMI of 50.0-59.9, adult ([place])   ? Prediabetes   ? S/P cardiac catheterization, non obstructive disease 06/04/15 06/05/2015  ? Sickle cell trait ([place])   ? Spondylolisthesis of lumbar region   ? Syncope   ? Wears glasses   ?  ?Family History  ?Problem Relation Age of Onset  ? Coronary artery disease Father 95  ? Heart disease Father   ? Other Father   ?     died in a MVA  ? Hypertension Father   ? Sudden death Mother 60  ?     Questionable MI  ? Diabetes Mother   ? Hypertension Mother   ? Hypertension Sister   ? Pancreatic cancer Maternal Aunt   ? Breast cancer Maternal Aunt   ?     after age 20  ? Stroke Paternal Grandmother   ? Colon polyps Sister   ? Colon cancer Neg Hx   ? Gallbladder disease Neg Hx   ? Esophageal cancer Neg Hx   ? ?Past Surgical History:  ?Procedure Laterality Date  ? BACK SURGERY    ? x1 lumbar fusion  ? CARDIAC CATHETERIZATION N/A 06/04/2015  ? Procedure: Left Heart Cath and  Coronary Angiography;  Surgeon: [name], MD;  Location: [facility];  Service: Cardiovascular;  Laterality: N/A;  ? CHOLECYSTECTOMY    ? laparoscopic  ? COLONOSCOPY WITH PROPOFOL N/A 09/04/2014  ? Procedure: COLONOSCOPY WITH PROPOFOL;  Surgeon: [name], MD;  Location: [facility];  Service: Endoscopy;  Laterality: N/A;  ? LEFT HEART CATHETERIZATION WITH CORONARY ANGIOGRAM N/A 03/08/2012  ? Procedure: LEFT HEART CATHETERIZATION WITH CORONARY ANGIOGRAM;  Surgeon: Hillary Bow, MD;  Location: [facility] CATH LAB;  Service: Cardiovascular;  Laterality: N/A;  ? SPINE SURGERY    ? fusion  ? TUBAL LIGATION    ? ?Social History  ? ?Social History Narrative  ? Lives at home alone.  ? ?Immunization History  ?Administered Date(s) Administered  ? Influenza,inj,Quad PF,6+ Mos 04/28/2014, 07/25/2016  ? Janssen (J&J) SARS-COV-2 Vaccination 11/03/2019  ?  Pneumococcal Polysaccharide-23 04/28/2014, 03/01/2017  ? Tdap 09/15/2012  ?  ? ?Objective: ?Vital Signs: BP (!) 163/84 (BP Location: Left Arm, Patient Position: Sitting, Cuff Size: Normal)   Pulse 72   Resp 17   Ht 5' 1.5" (1.562 m)   Wt 262 lb (118.8 kg)   LMP 09/15/2013   BMI 48.70 kg/m?   ? ?Physical Exam ?Constitutional:   ?   Appearance: She is obese.  ?Cardiovascular:  ?   Rate and Rhythm: Normal rate and regular rhythm.  ?Pulmonary:  ?   Effort: Pulmonary effort is normal.  ?   Breath sounds: Normal breath sounds.  ?Skin: ?   General: Skin is warm and dry.  ?   Findings: No rash.  ?Neurological:  ?   Mental Status: She is alert.  ?Psychiatric:     ?   Mood and Affect: Mood normal.  ?  ? ?Musculoskeletal Exam:  ?Shoulders full ROM no tenderness or swelling ?Elbows full ROM no tenderness or swelling ?Wrists full ROM, tenderness to pressure over flexor side of wrist, mild swelling present on left wrist ?Fingers full ROM no tenderness or swelling ? ? ?Investigation: ?No additional findings. ? ?Imaging: ?US Guided Needle Placement ? ?Result Date:  11/15/2021 ?Ultrasound guided injection is preferred based studies that show increased duration, increased effect, greater accuracy, decreased procedural pain, increased response rate, and decreased cost with ultrasound guid

## 2021-11-15 ENCOUNTER — Ambulatory Visit: Payer: Self-pay

## 2021-11-15 ENCOUNTER — Ambulatory Visit (INDEPENDENT_AMBULATORY_CARE_PROVIDER_SITE_OTHER): Payer: Medicare Other

## 2021-11-15 ENCOUNTER — Ambulatory Visit (INDEPENDENT_AMBULATORY_CARE_PROVIDER_SITE_OTHER): Payer: Medicare Other | Admitting: Internal Medicine

## 2021-11-15 ENCOUNTER — Encounter: Payer: Self-pay | Admitting: Internal Medicine

## 2021-11-15 VITALS — BP 163/84 | HR 72 | Resp 17 | Ht 61.5 in | Wt 262.0 lb

## 2021-11-15 DIAGNOSIS — G5602 Carpal tunnel syndrome, left upper limb: Secondary | ICD-10-CM

## 2021-11-15 DIAGNOSIS — M7989 Other specified soft tissue disorders: Secondary | ICD-10-CM

## 2021-11-15 DIAGNOSIS — R768 Other specified abnormal immunological findings in serum: Secondary | ICD-10-CM

## 2021-11-15 DIAGNOSIS — G5601 Carpal tunnel syndrome, right upper limb: Secondary | ICD-10-CM | POA: Diagnosis not present

## 2021-11-15 DIAGNOSIS — G5603 Carpal tunnel syndrome, bilateral upper limbs: Secondary | ICD-10-CM | POA: Diagnosis not present

## 2021-11-18 MED ORDER — TRIAMCINOLONE ACETONIDE 40 MG/ML IJ SUSP
20.0000 mg | INTRAMUSCULAR | Status: AC | PRN
  Administered 2021-11-15: 20 mg

## 2021-11-18 MED ORDER — LIDOCAINE HCL 1 % IJ SOLN
0.5000 mL | INTRAMUSCULAR | Status: AC | PRN
  Administered 2021-11-15: .5 mL

## 2021-11-18 MED ORDER — TRIAMCINOLONE ACETONIDE 40 MG/ML IJ SUSP
20.0000 mg | INTRAMUSCULAR | Status: AC | PRN
Start: 2021-11-15 — End: 2021-11-15
  Administered 2021-11-15: 20 mg

## 2021-11-18 MED ORDER — LIDOCAINE HCL 1 % IJ SOLN
0.5000 mL | INTRAMUSCULAR | Status: AC | PRN
Start: 2021-11-15 — End: 2021-11-15
  Administered 2021-11-15: .5 mL

## 2021-11-24 ENCOUNTER — Ambulatory Visit (INDEPENDENT_AMBULATORY_CARE_PROVIDER_SITE_OTHER): Payer: Medicare Other | Admitting: Nurse Practitioner

## 2021-11-24 DIAGNOSIS — Z0000 Encounter for general adult medical examination without abnormal findings: Secondary | ICD-10-CM

## 2021-11-24 NOTE — Progress Notes (Signed)
? ?Subjective:  ? Hannah Huerta is a 61 y.o. female who presents for an Initial Medicare Annual Wellness Visit. ? ? ?I connected with  [name] on 11/24/21 by a  telephone  enabled telemedicine application and verified that I am speaking with the correct person using two identifiers. ? ?Patient Location: Home ? ?Provider Location: Office/Clinic ? ?I discussed the limitations of evaluation and management by telemedicine. The patient expressed understanding and agreed to proceed.  ?Review of Systems    ? ?  ? ?   ?Objective:  ?  ?There were no vitals filed for this visit. ?There is no height or weight on file to calculate BMI. ? ? ?  01/27/2020  ? 11:33 AM 11/27/2019  ?  6:14 AM 10/16/2019  ?  9:29 AM 06/03/2018  ? 12:22 PM 03/07/2017  ?  9:54 AM 02/28/2017  ?  2:40 AM 02/27/2017  ?  9:17 PM  ?Advanced Directives  ?Does Patient Have a Medical Advance Directive? No No No No No No No  ?Would patient like information on creating a medical advance directive? No - Patient declined No - Patient declined No - Patient declined No - Patient declined  Yes (Inpatient - patient requests chaplain consult to create a medical advance directive)   ? ? ?Current Medications (verified) ?Outpatient Encounter Medications as of 11/24/2021  ?Medication Sig  ? amLODipine (NORVASC) 5 MG tablet Take 1 tablet (5 mg total) by mouth daily.  ? aspirin EC 81 MG tablet Take 1 tablet (81 mg total) by mouth daily.  ? atorvastatin (LIPITOR) 40 MG tablet Take 1 tablet (40 mg total) by mouth daily.  ? gabapentin (NEURONTIN) 100 MG capsule Take 1 capsule (100 mg total) by mouth at bedtime for 180 days, THEN 2 capsules (200 mg total) at bedtime.  ? metFORMIN (GLUCOPHAGE) 500 MG tablet Take 1 tablet (500 mg total) by mouth 2 (two) times daily with a meal.  ? carvedilol (COREG) 6.25 MG tablet Take 1 tablet (6.25 mg total) by mouth 2 (two) times daily with a meal. TAKE 1 TABLET BY MOUTH 2 TIMES DAILY WITH A MEAL.  ? ibuprofen (ADVIL) 600 MG tablet Take 600 mg  by mouth every 6 (six) hours as needed. (Patient not taking: Reported on 11/24/2021)  ? lidocaine (LIDODERM) 5 % Place 1 patch onto the skin daily. Remove & Discard patch within 12 hours or as directed by MD (Patient not taking: Reported on 09/22/2021)  ? naproxen (NAPROSYN) 375 MG tablet Take 1 tablet (375 mg total) by mouth 2 (two) times daily. (Patient not taking: Reported on 11/24/2021)  ? spironolactone (ALDACTONE) 25 MG tablet Take 1 tablet (25 mg total) by mouth daily.  ? ?No facility-administered encounter medications on file as of 11/24/2021.  ? ? ?Allergies (verified) ?Patient has no known allergies.  ? ?History: ?Past Medical History:  ?Diagnosis Date  ? Abnormal Pap smear of cervix   ? pt couldnt state what type of abnormality  ? Anemia   ? Arthritis   ? CAD in native artery 06/05/2015  ? Carpal tunnel syndrome, bilateral   ? Chest pain   ? a. 02/2012 abnl myoview - inferoapical reverisbility concerning for ischemia, EF 59%;   02/2012 nonobstructive cath  ? CHF (congestive heart failure) ([place])   ? Chronic migraine   ? Diabetes mellitus without complication ([place])   ? GERD (gastroesophageal reflux disease)   ? HLD (hyperlipidemia)   ? Hypertension   ? Hypokalemia 06/05/2015  ?  Metabolic syndrome 02/77/4128  ? Morbid obesity with BMI of 50.0-59.9, adult ([place])   ? Prediabetes   ? S/P cardiac catheterization, non obstructive disease 06/04/15 06/05/2015  ? Sickle cell trait ([place])   ? Spondylolisthesis of lumbar region   ? Syncope   ? Wears glasses   ? ?Past Surgical History:  ?Procedure Laterality Date  ? BACK SURGERY    ? x1 lumbar fusion  ? CARDIAC CATHETERIZATION N/A 06/04/2015  ? Procedure: Left Heart Cath and Coronary Angiography;  Surgeon: [name], MD;  Location: [place] [facility];  Service: Cardiovascular;  Laterality: N/A;  ? CHOLECYSTECTOMY    ? laparoscopic  ? COLONOSCOPY WITH PROPOFOL N/A 09/04/2014  ? Procedure: COLONOSCOPY WITH PROPOFOL;  Surgeon: [name], MD;  Location: [facility];   Service: Endoscopy;  Laterality: N/A;  ? LEFT HEART CATHETERIZATION WITH CORONARY ANGIOGRAM N/A 03/08/2012  ? Procedure: LEFT HEART CATHETERIZATION WITH CORONARY ANGIOGRAM;  Surgeon: Hillary Bow, MD;  Location: [facility] CATH LAB;  Service: Cardiovascular;  Laterality: N/A;  ? SPINE SURGERY    ? fusion  ? TUBAL LIGATION    ? ?Family History  ?Problem Relation Age of Onset  ? Coronary artery disease Father 21  ? Heart disease Father   ? Other Father   ?     died in a MVA  ? Hypertension Father   ? Sudden death Mother 65  ?     Questionable MI  ? Diabetes Mother   ? Hypertension Mother   ? Hypertension Sister   ? Pancreatic cancer Maternal Aunt   ? Breast cancer Maternal Aunt   ?     after age 31  ? Stroke Paternal Grandmother   ? Colon polyps Sister   ? Colon cancer Neg Hx   ? Gallbladder disease Neg Hx   ? Esophageal cancer Neg Hx   ? ?Social History  ? ?Socioeconomic History  ? Marital status: Widowed  ?  Spouse name: Not on file  ? Number of children: 3  ? Years of education: Not on file  ? Highest education level: Not on file  ?Occupational History  ? Occupation: Works in [employer]  ?  Employer: [employer]  ?Tobacco Use  ? Smoking status: Never  ? Smokeless tobacco: Never  ?Vaping Use  ? Vaping Use: Never used  ?Substance and Sexual Activity  ? Alcohol use: No  ? Drug use: No  ? Sexual activity: Not Currently  ?  Birth control/protection: Surgical  ?Other Topics Concern  ? Not on file  ?Social History Narrative  ? Lives at home alone.  ? ?Social Determinants of Health  ? ?Financial Resource Strain: Medium Risk  ? Difficulty of Paying Living Expenses: Somewhat hard  ?Food Insecurity: No Food Insecurity  ? Worried About Charity fundraiser in the Last Year: Never true  ? Ran Out of Food in the Last Year: Never true  ?Transportation Needs: No Transportation Needs  ? Lack of Transportation (Medical): No  ? Lack of Transportation (Non-Medical): No  ?Physical Activity: Insufficiently Active  ? Days of Exercise per Week: 1  day  ? Minutes of Exercise per Session: 10 min  ?Stress: Stress Concern Present  ? Feeling of Stress : To some extent  ?Social Connections: Moderately Isolated  ? Frequency of Communication with Friends and Family: More than three times a week  ? Frequency of Social Gatherings with Friends and Family: More than three times a week  ? Attends Religious Services:  More than 4 times per year  ? Active Member of Clubs or Organizations: No  ? Attends Archivist Meetings: Never  ? Marital Status: Widowed  ? ? ?Tobacco Counseling ?Counseling given: Not Answered ? ? ?Clinical Intake: ? ?Pre-visit preparation completed: Yes ? ?Pain : No/denies pain ? ?  ? ?Diabetes: Yes (prediabetic) ? ?How often do you need to have someone help you when you read instructions, pamphlets, or other written materials from your doctor or pharmacy?: 1 - Never ?What is the last grade level you completed in school?: 12th ? ?Diabetic?Nutrition Risk Assessment: ? ?Has the patient had any N/V/D within the last 2 months?  No  ?Does the patient have any non-healing wounds?  No  ?Has the patient had any unintentional weight loss or weight gain?  No  ? ?Diabetes: ? ?Is the patient diabetic?   Prediabetic ?If diabetic, was a CBG obtained today?  No  ?Did the patient bring in their glucometer from home?   Not in clinic visit ?How often do you monitor your CBG's? Not often  ? ?Financial Strains and Diabetes Management: ? ?Are you having any financial strains with the device, your supplies or your medication? No .  ?Does the patient want to be seen by Chronic Care Management for management of their diabetes?  No  ?Would the patient like to be referred to a Nutritionist or for Diabetic Management?  No  ? ?Diabetic Exams: ? ?Diabetic Eye Exam: Overdue for diabetic eye exam. Pt has been advised about the importance in completing this exam. Patient advised to call and schedule an eye exam. ?Diabetic Foot Exam: Overdue, Pt has been advised about the  importance in completing this exam. Pt is scheduled for diabetic foot exam on not schedule.  ? ?Interpreter Needed?: No ? ?Information entered by :: Blair Heys, RMA ? ? ?Activities of Daily Living ?   ? View : No

## 2021-12-07 ENCOUNTER — Other Ambulatory Visit: Payer: Self-pay

## 2021-12-07 DIAGNOSIS — R7303 Prediabetes: Secondary | ICD-10-CM

## 2021-12-07 MED ORDER — METFORMIN HCL 500 MG PO TABS: 500.0000 mg | ORAL_TABLET | Freq: Two times a day (BID) | ORAL | 3 refills | Status: DC

## 2022-01-03 ENCOUNTER — Encounter: Payer: Self-pay | Admitting: Nurse Practitioner

## 2022-01-03 ENCOUNTER — Ambulatory Visit (INDEPENDENT_AMBULATORY_CARE_PROVIDER_SITE_OTHER): Payer: Medicare Other | Admitting: Nurse Practitioner

## 2022-01-03 VITALS — BP 153/83 | HR 75 | Temp 97.8°F | Ht 61.0 in | Wt 255.8 lb

## 2022-01-03 DIAGNOSIS — N949 Unspecified condition associated with female genital organs and menstrual cycle: Secondary | ICD-10-CM

## 2022-01-03 DIAGNOSIS — E119 Type 2 diabetes mellitus without complications: Secondary | ICD-10-CM

## 2022-01-03 DIAGNOSIS — I10 Essential (primary) hypertension: Secondary | ICD-10-CM | POA: Diagnosis not present

## 2022-01-03 DIAGNOSIS — R300 Dysuria: Secondary | ICD-10-CM

## 2022-01-03 LAB — POCT URINALYSIS DIP (CLINITEK)
Bilirubin, UA: NEGATIVE
Glucose, UA: NEGATIVE mg/dL
Ketones, POC UA: NEGATIVE mg/dL
Leukocytes, UA: NEGATIVE
Nitrite, UA: NEGATIVE
POC PROTEIN,UA: 30 — AB
Spec Grav, UA: 1.02 (ref 1.010–1.025)
Urobilinogen, UA: 0.2 E.U./dL
pH, UA: 5.5 (ref 5.0–8.0)

## 2022-01-03 LAB — POCT GLYCOSYLATED HEMOGLOBIN (HGB A1C)
HbA1c POC (<> result, manual entry): 6 % (ref 4.0–5.6)
HbA1c, POC (controlled diabetic range): 6 % (ref 0.0–7.0)
HbA1c, POC (prediabetic range): 6 % (ref 5.7–6.4)
Hemoglobin A1C: 6 % — AB (ref 4.0–5.6)

## 2022-01-03 MED ORDER — AMLODIPINE BESYLATE 5 MG PO TABS: 10.0000 mg | ORAL_TABLET | Freq: Every day | ORAL | 0 refills | Status: DC

## 2022-01-03 MED ORDER — LISINOPRIL 20 MG PO TABS: 20.0000 mg | ORAL_TABLET | Freq: Every day | ORAL | 0 refills | Status: DC

## 2022-01-03 NOTE — Progress Notes (Signed)
[place],   [id number] Phone:  [phone]   Fax:  [phone] Subjective:   Patient ID: Hannah Huerta, female    DOB: 1961-02-08, 61 y.o.   MRN: [id number]  Chief Complaint  Patient presents with   Follow-up    Pt is here for 3 month BP follow up. Pt she has vaginal odor, burning after i urinates also pt is requesting a referral for the podiatrist on all medications    HPI Hannah Huerta 60 y.o. female  has a past medical history of Abnormal Pap smear of cervix, Anemia, Arthritis, CAD in native artery (06/05/2015), Carpal tunnel syndrome, bilateral, Chest pain, CHF (congestive heart failure) ([employer]), Chronic migraine, Diabetes mellitus without complication ([place]), GERD (gastroesophageal reflux disease), HLD (hyperlipidemia), Hypertension, Hypokalemia (68/06/7516), Metabolic syndrome (00/17/4944), Morbid obesity with BMI of 50.0-59.9, adult ([place]), Prediabetes, S/P cardiac catheterization, non obstructive disease 06/04/15 (06/05/2015), Sickle cell trait ([place]), Spondylolisthesis of lumbar region, Syncope, and Wears glasses. To the [facility] for reevaluation of chronic illness and dysuria.  Hypertension: Patient here for follow-up of elevated blood pressure. She is exercising and is adherent to low salt diet.  Blood pressure is not well controlled at home. Cardiac symptoms none. Patient denies chest pain, exertional chest pressure/discomfort, irregular heart beat, and near-syncope.  Cardiovascular risk factors: diabetes mellitus, hypertension, and obesity (BMI >= 30 kg/m2). Use of agents associated with hypertension: none. History of target organ damage: none. When questioned about B/P, states that she has not taken B/P medication this morning. B/P at home typically 140/?.  Diabetes Mellitus: Patient presents for follow up of diabetes. Symptoms: none. Patient denies none.  Evaluation to date has been included: hemoglobin A1C.  Home sugars: patient does  not check sugars. Treatment to date: no recent interventions.     Also concerned about burning with urination x 1 mth. States that the burning is the most pronounced after urination. Denies any vaginal discharge or sexual intercourse in the past 6 mths. Also endorses a mild vaginal odor, unable to describe. States that she may have recently changed vaginal washes, she is unsure. Denies any other concerns today.   Denies any fatigue, chest pain, shortness of breath, HA or dizziness. Denies any blurred vision, numbness or tingling.   Past Medical History:  Diagnosis Date   Abnormal Pap smear of cervix    pt couldnt state what type of abnormality   Anemia    Arthritis    CAD in native artery 06/05/2015   Carpal tunnel syndrome, bilateral    Chest pain    a. 02/2012 abnl myoview - inferoapical reverisbility concerning for ischemia, EF 59%;   02/2012 nonobstructive cath   CHF (congestive heart failure) (HCC)    Chronic migraine    Diabetes mellitus without complication (HCC)    GERD (gastroesophageal reflux disease)    HLD (hyperlipidemia)    Hypertension    Hypokalemia 96/75/9163   Metabolic syndrome 84/66/5993   Morbid obesity with BMI of 50.0-59.9, adult ([place])    Prediabetes    S/P cardiac catheterization, non obstructive disease 06/04/15 06/05/2015   Sickle cell trait (HCC)    Spondylolisthesis of lumbar region    Syncope    Wears glasses     Past Surgical History:  Procedure Laterality Date   BACK SURGERY     x1 lumbar fusion   CARDIAC CATHETERIZATION N/A 06/04/2015   Procedure: Left Heart Cath and Coronary Angiography;  Surgeon: [name], MD;  Location: [facility];  Service: Cardiovascular;  Laterality: N/A;   CHOLECYSTECTOMY     laparoscopic   COLONOSCOPY WITH PROPOFOL N/A 09/04/2014   Procedure: COLONOSCOPY WITH PROPOFOL;  Surgeon: [name], MD;  Location: [facility];  Service: Endoscopy;  Laterality: N/A;   LEFT HEART CATHETERIZATION WITH CORONARY  ANGIOGRAM N/A 03/08/2012   Procedure: LEFT HEART CATHETERIZATION WITH CORONARY ANGIOGRAM;  Surgeon: Hillary Bow, MD;  Location: [facility] CATH LAB;  Service: Cardiovascular;  Laterality: N/A;   SPINE SURGERY     fusion   TUBAL LIGATION      Family History  Problem Relation Age of Onset   Coronary artery disease Father 78   Heart disease Father    Other Father        died in a MVA   Hypertension Father    Sudden death Mother 15       Questionable MI   Diabetes Mother    Hypertension Mother    Hypertension Sister    Pancreatic cancer Maternal Aunt    Breast cancer Maternal Aunt        after age 17   Stroke Paternal Grandmother    Colon polyps Sister    Colon cancer Neg Hx    Gallbladder disease Neg Hx    Esophageal cancer Neg Hx     Social History   Socioeconomic History   Marital status: Widowed    Spouse name: Not on file   Number of children: 3   Years of education: Not on file   Highest education level: Not on file  Occupational History   Occupation: Works in the Estate manager/land agent: [employer] Use   Smoking status: Never   Smokeless tobacco: Never  Vaping Use   Vaping Use: Never used  Substance and Sexual Activity   Alcohol use: No   Drug use: No   Sexual activity: Not Currently    Birth control/protection: Surgical  Other Topics Concern   Not on file  Social History Narrative   Lives at home alone.   Social Determinants of Health   Financial Resource Strain: Medium Risk (11/24/2021)   Overall Financial Resource Strain (CARDIA)    Difficulty of Paying Living Expenses: Somewhat hard  Food Insecurity: No Food Insecurity (11/24/2021)   Hunger Vital Sign    Worried About Running Out of Food in the Last Year: Never true    Ran Out of Food in the Last Year: Never true  Transportation Needs: No Transportation Needs (11/24/2021)   PRAPARE - Hydrologist (Medical): No    Lack of Transportation (Non-Medical): No  Physical  Activity: Insufficiently Active (11/24/2021)   Exercise Vital Sign    Days of Exercise per Week: 1 day    Minutes of Exercise per Session: 10 min  Stress: Stress Concern Present (11/24/2021)   [place]    Feeling of Stress : To some extent  Social Connections: Moderately Isolated (11/24/2021)   Social Connection and Isolation Panel [NHANES]    Frequency of Communication with Friends and Family: More than three times a week    Frequency of Social Gatherings with Friends and Family: More than three times a week    Attends Religious Services: More than 4 times per year    Active Member of [employer] or Organizations: No    Attends Archivist Meetings: Never    Marital Status: Widowed  Intimate  Partner Violence: Not At Risk (11/24/2021)   Humiliation, Afraid, Rape, and Kick questionnaire    Fear of Current or Ex-Partner: No    Emotionally Abused: No    Physically Abused: No    Sexually Abused: No    Outpatient Medications Prior to Visit  Medication Sig Dispense Refill   aspirin EC 81 MG tablet Take 1 tablet (81 mg total) by mouth daily. 30 tablet 11   atorvastatin (LIPITOR) 40 MG tablet Take 1 tablet (40 mg total) by mouth daily. 90 tablet 1   gabapentin (NEURONTIN) 100 MG capsule Take 1 capsule (100 mg total) by mouth at bedtime for 180 days, THEN 2 capsules (200 mg total) at bedtime. 60 capsule 3   metFORMIN (GLUCOPHAGE) 500 MG tablet Take 1 tablet (500 mg total) by mouth 2 (two) times daily with a meal. 90 tablet 3   amLODipine (NORVASC) 5 MG tablet Take 1 tablet (5 mg total) by mouth daily. 90 tablet 3   carvedilol (COREG) 6.25 MG tablet Take 1 tablet (6.25 mg total) by mouth 2 (two) times daily with a meal. TAKE 1 TABLET BY MOUTH 2 TIMES DAILY WITH A MEAL. 180 tablet 3   ibuprofen (ADVIL) 600 MG tablet Take 600 mg by mouth every 6 (six) hours as needed. (Patient not taking: Reported on 11/24/2021)     lidocaine  (LIDODERM) 5 % Place 1 patch onto the skin daily. Remove & Discard patch within 12 hours or as directed by MD (Patient not taking: Reported on 09/22/2021) 30 patch 0   naproxen (NAPROSYN) 375 MG tablet Take 1 tablet (375 mg total) by mouth 2 (two) times daily. (Patient not taking: Reported on 11/24/2021) 20 tablet 0   spironolactone (ALDACTONE) 25 MG tablet Take 1 tablet (25 mg total) by mouth daily. 90 tablet 3   No facility-administered medications prior to visit.    No Known Allergies  Review of Systems  Constitutional:  Negative for chills, fever and malaise/fatigue.  HENT: Negative.    Eyes: Negative.   Respiratory:  Negative for cough and shortness of breath.   Cardiovascular:  Negative for chest pain, palpitations and leg swelling.  Gastrointestinal:  Negative for abdominal pain, blood in stool, constipation, diarrhea, nausea and vomiting.  Genitourinary:  Positive for dysuria. Negative for flank pain, frequency, hematuria and urgency.       See HPI  Skin: Negative.   Neurological: Negative.   Psychiatric/Behavioral:  Negative for depression. The patient is not nervous/anxious.   All other systems reviewed and are negative.      Objective:    Physical Exam Vitals reviewed.  Constitutional:      General: She is not in acute distress.    Appearance: Normal appearance. She is obese.  HENT:     Head: Normocephalic.  Neck:     Vascular: No carotid bruit.  Cardiovascular:     Rate and Rhythm: Normal rate and regular rhythm.     Pulses: Normal pulses.     Heart sounds: Normal heart sounds.     Comments: No obvious peripheral edema Pulmonary:     Effort: Pulmonary effort is normal.     Breath sounds: Normal breath sounds.  Abdominal:     General: Abdomen is flat. Bowel sounds are normal. There is no distension.     Palpations: Abdomen is soft. There is no mass.     Tenderness: There is no abdominal tenderness. There is no right CVA tenderness, left CVA tenderness, guarding  or rebound.  Hernia: No hernia is present.  Musculoskeletal:        General: No swelling, tenderness, deformity or signs of injury. Normal range of motion.     Cervical back: Normal range of motion and neck supple. No rigidity or tenderness.     Right lower leg: No edema.     Left lower leg: No edema.  Lymphadenopathy:     Cervical: No cervical adenopathy.  Skin:    General: Skin is warm and dry.     Capillary Refill: Capillary refill takes less than 2 seconds.  Neurological:     General: No focal deficit present.     Mental Status: She is alert and oriented to person, place, and time.  Psychiatric:        Mood and Affect: Mood normal.        Behavior: Behavior normal.        Thought Content: Thought content normal.        Judgment: Judgment normal.     BP (!) 153/83 (BP Location: Left Arm, Patient Position: Sitting, Cuff Size: Large)   Pulse 75   Temp 97.8 F (36.6 C)   Ht 5' 1"  (1.549 m)   Wt 255 lb 12.8 oz (116 kg)   LMP 09/15/2013   SpO2 98%   BMI 48.33 kg/m  Wt Readings from Last 3 Encounters:  01/03/22 255 lb 12.8 oz (116 kg)  11/15/21 262 lb (118.8 kg)  10/03/21 261 lb 9.6 oz (118.7 kg)    Immunization History  Administered Date(s) Administered   Influenza,inj,Quad PF,6+ Mos 04/28/2014, 07/25/2016   Janssen (J&J) SARS-COV-2 Vaccination 11/03/2019   Pneumococcal Polysaccharide-23 04/28/2014, 03/01/2017   Tdap 09/15/2012    Diabetic Foot Exam - Simple   No data filed     Lab Results  Component Value Date   TSH 1.570 09/08/2019   Lab Results  Component Value Date   WBC 4.4 10/27/2020   HGB 11.3 10/27/2020   HCT 35.4 10/27/2020   MCV 79 10/27/2020   PLT 305 10/27/2020   Lab Results  Component Value Date   NA 144 08/08/2021   K 4.1 08/08/2021   CO2 28 02/06/2020   GLUCOSE 100 (H) 08/08/2021   BUN 12 08/08/2021   CREATININE 0.88 08/08/2021   BILITOT 0.3 08/08/2021   ALKPHOS 101 08/08/2021   AST 19 08/08/2021   ALT 13 02/05/2019   PROT  7.8 08/08/2021   ALBUMIN 4.4 08/08/2021   CALCIUM 9.5 08/08/2021   ANIONGAP 10 02/06/2020   EGFR 75 08/08/2021   Lab Results  Component Value Date   CHOL 137 08/08/2021   CHOL 149 05/06/2021   CHOL 138 03/10/2020   Lab Results  Component Value Date   HDL 54 08/08/2021   HDL 55 05/06/2021   HDL 49 03/10/2020   Lab Results  Component Value Date   LDLCALC 70 08/08/2021   LDLCALC 81 05/06/2021   LDLCALC 74 03/10/2020   Lab Results  Component Value Date   TRIG 60 08/08/2021   TRIG 64 05/06/2021   TRIG 74 03/10/2020   Lab Results  Component Value Date   CHOLHDL 2.5 08/08/2021   CHOLHDL 2.7 05/06/2021   CHOLHDL 2.8 03/10/2020   Lab Results  Component Value Date   HGBA1C 6.0 (A) 01/03/2022   HGBA1C 6.0 01/03/2022   HGBA1C 6.0 01/03/2022   HGBA1C 6.0 01/03/2022       Assessment & Plan:   Problem List Items Addressed This Visit   None Visit Diagnoses  Vaginal burning    -  Primary   Relevant Orders   POCT glycosylated hemoglobin (Hb A1C) (Completed): 6.0, improved since previous visit and at goal   NuSwab Vaginitis Plus (VG+)   POCT URINALYSIS DIP (CLINITEK) (Completed) Discussed possible causes  Given anticipatory guidance Discussed non pharmacological methods for management of symptoms Informed to take OTC medications as needed    Dysuria     Discussed possible causes  Given anticipatory guidance  Discussed non pharmacological methods for management of symptoms Informed to take OTC medications as needed    Type 2 diabetes mellitus without complication, without long-term current use of insulin (HCC)       Relevant Medications   lisinopril (ZESTRIL) 20 MG tablet, initiated during visit Encouraged continued diet and exercise efforts  Encouraged continued compliance with medication     Essential hypertension       Relevant Medications   amLODipine (NORVASC) 5 MG tablet, increased to 10 mg during visit   lisinopril (ZESTRIL) 20 MG tablet Encouraged  continued diet and exercise efforts  Encouraged continued compliance with medication  Encouraged to continue checking B/P at home regularly   Follow up in 3 mths for reevaluation of HTN and DM, sooner as needed    I have changed Sagrario L. Zundel's amLODipine. I am also having her start on lisinopril. Additionally, I am having her maintain her aspirin EC, carvedilol, spironolactone, atorvastatin, gabapentin, naproxen, lidocaine, ibuprofen, and metFORMIN.  Meds ordered this encounter  Medications   amLODipine (NORVASC) 5 MG tablet    Sig: Take 2 tablets (10 mg total) by mouth daily.    Dispense:  180 tablet    Refill:  0   lisinopril (ZESTRIL) 20 MG tablet    Sig: Take 1 tablet (20 mg total) by mouth daily.    Dispense:  90 tablet    Refill:  0     Teena Dunk, NP

## 2022-01-03 NOTE — Patient Instructions (Signed)
You were seen today in the [facility] for reevaluation of chronic illness and dysuria. Labs were collected, results will be available via MyChart or, if abnormal, you will be contacted by clinic staff. You were prescribed medications, please take as directed. Please follow up in 3 mths for reevaluation HTN and DM

## 2022-01-06 LAB — NUSWAB VAGINITIS PLUS (VG+)
Atopobium vaginae: HIGH Score — AB
BVAB 2: HIGH Score — AB
Candida albicans, NAA: NEGATIVE
Candida glabrata, NAA: NEGATIVE
Chlamydia trachomatis, NAA: NEGATIVE
Megasphaera 1: HIGH Score — AB
Neisseria gonorrhoeae, NAA: NEGATIVE
Trich vag by NAA: NEGATIVE

## 2022-01-10 ENCOUNTER — Other Ambulatory Visit: Payer: Self-pay | Admitting: Nurse Practitioner

## 2022-01-10 DIAGNOSIS — B9689 Other specified bacterial agents as the cause of diseases classified elsewhere: Secondary | ICD-10-CM

## 2022-01-10 MED ORDER — METRONIDAZOLE 500 MG PO TABS: 500.0000 mg | ORAL_TABLET | Freq: Two times a day (BID) | ORAL | 0 refills | Status: AC

## 2022-01-24 ENCOUNTER — Other Ambulatory Visit: Payer: Self-pay | Admitting: Family Medicine

## 2022-01-24 ENCOUNTER — Telehealth: Payer: Self-pay | Admitting: Family Medicine

## 2022-01-24 DIAGNOSIS — I10 Essential (primary) hypertension: Secondary | ICD-10-CM

## 2022-01-24 DIAGNOSIS — B9689 Other specified bacterial agents as the cause of diseases classified elsewhere: Secondary | ICD-10-CM

## 2022-01-24 MED ORDER — METRONIDAZOLE 0.75 % EX GEL: 1.0000 | Freq: Every day | CUTANEOUS | 0 refills | Status: AC

## 2022-01-24 MED ORDER — CARVEDILOL 6.25 MG PO TABS: 6.2500 mg | ORAL_TABLET | Freq: Two times a day (BID) | ORAL | 3 refills | Status: DC

## 2022-01-24 MED ORDER — SPIRONOLACTONE 25 MG PO TABS: 25.0000 mg | ORAL_TABLET | Freq: Every day | ORAL | 3 refills | Status: DC

## 2022-01-24 NOTE — Progress Notes (Signed)
Meds ordered this encounter  Medications   spironolactone (ALDACTONE) 25 MG tablet    Sig: Take 1 tablet (25 mg total) by mouth daily.    Dispense:  90 tablet    Refill:  3    Order Specific Question:   Supervising Provider    Answer:   Tresa Garter [[id number]]   carvedilol (COREG) 6.25 MG tablet    Sig: Take 1 tablet (6.25 mg total) by mouth 2 (two) times daily with a meal. TAKE 1 TABLET BY MOUTH 2 TIMES DAILY WITH A MEAL.    Dispense:  180 tablet    Refill:  3    Needs appointment    Order Specific Question:   Supervising Provider    Answer:   Tresa Garter [[id number]]

## 2022-01-24 NOTE — Progress Notes (Signed)
Meds ordered this encounter  Medications   metroNIDAZOLE (METROGEL) 0.75 % gel    Sig: Apply 1 Application topically daily at 12 noon for 5 days.    Dispense:  45 g    Refill:  0    Order Specific Question:   Supervising Provider    Answer:   Tresa Garter [id number]

## 2022-01-24 NOTE — Telephone Encounter (Signed)
Refill request: Spironolatone '25mg'$  Carvedilol 6.'25mg'$    Pharmacy: [facility] [phone]

## 2022-02-10 ENCOUNTER — Telehealth: Payer: Self-pay

## 2022-02-10 DIAGNOSIS — E7849 Other hyperlipidemia: Secondary | ICD-10-CM

## 2022-02-10 MED ORDER — ATORVASTATIN CALCIUM 40 MG PO TABS: 40.0000 mg | ORAL_TABLET | Freq: Every day | ORAL | 0 refills | Status: DC

## 2022-02-10 NOTE — Telephone Encounter (Signed)
Atorvastatin

## 2022-02-10 NOTE — Telephone Encounter (Signed)
Refill sent per request.   Mychart sent patient informing of same.

## 2022-02-13 NOTE — Progress Notes (Signed)
Office Visit Note  Patient: Hannah Huerta             Date of Birth: [birth year]           MRN: [id number]             PCP: Teena Dunk, NP Referring: Teena Dunk, NP Visit Date: 02/20/2022   Subjective:  Follow-up (Less pain since last injection.)   History of Present Illness: Hannah Huerta is a 61 y.o. female here for follow up bilateral hand pains consistent with CTS and possible RA. She felt a large improvement 1 day after wrist injections last visit and continues to do better. She is able to grip steering wheel normally and does not wake at night from wrist pain any more. Her right ankle has some pain and swelling worse later in the day, this is typical for her especially in the summer. She still has morning stiffness in multiple areas lasting only a few minutes.  Previous HPI 11/18/2021 Hannah Huerta is a 61 y.o. female here for follow up for bilateral hand pains consistent with CTS and possible RA. She continues to have similar amount of wrist pain and swelling since our last visit. No appreciable benefit with conservative treatment of stretching or bracing wrist in neutral position.   09/22/21 Hannah Huerta is a 61 y.o. female here for evaluation for possible rheumatoid arthritis. Her history includes T2DM, migraine, CAD, CHF, and chronic low back pain with DJD and right sided sciatica. She has joint pain in multiple areas treatments including acetaminophen, topical diclofenac, gabapentin, naproxen although avoiding long term NSAIDs due to renal function.  He has had trouble due to right sided foot drop ongoing since about the past 4 years.  This is thought to be from residual damage due to significant lumbar spinal stenosis now status post surgical fusion.  Swelling and pain in this foot is worse in the past 1 year.  She is also having trouble with bilateral hands with pain and swelling also reports hand numbness that is worse at night mornings and when driving long  distances.  She had previous evaluation with the symptoms in 2020 showing positive rheumatoid factor and CCP antibodies. She was never definitively diagnosed or on any disease specific treatments for RA.   Labs reviewed 08/2018 RF 57.4 CCP 168 ANA neg  Review of Systems  Constitutional:  Negative for fatigue.  HENT:  Negative for mouth sores and mouth dryness.   Eyes:  Negative for dryness.  Respiratory:  Negative for shortness of breath.   Cardiovascular:  Negative for chest pain and palpitations.  Gastrointestinal:  Negative for blood in stool, constipation and diarrhea.  Endocrine: Negative for increased urination.  Genitourinary:  Negative for involuntary urination.  Musculoskeletal:  Positive for joint pain, joint pain, joint swelling and morning stiffness. Negative for myalgias, muscle weakness, muscle tenderness and myalgias.  Skin:  Positive for rash and sensitivity to sunlight. Negative for color change and hair loss.  Allergic/Immunologic: Negative for susceptible to infections.  Neurological:  Negative for dizziness and headaches.  Hematological:  Negative for swollen glands.  Psychiatric/Behavioral:  Positive for sleep disturbance. Negative for depressed mood. The patient is not nervous/anxious.     PMFS History:  Patient Active Problem List   Diagnosis Date Noted   Medial orbital wall fracture, closed, initial encounter ([place]) 10/03/2021   Bilateral carpal tunnel syndrome 09/22/2021   Rheumatoid factor positive 09/22/2021   Bilateral hand swelling 09/22/2021  Spondylolisthesis 05/06/2021   Uncontrolled type 2 diabetes mellitus with microalbuminuria 03/28/2021   Foot-drop 01/04/2021   Status post lumbar spinal fusion 02/12/2020   Spondylolisthesis of lumbar region 02/05/2020   Arthropathy of lumbar facet joint 12/11/2019   Chronic right-sided low back pain with right-sided sciatica 11/04/2019   Other chronic pain 11/04/2019   DDD (degenerative disc disease), lumbar  11/04/2019   Essential (primary) hypertension 11/04/2019   Body mass index (BMI) 50.0-59.9, adult ([place]) 11/04/2019   Spinal stenosis of lumbar region 11/04/2019   Spondylolisthesis, lumbar region 11/04/2019   Vitamin D deficiency 09/10/2019   CHF (congestive heart failure) ([place]) 01/30/2018   OSA (obstructive sleep apnea) 03/01/2017   ARF (acute renal failure) ([place]) 02/28/2017   Hyponatremia 02/28/2017   Syncope 07/23/2016   Syncope and collapse 07/23/2016   S/P cardiac catheterization, non obstructive disease 06/04/15 06/05/2015   CAD in native artery 06/05/2015   Fever 06/05/2015   Hypokalemia 14/48/1856   Metabolic syndrome 31/49/7026   Chest pain, neg MI, possible GI 06/04/2015   Colon cancer screening 08/05/2014   Severe obesity (BMI >= 40) ([place]) 08/05/2014   Health care maintenance 05/13/2014   Prediabetes 05/13/2014   Chronic migraine 04/27/2014   Atypical chest pain 04/27/2014   SOB (shortness of breath) 09/16/2013   Palpitations 09/16/2013   HTN (hypertension) 03/09/2012   Hyperlipidemia 03/09/2012   Microcytic anemia    Morbid obesity with BMI of 50.0-59.9, adult ([place])    Precordial chest pain 03/06/2012   Dizziness 03/06/2012    Past Medical History:  Diagnosis Date   Abnormal Pap smear of cervix    pt couldnt state what type of abnormality   Anemia    Arthritis    CAD in native artery 06/05/2015   Carpal tunnel syndrome, bilateral    Chest pain    a. 02/2012 abnl myoview - inferoapical reverisbility concerning for ischemia, EF 59%;   02/2012 nonobstructive cath   CHF (congestive heart failure) (HCC)    Chronic migraine    Diabetes mellitus without complication (HCC)    GERD (gastroesophageal reflux disease)    HLD (hyperlipidemia)    Hypertension    Hypokalemia 37/85/8850   Metabolic syndrome 27/74/1287   Morbid obesity with BMI of 50.0-59.9, adult ([place])    Prediabetes    S/P cardiac catheterization, non obstructive disease 06/04/15 06/05/2015   Sickle  cell trait (HCC)    Spondylolisthesis of lumbar region    Syncope    Wears glasses     Family History  Problem Relation Age of Onset   Coronary artery disease Father 69   Heart disease Father    Other Father        died in a MVA   Hypertension Father    Sudden death Mother 55       Questionable MI   Diabetes Mother    Hypertension Mother    Hypertension Sister    Pancreatic cancer Maternal Aunt    Breast cancer Maternal Aunt        after age 13   Stroke Paternal Grandmother    Colon polyps Sister    Colon cancer Neg Hx    Gallbladder disease Neg Hx    Esophageal cancer Neg Hx    Past Surgical History:  Procedure Laterality Date   BACK SURGERY     x1 lumbar fusion   CARDIAC CATHETERIZATION N/A 06/04/2015   Procedure: Left Heart Cath and Coronary Angiography;  Surgeon: [name], MD;  Location: [facility];  Service: Cardiovascular;  Laterality: N/A;   CHOLECYSTECTOMY     laparoscopic   COLONOSCOPY WITH PROPOFOL N/A 09/04/2014   Procedure: COLONOSCOPY WITH PROPOFOL;  Surgeon: [name], MD;  Location: [facility];  Service: Endoscopy;  Laterality: N/A;   LEFT HEART CATHETERIZATION WITH CORONARY ANGIOGRAM N/A 03/08/2012   Procedure: LEFT HEART CATHETERIZATION WITH CORONARY ANGIOGRAM;  Surgeon: Hillary Bow, MD;  Location: [facility] CATH LAB;  Service: Cardiovascular;  Laterality: N/A;   SPINE SURGERY     fusion   TUBAL LIGATION     Social History   Social History Narrative   Lives at home alone.   Immunization History  Administered Date(s) Administered   Influenza,inj,Quad PF,6+ Mos 04/28/2014, 07/25/2016   Janssen (J&J) SARS-COV-2 Vaccination 11/03/2019   Pneumococcal Polysaccharide-23 04/28/2014, 03/01/2017   Tdap 09/15/2012     Objective: Vital Signs: BP (!) 142/84 (BP Location: Left Arm, Patient Position: Sitting, Cuff Size: Large)   Pulse 72   Resp 14   Ht 5' 1.5" (1.562 m)   Wt 255 lb 6.4 oz (115.8 kg)   LMP 09/15/2013   BMI 47.48 kg/m     Physical Exam Constitutional:      Appearance: She is obese.  Cardiovascular:     Rate and Rhythm: Normal rate and regular rhythm.  Pulmonary:     Effort: Pulmonary effort is normal.     Breath sounds: Normal breath sounds.  Skin:    General: Skin is warm and dry.     Findings: No rash.  Neurological:     Mental Status: She is alert.  Psychiatric:        Mood and Affect: Mood normal.      Musculoskeletal Exam: Shoulders full ROM no tenderness or swelling Elbows full ROM no tenderness or swelling Wrists full ROM no tenderness or swelling Fingers full ROM no tenderness or swelling Knees full ROM no tenderness or swelling Ankles full ROM no tenderness or swelling   Investigation: No additional findings.  Imaging: No results found.  Recent Labs: Lab Results  Component Value Date   WBC 4.4 10/27/2020   HGB 11.3 10/27/2020   PLT 305 10/27/2020   NA 144 08/08/2021   K 4.1 08/08/2021   CL 104 08/08/2021   CO2 28 02/06/2020   GLUCOSE 100 (H) 08/08/2021   BUN 12 08/08/2021   CREATININE 0.88 08/08/2021   BILITOT 0.3 08/08/2021   ALKPHOS 101 08/08/2021   AST 19 08/08/2021   ALT 13 02/05/2019   PROT 7.8 08/08/2021   ALBUMIN 4.4 08/08/2021   CALCIUM 9.5 08/08/2021   GFRAA 77 03/10/2020    Speciality Comments: No specialty comments available.  Procedures:  No procedures performed Allergies: Patient has no known allergies.   Assessment / Plan:     Visit Diagnoses: Bilateral hand swelling  Rheumatoid factor positive - Plan: Rheumatoid factor, Sedimentation rate  Her symptoms are now doing a lot better after local steroid injection for the bilateral carpal tunnel syndrome.  I am not seeing a significant amount of swelling or synovitis at this time.  Plan to recheck rheumatoid factor and sedimentation rate markers.  I suspect we can continue with just observation at this time unless swelling and symptoms start to recur follow-up as needed.  Bilateral carpal tunnel  syndrome - Bilateral carpal tunnel injection 11/15/2021.   Symptoms are doing well she noticed an almost immediate improvement would consider repeat injections if there is a pretty durable benefit if returning within a few  months would refer to orthopedic surgery about possible surgery.   Orders: Orders Placed This Encounter  Procedures   Rheumatoid factor   Sedimentation rate   No orders of the defined types were placed in this encounter.    Follow-Up Instructions: Return in about 1 year (around 02/21/2023) for CTS/OA/?RA f/u 1 yr or PRN sooner.   Collier Salina, MD  Note - This record has been created using [employer].  Chart creation errors have been sought, but may not always  have been located. Such creation errors do not reflect on  the standard of medical care.

## 2022-02-20 ENCOUNTER — Ambulatory Visit: Payer: Medicare Other | Attending: Internal Medicine | Admitting: Internal Medicine

## 2022-02-20 ENCOUNTER — Encounter: Payer: Self-pay | Admitting: Internal Medicine

## 2022-02-20 VITALS — BP 142/84 | HR 72 | Resp 14 | Ht 61.5 in | Wt 255.4 lb

## 2022-02-20 DIAGNOSIS — R768 Other specified abnormal immunological findings in serum: Secondary | ICD-10-CM | POA: Diagnosis not present

## 2022-02-20 DIAGNOSIS — G5603 Carpal tunnel syndrome, bilateral upper limbs: Secondary | ICD-10-CM

## 2022-02-20 DIAGNOSIS — M7989 Other specified soft tissue disorders: Secondary | ICD-10-CM | POA: Diagnosis not present

## 2022-02-21 LAB — RHEUMATOID FACTOR: Rheumatoid fact SerPl-aCnc: 25 IU/mL — ABNORMAL HIGH (ref <14)

## 2022-02-21 LAB — SEDIMENTATION RATE: Sed Rate: 33 mm/h — ABNORMAL HIGH (ref 0–30)

## 2022-02-21 NOTE — Progress Notes (Signed)
Rheumatoid factor and sedimentation rate are still mildly positive but about the same as 5 months ago. I think we are fine to just monitor for now and follow up next year or if symptoms flare up.

## 2022-03-01 DIAGNOSIS — E119 Type 2 diabetes mellitus without complications: Secondary | ICD-10-CM | POA: Diagnosis not present

## 2022-03-30 ENCOUNTER — Encounter: Payer: Self-pay | Admitting: Podiatry

## 2022-03-30 ENCOUNTER — Ambulatory Visit (INDEPENDENT_AMBULATORY_CARE_PROVIDER_SITE_OTHER): Payer: Medicare Other

## 2022-03-30 ENCOUNTER — Ambulatory Visit (INDEPENDENT_AMBULATORY_CARE_PROVIDER_SITE_OTHER): Payer: Medicare Other | Admitting: Podiatry

## 2022-03-30 DIAGNOSIS — S99911A Unspecified injury of right ankle, initial encounter: Secondary | ICD-10-CM | POA: Diagnosis not present

## 2022-03-30 DIAGNOSIS — S92351A Displaced fracture of fifth metatarsal bone, right foot, initial encounter for closed fracture: Secondary | ICD-10-CM

## 2022-03-31 ENCOUNTER — Other Ambulatory Visit: Payer: Self-pay | Admitting: Podiatry

## 2022-03-31 DIAGNOSIS — S92351A Displaced fracture of fifth metatarsal bone, right foot, initial encounter for closed fracture: Secondary | ICD-10-CM

## 2022-03-31 NOTE — Progress Notes (Signed)
Subjective:   Patient ID: Hannah Huerta, female   DOB: 61 y.o.   MRN: [id number]   HPI Patient states that she fell and turned her right ankle from a fall this morning and thinks that she sprained it.  Its been sore and there is been swelling and she is concerned about fracture.  Patient is obese does not smoke and is not active   Review of Systems  All other systems reviewed and are negative.       Objective:  Physical Exam Vitals and nursing note reviewed.  Constitutional:      Appearance: She is well-developed.  Pulmonary:     Effort: Pulmonary effort is normal.  Musculoskeletal:        General: Normal range of motion.  Skin:    General: Skin is warm.  Neurological:     Mental Status: She is alert.     Neurovascular status was found to be intact with patient having significant motion loss right but is splinting due to the pain in her lateral side of her foot.  The pain is mostly at the base of the fifth metatarsal with fluid buildup around this area and into the ankle and patient has good digital perfusion well-oriented x3     Assessment:  Significant swelling of the right foot with probability of injury whether fracture or nonfracture     Plan:  H&P x-ray reviewed discussed fracture base of fifth metatarsal right applied Unna boot to reduce the swelling instructed on leaving it on 4 to 5 days but take it off earlier if any throbbing or digital issues should occur and applied Ace wrap surgical shoe.  Discussed cast immobilization which may be necessary and we will see her back in 2 weeks and she understands she ultimately may require surgery which we would like to avoid  X-rays indicate that there is a break of the base of the fifth metatarsal right not a Jones appears to be avulsion

## 2022-04-01 ENCOUNTER — Other Ambulatory Visit: Payer: Self-pay | Admitting: Nurse Practitioner

## 2022-04-01 DIAGNOSIS — I10 Essential (primary) hypertension: Secondary | ICD-10-CM

## 2022-04-06 ENCOUNTER — Ambulatory Visit: Payer: Medicare Other | Admitting: Nurse Practitioner

## 2022-04-13 ENCOUNTER — Encounter: Payer: Self-pay | Admitting: Podiatry

## 2022-04-13 ENCOUNTER — Ambulatory Visit (INDEPENDENT_AMBULATORY_CARE_PROVIDER_SITE_OTHER): Payer: Medicare Other | Admitting: Podiatry

## 2022-04-13 ENCOUNTER — Ambulatory Visit (INDEPENDENT_AMBULATORY_CARE_PROVIDER_SITE_OTHER): Payer: Medicare Other

## 2022-04-13 DIAGNOSIS — S92351A Displaced fracture of fifth metatarsal bone, right foot, initial encounter for closed fracture: Secondary | ICD-10-CM | POA: Diagnosis not present

## 2022-04-13 DIAGNOSIS — S92351G Displaced fracture of fifth metatarsal bone, right foot, subsequent encounter for fracture with delayed healing: Secondary | ICD-10-CM

## 2022-04-13 NOTE — Progress Notes (Signed)
Subjective:   Patient ID: Hannah Huerta, female   DOB: 61 y.o.   MRN: [id number]   HPI Patient states still having a lot of pain in the outside of the right foot and I do not feel like I get enough support from the surgical shoe and I get a lot of pain with movement and walking.  Neuro   ROS      Objective:  Physical Exam  Vascular status intact with patient found to have reduced edema in the right foot with pain of an exquisite nature base of fifth metatarsal right with fracture that occurred of the base of the fifth metatarsal a number of weeks ago     Assessment:  Fracture of the base of the fifth metatarsal right unstable with pain     Plan:  H&P reviewed condition and discussed with Hannah Huerta I do think prolonged immobilization will be necessary and there is still a possibility in the end this will require surgery.  I educated her on that today I went ahead and applied air fracture walker which was fitted properly to her lower leg with all instructions given and she was able to walk with a comfortably and advised her on ice therapy compression therapy reappoint 3 weeks or earlier if needed  X-rays indicate there is a significant detachment of bone on the outside of the fifth metatarsal right secondary to inversion injury

## 2022-04-17 ENCOUNTER — Other Ambulatory Visit: Payer: Self-pay | Admitting: Nurse Practitioner

## 2022-04-17 DIAGNOSIS — M792 Neuralgia and neuritis, unspecified: Secondary | ICD-10-CM

## 2022-04-27 ENCOUNTER — Encounter: Payer: Self-pay | Admitting: Nurse Practitioner

## 2022-04-27 ENCOUNTER — Ambulatory Visit (INDEPENDENT_AMBULATORY_CARE_PROVIDER_SITE_OTHER): Payer: Medicare Other | Admitting: Nurse Practitioner

## 2022-04-27 DIAGNOSIS — I10 Essential (primary) hypertension: Secondary | ICD-10-CM

## 2022-04-27 DIAGNOSIS — R7303 Prediabetes: Secondary | ICD-10-CM

## 2022-04-27 DIAGNOSIS — E7849 Other hyperlipidemia: Secondary | ICD-10-CM

## 2022-04-27 LAB — POCT GLYCOSYLATED HEMOGLOBIN (HGB A1C): HbA1c, POC (controlled diabetic range): 6.1 % (ref 0.0–7.0)

## 2022-04-27 MED ORDER — METFORMIN HCL 500 MG PO TABS: 500.0000 mg | ORAL_TABLET | Freq: Two times a day (BID) | ORAL | 3 refills | Status: DC

## 2022-04-27 MED ORDER — SPIRONOLACTONE 25 MG PO TABS: 25.0000 mg | ORAL_TABLET | Freq: Every day | ORAL | 3 refills | Status: DC

## 2022-04-27 MED ORDER — ATORVASTATIN CALCIUM 40 MG PO TABS: 40.0000 mg | ORAL_TABLET | Freq: Every day | ORAL | 0 refills | Status: DC

## 2022-04-27 MED ORDER — CARVEDILOL 12.5 MG PO TABS: 12.5000 mg | ORAL_TABLET | Freq: Two times a day (BID) | ORAL | 3 refills | Status: DC

## 2022-04-27 MED ORDER — AMLODIPINE BESYLATE 5 MG PO TABS: 10.0000 mg | ORAL_TABLET | Freq: Every day | ORAL | 0 refills | Status: DC

## 2022-04-27 NOTE — Assessment & Plan Note (Signed)
-   carvedilol (COREG) 12.5 MG tablet; Take 1 tablet (12.5 mg total) by mouth 2 (two) times daily with a meal.  Dispense: 60 tablet; Refill: 3 - spironolactone (ALDACTONE) 25 MG tablet; Take 1 tablet (25 mg total) by mouth daily.  Dispense: 90 tablet; Refill: 3 - amLODipine (NORVASC) 5 MG tablet; Take 2 tablets (10 mg total) by mouth daily.  Dispense: 180 tablet; Refill: 0  2. Prediabetes  - metFORMIN (GLUCOPHAGE) 500 MG tablet; Take 1 tablet (500 mg total) by mouth 2 (two) times daily with a meal.  Dispense: 90 tablet; Refill: 3  3. Other hyperlipidemia  - atorvastatin (LIPITOR) 40 MG tablet; Take 1 tablet (40 mg total) by mouth daily.  Dispense: 90 tablet; Refill: 0    Follow up:  Follow up in 3 months or sooner if needed

## 2022-04-27 NOTE — Progress Notes (Signed)
Pt states she still feels burning vaginally.

## 2022-04-27 NOTE — Addendum Note (Signed)
Addended by: Gasper Lloyd on: 04/27/2022 11:37 AM   Modules accepted: Orders

## 2022-04-27 NOTE — Patient Instructions (Addendum)
1. Essential hypertension  - carvedilol (COREG) 12.5 MG tablet; Take 1 tablet (12.5 mg total) by mouth 2 (two) times daily with a meal.  Dispense: 60 tablet; Refill: 3 - spironolactone (ALDACTONE) 25 MG tablet; Take 1 tablet (25 mg total) by mouth daily.  Dispense: 90 tablet; Refill: 3 - amLODipine (NORVASC) 5 MG tablet; Take 2 tablets (10 mg total) by mouth daily.  Dispense: 180 tablet; Refill: 0  2. Prediabetes  - metFORMIN (GLUCOPHAGE) 500 MG tablet; Take 1 tablet (500 mg total) by mouth 2 (two) times daily with a meal.  Dispense: 90 tablet; Refill: 3  3. Other hyperlipidemia  - atorvastatin (LIPITOR) 40 MG tablet; Take 1 tablet (40 mg total) by mouth daily.  Dispense: 90 tablet; Refill: 0    Follow up:  Follow up in 3 months or sooner if needed

## 2022-04-27 NOTE — Progress Notes (Signed)
$'@Patient'w$  ID: Hannah Huerta, female    DOB: [birth year], 61 y.o.   MRN: [id number]  Chief Complaint  Patient presents with   Hypertension   Diabetes    Referring provider: Teena Dunk, NP   HPI  Hannah Huerta 61 y.o. female  has a past medical history of Abnormal Pap smear of cervix, Anemia, Arthritis, CAD in native artery (06/05/2015), Carpal tunnel syndrome, bilateral, Chest pain, CHF (congestive heart failure) ([place]), Chronic migraine, Diabetes mellitus without complication ([place]), GERD (gastroesophageal reflux disease), HLD (hyperlipidemia), Hypertension, Hypokalemia (37/85/8850), Metabolic syndrome (27/74/1287), Morbid obesity with BMI of 50.0-59.9, adult ([place]), Prediabetes, S/P cardiac catheterization, non obstructive disease 06/04/15 (06/05/2015), Sickle cell trait ([place]), Spondylolisthesis of lumbar region, Syncope, and Wears glasses.   Hypertension: Patient here for follow-up of elevated blood pressure. She is exercising and is adherent to low salt diet.  Blood pressure is not well controlled at home. Stopped taking lisinopril due to cough. Cardiac symptoms none. Patient denies chest pain, exertional chest pressure/discomfort, irregular heart beat, and near-syncope.  Cardiovascular risk factors: diabetes mellitus, hypertension, and obesity (BMI >= 30 kg/m2). Use of agents associated with hypertension: none. History of target organ damage: none.   Diabetes Mellitus: Patient presents for follow up of diabetes. Symptoms: none. Patient denies none.  Evaluation to date has been included: hemoglobin A1C.  Home sugars: patient does not check sugars. Treatment to date: no recent interventions.      Denies f/c/s, n/v/d, hemoptysis, PND, leg swelling Denies chest pain or edema    No Known Allergies  Immunization History  Administered Date(s) Administered   Influenza,inj,Quad PF,6+ Mos 04/28/2014, 07/25/2016   Janssen (J&J) SARS-COV-2 Vaccination 11/03/2019   Pneumococcal  Polysaccharide-23 04/28/2014, 03/01/2017   Tdap 09/15/2012    Past Medical History:  Diagnosis Date   Abnormal Pap smear of cervix    pt couldnt state what type of abnormality   Anemia    Arthritis    CAD in native artery 06/05/2015   Carpal tunnel syndrome, bilateral    Chest pain    a. 02/2012 abnl myoview - inferoapical reverisbility concerning for ischemia, EF 59%;   02/2012 nonobstructive cath   CHF (congestive heart failure) (HCC)    Chronic migraine    Diabetes mellitus without complication (HCC)    GERD (gastroesophageal reflux disease)    HLD (hyperlipidemia)    Hypertension    Hypokalemia 86/76/7209   Metabolic syndrome 47/03/6282   Morbid obesity with BMI of 50.0-59.9, adult ([place])    Prediabetes    S/P cardiac catheterization, non obstructive disease 06/04/15 06/05/2015   Sickle cell trait (HCC)    Spondylolisthesis of lumbar region    Syncope    Wears glasses     Tobacco History: Social History   Tobacco Use  Smoking Status Never   Passive exposure: Past  Smokeless Tobacco Never   Counseling given: Not Answered   Outpatient Encounter Medications as of 04/27/2022  Medication Sig   aspirin EC 81 MG tablet Take 1 tablet (81 mg total) by mouth daily.   carvedilol (COREG) 12.5 MG tablet Take 1 tablet (12.5 mg total) by mouth 2 (two) times daily with a meal.   gabapentin (NEURONTIN) 100 MG capsule TAKE ONE CAPSULE( 100 MG TOTAL) BY MOUTH AT BEDTIME FOR 180 DAY, THEN 2 CAPSULES( 200 MG TOTAL) AT BEDTIME   naproxen (NAPROSYN) 375 MG tablet Take 1 tablet (375 mg total) by mouth 2 (two) times daily.   [DISCONTINUED] amLODipine (NORVASC) 5 MG tablet Take  2 tablets (10 mg total) by mouth daily.   [DISCONTINUED] atorvastatin (LIPITOR) 40 MG tablet Take 1 tablet (40 mg total) by mouth daily.   [DISCONTINUED] carvedilol (COREG) 6.25 MG tablet Take 1 tablet (6.25 mg total) by mouth 2 (two) times daily with a meal. TAKE 1 TABLET BY MOUTH 2 TIMES DAILY WITH A MEAL.    [DISCONTINUED] lisinopril (ZESTRIL) 20 MG tablet TAKE 1 TABLET(20 MG) BY MOUTH DAILY   [DISCONTINUED] metFORMIN (GLUCOPHAGE) 500 MG tablet Take 1 tablet (500 mg total) by mouth 2 (two) times daily with a meal.   amLODipine (NORVASC) 5 MG tablet Take 2 tablets (10 mg total) by mouth daily.   atorvastatin (LIPITOR) 40 MG tablet Take 1 tablet (40 mg total) by mouth daily.   ibuprofen (ADVIL) 600 MG tablet Take 600 mg by mouth every 6 (six) hours as needed. (Patient not taking: Reported on 04/27/2022)   lidocaine (LIDODERM) 5 % Place 1 patch onto the skin daily. Remove & Discard patch within 12 hours or as directed by MD (Patient not taking: Reported on 09/22/2021)   metFORMIN (GLUCOPHAGE) 500 MG tablet Take 1 tablet (500 mg total) by mouth 2 (two) times daily with a meal.   spironolactone (ALDACTONE) 25 MG tablet Take 1 tablet (25 mg total) by mouth daily.   [DISCONTINUED] spironolactone (ALDACTONE) 25 MG tablet Take 1 tablet (25 mg total) by mouth daily.   No facility-administered encounter medications on file as of 04/27/2022.     Review of Systems  Review of Systems  Constitutional: Negative.   HENT: Negative.    Cardiovascular: Negative.   Gastrointestinal: Negative.   Allergic/Immunologic: Negative.   Neurological: Negative.   Psychiatric/Behavioral: Negative.         Physical Exam  BP (!) 154/83   Pulse 73   Temp 98 F (36.7 C) (Temporal)   Ht 5' 1.5" (1.562 m)   Wt 250 lb 9.6 oz (113.7 kg)   LMP 09/15/2013   SpO2 98%   BMI 46.58 kg/m   Wt Readings from Last 5 Encounters:  04/27/22 250 lb 9.6 oz (113.7 kg)  02/20/22 255 lb 6.4 oz (115.8 kg)  01/03/22 255 lb 12.8 oz (116 kg)  11/15/21 262 lb (118.8 kg)  10/03/21 261 lb 9.6 oz (118.7 kg)     Physical Exam Vitals and nursing note reviewed.  Constitutional:      General: She is not in acute distress.    Appearance: She is well-developed.  Cardiovascular:     Rate and Rhythm: Normal rate and regular rhythm.   Pulmonary:     Effort: Pulmonary effort is normal.     Breath sounds: Normal breath sounds.  Neurological:     Mental Status: She is alert and oriented to person, place, and time.      Assessment & Plan:   Essential hypertension - carvedilol (COREG) 12.5 MG tablet; Take 1 tablet (12.5 mg total) by mouth 2 (two) times daily with a meal.  Dispense: 60 tablet; Refill: 3 - spironolactone (ALDACTONE) 25 MG tablet; Take 1 tablet (25 mg total) by mouth daily.  Dispense: 90 tablet; Refill: 3 - amLODipine (NORVASC) 5 MG tablet; Take 2 tablets (10 mg total) by mouth daily.  Dispense: 180 tablet; Refill: 0  2. Prediabetes  - metFORMIN (GLUCOPHAGE) 500 MG tablet; Take 1 tablet (500 mg total) by mouth 2 (two) times daily with a meal.  Dispense: 90 tablet; Refill: 3  3. Other hyperlipidemia  - atorvastatin (LIPITOR) 40 MG tablet; Take  1 tablet (40 mg total) by mouth daily.  Dispense: 90 tablet; Refill: 0    Follow up:  Follow up in 3 months or sooner if needed     Fenton Foy, NP 04/27/2022

## 2022-04-28 LAB — COMPREHENSIVE METABOLIC PANEL
ALT: 7 IU/L (ref 0–32)
AST: 20 IU/L (ref 0–40)
Albumin/Globulin Ratio: 1.5 (ref 1.2–2.2)
Albumin: 4.3 g/dL (ref 3.9–4.9)
Alkaline Phosphatase: 90 IU/L (ref 44–121)
BUN/Creatinine Ratio: 17 (ref 12–28)
BUN: 15 mg/dL (ref 8–27)
Bilirubin Total: 0.2 mg/dL (ref 0.0–1.2)
CO2: 27 mmol/L (ref 20–29)
Calcium: 9.8 mg/dL (ref 8.7–10.3)
Chloride: 102 mmol/L (ref 96–106)
Creatinine, Ser: 0.86 mg/dL (ref 0.57–1.00)
Globulin, Total: 2.9 g/dL (ref 1.5–4.5)
Glucose: 106 mg/dL — ABNORMAL HIGH (ref 70–99)
Potassium: 4.1 mmol/L (ref 3.5–5.2)
Sodium: 142 mmol/L (ref 134–144)
Total Protein: 7.2 g/dL (ref 6.0–8.5)
eGFR: 77 mL/min/{1.73_m2} (ref >59)

## 2022-04-28 LAB — CBC
Hematocrit: 33.5 % — ABNORMAL LOW (ref 34.0–46.6)
Hemoglobin: 10.6 g/dL — ABNORMAL LOW (ref 11.1–15.9)
MCH: 24.9 pg — ABNORMAL LOW (ref 26.6–33.0)
MCHC: 31.6 g/dL (ref 31.5–35.7)
MCV: 79 fL (ref 79–97)
Platelets: 321 10*3/uL (ref 150–450)
RBC: 4.26 x10E6/uL (ref 3.77–5.28)
RDW: 13.9 % (ref 11.7–15.4)
WBC: 3.7 10*3/uL (ref 3.4–10.8)

## 2022-04-28 LAB — LIPID PANEL
Chol/HDL Ratio: 2.8 ratio (ref 0.0–4.4)
Cholesterol, Total: 138 mg/dL (ref 100–199)
HDL: 49 mg/dL (ref >39)
LDL Chol Calc (NIH): 77 mg/dL (ref 0–99)
Triglycerides: 55 mg/dL (ref 0–149)
VLDL Cholesterol Cal: 12 mg/dL (ref 5–40)

## 2022-05-03 ENCOUNTER — Telehealth: Payer: Self-pay

## 2022-05-03 NOTE — Patient Instructions (Addendum)
Visit Information  Thank you for taking time to visit with me today. Please don't hesitate to contact me if I can be of assistance to you.   Following are the goals we discussed today:   Goals Addressed             This Visit's Progress    COMPLETED: Care Coordination Activities       Care Coordination Interventions: Discussed care coordination program Reviewed medications with patient Encouraged to have a light on key chain for walking in areas that are dimly light Briefly discussed prediabetes and encouraged to avoid concentrated sweets and foods that raise blood sugar fast, processed foods. Offered to send education on Prediabetes-patient declined Encouraged to take blood pressure at home and take to provider visits Encouraged patient to contact care coordinator or primary care provider if care coordination needs change in the future       If you are experiencing a Mental Health or [place] or need someone to talk to, please   The patient verbalized understanding of instructions, educational materials, and care plan provided today and DECLINED offer to receive copy of patient instructions, educational materials, and care plan.   Thea Silversmith, RN, MSN, BSN, [place] Coordinator [phone]

## 2022-05-03 NOTE — Patient Outreach (Signed)
  Care Coordination   Initial Visit Note   05/03/2022 Name: Hannah Huerta MRN: [id number] DOB: [birth year]  Hannah Huerta is a 61 y.o. year old female who sees [place], Jake Church I, NP for primary care. I spoke with  [name] by phone today.  What matters to the patients health and wellness today?  Patient reports fall this past September, where she fractured foot. She steps was walking in low lighted area and stepped on "one of those balls that fall from the tree". She reports it is being managed by Podiatry. Patient denies any care coordination, disease management or resource needs at this time.    Goals Addressed             This Visit's Progress    COMPLETED: Care Coordination Activities       Care Coordination Interventions: Discussed care coordination program Reviewed medications with patient Encouraged to have a light on key chain for walking in areas that are dimly light Briefly discussed prediabetes and encouraged to avoid concentrated sweets and foods that raise blood sugar fast, processed foods. Offered to send education on Prediabetes-patient declined Encouraged to take blood pressure at home and take to provider visits Encouraged patient to contact care coordinator or primary care provider if care coordination needs change in the future       SDOH assessments and interventions completed:  Yes  SDOH Interventions Today    Flowsheet Row Most Recent Value  SDOH Interventions   Food Insecurity Interventions Intervention Not Indicated  Housing Interventions Intervention Not Indicated  Transportation Interventions Intervention Not Indicated  Utilities Interventions Intervention Not Indicated     Care Coordination Interventions Activated:  Yes  Care Coordination Interventions:  Yes, provided   Follow up plan: No further intervention required.   Encounter Outcome:  Pt. Visit Completed   Thea Silversmith, RN, MSN, BSN, [place] Coordinator [phone]

## 2022-05-04 ENCOUNTER — Ambulatory Visit (INDEPENDENT_AMBULATORY_CARE_PROVIDER_SITE_OTHER): Payer: Medicare Other | Admitting: Podiatry

## 2022-05-04 ENCOUNTER — Encounter: Payer: Self-pay | Admitting: Podiatry

## 2022-05-04 ENCOUNTER — Ambulatory Visit (INDEPENDENT_AMBULATORY_CARE_PROVIDER_SITE_OTHER): Payer: Medicare Other

## 2022-05-04 DIAGNOSIS — R600 Localized edema: Secondary | ICD-10-CM

## 2022-05-04 DIAGNOSIS — S92351G Displaced fracture of fifth metatarsal bone, right foot, subsequent encounter for fracture with delayed healing: Secondary | ICD-10-CM | POA: Diagnosis not present

## 2022-05-04 DIAGNOSIS — S92351A Displaced fracture of fifth metatarsal bone, right foot, initial encounter for closed fracture: Secondary | ICD-10-CM | POA: Diagnosis not present

## 2022-05-04 NOTE — Progress Notes (Signed)
Subjective:   Patient ID: Hannah Huerta, female   DOB: 61 y.o.   MRN: [id number]   HPI Patient presents stating that she is still having pain in her right foot but it has moderated but she has a lot of swelling which is bothersome   ROS      Objective:  Physical Exam  Neurovascular status found to be intact negative Bevelyn Buckles' sign was noted with patient's right foot showing swelling of the +2 nature of the midfoot into the rear foot +1 around the ankle.  Patient still has discomfort in the outside of the foot but it is moderate from previous visit     Assessment:  Fracture of the base of the fifth metatarsal right which shows signs of healing but still has significant swelling pathology     Plan:  H&P educated her on deformity reviewed her x-rays discussed possibility for either removal of bone on the side or possible fixation even though the piece of small that would be difficult.  Hoping that will heal on its own will not require this and due to the excessive swelling I applied Unna boot Ace wrap today and then dispensed ankle compression stocking and advised on elevation ice therapy and reappoint 4 weeks.  All questions answered  X-rays indicate that there is a separation of the base of the fifth metatarsal with previous fracture occurred I am hoping it will eventually absorb or not be pathological for the patient

## 2022-06-01 ENCOUNTER — Ambulatory Visit (INDEPENDENT_AMBULATORY_CARE_PROVIDER_SITE_OTHER): Payer: Medicare Other | Admitting: Podiatry

## 2022-06-01 ENCOUNTER — Encounter: Payer: Self-pay | Admitting: Podiatry

## 2022-06-01 ENCOUNTER — Ambulatory Visit (INDEPENDENT_AMBULATORY_CARE_PROVIDER_SITE_OTHER): Payer: Medicare Other

## 2022-06-01 DIAGNOSIS — S92351A Displaced fracture of fifth metatarsal bone, right foot, initial encounter for closed fracture: Secondary | ICD-10-CM

## 2022-06-01 DIAGNOSIS — S92351G Displaced fracture of fifth metatarsal bone, right foot, subsequent encounter for fracture with delayed healing: Secondary | ICD-10-CM

## 2022-06-01 NOTE — Progress Notes (Signed)
Subjective:   Patient ID: Hannah Huerta, female   DOB: 61 y.o.   MRN: [id number]   HPI Patient states the pain seems to be reducing still present with mild swelling but better than it was   ROS      Objective:  Physical Exam  Neuro vas scaler status intact with inflammation pain of the right lateral foot secondary to fifth base fracture which is improving still painful patient wearing surgical shoe     Assessment:  Improvement occurring lateral side right but still moderately painful     Plan:  H&P x-rays reviewed.  Discussed the possibility at 1 point for excision of bone formation right fifth metatarsal but at this point were just get a watch it and I advised on continued surgical shoe usage compression therapy ice therapy as needed and gradual return to soft shoe gear.  If symptoms persisting in the next 4 weeks I want to see her back  X-rays indicate there is a small fracture of the base of the fifth that would be impossible to fixate and appears to be absorbing but hopefully will not require excision in the future

## 2022-06-12 ENCOUNTER — Telehealth: Payer: Self-pay | Admitting: Pharmacist

## 2022-06-12 NOTE — Progress Notes (Signed)
Attempted to contact patient to discuss elevated blood pressure at last office visit. Left HIPAA compliant message for patient to return my call at their convenience.   Catie Hedwig Morton, PharmD, [facility] [phone]

## 2022-07-01 ENCOUNTER — Other Ambulatory Visit: Payer: Self-pay | Admitting: Nurse Practitioner

## 2022-07-01 DIAGNOSIS — I10 Essential (primary) hypertension: Secondary | ICD-10-CM

## 2022-07-28 ENCOUNTER — Other Ambulatory Visit: Payer: Self-pay | Admitting: Nurse Practitioner

## 2022-07-28 ENCOUNTER — Ambulatory Visit: Payer: Self-pay | Admitting: Nurse Practitioner

## 2022-07-28 ENCOUNTER — Telehealth: Payer: Self-pay | Admitting: Nurse Practitioner

## 2022-07-28 DIAGNOSIS — I10 Essential (primary) hypertension: Secondary | ICD-10-CM

## 2022-07-28 MED ORDER — CARVEDILOL 12.5 MG PO TABS: 12.5000 mg | ORAL_TABLET | Freq: Two times a day (BID) | ORAL | 3 refills | Status: DC

## 2022-07-28 NOTE — Telephone Encounter (Signed)
Please send new prescription for carvedilol to [facility] on [address]

## 2022-07-31 ENCOUNTER — Other Ambulatory Visit: Payer: Self-pay

## 2022-07-31 DIAGNOSIS — I10 Essential (primary) hypertension: Secondary | ICD-10-CM

## 2022-07-31 MED ORDER — CARVEDILOL 12.5 MG PO TABS: 12.5000 mg | ORAL_TABLET | Freq: Two times a day (BID) | ORAL | 3 refills | Status: DC

## 2022-07-31 NOTE — Telephone Encounter (Signed)
Done KH 

## 2022-08-02 ENCOUNTER — Other Ambulatory Visit: Payer: Self-pay | Admitting: Nurse Practitioner

## 2022-08-02 DIAGNOSIS — I10 Essential (primary) hypertension: Secondary | ICD-10-CM

## 2022-08-04 ENCOUNTER — Encounter: Payer: Self-pay | Admitting: Nurse Practitioner

## 2022-08-04 ENCOUNTER — Ambulatory Visit (INDEPENDENT_AMBULATORY_CARE_PROVIDER_SITE_OTHER): Payer: Medicare Other | Admitting: Nurse Practitioner

## 2022-08-04 VITALS — BP 139/82 | HR 72 | Temp 97.3°F | Ht 60.5 in | Wt 247.6 lb

## 2022-08-04 DIAGNOSIS — E119 Type 2 diabetes mellitus without complications: Secondary | ICD-10-CM | POA: Diagnosis not present

## 2022-08-04 DIAGNOSIS — R300 Dysuria: Secondary | ICD-10-CM

## 2022-08-04 DIAGNOSIS — I10 Essential (primary) hypertension: Secondary | ICD-10-CM | POA: Diagnosis not present

## 2022-08-04 DIAGNOSIS — R7303 Prediabetes: Secondary | ICD-10-CM | POA: Diagnosis not present

## 2022-08-04 DIAGNOSIS — E7849 Other hyperlipidemia: Secondary | ICD-10-CM

## 2022-08-04 LAB — POCT GLYCOSYLATED HEMOGLOBIN (HGB A1C)
HbA1c POC (<> result, manual entry): 5.9 % (ref 4.0–5.6)
HbA1c, POC (controlled diabetic range): 5.9 % (ref 0.0–7.0)
HbA1c, POC (prediabetic range): 5.9 % (ref 5.7–6.4)
Hemoglobin A1C: 5.9 % — AB (ref 4.0–5.6)

## 2022-08-04 LAB — POCT URINALYSIS DIPSTICK
Bilirubin, UA: NEGATIVE
Glucose, UA: NEGATIVE
Ketones, UA: NEGATIVE
Leukocytes, UA: NEGATIVE
Nitrite, UA: NEGATIVE
Protein, UA: NEGATIVE
Spec Grav, UA: 1.02 (ref 1.010–1.025)
Urobilinogen, UA: 0.2 E.U./dL
pH, UA: 5.5 (ref 5.0–8.0)

## 2022-08-04 MED ORDER — SPIRONOLACTONE 25 MG PO TABS: 25.0000 mg | ORAL_TABLET | Freq: Every day | ORAL | 3 refills | Status: DC

## 2022-08-04 MED ORDER — ATORVASTATIN CALCIUM 40 MG PO TABS: 40.0000 mg | ORAL_TABLET | Freq: Every day | ORAL | 0 refills | Status: DC

## 2022-08-04 MED ORDER — METFORMIN HCL 500 MG PO TABS: 500.0000 mg | ORAL_TABLET | Freq: Two times a day (BID) | ORAL | 3 refills | Status: DC

## 2022-08-04 NOTE — Addendum Note (Signed)
Addended by: Angus Seller on: 08/04/2022 10:52 AM   Modules accepted: Orders

## 2022-08-04 NOTE — Progress Notes (Signed)
$'@Patient'f$  ID: Hannah Huerta, female    DOB: 07-11-1961, 62 y.o.   MRN: [id number]  Chief Complaint  Patient presents with   Follow-up   Dysuria    Happens every now and again     Referring provider: Teena Dunk, NP   HPI  Hannah Huerta 62 y.o. female  has a past medical history of Abnormal Pap smear of cervix, Anemia, Arthritis, CAD in native artery (06/05/2015), Carpal tunnel syndrome, bilateral, Chest pain, CHF (congestive heart failure) ([place]), Chronic migraine, Diabetes mellitus without complication ([place]), GERD (gastroesophageal reflux disease), HLD (hyperlipidemia), Hypertension, Hypokalemia (29/51/8841), Metabolic syndrome (66/12/3014), Morbid obesity with BMI of 50.0-59.9, adult ([place]), Prediabetes, S/P cardiac catheterization, non obstructive disease 06/04/15 (06/05/2015), Sickle cell trait ([place]), Spondylolisthesis of lumbar region, Syncope, and Wears glasses.   Hypertension: Patient here for follow-up of elevated blood pressure. She is exercising and is adherent to low salt diet.  Blood pressure is not well controlled at home. Stopped taking lisinopril due to cough. Cardiac symptoms none. Patient denies chest pain, exertional chest pressure/discomfort, irregular heart beat, and near-syncope.  Cardiovascular risk factors: diabetes mellitus, hypertension, and obesity (BMI >= 30 kg/m2). Use of agents associated with hypertension: none. History of target organ damage: none.   Diabetes Mellitus: Patient presents for follow up of diabetes. Symptoms: none. Patient denies none.  Evaluation to date has been included: hemoglobin A1C.  Home sugars: patient does not check sugars. Treatment to date: no recent interventions.    Having dysuria will check UA today.    Denies f/c/s, n/v/d, hemoptysis, PND, leg swelling Denies chest pain or edema    No Known Allergies  Immunization History  Administered Date(s) Administered   Influenza,inj,Quad PF,6+ Mos 04/28/2014, 07/25/2016    Janssen (J&J) SARS-COV-2 Vaccination 11/03/2019   Pneumococcal Polysaccharide-23 04/28/2014, 03/01/2017   Tdap 09/15/2012    Past Medical History:  Diagnosis Date   Abnormal Pap smear of cervix    pt couldnt state what type of abnormality   Anemia    Arthritis    CAD in native artery 06/05/2015   Carpal tunnel syndrome, bilateral    Chest pain    a. 02/2012 abnl myoview - inferoapical reverisbility concerning for ischemia, EF 59%;   02/2012 nonobstructive cath   CHF (congestive heart failure) (HCC)    Chronic migraine    Diabetes mellitus without complication (HCC)    GERD (gastroesophageal reflux disease)    HLD (hyperlipidemia)    Hypertension    Hypokalemia 07/25/3233   Metabolic syndrome 57/32/2025   Morbid obesity with BMI of 50.0-59.9, adult ([place])    Prediabetes    S/P cardiac catheterization, non obstructive disease 06/04/15 06/05/2015   Sickle cell trait (HCC)    Spondylolisthesis of lumbar region    Syncope    Wears glasses     Tobacco History: Social History   Tobacco Use  Smoking Status Never   Passive exposure: Past  Smokeless Tobacco Never   Counseling given: Not Answered   Outpatient Encounter Medications as of 08/04/2022  Medication Sig   amLODipine (NORVASC) 5 MG tablet TAKE 2 TABLETS(10 MG) BY MOUTH DAILY   aspirin EC 81 MG tablet Take 1 tablet (81 mg total) by mouth daily.   carvedilol (COREG) 12.5 MG tablet Take 1 tablet (12.5 mg total) by mouth 2 (two) times daily with a meal.   gabapentin (NEURONTIN) 100 MG capsule TAKE ONE CAPSULE( 100 MG TOTAL) BY MOUTH AT BEDTIME FOR 180 DAY, THEN 2 CAPSULES( 200 MG TOTAL)  AT BEDTIME   ibuprofen (ADVIL) 600 MG tablet Take 600 mg by mouth every 6 (six) hours as needed.   naproxen (NAPROSYN) 375 MG tablet Take 1 tablet (375 mg total) by mouth 2 (two) times daily.   [DISCONTINUED] atorvastatin (LIPITOR) 40 MG tablet Take 1 tablet (40 mg total) by mouth daily.   [DISCONTINUED] metFORMIN (GLUCOPHAGE) 500 MG tablet  Take 1 tablet (500 mg total) by mouth 2 (two) times daily with a meal.   [DISCONTINUED] spironolactone (ALDACTONE) 25 MG tablet Take 1 tablet (25 mg total) by mouth daily.   atorvastatin (LIPITOR) 40 MG tablet Take 1 tablet (40 mg total) by mouth daily.   lidocaine (LIDODERM) 5 % Place 1 patch onto the skin daily. Remove & Discard patch within 12 hours or as directed by MD (Patient not taking: Reported on 09/22/2021)   metFORMIN (GLUCOPHAGE) 500 MG tablet Take 1 tablet (500 mg total) by mouth 2 (two) times daily with a meal.   spironolactone (ALDACTONE) 25 MG tablet Take 1 tablet (25 mg total) by mouth daily.   No facility-administered encounter medications on file as of 08/04/2022.     Review of Systems  Review of Systems  Constitutional: Negative.   HENT: Negative.    Cardiovascular: Negative.   Gastrointestinal: Negative.   Genitourinary:  Positive for dysuria.  Allergic/Immunologic: Negative.   Neurological: Negative.   Psychiatric/Behavioral: Negative.         Physical Exam  BP 139/82   Pulse 72   Temp (!) 97.3 F (36.3 C) (Temporal)   Ht 5' 0.5" (1.537 m)   Wt 247 lb 9.6 oz (112.3 kg)   LMP 09/15/2013   SpO2 100%   BMI 47.56 kg/m   Wt Readings from Last 5 Encounters:  08/04/22 247 lb 9.6 oz (112.3 kg)  04/27/22 250 lb 9.6 oz (113.7 kg)  02/20/22 255 lb 6.4 oz (115.8 kg)  01/03/22 255 lb 12.8 oz (116 kg)  11/15/21 262 lb (118.8 kg)     Physical Exam Vitals and nursing note reviewed.  Constitutional:      General: She is not in acute distress.    Appearance: She is well-developed.  Cardiovascular:     Rate and Rhythm: Normal rate and regular rhythm.  Pulmonary:     Effort: Pulmonary effort is normal.     Breath sounds: Normal breath sounds.  Neurological:     Mental Status: She is alert and oriented to person, place, and time.      Lab Results:  CBC    Component Value Date/Time   WBC 3.7 04/27/2022 1124   WBC 9.5 02/06/2020 0809   RBC 4.26  04/27/2022 1124   RBC 3.67 (L) 02/06/2020 0809   HGB 10.6 (L) 04/27/2022 1124   HCT 33.5 (L) 04/27/2022 1124   PLT 321 04/27/2022 1124   MCV 79 04/27/2022 1124   MCH 24.9 (L) 04/27/2022 1124   MCH 24.8 (L) 02/06/2020 0809   MCHC 31.6 04/27/2022 1124   MCHC 31.1 02/06/2020 0809   RDW 13.9 04/27/2022 1124   LYMPHSABS 1.1 10/27/2020 0937   MONOABS 0.6 02/05/2019 0127   EOSABS 0.2 10/27/2020 0937   BASOSABS 0.0 10/27/2020 0937    BMET    Component Value Date/Time   NA 142 04/27/2022 1124   K 4.1 04/27/2022 1124   CL 102 04/27/2022 1124   CO2 27 04/27/2022 1124   GLUCOSE 106 (H) 04/27/2022 1124   GLUCOSE 147 (H) 02/06/2020 0809   BUN 15 04/27/2022 1124  CREATININE 0.86 04/27/2022 1124   CREATININE 0.99 03/07/2017 1109   CALCIUM 9.8 04/27/2022 1124   GFRNONAA 67 03/10/2020 1012   GFRNONAA 64 03/07/2017 1109   GFRAA 77 03/10/2020 1012   GFRAA 74 03/07/2017 1109    BNP    Component Value Date/Time   BNP 32.6 06/03/2018 1558    ProBNP    Component Value Date/Time   PROBNP 11.9 10/31/2013 1707    Imaging: No results found.   Assessment & Plan:   Type 2 diabetes mellitus without complication, without long-term current use of insulin (HCC) - Ambulatory referral to Podiatry - Microalbumin/Creatinine Ratio, Urine - POCT glycosylated hemoglobin (Hb A1C)  2. Other hyperlipidemia  - atorvastatin (LIPITOR) 40 MG tablet; Take 1 tablet (40 mg total) by mouth daily.  Dispense: 90 tablet; Refill: 0  3. Prediabetes  - metFORMIN (GLUCOPHAGE) 500 MG tablet; Take 1 tablet (500 mg total) by mouth 2 (two) times daily with a meal.  Dispense: 90 tablet; Refill: 3  4. Essential hypertension  - spironolactone (ALDACTONE) 25 MG tablet; Take 1 tablet (25 mg total) by mouth daily.  Dispense: 90 tablet; Refill: 3  Follow up:  Follow up in 3 months     Fenton Foy, NP 08/04/2022

## 2022-08-04 NOTE — Assessment & Plan Note (Signed)
-  Ambulatory referral to Podiatry - Microalbumin/Creatinine Ratio, Urine - POCT glycosylated hemoglobin (Hb A1C)  2. Other hyperlipidemia  - atorvastatin (LIPITOR) 40 MG tablet; Take 1 tablet (40 mg total) by mouth daily.  Dispense: 90 tablet; Refill: 0  3. Prediabetes  - metFORMIN (GLUCOPHAGE) 500 MG tablet; Take 1 tablet (500 mg total) by mouth 2 (two) times daily with a meal.  Dispense: 90 tablet; Refill: 3  4. Essential hypertension  - spironolactone (ALDACTONE) 25 MG tablet; Take 1 tablet (25 mg total) by mouth daily.  Dispense: 90 tablet; Refill: 3  Follow up:  Follow up in 3 months

## 2022-08-04 NOTE — Patient Instructions (Signed)
1. Type 2 diabetes mellitus without complication, without long-term current use of insulin ([place])  - Ambulatory referral to Podiatry - Microalbumin/Creatinine Ratio, Urine - POCT glycosylated hemoglobin (Hb A1C)  2. Other hyperlipidemia  - atorvastatin (LIPITOR) 40 MG tablet; Take 1 tablet (40 mg total) by mouth daily.  Dispense: 90 tablet; Refill: 0  3. Prediabetes  - metFORMIN (GLUCOPHAGE) 500 MG tablet; Take 1 tablet (500 mg total) by mouth 2 (two) times daily with a meal.  Dispense: 90 tablet; Refill: 3  4. Essential hypertension  - spironolactone (ALDACTONE) 25 MG tablet; Take 1 tablet (25 mg total) by mouth daily.  Dispense: 90 tablet; Refill: 3  Follow up:  Follow up in 3 months

## 2022-08-06 LAB — MICROALBUMIN / CREATININE URINE RATIO
Creatinine, Urine: 92.9 mg/dL
Microalb/Creat Ratio: 31 mg/g creat — ABNORMAL HIGH (ref 0–29)
Microalbumin, Urine: 28.9 ug/mL

## 2022-08-08 ENCOUNTER — Ambulatory Visit (INDEPENDENT_AMBULATORY_CARE_PROVIDER_SITE_OTHER): Payer: 59 | Admitting: Podiatry

## 2022-08-08 VITALS — BP 134/67

## 2022-08-08 DIAGNOSIS — M2142 Flat foot [pes planus] (acquired), left foot: Secondary | ICD-10-CM

## 2022-08-08 DIAGNOSIS — M2141 Flat foot [pes planus] (acquired), right foot: Secondary | ICD-10-CM

## 2022-08-08 DIAGNOSIS — M21371 Foot drop, right foot: Secondary | ICD-10-CM | POA: Diagnosis not present

## 2022-08-08 DIAGNOSIS — E119 Type 2 diabetes mellitus without complications: Secondary | ICD-10-CM

## 2022-08-08 NOTE — Progress Notes (Unsigned)
ANNUAL DIABETIC FOOT EXAM  Subjective: LESLY PONTARELLI presents today for annual diabetic foot examination.  Chief Complaint  Patient presents with   Nail Problem    Prediabetic A1C-5.9 PCP-Nichols,Tonya PCP VST-08/04/2022    Patient confirms h/o diabetes.  Patient relates {Numbers; 0-100:15068} year h/o diabetes.  Patient denies any h/o foot wounds.  Patient has h/o foot ulcer of {jgPodToeLocator:23637}, which healed via help of ***.  Patient has h/o amputation(s): {jgamp:23617}.  Patient admits symptoms of foot numbness.   Patient admits symptoms of foot tingling.  Patient admits symptoms of burning in feet.  Patient admits symptoms of pins/needles sensation in feet.  Patient denies any numbness, tingling, burning, or pins/needle sensation in feet.  Patient has been diagnosed with neuropathy and it is managed with {JGNEUROPATHYMEDS:27053}.  Patient's blood sugar was *** mg/dl {Time; today/yesterday/ 2 days ago:19188}. Last known  HgA1c was ***%   Patient did not check blood glucose this morning.  Patient does not monitor blood glucose daily.  Risk factors: {jgriskfactors:24044}.  Fenton Foy, NP is patient's PCP. Last visit was {Time; dates multiple:15870}***.  Past Medical History:  Diagnosis Date   Abnormal Pap smear of cervix    pt couldnt state what type of abnormality   Anemia    Arthritis    CAD in native artery 06/05/2015   Carpal tunnel syndrome, bilateral    Chest pain    a. 02/2012 abnl myoview - inferoapical reverisbility concerning for ischemia, EF 59%;   02/2012 nonobstructive cath   CHF (congestive heart failure) (HCC)    Chronic migraine    Diabetes mellitus without complication (HCC)    GERD (gastroesophageal reflux disease)    HLD (hyperlipidemia)    Hypertension    Hypokalemia 79/09/8331   Metabolic syndrome 83/29/1916   Morbid obesity with BMI of 50.0-59.9, adult ([place])    Prediabetes    S/P cardiac catheterization, non obstructive  disease 06/04/15 06/05/2015   Sickle cell trait ([place])    Spondylolisthesis of lumbar region    Syncope    Wears glasses    Patient Active Problem List   Diagnosis Date Noted   Type 2 diabetes mellitus without complication, without long-term current use of insulin ([place]) 08/04/2022   Medial orbital wall fracture, closed, initial encounter ([place]) 10/03/2021   Bilateral carpal tunnel syndrome 09/22/2021   Rheumatoid factor positive 09/22/2021   Bilateral hand swelling 09/22/2021   Spondylolisthesis 05/06/2021   Uncontrolled type 2 diabetes mellitus with microalbuminuria 03/28/2021   Foot-drop 01/04/2021   Status post lumbar spinal fusion 02/12/2020   Spondylolisthesis of lumbar region 02/05/2020   Arthropathy of lumbar facet joint 12/11/2019   Chronic right-sided low back pain with right-sided sciatica 11/04/2019   Other chronic pain 11/04/2019   DDD (degenerative disc disease), lumbar 11/04/2019   Essential hypertension 11/04/2019   Body mass index (BMI) 50.0-59.9, adult ([place]) 11/04/2019   Spinal stenosis of lumbar region 11/04/2019   Spondylolisthesis, lumbar region 11/04/2019   Vitamin D deficiency 09/10/2019   CHF (congestive heart failure) ([place]) 01/30/2018   OSA (obstructive sleep apnea) 03/01/2017   ARF (acute renal failure) ([place]) 02/28/2017   Hyponatremia 02/28/2017   Syncope 07/23/2016   Syncope and collapse 07/23/2016   S/P cardiac catheterization, non obstructive disease 06/04/15 06/05/2015   CAD in native artery 06/05/2015   Fever 06/05/2015   Hypokalemia 60/60/0459   Metabolic syndrome 97/74/1423   Chest pain, neg MI, possible GI 06/04/2015   Colon cancer screening 08/05/2014   Severe obesity (BMI >= 40) (  [place]) 08/05/2014   Health care maintenance 05/13/2014   Prediabetes 05/13/2014   Chronic migraine 04/27/2014   Atypical chest pain 04/27/2014   SOB (shortness of breath) 09/16/2013   Palpitations 09/16/2013   HTN (hypertension) 03/09/2012   Hyperlipidemia  03/09/2012   Microcytic anemia    Morbid obesity with BMI of 50.0-59.9, adult ([place])    Precordial chest pain 03/06/2012   Dizziness 03/06/2012   Past Surgical History:  Procedure Laterality Date   BACK SURGERY     x1 lumbar fusion   CARDIAC CATHETERIZATION N/A 06/04/2015   Procedure: Left Heart Cath and Coronary Angiography;  Surgeon: [name], MD;  Location: [facility];  Service: Cardiovascular;  Laterality: N/A;   CHOLECYSTECTOMY     laparoscopic   COLONOSCOPY WITH PROPOFOL N/A 09/04/2014   Procedure: COLONOSCOPY WITH PROPOFOL;  Surgeon: [name], MD;  Location: [facility];  Service: Endoscopy;  Laterality: N/A;   LEFT HEART CATHETERIZATION WITH CORONARY ANGIOGRAM N/A 03/08/2012   Procedure: LEFT HEART CATHETERIZATION WITH CORONARY ANGIOGRAM;  Surgeon: Hillary Bow, MD;  Location: [facility];  Service: Cardiovascular;  Laterality: N/A;   SPINE SURGERY     fusion   TUBAL LIGATION     Current Outpatient Medications on File Prior to Visit  Medication Sig Dispense Refill   amLODipine (NORVASC) 5 MG tablet TAKE 2 TABLETS(10 MG) BY MOUTH DAILY 180 tablet 0   aspirin EC 81 MG tablet Take 1 tablet (81 mg total) by mouth daily. 30 tablet 11   atorvastatin (LIPITOR) 40 MG tablet Take 1 tablet (40 mg total) by mouth daily. 90 tablet 0   carvedilol (COREG) 12.5 MG tablet Take 1 tablet (12.5 mg total) by mouth 2 (two) times daily with a meal. 60 tablet 3   gabapentin (NEURONTIN) 100 MG capsule TAKE ONE CAPSULE( 100 MG TOTAL) BY MOUTH AT BEDTIME FOR 180 DAY, THEN 2 CAPSULES( 200 MG TOTAL) AT BEDTIME 60 capsule 3   ibuprofen (ADVIL) 600 MG tablet Take 600 mg by mouth every 6 (six) hours as needed.     lidocaine (LIDODERM) 5 % Place 1 patch onto the skin daily. Remove & Discard patch within 12 hours or as directed by MD (Patient not taking: Reported on 09/22/2021) 30 patch 0   metFORMIN (GLUCOPHAGE) 500 MG tablet Take 1 tablet (500 mg total) by mouth 2 (two) times daily with a  meal. 90 tablet 3   naproxen (NAPROSYN) 375 MG tablet Take 1 tablet (375 mg total) by mouth 2 (two) times daily. 20 tablet 0   spironolactone (ALDACTONE) 25 MG tablet Take 1 tablet (25 mg total) by mouth daily. 90 tablet 3   No current facility-administered medications on file prior to visit.    No Known Allergies Social History   Occupational History   Occupation: Works in the Estate manager/land agent: [employer] Use   Smoking status: Never    Passive exposure: Past   Smokeless tobacco: Never  Vaping Use   Vaping Use: Never used  Substance and Sexual Activity   Alcohol use: No   Drug use: No   Sexual activity: Not Currently    Birth control/protection: Surgical   Family History  Problem Relation Age of Onset   Coronary artery disease Father 80   Heart disease Father    Other Father        died in a MVA   Hypertension Father    Sudden death Mother 69  Questionable MI   Diabetes Mother    Hypertension Mother    Hypertension Sister    Pancreatic cancer Maternal Aunt    Breast cancer Maternal Aunt        after age 59   Stroke Paternal Grandmother    Colon polyps Sister    Colon cancer Neg Hx    Gallbladder disease Neg Hx    Esophageal cancer Neg Hx    Immunization History  Administered Date(s) Administered   Influenza,inj,Quad PF,6+ Mos 04/28/2014, 07/25/2016   Janssen (J&J) SARS-COV-2 Vaccination 11/03/2019   Pneumococcal Polysaccharide-23 04/28/2014, 03/01/2017   Tdap 09/15/2012     Review of Systems: Negative except as noted in the HPI.   Objective: Vitals:   08/08/22 0833  BP: 134/67    DELYNN OLVERA is a pleasant 62 y.o. female in NAD. AAO X 3.  Vascular Examination: {jgvascular:23595}  Dermatological Examination: {jgderm:23598}  Neurological Examination: {jgneuro:23601::"Protective sensation intact 5/5 intact bilaterally with 10g monofilament b/l.","Vibratory sensation intact b/l.","Proprioception intact bilaterally."}  Musculoskeletal  Examination: {jgmsk:23600}  Footwear Assessment: Does the patient wear appropriate shoes? {Yes,No}. Does the patient need inserts/orthotics? {Yes,No}.  ADA Risk Categorization: Low Risk :  Patient has all of the following: Intact protective sensation No prior foot ulcer  No severe deformity Pedal pulses present  High Risk  Patient has one or more of the following: Loss of protective sensation Absent pedal pulses Severe Foot deformity History of foot ulcer  Assessment: 1. Acquired right foot drop   2. Pes planus of both feet   3. Type 2 diabetes mellitus without complication, without long-term current use of insulin ([place])   4. Encounter for diabetic foot exam ([place])     Plan: Orders Placed This Encounter  Procedures   PR AFO PLASTIC MOLDED W/ANKLE J    To [facility]: Dispense one AFO RLE with ankle dorsiflexion for drop foot.   PR AFO PLASTIC MOLDED W/ANKLE J  {jgplan:23602::"-Patient/POA to call should there be question/concern in the interim."} No follow-ups on file.  Marzetta Board, DPM

## 2022-08-09 ENCOUNTER — Ambulatory Visit (INDEPENDENT_AMBULATORY_CARE_PROVIDER_SITE_OTHER): Payer: 59 | Admitting: Podiatry

## 2022-08-09 ENCOUNTER — Ambulatory Visit (INDEPENDENT_AMBULATORY_CARE_PROVIDER_SITE_OTHER): Payer: 59

## 2022-08-09 DIAGNOSIS — M779 Enthesopathy, unspecified: Secondary | ICD-10-CM | POA: Diagnosis not present

## 2022-08-09 DIAGNOSIS — M21371 Foot drop, right foot: Secondary | ICD-10-CM | POA: Diagnosis not present

## 2022-08-09 DIAGNOSIS — S92351A Displaced fracture of fifth metatarsal bone, right foot, initial encounter for closed fracture: Secondary | ICD-10-CM

## 2022-08-09 NOTE — Progress Notes (Signed)
Subjective:   Patient ID: Hannah Huerta, female   DOB: 62 y.o.   MRN: [id number]   HPI Patient states her right foot fracture seem to be doing really well but a few days ago she stepped wrong and it started to hurt but it is hurting more on top of the foot and does have foot drop   ROS      Objective:  Physical Exam  Neurovascular status intact obesity noted with loss of anterior tibial tendon right with patient found to have inflammation fluid buildup around the dorsal of the foot right with minimal discomfort base of fifth metatarsal with previous fracture was present     Assessment:  Foot drop right and cannot rule out pathology associated with dorsal swelling     Plan:  H&P x-rays reviewed and discussed elevation compression as needed but it should heal uneventfully and then ultimately discussed foot drop and patient will have a brace made to try to support the foot and more consistent fashion  X-rays do indicate that there is some resorption of the base of the fifth metatarsal where the fracture occurred and we do not see pathology there and it is not where she is feeling pain now.  The rest of the foot looks good no indication Lisfranc's injury current

## 2022-08-10 ENCOUNTER — Encounter: Payer: Self-pay | Admitting: Podiatry

## 2022-09-25 NOTE — Progress Notes (Signed)
Office Visit Note  Patient: Hannah Huerta             Date of Birth: 03/03/1961           MRN: [id number]             PCP: Ivonne Andrew, NP Referring: Ivonne Andrew, NP Visit Date: 09/26/2022   Subjective:  Pain and Edema of the Left Wrist, Pain and Edema of the Right Wrist, and Follow-up (Patient states she has bilateral wrist pain.)   History of Present Illness: Hannah Huerta is a 62 y.o. female here for follow up for bilateral carpal tunnel syndrome previously treated with steroid injections in May last year. Symptoms worsening again for about 3 months, left side worse than right. Numbness in fingertips is returned and sometimes pain shoots up the arm. She notices swelling at the wrist no visible changes in her fingers. Driving worsens symptoms she has to rest her hands at the base of the steering wheel to avoid wrist extension.   Previous HPI 02/20/22 Hannah Huerta is a 62 y.o. female here for follow up bilateral hand pains consistent with CTS and possible RA. She felt a large improvement 1 day after wrist injections last visit and continues to do better. She is able to grip steering wheel normally and does not wake at night from wrist pain any more. Her right ankle has some pain and swelling worse later in the day, this is typical for her especially in the summer. She still has morning stiffness in multiple areas lasting only a few minutes.   Previous HPI 11/18/2021 Hannah Huerta is a 62 y.o. female here for follow up for bilateral hand pains consistent with CTS and possible RA. She continues to have similar amount of wrist pain and swelling since our last visit. No appreciable benefit with conservative treatment of stretching or bracing wrist in neutral position.   09/22/21 Hannah Huerta is a 62 y.o. female here for evaluation for possible rheumatoid arthritis. Her history includes T2DM, migraine, CAD, CHF, and chronic low back pain with DJD and right sided sciatica. She has joint  pain in multiple areas treatments including acetaminophen, topical diclofenac, gabapentin, naproxen although avoiding long term NSAIDs due to renal function.  He has had trouble due to right sided foot drop ongoing since about the past 4 years.  This is thought to be from residual damage due to significant lumbar spinal stenosis now status post surgical fusion.  Swelling and pain in this foot is worse in the past 1 year.  She is also having trouble with bilateral hands with pain and swelling also reports hand numbness that is worse at night mornings and when driving long distances.  She had previous evaluation with the symptoms in 2020 showing positive rheumatoid factor and CCP antibodies. She was never definitively diagnosed or on any disease specific treatments for RA.   Labs reviewed 08/2018 RF 57.4 CCP 168 ANA neg   Review of Systems  Constitutional:  Negative for fatigue.  HENT:  Negative for mouth sores and mouth dryness.   Eyes:  Negative for dryness.  Respiratory:  Negative for shortness of breath.   Cardiovascular:  Negative for chest pain and palpitations.  Gastrointestinal:  Negative for blood in stool, constipation and diarrhea.  Endocrine: Negative for increased urination.  Genitourinary:  Negative for involuntary urination.  Musculoskeletal:  Positive for joint pain, gait problem, joint pain, joint swelling, myalgias, muscle weakness, morning stiffness and  myalgias. Negative for muscle tenderness.  Skin:  Positive for sensitivity to sunlight. Negative for color change, rash and hair loss.  Allergic/Immunologic: Negative for susceptible to infections.  Neurological:  Negative for dizziness and headaches.  Hematological:  Negative for swollen glands.  Psychiatric/Behavioral:  Positive for sleep disturbance. Negative for depressed mood. The patient is not nervous/anxious.     PMFS History:  Patient Active Problem List   Diagnosis Date Noted   Type 2 diabetes mellitus without  complication, without long-term current use of insulin (HCC) 08/04/2022   Medial orbital wall fracture, closed, initial encounter (HCC) 10/03/2021   Bilateral carpal tunnel syndrome 09/22/2021   Rheumatoid factor positive 09/22/2021   Bilateral hand swelling 09/22/2021   Spondylolisthesis 05/06/2021   Uncontrolled type 2 diabetes mellitus with microalbuminuria 03/28/2021   Foot-drop 01/04/2021   Status post lumbar spinal fusion 02/12/2020   Spondylolisthesis of lumbar region 02/05/2020   Arthropathy of lumbar facet joint 12/11/2019   Chronic right-sided low back pain with right-sided sciatica 11/04/2019   Other chronic pain 11/04/2019   DDD (degenerative disc disease), lumbar 11/04/2019   Essential hypertension 11/04/2019   Body mass index (BMI) 50.0-59.9, adult (HCC) 11/04/2019   Spinal stenosis of lumbar region 11/04/2019   Spondylolisthesis, lumbar region 11/04/2019   Vitamin D deficiency 09/10/2019   CHF (congestive heart failure) (HCC) 01/30/2018   OSA (obstructive sleep apnea) 03/01/2017   ARF (acute renal failure) (HCC) 02/28/2017   Hyponatremia 02/28/2017   Syncope 07/23/2016   Syncope and collapse 07/23/2016   S/P cardiac catheterization, non obstructive disease 06/04/15 06/05/2015   CAD in native artery 06/05/2015   Fever 06/05/2015   Hypokalemia 06/05/2015   Metabolic syndrome 06/05/2015   Chest pain, neg MI, possible GI 06/04/2015   Colon cancer screening 08/05/2014   Severe obesity (BMI >= 40) (HCC) 08/05/2014   Health care maintenance 05/13/2014   Prediabetes 05/13/2014   Chronic migraine 04/27/2014   Atypical chest pain 04/27/2014   SOB (shortness of breath) 09/16/2013   Palpitations 09/16/2013   HTN (hypertension) 03/09/2012   Hyperlipidemia 03/09/2012   Microcytic anemia    Morbid obesity with BMI of 50.0-59.9, adult (HCC)    Precordial chest pain 03/06/2012   Dizziness 03/06/2012    Past Medical History:  Diagnosis Date   Abnormal Pap smear of cervix     pt couldnt state what type of abnormality   Anemia    Arthritis    CAD in native artery 06/05/2015   Carpal tunnel syndrome, bilateral    Chest pain    a. 02/2012 abnl myoview - inferoapical reverisbility concerning for ischemia, EF 59%;   02/2012 nonobstructive cath   CHF (congestive heart failure) (HCC)    Chronic migraine    Diabetes mellitus without complication (HCC)    GERD (gastroesophageal reflux disease)    HLD (hyperlipidemia)    Hypertension    Hypokalemia 06/05/2015   Metabolic syndrome 06/05/2015   Morbid obesity with BMI of 50.0-59.9, adult (HCC)    Prediabetes    S/P cardiac catheterization, non obstructive disease 06/04/15 06/05/2015   Sickle cell trait (HCC)    Spondylolisthesis of lumbar region    Syncope    Wears glasses     Family History  Problem Relation Age of Onset   Coronary artery disease Father 53   Heart disease Father    Other Father        died in a MVA   Hypertension Father    Sudden death Mother 7  Questionable MI   Diabetes Mother    Hypertension Mother    Hypertension Sister    Pancreatic cancer Maternal Aunt    Breast cancer Maternal Aunt        after age 9   Stroke Paternal Grandmother    Colon polyps Sister    Colon cancer Neg Hx    Gallbladder disease Neg Hx    Esophageal cancer Neg Hx    Past Surgical History:  Procedure Laterality Date   BACK SURGERY     x1 lumbar fusion   CARDIAC CATHETERIZATION N/A 06/04/2015   Procedure: Left Heart Cath and Coronary Angiography;  Surgeon: Lyn Records, MD;  Location: [facility] INVASIVE CV LAB;  Service: Cardiovascular;  Laterality: N/A;   CHOLECYSTECTOMY     laparoscopic   COLONOSCOPY WITH PROPOFOL N/A 09/04/2014   Procedure: COLONOSCOPY WITH PROPOFOL;  Surgeon: Iva Boop, MD;  Location: [facility] ENDOSCOPY;  Service: Endoscopy;  Laterality: N/A;   LEFT HEART CATHETERIZATION WITH CORONARY ANGIOGRAM N/A 03/08/2012   Procedure: LEFT HEART CATHETERIZATION WITH CORONARY ANGIOGRAM;  Surgeon:  Herby Abraham, MD;  Location: [facility] CATH LAB;  Service: Cardiovascular;  Laterality: N/A;   SPINE SURGERY     fusion   TUBAL LIGATION     Social History   Social History Narrative   Lives at home alone.   Immunization History  Administered Date(s) Administered   Influenza,inj,Quad PF,6+ Mos 04/28/2014, 07/25/2016   Janssen (J&J) SARS-COV-2 Vaccination 11/03/2019   Pneumococcal Polysaccharide-23 04/28/2014, 03/01/2017   Tdap 09/15/2012     Objective: Vital Signs: BP 131/84 (BP Location: Left Arm, Patient Position: Sitting, Cuff Size: Normal)   Pulse 75   Resp 16   Ht 5\' 1"  (1.549 m)   Wt 247 lb (112 kg)   LMP 09/15/2013   BMI 46.67 kg/m    Physical Exam Constitutional:      Appearance: She is obese.  Cardiovascular:     Rate and Rhythm: Normal rate and regular rhythm.  Pulmonary:     Effort: Pulmonary effort is normal.     Breath sounds: Normal breath sounds.  Lymphadenopathy:     Cervical: No cervical adenopathy.  Skin:    General: Skin is warm and dry.     Findings: No rash.  Neurological:     Mental Status: She is alert.  Psychiatric:        Mood and Affect: Mood normal.      Musculoskeletal Exam:  Shoulders full ROM no tenderness or swelling Elbows full ROM no tenderness or swelling Wrists full ROM left wrist swelling on flexor side, positive tinels and phalens Fingers full ROM no tenderness or swelling  Limited ultrasound exam of wrists demonstrating bilateral enlarged median nerve with hypoechogenicity   Investigation: No additional findings.  Imaging: US Guided Needle Placement  Result Date: 09/26/2022 Left wrist carpal tunnel injection Ultrasound guided injection is preferred based studies that show increased duration, increased effect, greater accuracy, decreased procedural pain, increased response rate, and decreased cost with ultrasound guided versus blind injection. Verbal informed consent obtained.  Time-out conducted.  Noted no overlying  erythema, induration, or other signs of local infection. Ultrasound-guided left carpal tunnel injection. After sterile prep with Betadine, injected 1 mL 1% lidocaine and 40 mg kenalog using a 27g needle by radial approach. Images 1-2 show advancement of needle next to median nerve. Images 3-7 show injection of medication into carpal tunnel space.   US Guided Needle Placement  Result Date: 09/26/2022 Right wrist carpal tunnel  injection Ultrasound guided injection is preferred based studies that show increased duration, increased effect, greater accuracy, decreased procedural pain, increased response rate, and decreased cost with ultrasound guided versus blind injection. Verbal informed consent obtained.  Time-out conducted.  Noted no overlying erythema, induration, or other signs of local infection. Ultrasound-guided right carpal tunnel injection. After sterile prep with Betadine, injected 1 mL 1% lidocaine and 40 mg kenalog using a 27g needle by ulnar approach. Images 1-2 show advancement of needle next to median nerve. Images 3-4 show injection of medication into carpal tunnel space.   Recent Labs: Lab Results  Component Value Date   WBC 3.7 04/27/2022   HGB 10.6 (L) 04/27/2022   PLT 321 04/27/2022   NA 142 04/27/2022   K 4.1 04/27/2022   CL 102 04/27/2022   CO2 27 04/27/2022   GLUCOSE 106 (H) 04/27/2022   BUN 15 04/27/2022   CREATININE 0.86 04/27/2022   BILITOT 0.2 04/27/2022   ALKPHOS 90 04/27/2022   AST 20 04/27/2022   ALT 7 04/27/2022   PROT 7.2 04/27/2022   ALBUMIN 4.3 04/27/2022   CALCIUM 9.8 04/27/2022   GFRAA 77 03/10/2020    Speciality Comments: No specialty comments available.  Procedures:  Hand/UE Inj: R carpal tunnel for carpal tunnel syndrome on 09/26/2022 12:10 PM Indications: pain Details: 27 G needle, ultrasound-guided ulnar approach Medications: 1 mL lidocaine 1 %; 40 mg triamcinolone acetonide 40 MG/ML Outcome: tolerated well, no immediate  complications Procedure, treatment alternatives, risks and benefits explained, specific risks discussed. Consent was given by the patient. Immediately prior to procedure a time out was called to verify the correct patient, procedure, equipment, support staff and site/side marked as required. Patient was prepped and draped in the usual sterile fashion.    Hand/UE Inj: L carpal tunnel for carpal tunnel syndrome on 09/26/2022 12:15 PM Indications: pain Details: 27 G needle, ultrasound-guided radial approach Medications: 1 mL lidocaine 1 %; 40 mg triamcinolone acetonide 40 MG/ML Outcome: tolerated well, no immediate complications Procedure, treatment alternatives, risks and benefits explained, specific risks discussed. Consent was given by the patient. Immediately prior to procedure a time out was called to verify the correct patient, procedure, equipment, support staff and site/side marked as required. Patient was prepped and draped in the usual sterile fashion.     Allergies: Patient has no known allergies.   Assessment / Plan:     Visit Diagnoses: Bilateral carpal tunnel syndrome - Plan: US Guided Needle Placement, US Guided Needle Placement  Recurrent symptoms highly consistent with CTS and ultrasound and physical exam showing inflammation. She had at least 6 months duration of very good symptom improvement after previous injection treatment, worse now at 10 months later. We will repeat again today to which she agrees. I recommend if symptoms start coming back repeatedly much sooner than 1 year may refer to hand surgery for definitive management.  Rheumatoid factor positive  No visible synovitis or change in joint inflammation outside of painful hands and wrists. Suspect not really indicative of RA. If she has RA then there is minimal disease activity.   Orders: Orders Placed This Encounter  Procedures   Hand/UE Inj   Hand/UE Inj   US Guided Needle Placement   US Guided Needle Placement    No orders of the defined types were placed in this encounter.    Follow-Up Instructions: Return in about 6 months (around 03/29/2023), or if symptoms worsen or fail to improve.   Fuller Plan, MD  Note -  This record has been created using [employer].  Chart creation errors have been sought, but may not always  have been located. Such creation errors do not reflect on  the standard of medical care.

## 2022-09-26 ENCOUNTER — Ambulatory Visit: Payer: 59 | Attending: Internal Medicine | Admitting: Internal Medicine

## 2022-09-26 ENCOUNTER — Encounter: Payer: Self-pay | Admitting: Internal Medicine

## 2022-09-26 ENCOUNTER — Ambulatory Visit: Payer: 59

## 2022-09-26 ENCOUNTER — Other Ambulatory Visit: Payer: Self-pay

## 2022-09-26 VITALS — BP 131/84 | HR 75 | Resp 16 | Ht 61.0 in | Wt 247.0 lb

## 2022-09-26 DIAGNOSIS — G5603 Carpal tunnel syndrome, bilateral upper limbs: Secondary | ICD-10-CM

## 2022-09-26 DIAGNOSIS — R768 Other specified abnormal immunological findings in serum: Secondary | ICD-10-CM

## 2022-09-26 DIAGNOSIS — I10 Essential (primary) hypertension: Secondary | ICD-10-CM

## 2022-09-26 MED ORDER — LIDOCAINE HCL 1 % IJ SOLN
1.0000 mL | INTRAMUSCULAR | Status: AC | PRN
  Administered 2022-09-26: 1 mL

## 2022-09-26 MED ORDER — TRIAMCINOLONE ACETONIDE 40 MG/ML IJ SUSP
40.0000 mg | INTRAMUSCULAR | Status: AC | PRN
  Administered 2022-09-26: 40 mg

## 2022-09-26 MED ORDER — AMLODIPINE BESYLATE 5 MG PO TABS: ORAL_TABLET | ORAL | 0 refills | Status: DC

## 2022-11-03 ENCOUNTER — Encounter: Payer: Self-pay | Admitting: Nurse Practitioner

## 2022-11-03 ENCOUNTER — Ambulatory Visit (INDEPENDENT_AMBULATORY_CARE_PROVIDER_SITE_OTHER): Payer: 59 | Admitting: Nurse Practitioner

## 2022-11-03 VITALS — BP 135/80 | HR 68 | Temp 97.5°F | Ht 61.0 in | Wt 239.2 lb

## 2022-11-03 DIAGNOSIS — M792 Neuralgia and neuritis, unspecified: Secondary | ICD-10-CM | POA: Diagnosis not present

## 2022-11-03 DIAGNOSIS — I10 Essential (primary) hypertension: Secondary | ICD-10-CM | POA: Diagnosis not present

## 2022-11-03 DIAGNOSIS — Z1231 Encounter for screening mammogram for malignant neoplasm of breast: Secondary | ICD-10-CM | POA: Diagnosis not present

## 2022-11-03 DIAGNOSIS — E119 Type 2 diabetes mellitus without complications: Secondary | ICD-10-CM

## 2022-11-03 DIAGNOSIS — E7849 Other hyperlipidemia: Secondary | ICD-10-CM

## 2022-11-03 LAB — POCT GLYCOSYLATED HEMOGLOBIN (HGB A1C): Hemoglobin A1C: 5.8 % — AB (ref 4.0–5.6)

## 2022-11-03 MED ORDER — ATORVASTATIN CALCIUM 40 MG PO TABS: 40.0000 mg | ORAL_TABLET | Freq: Every day | ORAL | 2 refills | Status: DC

## 2022-11-03 MED ORDER — METFORMIN HCL 500 MG PO TABS
500.0000 mg | ORAL_TABLET | Freq: Two times a day (BID) | ORAL | 3 refills | Status: DC
Start: 2022-11-03 — End: 2023-05-04

## 2022-11-03 MED ORDER — SPIRONOLACTONE 25 MG PO TABS: 25.0000 mg | ORAL_TABLET | Freq: Every day | ORAL | 3 refills | Status: DC

## 2022-11-03 MED ORDER — GABAPENTIN 100 MG PO CAPS: ORAL_CAPSULE | ORAL | 3 refills | Status: DC

## 2022-11-03 MED ORDER — CARVEDILOL 12.5 MG PO TABS: 12.5000 mg | ORAL_TABLET | Freq: Two times a day (BID) | ORAL | 3 refills | Status: DC

## 2022-11-03 MED ORDER — AMLODIPINE BESYLATE 5 MG PO TABS: ORAL_TABLET | ORAL | 1 refills | Status: DC

## 2022-11-03 MED ORDER — METFORMIN HCL 500 MG PO TABS: 500.0000 mg | ORAL_TABLET | Freq: Two times a day (BID) | ORAL | 3 refills | Status: DC

## 2022-11-03 NOTE — Assessment & Plan Note (Signed)
-   POCT glycosylated hemoglobin (Hb A1C) - CBC; Future - Comprehensive metabolic panel; Future - metFORMIN (GLUCOPHAGE) 500 MG tablet; Take 1 tablet (500 mg total) by mouth 2 (two) times daily with a meal.  Dispense: 90 tablet; Refill: 3  2. Encounter for screening mammogram for malignant neoplasm of breast  - MM Digital Screening  3. Essential hypertension  - spironolactone (ALDACTONE) 25 MG tablet; Take 1 tablet (25 mg total) by mouth daily.  Dispense: 90 tablet; Refill: 3 - carvedilol (COREG) 12.5 MG tablet; Take 1 tablet (12.5 mg total) by mouth 2 (two) times daily with a meal.  Dispense: 60 tablet; Refill: 3 - amLODipine (NORVASC) 5 MG tablet; TAKE 2 TABLETS(10 MG) BY MOUTH DAILY  Dispense: 180 tablet; Refill: 1  4. Neuropathic pain  - gabapentin (NEURONTIN) 100 MG capsule; TAKE ONE CAPSULE( 100 MG TOTAL) BY MOUTH AT BEDTIME FOR 180 DAY, THEN 2 CAPSULES( 200 MG TOTAL) AT BEDTIME  Dispense: 60 capsule; Refill: 3  5. Other hyperlipidemia  - atorvastatin (LIPITOR) 40 MG tablet; Take 1 tablet (40 mg total) by mouth daily.  Dispense: 90 tablet; Refill: 2   Follow up:  Follow up 6 months

## 2022-11-03 NOTE — Progress Notes (Signed)
  ID: Hannah Huerta, female    DOB: Jun 03, 1961, 62 y.o.   MRN: [id number]  Chief Complaint  Patient presents with   Diabetes    Referring provider: Ivonne Andrew, NP   HPI  Hannah Huerta 62 y.o. female  has a past medical history of Abnormal Pap smear of cervix, Anemia, Arthritis, CAD in native artery (06/05/2015), Carpal tunnel syndrome, bilateral, Chest pain, CHF (congestive heart failure) (HCC), Chronic migraine, Diabetes mellitus without complication (HCC), GERD (gastroesophageal reflux disease), HLD (hyperlipidemia), Hypertension, Hypokalemia (06/05/2015), Metabolic syndrome (06/05/2015), Morbid obesity with BMI of 50.0-59.9, adult (HCC), Prediabetes, S/P cardiac catheterization, non obstructive disease 06/04/15 (06/05/2015), Sickle cell trait (HCC), Spondylolisthesis of lumbar region, Syncope, and Wears glasses.    Hypertension: Patient here for follow-up of elevated blood pressure. She is exercising and is adherent to low salt diet. Cardiac symptoms none. Patient denies chest pain, exertional chest pressure/discomfort, irregular heart beat, and near-syncope.  Cardiovascular risk factors: diabetes mellitus, hypertension, and obesity (BMI >= 30 kg/m2). Use of agents associated with hypertension: none. History of target organ damage: none.   Diabetes Mellitus: Patient presents for follow up of diabetes. Symptoms: none. Patient denies none.  Evaluation to date has been included: hemoglobin A1C.  Home sugars: patient does not check sugars. Treatment to date: no recent interventions.    Patient states that she is due for a breast cancer screening.  We will order mammogram today.  Patient does need a refill on Neurontin for her neuropathy.  We will refill this today.  Patient does need refill on atorvastatin for hyperlipidemia.  Will place a refill for this today.    Denies f/c/s, n/v/d, hemoptysis, PND, leg swelling Denies chest pain or edema      No Known  Allergies  Immunization History  Administered Date(s) Administered   Influenza,inj,Quad PF,6+ Mos 04/28/2014, 07/25/2016   Janssen (J&J) SARS-COV-2 Vaccination 11/03/2019   Pneumococcal Polysaccharide-23 04/28/2014, 03/01/2017   Tdap 09/15/2012    Past Medical History:  Diagnosis Date   Abnormal Pap smear of cervix    pt couldnt state what type of abnormality   Anemia    Arthritis    CAD in native artery 06/05/2015   Carpal tunnel syndrome, bilateral    Chest pain    a. 02/2012 abnl myoview - inferoapical reverisbility concerning for ischemia, EF 59%;   02/2012 nonobstructive cath   CHF (congestive heart failure)    Chronic migraine    Diabetes mellitus without complication    GERD (gastroesophageal reflux disease)    HLD (hyperlipidemia)    Hypertension    Hypokalemia 06/05/2015   Metabolic syndrome 06/05/2015   Morbid obesity with BMI of 50.0-59.9, adult    Prediabetes    S/P cardiac catheterization, non obstructive disease 06/04/15 06/05/2015   Sickle cell trait    Spondylolisthesis of lumbar region    Syncope    Wears glasses     Tobacco History: Social History   Tobacco Use  Smoking Status Never   Passive exposure: Past  Smokeless Tobacco Never   Counseling given: Not Answered   Outpatient Encounter Medications as of 11/03/2022  Medication Sig   aspirin EC 81 MG tablet Take 1 tablet (81 mg total) by mouth daily.   [DISCONTINUED] amLODipine (NORVASC) 5 MG tablet TAKE 2 TABLETS(10 MG) BY MOUTH DAILY   [DISCONTINUED] atorvastatin (LIPITOR) 40 MG tablet Take 1 tablet (40 mg total) by mouth daily.   [DISCONTINUED] carvedilol (COREG) 12.5 MG tablet Take 1 tablet (12.5 mg  total) by mouth 2 (two) times daily with a meal.   [DISCONTINUED] gabapentin (NEURONTIN) 100 MG capsule TAKE ONE CAPSULE( 100 MG TOTAL) BY MOUTH AT BEDTIME FOR 180 DAY, THEN 2 CAPSULES( 200 MG TOTAL) AT BEDTIME   [DISCONTINUED] metFORMIN (GLUCOPHAGE) 500 MG tablet Take 1 tablet (500 mg total) by  mouth 2 (two) times daily with a meal.   [DISCONTINUED] spironolactone (ALDACTONE) 25 MG tablet Take 1 tablet (25 mg total) by mouth daily.   amLODipine (NORVASC) 5 MG tablet TAKE 2 TABLETS(10 MG) BY MOUTH DAILY   atorvastatin (LIPITOR) 40 MG tablet Take 1 tablet (40 mg total) by mouth daily.   carvedilol (COREG) 12.5 MG tablet Take 1 tablet (12.5 mg total) by mouth 2 (two) times daily with a meal.   gabapentin (NEURONTIN) 100 MG capsule TAKE ONE CAPSULE( 100 MG TOTAL) BY MOUTH AT BEDTIME FOR 180 DAY, THEN 2 CAPSULES( 200 MG TOTAL) AT BEDTIME   ibuprofen (ADVIL) 600 MG tablet Take 600 mg by mouth every 6 (six) hours as needed. (Patient not taking: Reported on 09/26/2022)   lidocaine (LIDODERM) 5 % Place 1 patch onto the skin daily. Remove & Discard patch within 12 hours or as directed by MD (Patient not taking: Reported on 09/22/2021)   metFORMIN (GLUCOPHAGE) 500 MG tablet Take 1 tablet (500 mg total) by mouth 2 (two) times daily with a meal.   naproxen (NAPROSYN) 375 MG tablet Take 1 tablet (375 mg total) by mouth 2 (two) times daily. (Patient not taking: Reported on 11/03/2022)   spironolactone (ALDACTONE) 25 MG tablet Take 1 tablet (25 mg total) by mouth daily.   [DISCONTINUED] metFORMIN (GLUCOPHAGE) 500 MG tablet Take 1 tablet (500 mg total) by mouth 2 (two) times daily with a meal.   No facility-administered encounter medications on file as of 11/03/2022.     Review of Systems  Review of Systems  Constitutional: Negative.   HENT: Negative.    Cardiovascular: Negative.   Gastrointestinal: Negative.   Allergic/Immunologic: Negative.   Neurological: Negative.   Psychiatric/Behavioral: Negative.         Physical Exam  BP 135/80   Pulse 68   Temp (!) 97.5 F (36.4 C)   Ht 5\' 1"  (1.549 m)   Wt 239 lb 3.2 oz (108.5 kg)   LMP 09/15/2013   SpO2 100%   BMI 45.20 kg/m   Wt Readings from Last 5 Encounters:  11/03/22 239 lb 3.2 oz (108.5 kg)  09/26/22 247 lb (112 kg)  08/04/22 247  lb 9.6 oz (112.3 kg)  04/27/22 250 lb 9.6 oz (113.7 kg)  02/20/22 255 lb 6.4 oz (115.8 kg)     Physical Exam Vitals and nursing note reviewed.  Constitutional:      General: She is not in acute distress.    Appearance: She is well-developed.  Cardiovascular:     Rate and Rhythm: Normal rate and regular rhythm.  Pulmonary:     Effort: Pulmonary effort is normal.     Breath sounds: Normal breath sounds.  Neurological:     Mental Status: She is alert and oriented to person, place, and time.      Lab Results:  CBC    Component Value Date/Time   WBC 3.7 04/27/2022 1124   WBC 9.5 02/06/2020 0809   RBC 4.26 04/27/2022 1124   RBC 3.67 (L) 02/06/2020 0809   HGB 10.6 (L) 04/27/2022 1124   HCT 33.5 (L) 04/27/2022 1124   PLT 321 04/27/2022 1124   MCV 79  04/27/2022 1124   MCH 24.9 (L) 04/27/2022 1124   MCH 24.8 (L) 02/06/2020 0809   MCHC 31.6 04/27/2022 1124   MCHC 31.1 02/06/2020 0809   RDW 13.9 04/27/2022 1124   LYMPHSABS 1.1 10/27/2020 0937   MONOABS 0.6 02/05/2019 0127   EOSABS 0.2 10/27/2020 0937   BASOSABS 0.0 10/27/2020 0937    BMET    Component Value Date/Time   NA 142 04/27/2022 1124   K 4.1 04/27/2022 1124   CL 102 04/27/2022 1124   CO2 27 04/27/2022 1124   GLUCOSE 106 (H) 04/27/2022 1124   GLUCOSE 147 (H) 02/06/2020 0809   BUN 15 04/27/2022 1124   CREATININE 0.86 04/27/2022 1124   CREATININE 0.99 03/07/2017 1109   CALCIUM 9.8 04/27/2022 1124   GFRNONAA 67 03/10/2020 1012   GFRNONAA 64 03/07/2017 1109   GFRAA 77 03/10/2020 1012   GFRAA 74 03/07/2017 1109    BNP    Component Value Date/Time   BNP 32.6 06/03/2018 1558    ProBNP    Component Value Date/Time   PROBNP 11.9 10/31/2013 1707      Assessment & Plan:   Type 2 diabetes mellitus without complication, without long-term current use of insulin (HCC) - POCT glycosylated hemoglobin (Hb A1C) - CBC; Future - Comprehensive metabolic panel; Future - metFORMIN (GLUCOPHAGE) 500 MG tablet;  Take 1 tablet (500 mg total) by mouth 2 (two) times daily with a meal.  Dispense: 90 tablet; Refill: 3  2. Encounter for screening mammogram for malignant neoplasm of breast  - MM Digital Screening  3. Essential hypertension  - spironolactone (ALDACTONE) 25 MG tablet; Take 1 tablet (25 mg total) by mouth daily.  Dispense: 90 tablet; Refill: 3 - carvedilol (COREG) 12.5 MG tablet; Take 1 tablet (12.5 mg total) by mouth 2 (two) times daily with a meal.  Dispense: 60 tablet; Refill: 3 - amLODipine (NORVASC) 5 MG tablet; TAKE 2 TABLETS(10 MG) BY MOUTH DAILY  Dispense: 180 tablet; Refill: 1  4. Neuropathic pain  - gabapentin (NEURONTIN) 100 MG capsule; TAKE ONE CAPSULE( 100 MG TOTAL) BY MOUTH AT BEDTIME FOR 180 DAY, THEN 2 CAPSULES( 200 MG TOTAL) AT BEDTIME  Dispense: 60 capsule; Refill: 3  5. Other hyperlipidemia  - atorvastatin (LIPITOR) 40 MG tablet; Take 1 tablet (40 mg total) by mouth daily.  Dispense: 90 tablet; Refill: 2   Follow up:  Follow up 6 months      Ivonne Andrew, NP 11/03/2022

## 2022-11-03 NOTE — Patient Instructions (Addendum)
1. Type 2 diabetes mellitus without complication, without long-term current use of insulin  - POCT glycosylated hemoglobin (Hb A1C) - CBC; Future - Comprehensive metabolic panel; Future - metFORMIN (GLUCOPHAGE) 500 MG tablet; Take 1 tablet (500 mg total) by mouth 2 (two) times daily with a meal.  Dispense: 90 tablet; Refill: 3  2. Encounter for screening mammogram for malignant neoplasm of breast  - MM Digital Screening  3. Essential hypertension  - spironolactone (ALDACTONE) 25 MG tablet; Take 1 tablet (25 mg total) by mouth daily.  Dispense: 90 tablet; Refill: 3 - carvedilol (COREG) 12.5 MG tablet; Take 1 tablet (12.5 mg total) by mouth 2 (two) times daily with a meal.  Dispense: 60 tablet; Refill: 3 - amLODipine (NORVASC) 5 MG tablet; TAKE 2 TABLETS(10 MG) BY MOUTH DAILY  Dispense: 180 tablet; Refill: 1  4. Neuropathic pain  - gabapentin (NEURONTIN) 100 MG capsule; TAKE ONE CAPSULE( 100 MG TOTAL) BY MOUTH AT BEDTIME FOR 180 DAY, THEN 2 CAPSULES( 200 MG TOTAL) AT BEDTIME  Dispense: 60 capsule; Refill: 3  5. Other hyperlipidemia  - atorvastatin (LIPITOR) 40 MG tablet; Take 1 tablet (40 mg total) by mouth daily.  Dispense: 90 tablet; Refill: 2   Follow up:  Follow up 6 months

## 2022-11-07 ENCOUNTER — Other Ambulatory Visit: Payer: 59

## 2022-11-09 ENCOUNTER — Other Ambulatory Visit: Payer: 59

## 2022-11-09 DIAGNOSIS — E119 Type 2 diabetes mellitus without complications: Secondary | ICD-10-CM

## 2022-11-10 LAB — CBC
Hematocrit: 37.4 % (ref 34.0–46.6)
Hemoglobin: 11.8 g/dL (ref 11.1–15.9)
MCH: 25.4 pg — ABNORMAL LOW (ref 26.6–33.0)
MCHC: 31.6 g/dL (ref 31.5–35.7)
MCV: 80 fL (ref 79–97)
Platelets: 318 10*3/uL (ref 150–450)
RBC: 4.65 x10E6/uL (ref 3.77–5.28)
RDW: 15.1 % (ref 11.7–15.4)
WBC: 6.1 10*3/uL (ref 3.4–10.8)

## 2022-11-10 LAB — COMPREHENSIVE METABOLIC PANEL
ALT: 16 IU/L (ref 0–32)
AST: 30 IU/L (ref 0–40)
Albumin/Globulin Ratio: 1.4 (ref 1.2–2.2)
Albumin: 4.6 g/dL (ref 3.9–4.9)
Alkaline Phosphatase: 105 IU/L (ref 44–121)
BUN/Creatinine Ratio: 16 (ref 12–28)
BUN: 27 mg/dL (ref 8–27)
Bilirubin Total: 0.3 mg/dL (ref 0.0–1.2)
CO2: 23 mmol/L (ref 20–29)
Calcium: 9.9 mg/dL (ref 8.7–10.3)
Chloride: 101 mmol/L (ref 96–106)
Creatinine, Ser: 1.73 mg/dL — ABNORMAL HIGH (ref 0.57–1.00)
Globulin, Total: 3.2 g/dL (ref 1.5–4.5)
Glucose: 111 mg/dL — ABNORMAL HIGH (ref 70–99)
Potassium: 4.3 mmol/L (ref 3.5–5.2)
Sodium: 141 mmol/L (ref 134–144)
Total Protein: 7.8 g/dL (ref 6.0–8.5)
eGFR: 33 mL/min/{1.73_m2} — ABNORMAL LOW (ref >59)

## 2022-11-21 ENCOUNTER — Other Ambulatory Visit: Payer: Self-pay | Admitting: Nurse Practitioner

## 2022-11-21 DIAGNOSIS — Z1231 Encounter for screening mammogram for malignant neoplasm of breast: Secondary | ICD-10-CM

## 2022-11-30 ENCOUNTER — Ambulatory Visit (INDEPENDENT_AMBULATORY_CARE_PROVIDER_SITE_OTHER): Payer: 59

## 2022-11-30 VITALS — BP 135/80 | Ht 61.0 in | Wt 239.0 lb

## 2022-11-30 DIAGNOSIS — Z0000 Encounter for general adult medical examination without abnormal findings: Secondary | ICD-10-CM

## 2022-11-30 NOTE — Patient Instructions (Addendum)
Hannah Huerta , Thank you for taking time to come for your Medicare Wellness Visit. I appreciate your ongoing commitment to your health goals. Please review the following plan we discussed and let me know if I can assist you in the future.   These are the goals we discussed:  Goals      Patient Stated     Patient stated she would like to begin exercising this year         This is a list of the screening recommended for you and due dates:  Health Maintenance  Topic Date Due   DTaP/Tdap/Td vaccine (2 - Td or Tdap) 09/16/2022   COVID-19 Vaccine (2 - Janssen risk series) 07/17/2023*   Zoster (Shingles) Vaccine (1 of 2) 07/17/2023*   Mammogram  12/26/2022   Flu Shot  02/15/2023   Eye exam for diabetics  04/18/2023   Hemoglobin A1C  05/05/2023   Yearly kidney health urinalysis for diabetes  08/05/2023   Complete foot exam   08/09/2023   Yearly kidney function blood test for diabetes  11/09/2023   Medicare Annual Wellness Visit  11/30/2023   Pap Smear  05/06/2024   Colon Cancer Screening  09/04/2024   Hepatitis C Screening: USPSTF Recommendation to screen - Ages 18-79 yo.  Completed   HIV Screening  Completed   HPV Vaccine  Aged Out  *Topic was postponed. The date shown is not the original due date.    Advanced directives: Advance directive discussed with you today. Even though you declined this today, please call our office should you change your mind, and we can give you the proper paperwork for you to fill out.   Conditions/risks identified: Aim for 30 minutes of exercise or brisk walking, 6-8 glasses of water, and 5 servings of fruits and vegetables each day.   Next appointment: Follow up in one year for your annual wellness visit. Dec 06, 2023 at 9:30am via telephone  Preventive Care 40-64 Years, Female Preventive care refers to lifestyle choices and visits with your health care provider that can promote health and wellness. What does preventive care include? A yearly physical  exam. This is also called an annual well check. Dental exams once or twice a year. Routine eye exams. Ask your health care provider how often you should have your eyes checked. Personal lifestyle choices, including: Daily care of your teeth and gums. Regular physical activity. Eating a healthy diet. Avoiding tobacco and drug use. Limiting alcohol use. Practicing safe sex. Taking low-dose aspirin daily starting at age 20. Taking vitamin and mineral supplements as recommended by your health care provider. What happens during an annual well check? The services and screenings done by your health care provider during your annual well check will depend on your age, overall health, lifestyle risk factors, and family history of disease. Counseling  Your health care provider may ask you questions about your: Alcohol use. Tobacco use. Drug use. Emotional well-being. Home and relationship well-being. Sexual activity. Eating habits. Work and work Astr[employer]. Method of birth control. Menstrual cycle. Pregnancy history. Screening  You may have the following tests or measurements: Height, weight, and BMI. Blood pressure. Lipid and cholesterol levels. These may be checked every 5 years, or more frequently if you are over 40 years old. Skin check. Lung cancer screening. You may have this screening every year starting at age 65 if you have a 30-pack-year history of smoking and currently smoke or have quit within the past 15 years. Fecal occult blood  test (FOBT) of the stool. You may have this test every year starting at age 89. Flexible sigmoidoscopy or colonoscopy. You may have a sigmoidoscopy every 5 years or a colonoscopy every 10 years starting at age 30. Hepatitis C blood test. Hepatitis B blood test. Sexually transmitted disease (STD) testing. Diabetes screening. This is done by checking your blood sugar (glucose) after you have not eaten for a while (fasting). You may have this done  every 1-3 years. Mammogram. This may be done every 1-2 years. Talk to your health care provider about when you should start having regular mammograms. This may depend on whether you have a family history of breast cancer. BRCA-related cancer screening. This may be done if you have a family history of breast, ovarian, tubal, or peritoneal cancers. Pelvic exam and Pap test. This may be done every 3 years starting at age 29. Starting at age 30, this may be done every 5 years if you have a Pap test in combination with an HPV test. Bone density scan. This is done to screen for osteoporosis. You may have this scan if you are at high risk for osteoporosis. Discuss your test results, treatment options, and if necessary, the need for more tests with your health care provider. Vaccines  Your health care provider may recommend certain vaccines, such as: Influenza vaccine. This is recommended every year. Tetanus, diphtheria, and acellular pertussis (Tdap, Td) vaccine. You may need a Td booster every 10 years. Zoster vaccine. You may need this after age 25. Pneumococcal 13-valent conjugate (PCV13) vaccine. You may need this if you have certain conditions and were not previously vaccinated. Pneumococcal polysaccharide (PPSV23) vaccine. You may need one or two doses if you smoke cigarettes or if you have certain conditions. Talk to your health care provider about which screenings and vaccines you need and how often you need them. This information is not intended to replace advice given to you by your health care provider. Make sure you discuss any questions you have with your health care provider. Document Released: 07/30/2015 Document Revised: 03/22/2016 Document Reviewed: 05/04/2015 Elsevier Interactive Patient Education  2017 [employer].    Fall Prevention in the Home Falls can cause injuries. They can happen to people of all ages. There are many things you can do to make your home safe and to help  prevent falls. What can I do on the outside of my home? Regularly fix the edges of walkways and driveways and fix any cracks. Remove anything that might make you trip as you walk through a door, such as a raised step or threshold. Trim any bushes or trees on the path to your home. Use bright outdoor lighting. Clear any walking paths of anything that might make someone trip, such as rocks or tools. Regularly check to see if handrails are loose or broken. Make sure that both sides of any steps have handrails. Any raised decks and porches should have guardrails on the edges. Have any leaves, snow, or ice cleared regularly. Use sand or salt on walking paths during winter. Clean up any spills in your garage right away. This includes oil or grease spills. What can I do in the bathroom? Use night lights. Install grab bars by the toilet and in the tub and shower. Do not use towel bars as grab bars. Use non-skid mats or decals in the tub or shower. If you need to sit down in the shower, use a plastic, non-slip stool. Keep the floor dry. Clean up  any water that spills on the floor as soon as it happens. Remove soap buildup in the tub or shower regularly. Attach bath mats securely with double-sided non-slip rug tape. Do not have throw rugs and other things on the floor that can make you trip. What can I do in the bedroom? Use night lights. Make sure that you have a light by your bed that is easy to reach. Do not use any sheets or blankets that are too big for your bed. They should not hang down onto the floor. Have a firm chair that has side arms. You can use this for support while you get dressed. Do not have throw rugs and other things on the floor that can make you trip. What can I do in the kitchen? Clean up any spills right away. Avoid walking on wet floors. Keep items that you use a lot in easy-to-reach places. If you need to reach something above you, use a strong step stool that has a  grab bar. Keep electrical cords out of the way. Do not use floor polish or wax that makes floors slippery. If you must use wax, use non-skid floor wax. Do not have throw rugs and other things on the floor that can make you trip. What can I do with my stairs? Do not leave any items on the stairs. Make sure that there are handrails on both sides of the stairs and use them. Fix handrails that are broken or loose. Make sure that handrails are as long as the stairways. Check any carpeting to make sure that it is firmly attached to the stairs. Fix any carpet that is loose or worn. Avoid having throw rugs at the top or bottom of the stairs. If you do have throw rugs, attach them to the floor with carpet tape. Make sure that you have a light switch at the top of the stairs and the bottom of the stairs. If you do not have them, ask someone to add them for you. What else can I do to help prevent falls? Wear shoes that: Do not have high heels. Have rubber bottoms. Are comfortable and fit you well. Are closed at the toe. Do not wear sandals. If you use a stepladder: Make sure that it is fully opened. Do not climb a closed stepladder. Make sure that both sides of the stepladder are locked into place. Ask someone to hold it for you, if possible. Clearly mark and make sure that you can see: Any grab bars or handrails. First and last steps. Where the edge of each step is. Use tools that help you move around (mobility aids) if they are needed. These include: Canes. Walkers. Scooters. Crutches. Turn on the lights when you go into a dark area. Replace any light bulbs as soon as they burn out. Set up your furniture so you have a clear path. Avoid moving your furniture around. If any of your floors are uneven, fix them. If there are any pets around you, be aware of where they are. Review your medicines with your doctor. Some medicines can make you feel dizzy. This can increase your chance of  falling. Ask your doctor what other things that you can do to help prevent falls. This information is not intended to replace advice given to you by your health care provider. Make sure you discuss any questions you have with your health care provider. Document Released: 04/29/2009 Document Revised: 12/09/2015 Document Reviewed: 08/07/2014 Elsevier Interactive Patient Education  2017  [employer].

## 2022-11-30 NOTE — Progress Notes (Signed)
 I connected with  Hannah Huerta on 11/30/22 by a audio enabled telemedicine application and verified that I am speaking with the correct person using two identifiers.  Patient Location: Home  Provider Location: Home Office  I discussed the limitations of evaluation and management by telemedicine. The patient expressed understanding and agreed to proceed.  Subjective:   Hannah Huerta is a 62 y.o. female who presents for Medicare Annual (Subsequent) preventive examination.  Review of Systems     Cardiac Risk Factors include: diabetes mellitus;dyslipidemia;hypertension;obesity (BMI >30kg/m2);sedentary lifestyle     Objective:    Today's Vitals   11/30/22 0915 11/30/22 0917  BP: 135/80   Weight: 239 lb (108.4 kg)   Height: 5\' 1"  (1.549 m)   PainSc: 5  5   PainLoc: Hip    Body mass index is 45.16 kg/m.     11/30/2022    9:34 AM 01/27/2020   11:33 AM 11/27/2019    6:14 AM 10/16/2019    9:29 AM 06/03/2018   12:22 PM 03/07/2017    9:54 AM 02/28/2017    2:40 AM  Advanced Directives  Does Patient Have a Medical Advance Directive? No No No No No No No  Would patient like information on creating a medical advance directive? No - Patient declined No - Patient declined No - Patient declined No - Patient declined No - Patient declined  Yes (Inpatient - patient requests chaplain consult to create a medical advance directive)    Current Medications (verified) Outpatient Encounter Medications as of 11/30/2022  Medication Sig   amLODipine (NORVASC) 5 MG tablet TAKE 2 TABLETS(10 MG) BY MOUTH DAILY   aspirin EC 81 MG tablet Take 1 tablet (81 mg total) by mouth daily.   atorvastatin (LIPITOR) 40 MG tablet Take 1 tablet (40 mg total) by mouth daily.   carvedilol (COREG) 12.5 MG tablet Take 1 tablet (12.5 mg total) by mouth 2 (two) times daily with a meal.   gabapentin (NEURONTIN) 100 MG capsule TAKE ONE CAPSULE( 100 MG TOTAL) BY MOUTH AT BEDTIME FOR 180 DAY, THEN 2 CAPSULES( 200 MG TOTAL) AT  BEDTIME   metFORMIN (GLUCOPHAGE) 500 MG tablet Take 1 tablet (500 mg total) by mouth 2 (two) times daily with a meal.   spironolactone (ALDACTONE) 25 MG tablet Take 1 tablet (25 mg total) by mouth daily.   ibuprofen (ADVIL) 600 MG tablet Take 600 mg by mouth every 6 (six) hours as needed. (Patient not taking: Reported on 09/26/2022)   lidocaine (LIDODERM) 5 % Place 1 patch onto the skin daily. Remove & Discard patch within 12 hours or as directed by MD (Patient not taking: Reported on 09/22/2021)   naproxen (NAPROSYN) 375 MG tablet Take 1 tablet (375 mg total) by mouth 2 (two) times daily. (Patient not taking: Reported on 11/03/2022)   No facility-administered encounter medications on file as of 11/30/2022.    Allergies (verified) Patient has no known allergies.   History: Past Medical History:  Diagnosis Date   Abnormal Pap smear of cervix    pt couldnt state what type of abnormality   Anemia    Arthritis    CAD in native artery 06/05/2015   Carpal tunnel syndrome, bilateral    Chest pain    a. 02/2012 abnl myoview - inferoapical reverisbility concerning for ischemia, EF 59%;   02/2012 nonobstructive cath   CHF (congestive heart failure) (HCC)    Chronic migraine    Diabetes mellitus without complication (HCC)    GERD (  gastroesophageal reflux disease)    HLD (hyperlipidemia)    Hypertension    Hypokalemia 06/05/2015   Metabolic syndrome 06/05/2015   Morbid obesity with BMI of 50.0-59.9, adult (HCC)    Prediabetes    S/P cardiac catheterization, non obstructive disease 06/04/15 06/05/2015   Sickle cell trait (HCC)    Spondylolisthesis of lumbar region    Syncope    Wears glasses    Past Surgical History:  Procedure Laterality Date   BACK SURGERY     x1 lumbar fusion   CARDIAC CATHETERIZATION N/A 06/04/2015   Procedure: Left Heart Cath and Coronary Angiography;  Surgeon: Lyn Records, MD;  Location: [facility] INVASIVE CV LAB;  Service: Cardiovascular;  Laterality: N/A;    CHOLECYSTECTOMY     laparoscopic   COLONOSCOPY WITH PROPOFOL N/A 09/04/2014   Procedure: COLONOSCOPY WITH PROPOFOL;  Surgeon: Iva Boop, MD;  Location: [facility] ENDOSCOPY;  Service: Endoscopy;  Laterality: N/A;   LEFT HEART CATHETERIZATION WITH CORONARY ANGIOGRAM N/A 03/08/2012   Procedure: LEFT HEART CATHETERIZATION WITH CORONARY ANGIOGRAM;  Surgeon: Herby Abraham, MD;  Location: [facility] CATH LAB;  Service: Cardiovascular;  Laterality: N/A;   SPINE SURGERY     fusion   TUBAL LIGATION     Family History  Problem Relation Age of Onset   Coronary artery disease Father 76   Heart disease Father    Other Father        died in a MVA   Hypertension Father    Sudden death Mother 51       Questionable MI   Diabetes Mother    Hypertension Mother    Hypertension Sister    Pancreatic cancer Maternal Aunt    Breast cancer Maternal Aunt        after age 53   Stroke Paternal Grandmother    Colon polyps Sister    Colon cancer Neg Hx    Gallbladder disease Neg Hx    Esophageal cancer Neg Hx    Social History   Socioeconomic History   Marital status: Widowed    Spouse name: Not on file   Number of children: 3   Years of education: Not on file   Highest education level: Not on file  Occupational History   Occupation: Works in the Engineer, water: [employer]  Tobacco Use   Smoking status: Never    Passive exposure: Past   Smokeless tobacco: Never  Vaping Use   Vaping Use: Never used  Substance and Sexual Activity   Alcohol use: No   Drug use: No   Sexual activity: Not Currently    Birth control/protection: Surgical  Other Topics Concern   Not on file  Social History Narrative   Lives at home alone.   Social Determinants of Health   Financial Resource Strain: Low Risk  (11/30/2022)   Overall Financial Resource Strain (CARDIA)    Difficulty of Paying Living Expenses: Not hard at all  Food Insecurity: No Food Insecurity (11/30/2022)   Hunger Vital Sign    Worried About Running Out  of Food in the Last Year: Never true    Ran Out of Food in the Last Year: Never true  Transportation Needs: No Transportation Needs (11/30/2022)   PRAPARE - Administrator, Civil Service (Medical): No    Lack of Transportation (Non-Medical): No  Physical Activity: Inactive (11/30/2022)   Exercise Vital Sign    Days of Exercise per Week: 0 days    Minutes of  Exercise per Session: 0 min  Stress: No Stress Concern Present (11/30/2022)   [employer] of Occupational Health - Occupational Stress Questionnaire    Feeling of Stress : Not at all  Social Connections: Socially Integrated (11/30/2022)   Social Connection and Isolation Panel [NHANES]    Frequency of Communication with Friends and Family: More than three times a week    Frequency of Social Gatherings with Friends and Family: More than three times a week    Attends Religious Services: More than 4 times per year    Active Member of [employer] or Organizations: Yes    Attends Engineer, structural: More than 4 times per year    Marital Status: Married    Tobacco Counseling Counseling given: Yes   Clinical Intake:  Pre-visit preparation completed: Yes  Pain : 0-10 Pain Score: 5  Pain Type: Chronic pain Pain Location: Hip Pain Orientation: Right Pain Onset: More than a month ago Pain Frequency: Intermittent     BMI - recorded: 45.16 Nutritional Status: BMI > 30  Obese Nutritional Risks: None Diabetes: Yes CBG done?: No (telephone visit) Did pt. bring in CBG monitor from home?: No (telephone)  How often do you need to have someone help you when you read instructions, pamphlets, or other written materials from your doctor or pharmacy?: 1 - Never  Diabetic?Yes Nutrition Risk Assessment:  Has the patient had any N/V/D within the last 2 months?  No  Does the patient have any non-healing wounds?  No  Has the patient had any unintentional weight loss or weight gain?  No   Diabetes:  Is the patient  diabetic?  Yes  If diabetic, was a CBG obtained today?  No  telephone Did the patient bring in their glucometer from home?  No  How often do you monitor your CBG's? Patient doesn't monitor CBG's at home.   Financial Strains and Diabetes Management:  Are you having any financial strains with the device, your supplies or your medication? No .  Does the patient want to be seen by Chronic Care Management for management of their diabetes?  No  Would the patient like to be referred to a Nutritionist or for Diabetic Management?  No   Diabetic Exams:  Diabetic Eye Exam: Completed 04/17/2022 Diabetic Foot Exam: Completed 07/2022   Interpreter Needed?: No  Information entered by ::  Cartel Mauss, CMA   Activities of Daily Living    11/30/2022    9:37 AM  In your present state of health, do you have any difficulty performing the following activities:  Hearing? 0  Vision? 0  Comment wears eyeglasses  Difficulty concentrating or making decisions? 0  Walking or climbing stairs? 0  Dressing or bathing? 0  Doing errands, shopping? 0  Preparing Food and eating ? N  Using the Toilet? N  In the past six months, have you accidently leaked urine? N  Do you have problems with loss of bowel control? N  Managing your Medications? N  Managing your Finances? N  Housekeeping or managing your Housekeeping? N    Patient Care Team: Ivonne Andrew, NP as PCP - General (Pulmonary Disease) Fuller Plan, MD as Consulting Physician (Rheumatology) Charlsie Merles Kirstie Peri, DPM as Consulting Physician (Podiatry) Freddie Breech, DPM as Consulting Physician (Podiatry) Tressie Stalker, MD as Consulting Physician (Neurosurgery)  Indicate any recent Medical Services you may have received from other than [facility] providers in the past year (date may be approximate).     Assessment:  This is a routine wellness examination for [place].  Hearing/Vision screen Hearing Screening - Comments:: Patient denies any  hearing difficulties.   Vision Screening - Comments:: Patient wears eyeglasses and is UTD with yearly eye exam.    Dietary issues and exercise activities discussed: Current Exercise Habits: The patient does not participate in regular exercise at present, Exercise limited by: orthopedic condition(s) (hip pain)   Goals Addressed             This Visit's Progress    Patient Stated       Patient stated she would like to begin exercising this year        Depression Screen    11/30/2022    9:26 AM 11/03/2022    8:13 AM 04/27/2022   10:39 AM 01/03/2022   10:31 AM 10/03/2021    9:41 AM 08/08/2021   10:05 AM 05/06/2021    8:18 AM  PHQ 2/9 Scores  PHQ - 2 Score 0 0 0 0 0 0 0  PHQ- 9 Score   1 0       Fall Risk    11/30/2022    9:34 AM 04/27/2022   10:24 AM 01/03/2022   10:31 AM 10/03/2021    9:40 AM 08/08/2021   10:05 AM  Fall Risk   Falls in the past year? 0 1 0 0 0  Number falls in past yr: 0 0 0 0 0  Injury with Fall? 0 1 0 0 0  Risk for fall due to : No Fall Risks  No Fall Risks No Fall Risks   Follow up Falls prevention discussed Falls evaluation completed Falls evaluation completed Falls evaluation completed     FALL RISK PREVENTION PERTAINING TO THE HOME:  Any stairs in or around the home? No  If so, are there any without handrails? No  Home free of loose throw rugs in walkways, pet beds, electrical cords, etc? Yes  Adequate lighting in your home to reduce risk of falls? Yes   ASSISTIVE DEVICES UTILIZED TO PREVENT FALLS:  Life alert? No  Use of a cane, walker or w/c? No  Grab bars in the bathroom? No  Shower chair or bench in shower? No  Elevated toilet seat or a handicapped toilet? No   TIMED UP AND GO:  Was the test performed? No . Telephone visit Cognitive Function:        11/30/2022    9:38 AM 11/24/2021   10:07 AM  6CIT Screen  What Year? 0 points 0 points  What month? 0 points 0 points  What time? 0 points 0 points  Count back from 20 0 points  2 points  Months in reverse 0 points 2 points  Repeat phrase 0 points 0 points  Total Score 0 points 4 points    Immunizations Immunization History  Administered Date(s) Administered   Influenza,inj,Quad PF,6+ Mos 04/28/2014, 07/25/2016   Janssen (J&J) SARS-COV-2 Vaccination 11/03/2019   Pneumococcal Polysaccharide-23 04/28/2014, 03/01/2017   Tdap 09/15/2012    TDAP status: Due, Education has been provided regarding the importance of this vaccine. Advised may receive this vaccine at local pharmacy or Health Dept. Aware to provide a copy of the vaccination record if obtained from local pharmacy or Health Dept. Verbalized acceptance and understanding.  Flu Vaccine status: Up to date  Pneumococcal vaccine status: Up to date  Covid-19 vaccine status: Completed vaccines  Qualifies for Shingles Vaccine? Yes   Zostavax completed Yes   Shingrix Completed?: No.  Education has been provided regarding the importance of this vaccine. Patient has been advised to call insurance company to determine out of pocket expense if they have not yet received this vaccine. Advised may also receive vaccine at local pharmacy or Health Dept. Verbalized acceptance and understanding.  Screening Tests Health Maintenance  Topic Date Due   COVID-19 Vaccine (2 - Janssen risk series) 12/01/2019   DTaP/Tdap/Td (2 - Td or Tdap) 09/16/2022   Zoster Vaccines- Shingrix (1 of 2) 07/17/2023 (Originally 03/15/1980)   MAMMOGRAM  12/26/2022   INFLUENZA VACCINE  02/15/2023   OPHTHALMOLOGY EXAM  04/18/2023   HEMOGLOBIN A1C  05/05/2023   Diabetic kidney evaluation - Urine ACR  08/05/2023   FOOT EXAM  08/09/2023   Diabetic kidney evaluation - eGFR measurement  11/09/2023   Medicare Annual Wellness (AWV)  11/30/2023   PAP SMEAR-Modifier  05/06/2024   COLONOSCOPY (Pts 45-63yrs Insurance coverage will need to be confirmed)  09/04/2024   Hepatitis C Screening  Completed   HIV Screening  Completed   HPV VACCINES  Aged Out     Health Maintenance  Health Maintenance Due  Topic Date Due   COVID-19 Vaccine (2 - Janssen risk series) 12/01/2019   DTaP/Tdap/Td (2 - Td or Tdap) 09/16/2022    Colorectal cancer screening: Type of screening: Colonoscopy. Completed 09/04/2014. Repeat every 10 years  Mammogram status: Ordered 12/28/2022. Pt provided with contact info and advised to call to schedule appt.     Lung Cancer Screening: (Low Dose CT Chest recommended if Age 58-80 years, 30 pack-year currently smoking OR have quit w/in 15years.) does not qualify.    Additional Screening:  Hepatitis C Screening: does qualify; Completed 03/07/2017  Vision Screening: Recommended annual ophthalmology exams for early detection of glaucoma and other disorders of the eye. Is the patient up to date with their annual eye exam?  Yes  Who is the provider or what is the name of the office in which the patient attends annual eye exams? [facility] If pt is not established with a provider, would they like to be referred to a provider to establish care?  Currently established .   Dental Screening: Recommended annual dental exams for proper oral hygiene  Community Resource Referral / Chronic Care Management: CRR required this visit?  No   CCM required this visit?  No      Plan:     I have personally reviewed and noted the following in the patient's chart:   Medical and social history Use of alcohol, tobacco or illicit drugs  Current medications and supplements including opioid prescriptions. Patient is not currently taking opioid prescriptions. Functional ability and status Nutritional status Physical activity Advanced directives List of other physicians Hospitalizations, surgeries, and ER visits in previous 12 months Vitals Screenings to include cognitive, depression, and falls Referrals and appointments  In addition, I have reviewed and discussed with patient certain preventive protocols, quality metrics, and best  practice recommendations. A written personalized care plan for preventive services as well as general preventive health recommendations were provided to patient.   Patient would like to access the AVS on my-chart    Jordan Hawks Khamryn Calderone, CMA   11/30/2022

## 2022-12-15 ENCOUNTER — Ambulatory Visit: Payer: Self-pay | Admitting: Nurse Practitioner

## 2022-12-20 ENCOUNTER — Emergency Department (HOSPITAL_COMMUNITY)
Admission: EM | Admit: 2022-12-20 | Discharge: 2022-12-20 | Disposition: A | Discharge status: Home / Self Care | Payer: 59 | Attending: Emergency Medicine | Admitting: Emergency Medicine

## 2022-12-20 ENCOUNTER — Other Ambulatory Visit: Payer: Self-pay

## 2022-12-20 ENCOUNTER — Encounter (HOSPITAL_COMMUNITY): Payer: Self-pay | Admitting: Emergency Medicine

## 2022-12-20 ENCOUNTER — Emergency Department (HOSPITAL_COMMUNITY): Payer: 59

## 2022-12-20 DIAGNOSIS — M25551 Pain in right hip: Secondary | ICD-10-CM | POA: Diagnosis present

## 2022-12-20 DIAGNOSIS — E119 Type 2 diabetes mellitus without complications: Secondary | ICD-10-CM | POA: Diagnosis not present

## 2022-12-20 DIAGNOSIS — I10 Essential (primary) hypertension: Secondary | ICD-10-CM | POA: Insufficient documentation

## 2022-12-20 DIAGNOSIS — Z7982 Long term (current) use of aspirin: Secondary | ICD-10-CM | POA: Diagnosis not present

## 2022-12-20 DIAGNOSIS — Z79899 Other long term (current) drug therapy: Secondary | ICD-10-CM | POA: Diagnosis not present

## 2022-12-20 DIAGNOSIS — Z7984 Long term (current) use of oral hypoglycemic drugs: Secondary | ICD-10-CM | POA: Insufficient documentation

## 2022-12-20 LAB — WET PREP, GENITAL
Clue Cells Wet Prep HPF POC: NONE SEEN
Sperm: NONE SEEN
Trich, Wet Prep: NONE SEEN
WBC, Wet Prep HPF POC: <10 (ref <10)
Yeast Wet Prep HPF POC: NONE SEEN

## 2022-12-20 LAB — URINALYSIS, ROUTINE W REFLEX MICROSCOPIC
Bacteria, UA: NONE SEEN
Bilirubin Urine: NEGATIVE
Glucose, UA: NEGATIVE mg/dL
Ketones, ur: NEGATIVE mg/dL
Leukocytes,Ua: NEGATIVE
Nitrite: NEGATIVE
Protein, ur: NEGATIVE mg/dL
Specific Gravity, Urine: 1.009 (ref 1.005–1.030)
pH: 5 (ref 5.0–8.0)

## 2022-12-20 MED ORDER — LIDOCAINE 5 % EX PTCH: 1.0000 | MEDICATED_PATCH | CUTANEOUS | 0 refills | Status: DC

## 2022-12-20 MED ORDER — HYDROCODONE-ACETAMINOPHEN 5-325 MG PO TABS: 1.0000 | ORAL_TABLET | Freq: Four times a day (QID) | ORAL | 0 refills | Status: DC | PRN

## 2022-12-20 MED ORDER — HYDROCODONE-ACETAMINOPHEN 5-325 MG PO TABS
1.0000 | ORAL_TABLET | Freq: Once | ORAL | Status: AC
  Administered 2022-12-20: 1 via ORAL
  Filled 2022-12-20: qty 1

## 2022-12-20 MED ORDER — LIDOCAINE 5 % EX PTCH
1.0000 | MEDICATED_PATCH | Freq: Once | CUTANEOUS | Status: DC
  Administered 2022-12-20: 1 via TRANSDERMAL
  Filled 2022-12-20: qty 1

## 2022-12-20 NOTE — Discharge Instructions (Addendum)
As we, your workup in the ER today was reassuring for acute findings.  X-ray imaging of your hip and your urine sample and vaginal swab did not reveal any emergent cause of your symptoms.  I have given you a prescription for a few doses of Vicodin to help with your pain to take as prescribed as needed for severe pain only.  Do not drive or operate heavy machinery while taking this medication as it can be sedating.  I recommend only taking it before bed as it can increase your fall risk as well.  I have also given you lidocaine patches for you to wear as needed.  Please call orthopedics to schedule follow-up appointment.  I have given you a referral with a number to call.  Please follow-up with your primary care doctor as well.  Return if development of any new or worsening symptoms.

## 2022-12-20 NOTE — ED Provider Notes (Signed)
EMERGENCY DEPARTMENT AT [facility] Provider Note   CSN: [id number] Arrival date & time: 12/20/22  4098     History  Chief Complaint  Patient presents with   Hip Pain    Hannah Huerta is a 62 y.o. female.  Patient with history of lumbar spinal stenosis, hypertension, spondylolisthesis of the lumbar spine and right-sided foot drop, diabetes, hyperlipidemia, GERD presents today with complaints of right hip pain. She states that same has been bothering her for the past 2 months and has been unchanged since then. States that it feels similar to when she was here in February 2023. She has not seen orthopedics for this. Denies any trauma. Denies any numbness or tingling, no pain in her back and is still able to ambulate with a cane which is her baseline. No loss of bowel or bladder function. Does note that she has had some dysuria and vaginal discharge for the past few weeks which she suspects is unrelated to her hip pain.  States she was diagnosed with bacterial vaginosis about a year ago and this feels similar.  Denies flank pain, abdominal pain, nausea, vomiting, or diarrhea.  The history is provided by the patient. No language interpreter was used.  Hip Pain       Home Medications Prior to Admission medications   Medication Sig Start Date End Date Taking? Authorizing Provider  amLODipine (NORVASC) 5 MG tablet TAKE 2 TABLETS(10 MG) BY MOUTH DAILY 11/03/22   Ivonne Andrew, NP  aspirin EC 81 MG tablet Take 1 tablet (81 mg total) by mouth daily. 03/07/17   Massie Maroon, FNP  atorvastatin (LIPITOR) 40 MG tablet Take 1 tablet (40 mg total) by mouth daily. 11/03/22   Ivonne Andrew, NP  carvedilol (COREG) 12.5 MG tablet Take 1 tablet (12.5 mg total) by mouth 2 (two) times daily with a meal. 11/03/22   Ivonne Andrew, NP  gabapentin (NEURONTIN) 100 MG capsule TAKE ONE CAPSULE( 100 MG TOTAL) BY MOUTH AT BEDTIME FOR 180 DAY, THEN 2 CAPSULES( 200 MG TOTAL) AT BEDTIME  11/03/22   Ivonne Andrew, NP  ibuprofen (ADVIL) 600 MG tablet Take 600 mg by mouth every 6 (six) hours as needed. Patient not taking: Reported on 09/26/2022 08/17/21   [provider]  lidocaine (LIDODERM) 5 % Place 1 patch onto the skin daily. Remove & Discard patch within 12 hours or as directed by MD Patient not taking: Reported on 09/22/2021 09/01/21   Redwine, Madison A, PA-C  metFORMIN (GLUCOPHAGE) 500 MG tablet Take 1 tablet (500 mg total) by mouth 2 (two) times daily with a meal. 11/03/22 05/02/23  Ivonne Andrew, NP  naproxen (NAPROSYN) 375 MG tablet Take 1 tablet (375 mg total) by mouth 2 (two) times daily. Patient not taking: Reported on 11/03/2022 09/01/21   Redwine, Madison A, PA-C  spironolactone (ALDACTONE) 25 MG tablet Take 1 tablet (25 mg total) by mouth daily. 11/03/22 11/03/23  Ivonne Andrew, NP      Allergies    Patient has no known allergies.    Review of Systems   Review of Systems  Musculoskeletal:  Positive for arthralgias.  All other systems reviewed and are negative.   Physical Exam Updated Vital Signs BP 124/70 (BP Location: Right Arm)   Pulse 71   Temp 98.3 F (36.8 C) (Oral)   Resp 17   Ht 5\' 1"  (1.549 m)   Wt 108.4 kg   LMP 09/15/2013   SpO2  96%   BMI 45.16 kg/m  Physical Exam Vitals and nursing note reviewed.  Constitutional:      General: She is not in acute distress.    Appearance: Normal appearance. She is normal weight. She is not ill-appearing, toxic-appearing or diaphoretic.  HENT:     Head: Normocephalic and atraumatic.  Cardiovascular:     Rate and Rhythm: Normal rate.  Pulmonary:     Effort: Pulmonary effort is normal. No respiratory distress.  Abdominal:     General: Abdomen is flat.     Palpations: Abdomen is soft.     Tenderness: There is no abdominal tenderness. There is no right CVA tenderness or left CVA tenderness.  Musculoskeletal:        General: Normal range of motion.     Cervical back: Normal range of motion.      Comments: Right hip TTP throughout with notable muscle tenderness as well. DP and PT pulses intact and 2+. ROM intact.  Skin:    General: Skin is warm and dry.  Neurological:     General: No focal deficit present.     Mental Status: She is alert.  Psychiatric:        Mood and Affect: Mood normal.        Behavior: Behavior normal.     ED Results / Procedures / Treatments   Labs (all labs ordered are listed, but only abnormal results are displayed) Labs Reviewed  URINALYSIS, ROUTINE W REFLEX MICROSCOPIC - Abnormal; Notable for the following components:      Result Value   Color, Urine STRAW (*)    Hgb urine dipstick SMALL (*)    All other components within normal limits  WET PREP, GENITAL    EKG None  Radiology DG Hip Unilat W or Wo Pelvis 2-3 Views Right  Result Date: 12/20/2022 CLINICAL DATA:  RIGHT hip pain EXAM: DG HIP (WITH OR WITHOUT PELVIS) 2-3V RIGHT COMPARISON:  None Available. FINDINGS: Osseous alignment is normal. Bone mineralization is normal. No fracture line or displaced fracture fragment is seen. No acute-appearing cortical irregularity or osseous lesion. No significant degenerative change at either hip joint. Soft tissues about the pelvis and about the RIGHT hip are unremarkable. IMPRESSION: Normal plain film examination of the RIGHT hip. No acute findings. No significant degenerative change. Electronically Signed   By: Bary Richard M.D.   On: 12/20/2022 11:05    Procedures Procedures    Medications Ordered in ED Medications  lidocaine (LIDODERM) 5 % 1 patch (1 patch Transdermal Patch Applied 12/20/22 1015)  HYDROcodone-acetaminophen (NORCO/VICODIN) 5-325 MG per tablet 1 tablet (1 tablet Oral Given 12/20/22 1015)    ED Course/ Medical Decision Making/ A&P                             Medical Decision Making Amount and/or Complexity of Data Reviewed Labs: ordered. Radiology: ordered.   Patient presents today with complaints of chronic right hip pain.  She is afebrile, nontoxic-appearing, and in no acute distress with reassuring vital signs.  Physical exam reveals TTP along the right hip area and surrounding musculature.  No CVA tenderness, no abdominal tenderness.  X-ray imaging obtained which has resulted and reveals no acute findings.  I have personally reviewed and interpreted this imaging and agree with radiology interpretation.  Patient also endorsed incidental findings of dysuria and vaginal discharge, therefore UA and wet prep were obtained both of which were negative for infection. Did  have some hematuria. Very low suspicion for kidney stone as patient states her symptoms have been unchanged for greater than a month.  Chart reviewed, it appears patient has been seen for this previously and was treated with NSAIDs and given an orthopedic referral.  She did not follow-up with orthopedics for this.  Unfortunately, her last creatinine was 1.7 and therefore do not feel that NSAIDs are appropriate at this time. Given norco and lidocaine patch in the ER today with improvement in symptoms.  Patient is able to walk without issue.  She does not have any back pain or s/s of cauda equina.  Plan to discharge with a few doses of Norco for pain and lidocaine patches.  PDMP reviewed.  Patient aware not to drive or operate heavy machinery while taking this medication and the fall risk associated with narcotics. Evaluation and diagnostic testing in the emergency department does not suggest an emergent condition requiring admission or immediate intervention beyond what has been performed at this time.  Plan for discharge with close PCP follow-up. Also given referral to orthopedics for follow-up. Patient is understanding and amenable with plan, educated on red flag symptoms that would prompt immediate return.  Patient discharged in stable condition.   Final Clinical Impression(s) / ED Diagnoses Final diagnoses:  Right hip pain    Rx / DC Orders ED Discharge Orders           Ordered    lidocaine (LIDODERM) 5 %  Every 24 hours        12/20/22 1306    HYDROcodone-acetaminophen (NORCO/VICODIN) 5-325 MG tablet  Every 6 hours PRN        12/20/22 1306          An After Visit Summary was printed and given to the patient.     Vear Clock 12/20/22 1306    Rondel Baton, MD 12/21/22 [phone]

## 2022-12-20 NOTE — ED Triage Notes (Signed)
Pt came in for right hip pain.  Pt states that she has had this pain for over a month but this morning was "worse." Moving makes pain worse.

## 2022-12-21 ENCOUNTER — Ambulatory Visit (INDEPENDENT_AMBULATORY_CARE_PROVIDER_SITE_OTHER): Payer: 59 | Admitting: Nurse Practitioner

## 2022-12-21 ENCOUNTER — Other Ambulatory Visit (HOSPITAL_COMMUNITY)
Admission: RE | Admit: 2022-12-21 | Discharge: 2022-12-21 | Disposition: A | Discharge status: Home / Self Care | Payer: 59 | Source: Ambulatory Visit | Attending: Nurse Practitioner | Admitting: Nurse Practitioner

## 2022-12-21 ENCOUNTER — Telehealth: Payer: Self-pay

## 2022-12-21 ENCOUNTER — Encounter: Payer: Self-pay | Admitting: Nurse Practitioner

## 2022-12-21 VITALS — BP 114/68 | HR 63 | Ht 61.0 in | Wt 239.6 lb

## 2022-12-21 DIAGNOSIS — Z124 Encounter for screening for malignant neoplasm of cervix: Secondary | ICD-10-CM | POA: Insufficient documentation

## 2022-12-21 DIAGNOSIS — N898 Other specified noninflammatory disorders of vagina: Secondary | ICD-10-CM | POA: Diagnosis not present

## 2022-12-21 MED ORDER — REPLENS VAGINAL MOISTURIZER VA GEL
1.0000 | VAGINAL | 2 refills | Status: DC
Start: 2022-12-21 — End: 2022-12-26

## 2022-12-21 NOTE — Transitions of Care (Post Inpatient/ED Visit) (Cosign Needed)
12/21/2022  Name: Hannah Huerta MRN: [id number] DOB: [birth year]  Today's TOC FU Call Status: Today's TOC FU Call Status:: Successful TOC FU Call Competed TOC FU Call Complete Date: 12/21/22  Transition Care Management Follow-up Telephone Call Date of Discharge: 12/20/22 Discharge Facility: [facility]) Type of Discharge: Emergency Department Reason for ED Visit: Orthopedic Conditions Orthopedic/Injury Diagnosis: Sprain or Strain (right hip pain) How have you been since you were released from the hospital?: Better Any questions or concerns?: No  Items Reviewed: Did you receive and understand the discharge instructions provided?: Yes Medications obtained,verified, and reconciled?: Yes (Medications Reviewed) Any new allergies since your discharge?: No Dietary orders reviewed?: NA Do you have support at home?: Yes People in Home: child(ren), adult  Medications Reviewed Today: Medications Reviewed Today     Reviewed by Renelda Loma, RMA (Registered Medical Assistant) on 12/21/22 at 1227  Med List Status: <None>   Medication Order Taking? Sig Documenting Provider Last Dose Status Informant  amLODipine (NORVASC) 5 MG tablet [id number] Yes TAKE 2 TABLETS(10 MG) BY MOUTH DAILY Ivonne Andrew, NP Taking Active   aspirin EC 81 MG tablet [id number] Yes Take 1 tablet (81 mg total) by mouth daily. Massie Maroon, FNP Taking Active Self           Med Note Haywood Lasso, SHARON H   Thu Sep 22, 2021  8:05 AM)    atorvastatin (LIPITOR) 40 MG tablet [id number] Yes Take 1 tablet (40 mg total) by mouth daily. Ivonne Andrew, NP Taking Active   carvedilol (COREG) 12.5 MG tablet [id number] Yes Take 1 tablet (12.5 mg total) by mouth 2 (two) times daily with a meal. Ivonne Andrew, NP Taking Active   gabapentin (NEURONTIN) 100 MG capsule [id number] Yes TAKE ONE CAPSULE( 100 MG TOTAL) BY MOUTH AT BEDTIME FOR 180 DAY, THEN 2 CAPSULES( 200 MG TOTAL) AT BEDTIME Ivonne Andrew, NP Taking Active    HYDROcodone-acetaminophen (NORCO/VICODIN) 5-325 MG tablet [id number] Yes Take 1 tablet by mouth every 6 (six) hours as needed for severe pain. Smoot, Shawn Route, PA-C Taking Active   ibuprofen (ADVIL) 600 MG tablet [id number] No Take 600 mg by mouth every 6 (six) hours as needed.  Patient not taking: Reported on 09/26/2022   [provider] Not Taking Active   lidocaine (LIDODERM) 5 % [id number] Yes Place 1 patch onto the skin daily. Remove & Discard patch within 12 hours or as directed by MD Smoot, Shawn Route, PA-C Taking Active   metFORMIN (GLUCOPHAGE) 500 MG tablet [id number] Yes Take 1 tablet (500 mg total) by mouth 2 (two) times daily with a meal. Ivonne Andrew, NP Taking Active   naproxen (NAPROSYN) 375 MG tablet [id number] No Take 1 tablet (375 mg total) by mouth 2 (two) times daily.  Patient not taking: Reported on 11/03/2022   Redwine, Gabriel Cirri, PA-C Not Taking Active   Replens Vaginal Moisturizer GEL [id number] Yes Place 1 Application vaginally every 3 (three) days. Ivonne Andrew, NP Taking Active   spironolactone (ALDACTONE) 25 MG tablet [id number] Yes Take 1 tablet (25 mg total) by mouth daily. Ivonne Andrew, NP Taking Active             Home Care and Equipment/Supplies: Were Home Health Services Ordered?: NA Any new equipment or medical supplies ordered?: NA  Functional Questionnaire: Do you need assistance with bathing/showering or dressing?: No Do you need assistance with meal preparation?: No Do you need assistance with eating?: No Do you have  difficulty maintaining continence: No Do you need assistance with getting out of bed/getting out of a chair/moving?: No Do you have difficulty managing or taking your medications?: No  Follow up appointments reviewed: PCP Follow-up appointment confirmed?: NA Specialist Hospital Follow-up appointment confirmed?: No Reason Specialist Follow-Up Not Confirmed: Patient has Specialist Provider Number and will Call for  Appointment Do you need transportation to your follow-up appointment?: No Do you understand care options if your condition(s) worsen?: Yes-patient verbalized understanding    SIGNATURE Renelda Loma RMA

## 2022-12-21 NOTE — Assessment & Plan Note (Signed)
-   Cytology - PAP([place])  2. Vaginal dryness  - Replens Vaginal Moisturizer GEL; Place 1 Application vaginally every 3 (three) days.  Dispense: 6.7 g; Refill: 2   Follow up:  Follow up as scheduled

## 2022-12-21 NOTE — Progress Notes (Signed)
@Patient  ID: Hannah Huerta, female    DOB: 09-14-1960, 62 y.o.   MRN: [id number]  Chief Complaint  Patient presents with   Gynecologic Exam    Referring provider: Ivonne Andrew, NP   HPI  Patient presents today for Pap smear.  She states that she has been experiencing vaginal dryness.  We will trial Replens. Denies f/c/s, n/v/d, hemoptysis, PND, leg swelling Denies chest pain or edema       No Known Allergies  Immunization History  Administered Date(s) Administered   Influenza,inj,Quad PF,6+ Mos 04/28/2014, 07/25/2016   Janssen (J&J) SARS-COV-2 Vaccination 11/03/2019   Pneumococcal Polysaccharide-23 04/28/2014, 03/01/2017   Tdap 09/15/2012    Past Medical History:  Diagnosis Date   Abnormal Pap smear of cervix    pt couldnt state what type of abnormality   Anemia    Arthritis    CAD in native artery 06/05/2015   Carpal tunnel syndrome, bilateral    Chest pain    a. 02/2012 abnl myoview - inferoapical reverisbility concerning for ischemia, EF 59%;   02/2012 nonobstructive cath   CHF (congestive heart failure) (HCC)    Chronic migraine    Diabetes mellitus without complication (HCC)    GERD (gastroesophageal reflux disease)    HLD (hyperlipidemia)    Hypertension    Hypokalemia 06/05/2015   Metabolic syndrome 06/05/2015   Morbid obesity with BMI of 50.0-59.9, adult (HCC)    Prediabetes    S/P cardiac catheterization, non obstructive disease 06/04/15 06/05/2015   Sickle cell trait (HCC)    Spondylolisthesis of lumbar region    Syncope    Wears glasses     Tobacco History: Social History   Tobacco Use  Smoking Status Never   Passive exposure: Past  Smokeless Tobacco Never   Counseling given: Not Answered   Outpatient Encounter Medications as of 12/21/2022  Medication Sig   amLODipine (NORVASC) 5 MG tablet TAKE 2 TABLETS(10 MG) BY MOUTH DAILY   aspirin EC 81 MG tablet Take 1 tablet (81 mg total) by mouth daily.   atorvastatin (LIPITOR) 40 MG tablet  Take 1 tablet (40 mg total) by mouth daily.   carvedilol (COREG) 12.5 MG tablet Take 1 tablet (12.5 mg total) by mouth 2 (two) times daily with a meal.   gabapentin (NEURONTIN) 100 MG capsule TAKE ONE CAPSULE( 100 MG TOTAL) BY MOUTH AT BEDTIME FOR 180 DAY, THEN 2 CAPSULES( 200 MG TOTAL) AT BEDTIME   HYDROcodone-acetaminophen (NORCO/VICODIN) 5-325 MG tablet Take 1 tablet by mouth every 6 (six) hours as needed for severe pain.   lidocaine (LIDODERM) 5 % Place 1 patch onto the skin daily. Remove & Discard patch within 12 hours or as directed by MD   metFORMIN (GLUCOPHAGE) 500 MG tablet Take 1 tablet (500 mg total) by mouth 2 (two) times daily with a meal.   Replens Vaginal Moisturizer GEL Place 1 Application vaginally every 3 (three) days.   spironolactone (ALDACTONE) 25 MG tablet Take 1 tablet (25 mg total) by mouth daily.   ibuprofen (ADVIL) 600 MG tablet Take 600 mg by mouth every 6 (six) hours as needed. (Patient not taking: Reported on 09/26/2022)   naproxen (NAPROSYN) 375 MG tablet Take 1 tablet (375 mg total) by mouth 2 (two) times daily. (Patient not taking: Reported on 11/03/2022)   No facility-administered encounter medications on file as of 12/21/2022.     Review of Systems  Review of Systems  Constitutional: Negative.   HENT: Negative.    Cardiovascular: Negative.  Gastrointestinal: Negative.   Allergic/Immunologic: Negative.   Neurological: Negative.   Psychiatric/Behavioral: Negative.         Physical Exam  BP 114/68   Pulse 63   Ht 5\' 1"  (1.549 m)   Wt 239 lb 9.6 oz (108.7 kg)   LMP 09/15/2013   SpO2 98%   BMI 45.27 kg/m   Wt Readings from Last 5 Encounters:  12/21/22 239 lb 9.6 oz (108.7 kg)  12/20/22 239 lb (108.4 kg)  11/30/22 239 lb (108.4 kg)  11/03/22 239 lb 3.2 oz (108.5 kg)  09/26/22 247 lb (112 kg)     Physical Exam Vitals and nursing note reviewed. Exam conducted with a chaperone present.  Constitutional:      General: She is not in acute  distress.    Appearance: She is well-developed.  Cardiovascular:     Rate and Rhythm: Normal rate and regular rhythm.  Pulmonary:     Effort: Pulmonary effort is normal.     Breath sounds: Normal breath sounds.  Genitourinary:    General: Normal vulva.     Vagina: Normal.     Cervix: Normal.     Uterus: Normal.   Neurological:     Mental Status: She is alert and oriented to person, place, and time.      Lab Results:  CBC    Component Value Date/Time   WBC 6.1 11/09/2022 0813   WBC 9.5 02/06/2020 0809   RBC 4.65 11/09/2022 0813   RBC 3.67 (L) 02/06/2020 0809   HGB 11.8 11/09/2022 0813   HCT 37.4 11/09/2022 0813   PLT 318 11/09/2022 0813   MCV 80 11/09/2022 0813   MCH 25.4 (L) 11/09/2022 0813   MCH 24.8 (L) 02/06/2020 0809   MCHC 31.6 11/09/2022 0813   MCHC 31.1 02/06/2020 0809   RDW 15.1 11/09/2022 0813   LYMPHSABS 1.1 10/27/2020 0937   MONOABS 0.6 02/05/2019 0127   EOSABS 0.2 10/27/2020 0937   BASOSABS 0.0 10/27/2020 0937    BMET    Component Value Date/Time   NA 141 11/09/2022 0813   K 4.3 11/09/2022 0813   CL 101 11/09/2022 0813   CO2 23 11/09/2022 0813   GLUCOSE 111 (H) 11/09/2022 0813   GLUCOSE 147 (H) 02/06/2020 0809   BUN 27 11/09/2022 0813   CREATININE 1.73 (H) 11/09/2022 0813   CREATININE 0.99 03/07/2017 1109   CALCIUM 9.9 11/09/2022 0813   GFRNONAA 67 03/10/2020 1012   GFRNONAA 64 03/07/2017 1109   GFRAA 77 03/10/2020 1012   GFRAA 74 03/07/2017 1109    BNP    Component Value Date/Time   BNP 32.6 06/03/2018 1558    ProBNP    Component Value Date/Time   PROBNP 11.9 10/31/2013 1707    Imaging: DG Hip Unilat W or Wo Pelvis 2-3 Views Right  Result Date: 12/20/2022 CLINICAL DATA:  RIGHT hip pain EXAM: DG HIP (WITH OR WITHOUT PELVIS) 2-3V RIGHT COMPARISON:  None Available. FINDINGS: Osseous alignment is normal. Bone mineralization is normal. No fracture line or displaced fracture fragment is seen. No acute-appearing cortical irregularity or  osseous lesion. No significant degenerative change at either hip joint. Soft tissues about the pelvis and about the RIGHT hip are unremarkable. IMPRESSION: Normal plain film examination of the RIGHT hip. No acute findings. No significant degenerative change. Electronically Signed   By: Bary Richard M.D.   On: 12/20/2022 11:05     Assessment & Plan:   Cervical cancer screening - Cytology - PAP([place])  2. Vaginal dryness  - Replens Vaginal Moisturizer GEL; Place 1 Application vaginally every 3 (three) days.  Dispense: 6.7 g; Refill: 2   Follow up:  Follow up as scheduled     Ivonne Andrew, NP 12/21/2022

## 2022-12-21 NOTE — Patient Instructions (Signed)
1. Cervical cancer screening  - Cytology - PAP([place])  2. Vaginal dryness  - Replens Vaginal Moisturizer GEL; Place 1 Application vaginally every 3 (three) days.  Dispense: 6.7 g; Refill: 2   Follow up:  Follow up as scheduled

## 2022-12-25 ENCOUNTER — Telehealth: Payer: Self-pay | Admitting: Nurse Practitioner

## 2022-12-25 NOTE — Telephone Encounter (Signed)
Caller & Relationship to patient:  MRN #  [id number]   Call Back Number:   Date of Last Office Visit: 12/21/2022     Date of Next Office Visit: 05/04/2023    Medication(s) to be Refilled: Cream med refill  Preferred Pharmacy:   ** Please notify patient to allow 48-72 hours to process** **Let patient know to contact pharmacy at the end of the day to make sure medication is ready. ** **If patient has not been seen in a year or longer, book an appointment **Advise to use MyChart for refill requests OR to contact their pharmacy

## 2022-12-26 ENCOUNTER — Other Ambulatory Visit: Payer: Self-pay

## 2022-12-26 DIAGNOSIS — N898 Other specified noninflammatory disorders of vagina: Secondary | ICD-10-CM

## 2022-12-26 MED ORDER — REPLENS VAGINAL MOISTURIZER VA GEL
1.0000 | VAGINAL | 2 refills | Status: AC
Start: 2022-12-26 — End: ?

## 2022-12-26 NOTE — Telephone Encounter (Signed)
Medication re sent it to pt pharmacy. Gh

## 2022-12-27 LAB — CYTOLOGY - PAP: Diagnosis: NEGATIVE

## 2022-12-28 ENCOUNTER — Ambulatory Visit
Admission: RE | Admit: 2022-12-28 | Discharge: 2022-12-28 | Disposition: A | Discharge status: Home / Self Care | Payer: 59 | Source: Ambulatory Visit | Attending: Nurse Practitioner | Admitting: Nurse Practitioner

## 2022-12-28 DIAGNOSIS — Z1231 Encounter for screening mammogram for malignant neoplasm of breast: Secondary | ICD-10-CM

## 2023-01-28 ENCOUNTER — Other Ambulatory Visit: Payer: Self-pay | Admitting: Nurse Practitioner

## 2023-01-28 ENCOUNTER — Other Ambulatory Visit: Payer: Self-pay | Admitting: Family Medicine

## 2023-01-28 DIAGNOSIS — I10 Essential (primary) hypertension: Secondary | ICD-10-CM

## 2023-02-10 ENCOUNTER — Emergency Department (HOSPITAL_COMMUNITY): Payer: 59

## 2023-02-10 ENCOUNTER — Encounter (HOSPITAL_COMMUNITY): Payer: Self-pay | Admitting: Emergency Medicine

## 2023-02-10 ENCOUNTER — Emergency Department (HOSPITAL_COMMUNITY)
Admission: EM | Admit: 2023-02-10 | Discharge: 2023-02-10 | Disposition: A | Discharge status: Home / Self Care | Payer: 59 | Attending: Emergency Medicine | Admitting: Emergency Medicine

## 2023-02-10 ENCOUNTER — Other Ambulatory Visit: Payer: Self-pay

## 2023-02-10 DIAGNOSIS — Z79899 Other long term (current) drug therapy: Secondary | ICD-10-CM | POA: Insufficient documentation

## 2023-02-10 DIAGNOSIS — Z7982 Long term (current) use of aspirin: Secondary | ICD-10-CM | POA: Insufficient documentation

## 2023-02-10 DIAGNOSIS — E119 Type 2 diabetes mellitus without complications: Secondary | ICD-10-CM | POA: Diagnosis not present

## 2023-02-10 DIAGNOSIS — I110 Hypertensive heart disease with heart failure: Secondary | ICD-10-CM | POA: Insufficient documentation

## 2023-02-10 DIAGNOSIS — M25551 Pain in right hip: Secondary | ICD-10-CM

## 2023-02-10 DIAGNOSIS — Z7984 Long term (current) use of oral hypoglycemic drugs: Secondary | ICD-10-CM | POA: Diagnosis not present

## 2023-02-10 DIAGNOSIS — I2510 Atherosclerotic heart disease of native coronary artery without angina pectoris: Secondary | ICD-10-CM | POA: Diagnosis not present

## 2023-02-10 DIAGNOSIS — I509 Heart failure, unspecified: Secondary | ICD-10-CM | POA: Diagnosis not present

## 2023-02-10 MED ORDER — IBUPROFEN 600 MG PO TABS: 600.0000 mg | ORAL_TABLET | Freq: Four times a day (QID) | ORAL | 0 refills | Status: AC | PRN

## 2023-02-10 MED ORDER — OXYCODONE HCL 5 MG PO TABS: 5.0000 mg | ORAL_TABLET | ORAL | 0 refills | Status: DC | PRN

## 2023-02-10 MED ORDER — ACETAMINOPHEN 325 MG PO TABS: 650.0000 mg | ORAL_TABLET | Freq: Four times a day (QID) | ORAL | 0 refills | Status: AC | PRN

## 2023-02-10 MED ORDER — HYDROCODONE-ACETAMINOPHEN 5-325 MG PO TABS
1.0000 | ORAL_TABLET | Freq: Once | ORAL | Status: AC
  Administered 2023-02-10: 1 via ORAL
  Filled 2023-02-10: qty 1

## 2023-02-10 MED ORDER — LIDOCAINE 5 % EX PTCH: 1.0000 | MEDICATED_PATCH | Freq: Every day | CUTANEOUS | 0 refills | Status: AC | PRN

## 2023-02-10 NOTE — Discharge Instructions (Addendum)
It was a pleasure caring for you today in the emergency department.  Please follow up with orthopedics  Please return to the emergency department for any worsening or worrisome symptoms.

## 2023-02-10 NOTE — ED Triage Notes (Signed)
Pt reports she's been having hip and back pain since Wednesday. Pt reports pain is so bad it makes he feel like she's going to throw up. Ptis also complaining of headache. Denies recent injury. Pt ambulatory with cane. VSS.

## 2023-02-10 NOTE — ED Provider Notes (Signed)
[place] EMERGENCY DEPARTMENT AT [facility] Provider Note  CSN: [id number] Arrival date & time: 02/10/23 1337  Chief Complaint(s) Hip Pain, Back Pain, Nausea, and Headache  HPI Hannah Huerta is a 62 y.o. female with past medical history as below, significant for CAD, CHF, DM, GERD, HLD, obesity who presents to the ED with complaint of recurrent right hip pain. Pain ongoing over a year, no recent trauma. Seen last month with similar complaint, has not follow up with orthopedics yet 2/2 prolonged wait for appointment, has not made appointment. Reports worsening right hip pain last few days. No falls, no fevers or chills, no numbness. Does report intermittent tingling to both her legs. No back pain. Pain is worsened with walking/ambulating but she has been able to ambulate / weight bear at home. Has been taking tylenol at home w/o much improvement. Feels her pain characteristics are similar but more severe over last couple days.   Past Medical History Past Medical History:  Diagnosis Date   Abnormal Pap smear of cervix    pt couldnt state what type of abnormality   Anemia    Arthritis    CAD in native artery 06/05/2015   Carpal tunnel syndrome, bilateral    Chest pain    a. 02/2012 abnl myoview - inferoapical reverisbility concerning for ischemia, EF 59%;   02/2012 nonobstructive cath   CHF (congestive heart failure) (HCC)    Chronic migraine    Diabetes mellitus without complication (HCC)    GERD (gastroesophageal reflux disease)    HLD (hyperlipidemia)    Hypertension    Hypokalemia 06/05/2015   Metabolic syndrome 06/05/2015   Morbid obesity with BMI of 50.0-59.9, adult (HCC)    Prediabetes    S/P cardiac catheterization, non obstructive disease 06/04/15 06/05/2015   Sickle cell trait (HCC)    Spondylolisthesis of lumbar region    Syncope    Wears glasses    Patient Active Problem List   Diagnosis Date Noted   Cervical cancer screening 12/21/2022   Type 2 diabetes  mellitus without complication, without long-term current use of insulin (HCC) 08/04/2022   Medial orbital wall fracture, closed, initial encounter (HCC) 10/03/2021   Bilateral carpal tunnel syndrome 09/22/2021   Rheumatoid factor positive 09/22/2021   Bilateral hand swelling 09/22/2021   Spondylolisthesis 05/06/2021   Uncontrolled type 2 diabetes mellitus with microalbuminuria 03/28/2021   Foot-drop 01/04/2021   Status post lumbar spinal fusion 02/12/2020   Spondylolisthesis of lumbar region 02/05/2020   Arthropathy of lumbar facet joint 12/11/2019   Chronic right-sided low back pain with right-sided sciatica 11/04/2019   Other chronic pain 11/04/2019   DDD (degenerative disc disease), lumbar 11/04/2019   Essential hypertension 11/04/2019   Body mass index (BMI) 50.0-59.9, adult (HCC) 11/04/2019   Spinal stenosis of lumbar region 11/04/2019   Spondylolisthesis, lumbar region 11/04/2019   Vitamin D deficiency 09/10/2019   CHF (congestive heart failure) (HCC) 01/30/2018   OSA (obstructive sleep apnea) 03/01/2017   ARF (acute renal failure) (HCC) 02/28/2017   Hyponatremia 02/28/2017   Syncope 07/23/2016   Syncope and collapse 07/23/2016   S/P cardiac catheterization, non obstructive disease 06/04/15 06/05/2015   CAD in native artery 06/05/2015   Fever 06/05/2015   Hypokalemia 06/05/2015   Metabolic syndrome 06/05/2015   Chest pain, neg MI, possible GI 06/04/2015   Colon cancer screening 08/05/2014   Severe obesity (BMI >= 40) (HCC) 08/05/2014   Health care maintenance 05/13/2014   Prediabetes 05/13/2014   Chronic migraine 04/27/2014  Atypical chest pain 04/27/2014   SOB (shortness of breath) 09/16/2013   Palpitations 09/16/2013   HTN (hypertension) 03/09/2012   Hyperlipidemia 03/09/2012   Microcytic anemia    Morbid obesity with BMI of 50.0-59.9, adult (HCC)    Precordial chest pain 03/06/2012   Dizziness 03/06/2012   Home Medication(s) Prior to Admission medications    Medication Sig Start Date End Date Taking? Authorizing Provider  acetaminophen (TYLENOL) 325 MG tablet Take 2 tablets (650 mg total) by mouth every 6 (six) hours as needed. 02/10/23  Yes Tanda Rockers A, DO  ibuprofen (ADVIL) 600 MG tablet Take 1 tablet (600 mg total) by mouth every 6 (six) hours as needed. 02/10/23  Yes Tanda Rockers A, DO  lidocaine (LIDODERM) 5 % Place 1 patch onto the skin daily as needed. Remove & Discard patch within 12 hours or as directed by MD 02/10/23  Yes Tanda Rockers A, DO  oxyCODONE (ROXICODONE) 5 MG immediate release tablet Take 1 tablet (5 mg total) by mouth every 4 (four) hours as needed for severe pain. 02/10/23  Yes Tanda Rockers A, DO  amLODipine (NORVASC) 5 MG tablet TAKE 2 TABLETS(10 MG) BY MOUTH DAILY 01/29/23   Ivonne Andrew, NP  aspirin EC 81 MG tablet Take 1 tablet (81 mg total) by mouth daily. 03/07/17   Massie Maroon, FNP  atorvastatin (LIPITOR) 40 MG tablet Take 1 tablet (40 mg total) by mouth daily. 11/03/22   Ivonne Andrew, NP  carvedilol (COREG) 12.5 MG tablet Take 1 tablet (12.5 mg total) by mouth 2 (two) times daily with a meal. 11/03/22   Ivonne Andrew, NP  gabapentin (NEURONTIN) 100 MG capsule TAKE ONE CAPSULE( 100 MG TOTAL) BY MOUTH AT BEDTIME FOR 180 DAY, THEN 2 CAPSULES( 200 MG TOTAL) AT BEDTIME 11/03/22   Ivonne Andrew, NP  metFORMIN (GLUCOPHAGE) 500 MG tablet Take 1 tablet (500 mg total) by mouth 2 (two) times daily with a meal. 11/03/22 05/02/23  Ivonne Andrew, NP  naproxen (NAPROSYN) 375 MG tablet Take 1 tablet (375 mg total) by mouth 2 (two) times daily. Patient not taking: Reported on 11/03/2022 09/01/21   Redwine, Madison A, PA-C  Replens Vaginal Moisturizer GEL Place 1 Application vaginally every 3 (three) days. 12/26/22   Ivonne Andrew, NP  spironolactone (ALDACTONE) 25 MG tablet Take 1 tablet (25 mg total) by mouth daily. 11/03/22 11/03/23  Ivonne Andrew, NP                                                                                                                                     Past Surgical History Past Surgical History:  Procedure Laterality Date   BACK SURGERY     x1 lumbar fusion   CARDIAC CATHETERIZATION N/A 06/04/2015   Procedure: Left Heart Cath and Coronary Angiography;  Surgeon: Lyn Records, MD;  Location: [facility] INVASIVE CV LAB;  Service: Cardiovascular;  Laterality: N/A;   CHOLECYSTECTOMY     laparoscopic   COLONOSCOPY WITH PROPOFOL N/A 09/04/2014   Procedure: COLONOSCOPY WITH PROPOFOL;  Surgeon: Iva Boop, MD;  Location: [facility] ENDOSCOPY;  Service: Endoscopy;  Laterality: N/A;   LEFT HEART CATHETERIZATION WITH CORONARY ANGIOGRAM N/A 03/08/2012   Procedure: LEFT HEART CATHETERIZATION WITH CORONARY ANGIOGRAM;  Surgeon: Herby Abraham, MD;  Location: [facility] CATH LAB;  Service: Cardiovascular;  Laterality: N/A;   SPINE SURGERY     fusion   TUBAL LIGATION     Family History Family History  Problem Relation Age of Onset   Coronary artery disease Father 56   Heart disease Father    Other Father        died in a MVA   Hypertension Father    Sudden death Mother 35       Questionable MI   Diabetes Mother    Hypertension Mother    Hypertension Sister    Pancreatic cancer Maternal Aunt    Breast cancer Maternal Aunt        after age 24   Stroke Paternal Grandmother    Colon polyps Sister    Colon cancer Neg Hx    Gallbladder disease Neg Hx    Esophageal cancer Neg Hx     Social History Social History   Tobacco Use   Smoking status: Never    Passive exposure: Past   Smokeless tobacco: Never  Vaping Use   Vaping status: Never Used  Substance Use Topics   Alcohol use: No   Drug use: No   Allergies Patient has no known allergies.  Review of Systems Review of Systems  Constitutional:  Negative for chills and fever.  Respiratory:  Negative for chest tightness and shortness of breath.   Cardiovascular:  Negative for chest pain and palpitations.  Gastrointestinal:  Negative for  abdominal pain, nausea and vomiting.  Genitourinary:  Negative for difficulty urinating, dysuria and vaginal pain.  Musculoskeletal:  Positive for arthralgias. Negative for back pain and joint swelling.  Neurological:  Negative for dizziness and numbness.  All other systems reviewed and are negative.   Physical Exam Vital Signs  I have reviewed the triage vital signs BP 116/61 (BP Location: Right Arm)   Pulse (!) 55   Temp (!) 97.1 F (36.2 C) (Oral)   Resp 18   Ht 5\' 1"  (1.549 m)   Wt 106.1 kg   LMP 09/15/2013   SpO2 100%   BMI 44.21 kg/m  Physical Exam Vitals and nursing note reviewed.  Constitutional:      General: She is not in acute distress.    Appearance: Normal appearance. She is well-developed. She is obese. She is not ill-appearing.  HENT:     Head: Normocephalic and atraumatic.     Right Ear: External ear normal.     Left Ear: External ear normal.     Nose: Nose normal.     Mouth/Throat:     Mouth: Mucous membranes are moist.  Eyes:     General: No scleral icterus.       Right eye: No discharge.        Left eye: No discharge.  Cardiovascular:     Rate and Rhythm: Normal rate.  Pulmonary:     Effort: Pulmonary effort is normal. No respiratory distress.     Breath sounds: Normal breath sounds. No stridor.  Abdominal:     General: Abdomen is flat. There is no distension.  Palpations: Abdomen is soft.     Tenderness: There is no abdominal tenderness. There is no guarding.  Musculoskeletal:        General: No deformity.     Cervical back: No rigidity.     Right lower leg: No edema.     Left lower leg: No edema.       Legs:     Comments: LE NVI   Skin:    General: Skin is warm and dry.     Coloration: Skin is not cyanotic, jaundiced or pale.  Neurological:     Mental Status: She is alert and oriented to person, place, and time.     GCS: GCS eye subscore is 4. GCS verbal subscore is 5. GCS motor subscore is 6.     Sensory: Sensation is intact.      Motor: Motor function is intact.     Comments: Strength 5/5 BLLE   Psychiatric:        Speech: Speech normal.        Behavior: Behavior normal. Behavior is cooperative.     ED Results and Treatments Labs (all labs ordered are listed, but only abnormal results are displayed) Labs Reviewed - No data to display                                                                                                                        Radiology DG Hip Unilat W or Wo Pelvis 2-3 Views Right  Result Date: 02/10/2023 CLINICAL DATA:  Pain EXAM: DG HIP (WITH OR WITHOUT PELVIS) 2-3V RIGHT COMPARISON:  12/20/2022 FINDINGS: There is no evidence of hip fracture or dislocation. There is no evidence of arthropathy or other focal bone abnormality. Partially imaged lower lumbar fusion hardware. IMPRESSION: Negative. Electronically Signed   By: Duanne Guess D.O.   On: 02/10/2023 14:45    Pertinent labs & imaging results that were available during my care of the patient were reviewed by me and considered in my medical decision making (see MDM for details).  Medications Ordered in ED Medications  HYDROcodone-acetaminophen (NORCO/VICODIN) 5-325 MG per tablet 1 tablet (1 tablet Oral Given 02/10/23 1708)                                                                                                                                     Procedures Procedures  (including critical care time)  Medical Decision Making /  ED Course    Medical Decision Making:    Hannah Huerta is a 62 y.o. female  with past medical history as below, significant for CAD, CHF, DM, GERD, HLD, obesity who presents to the ED with complaint of recurrent right hip pain. . The complaint involves an extensive differential diagnosis and also carries with it a high risk of complications and morbidity.  Serious etiology was considered. Ddx includes but is not limited to: arthritis, fx, sprain, soft tissue, septic joint, bursitis, etc  Complete  initial physical exam performed, notably the patient  was nad, resting on stretcher.    Reviewed and confirmed nursing documentation for past medical history, family history, social history.  Vital signs reviewed.      Pt here with chronic right hip pain, gradually worsening Has not been able to f/u with ortho XR was wnl Her exam is stable, LE are NVI No back pain Offered further imaging with CT of the affected area but pt prefers to go home and will f/u with ortho and her pcp. She is also not amenable to any lab work at this time She is feeling much better after analgesia here and is requesting discharge Give short course of oxy along with apap/nsaids/lidoderm Red flag warnings d/w pt  The patient improved significantly and was discharged in stable condition. Detailed discussions were had with the patient regarding current findings, and need for close f/u with PCP or on call doctor. The patient has been instructed to return immediately if the symptoms worsen in any way for re-evaluation. Patient verbalized understanding and is in agreement with current care plan. All questions answered prior to discharge.       Additional history obtained: -Additional history obtained from family -External records from outside source obtained and reviewed including: Chart review including previous notes, labs, imaging, consultation notes including prior ed visits, prior imaging    Lab Tests: na  EKG   EKG Interpretation Date/Time:    Ventricular Rate:    PR Interval:    QRS Duration:    QT Interval:    QTC Calculation:   R Axis:      Text Interpretation:           Imaging Studies ordered: I ordered imaging studies including XR hip I independently visualized the following imaging with scope of interpretation limited to determining acute life threatening conditions related to emergency care; findings noted above, significant for stable imaging  I independently visualized and  interpreted imaging. I agree with the radiologist interpretation   Medicines ordered and prescription drug management: Meds ordered this encounter  Medications   HYDROcodone-acetaminophen (NORCO/VICODIN) 5-325 MG per tablet 1 tablet   oxyCODONE (ROXICODONE) 5 MG immediate release tablet    Sig: Take 1 tablet (5 mg total) by mouth every 4 (four) hours as needed for severe pain.    Dispense:  8 tablet    Refill:  0   ibuprofen (ADVIL) 600 MG tablet    Sig: Take 1 tablet (600 mg total) by mouth every 6 (six) hours as needed.    Dispense:  30 tablet    Refill:  0   acetaminophen (TYLENOL) 325 MG tablet    Sig: Take 2 tablets (650 mg total) by mouth every 6 (six) hours as needed.    Dispense:  36 tablet    Refill:  0   lidocaine (LIDODERM) 5 %    Sig: Place 1 patch onto the skin daily as needed. Remove & Discard patch within  12 hours or as directed by MD    Dispense:  15 patch    Refill:  0    -I have reviewed the patients home medicines and have made adjustments as needed   Consultations Obtained: na   Cardiac Monitoring: Continuous pulse oximetry 99-100% on RA interpreted by myself   Social Determinants of Health:  Diagnosis or treatment significantly limited by social determinants of health: obesity   Reevaluation: After the interventions noted above, I reevaluated the patient and found that they have improved  Co morbidities that complicate the patient evaluation  Past Medical History:  Diagnosis Date   Abnormal Pap smear of cervix    pt couldnt state what type of abnormality   Anemia    Arthritis    CAD in native artery 06/05/2015   Carpal tunnel syndrome, bilateral    Chest pain    a. 02/2012 abnl myoview - inferoapical reverisbility concerning for ischemia, EF 59%;   02/2012 nonobstructive cath   CHF (congestive heart failure) (HCC)    Chronic migraine    Diabetes mellitus without complication (HCC)    GERD (gastroesophageal reflux disease)    HLD  (hyperlipidemia)    Hypertension    Hypokalemia 06/05/2015   Metabolic syndrome 06/05/2015   Morbid obesity with BMI of 50.0-59.9, adult (HCC)    Prediabetes    S/P cardiac catheterization, non obstructive disease 06/04/15 06/05/2015   Sickle cell trait (HCC)    Spondylolisthesis of lumbar region    Syncope    Wears glasses       Dispostion: Disposition decision including need for hospitalization was considered, and patient discharged from emergency department.    Final Clinical Impression(s) / ED Diagnoses Final diagnoses:  Right hip pain     This chart was dictated using voice recognition software.  Despite best efforts to proofread,  errors can occur which can change the documentation meaning.    Sloan Leiter, DO 02/10/23 1737

## 2023-02-13 ENCOUNTER — Telehealth: Payer: Self-pay

## 2023-02-13 NOTE — Transitions of Care (Post Inpatient/ED Visit) (Cosign Needed)
02/13/2023  Name: Hannah Huerta MRN: [id number] DOB: 1961/03/09  Today's TOC FU Call Status: Today's TOC FU Call Status:: Successful TOC FU Call Competed TOC FU Call Complete Date: 02/13/23  Transition Care Management Follow-up Telephone Call Date of Discharge: 02/10/23 Discharge Facility: [facility]) Type of Discharge: Emergency Department Reason for ED Visit: Orthopedic Conditions How have you been since you were released from the hospital?: Better Any questions or concerns?: No  Items Reviewed: Did you receive and understand the discharge instructions provided?: Yes Medications obtained,verified, and reconciled?: Yes (Medications Reviewed) Any new allergies since your discharge?: No Dietary orders reviewed?: NA Do you have support at home?: Yes People in Home: child(ren), adult  Medications Reviewed Today: Medications Reviewed Today     Reviewed by Renelda Loma, RMA (Registered Medical Assistant) on 02/13/23 at 1132  Med List Status: <None>   Medication Order Taking? Sig Documenting Provider Last Dose Status Informant  acetaminophen (TYLENOL) 325 MG tablet [id number] No Take 2 tablets (650 mg total) by mouth every 6 (six) hours as needed.  Patient not taking: Reported on 02/13/2023   Sloan Leiter, DO Not Taking Active   amLODipine (NORVASC) 5 MG tablet [id number] Yes TAKE 2 TABLETS(10 MG) BY MOUTH DAILY Ivonne Andrew, NP Taking Active   aspirin EC 81 MG tablet [id number] Yes Take 1 tablet (81 mg total) by mouth daily. Massie Maroon, FNP Taking Active Self           Med Note Haywood Lasso, SHARON H   Thu Sep 22, 2021  8:05 AM)    atorvastatin (LIPITOR) 40 MG tablet [id number] Yes Take 1 tablet (40 mg total) by mouth daily. Ivonne Andrew, NP Taking Active   carvedilol (COREG) 12.5 MG tablet [id number] Yes Take 1 tablet (12.5 mg total) by mouth 2 (two) times daily with a meal. Ivonne Andrew, NP Taking Active   gabapentin (NEURONTIN) 100 MG capsule [id number] Yes TAKE  ONE CAPSULE( 100 MG TOTAL) BY MOUTH AT BEDTIME FOR 180 DAY, THEN 2 CAPSULES( 200 MG TOTAL) AT BEDTIME Ivonne Andrew, NP Taking Active   ibuprofen (ADVIL) 600 MG tablet [id number] Yes Take 1 tablet (600 mg total) by mouth every 6 (six) hours as needed. Sloan Leiter, DO Taking Active   lidocaine (LIDODERM) 5 % [id number] Yes Place 1 patch onto the skin daily as needed. Remove & Discard patch within 12 hours or as directed by MD Sloan Leiter, DO Taking Active   metFORMIN (GLUCOPHAGE) 500 MG tablet [id number] Yes Take 1 tablet (500 mg total) by mouth 2 (two) times daily with a meal. Ivonne Andrew, NP Taking Active   naproxen (NAPROSYN) 375 MG tablet [id number] No Take 1 tablet (375 mg total) by mouth 2 (two) times daily.  Patient not taking: Reported on 11/03/2022   Redwine, Madison A, PA-C Not Taking Active   oxyCODONE (ROXICODONE) 5 MG immediate release tablet [id number] Yes Take 1 tablet (5 mg total) by mouth every 4 (four) hours as needed for severe pain. Sloan Leiter, DO Taking Active   Replens Vaginal Moisturizer GEL [id number] No Place 1 Application vaginally every 3 (three) days.  Patient not taking: Reported on 02/13/2023   Ivonne Andrew, NP Not Taking Active   spironolactone (ALDACTONE) 25 MG tablet [id number] Yes Take 1 tablet (25 mg total) by mouth daily. Ivonne Andrew, NP Taking Active             Home Care and Equipment/Supplies: Were Home  Health Services Ordered?: NA Any new equipment or medical supplies ordered?: NA  Functional Questionnaire: Do you need assistance with bathing/showering or dressing?: No Do you need assistance with meal preparation?: No Do you need assistance with eating?: No Do you have difficulty maintaining continence: No Do you need assistance with getting out of bed/getting out of a chair/moving?: No Do you have difficulty managing or taking your medications?: No  Follow up appointments reviewed: PCP Follow-up appointment confirmed?:  No Specialist Hospital Follow-up appointment confirmed?: Yes Date of Specialist follow-up appointment?: 04/16/23 Follow-Up Specialty Provider:: ortho Reason Specialist Follow-Up Not Confirmed: Patient has Specialist Provider Number and will Call for Appointment Do you need transportation to your follow-up appointment?: No Do you understand care options if your condition(s) worsen?: Yes-patient verbalized understanding    SIGNATURE Renelda Loma RMA

## 2023-02-25 ENCOUNTER — Encounter (HOSPITAL_COMMUNITY): Payer: Self-pay

## 2023-02-25 ENCOUNTER — Ambulatory Visit (HOSPITAL_COMMUNITY)
Admission: EM | Admit: 2023-02-25 | Discharge: 2023-02-25 | Disposition: A | Discharge status: Home / Self Care | Payer: 59 | Attending: Physician Assistant | Admitting: Physician Assistant

## 2023-02-25 DIAGNOSIS — R109 Unspecified abdominal pain: Secondary | ICD-10-CM

## 2023-02-25 DIAGNOSIS — R194 Change in bowel habit: Secondary | ICD-10-CM | POA: Diagnosis not present

## 2023-02-25 LAB — POCT URINALYSIS DIP (MANUAL ENTRY)
Bilirubin, UA: NEGATIVE
Glucose, UA: NEGATIVE mg/dL
Ketones, POC UA: NEGATIVE mg/dL
Leukocytes, UA: NEGATIVE
Nitrite, UA: NEGATIVE
Protein Ur, POC: NEGATIVE mg/dL
Spec Grav, UA: 1.02 (ref 1.010–1.025)
Urobilinogen, UA: 0.2 E.U./dL
pH, UA: 5.5 (ref 5.0–8.0)

## 2023-02-25 MED ORDER — POLYETHYLENE GLYCOL 3350 17 G PO PACK: 17.0000 g | PACK | Freq: Every day | ORAL | 0 refills | Status: DC

## 2023-02-25 NOTE — ED Provider Notes (Signed)
[facility]    CSN: [id number] Arrival date & time: 02/25/23  1535      History   Chief Complaint Chief Complaint  Patient presents with   Bloated    HPI Hannah Huerta is a 62 y.o. female.   Patient presents today with a 3-day history of intermittent discomfort and swelling noticed on her right abdomen.  States that she got dressed for church and noticed that her dress fit abnormally on the right side prompting evaluation today.  She denies any severe pain but does occasionally have some discomfort in this area.  She denies any fever, nausea, vomiting, melena, hematochezia.  She denies any changes to diet.  She does report that several weeks ago she was seen in the emergency room for orthopedic complaint and prescribed ibuprofen as well as oxycodone.  She did take a few doses of the oxycodone but is not taking this anymore.  She has had some change in her bowel habits and is having a harder time going to the bathroom.  She has not tried any medications for constipation.  She does report previous abdominal surgery and had cholecystectomy many years ago.  She is able to eat and drink despite symptoms.  Denies any falls or trauma.  Her last bowel movement was earlier today but was hard to pass.    Past Medical History:  Diagnosis Date   Abnormal Pap smear of cervix    pt couldnt state what type of abnormality   Anemia    Arthritis    CAD in native artery 06/05/2015   Carpal tunnel syndrome, bilateral    Chest pain    a. 02/2012 abnl myoview - inferoapical reverisbility concerning for ischemia, EF 59%;   02/2012 nonobstructive cath   CHF (congestive heart failure) (HCC)    Chronic migraine    Diabetes mellitus without complication (HCC)    GERD (gastroesophageal reflux disease)    HLD (hyperlipidemia)    Hypertension    Hypokalemia 06/05/2015   Metabolic syndrome 06/05/2015   Morbid obesity with BMI of 50.0-59.9, adult (HCC)    Prediabetes    S/P cardiac  catheterization, non obstructive disease 06/04/15 06/05/2015   Sickle cell trait (HCC)    Spondylolisthesis of lumbar region    Syncope    Wears glasses     Patient Active Problem List   Diagnosis Date Noted   Cervical cancer screening 12/21/2022   Type 2 diabetes mellitus without complication, without long-term current use of insulin (HCC) 08/04/2022   Medial orbital wall fracture, closed, initial encounter (HCC) 10/03/2021   Bilateral carpal tunnel syndrome 09/22/2021   Rheumatoid factor positive 09/22/2021   Bilateral hand swelling 09/22/2021   Spondylolisthesis 05/06/2021   Uncontrolled type 2 diabetes mellitus with microalbuminuria 03/28/2021   Foot-drop 01/04/2021   Status post lumbar spinal fusion 02/12/2020   Spondylolisthesis of lumbar region 02/05/2020   Arthropathy of lumbar facet joint 12/11/2019   Chronic right-sided low back pain with right-sided sciatica 11/04/2019   Other chronic pain 11/04/2019   DDD (degenerative disc disease), lumbar 11/04/2019   Essential hypertension 11/04/2019   Body mass index (BMI) 50.0-59.9, adult (HCC) 11/04/2019   Spinal stenosis of lumbar region 11/04/2019   Spondylolisthesis, lumbar region 11/04/2019   Vitamin D deficiency 09/10/2019   CHF (congestive heart failure) (HCC) 01/30/2018   OSA (obstructive sleep apnea) 03/01/2017   ARF (acute renal failure) (HCC) 02/28/2017   Hyponatremia 02/28/2017   Syncope 07/23/2016   Syncope and collapse 07/23/2016  S/P cardiac catheterization, non obstructive disease 06/04/15 06/05/2015   CAD in native artery 06/05/2015   Fever 06/05/2015   Hypokalemia 06/05/2015   Metabolic syndrome 06/05/2015   Chest pain, neg MI, possible GI 06/04/2015   Colon cancer screening 08/05/2014   Severe obesity (BMI >= 40) (HCC) 08/05/2014   Health care maintenance 05/13/2014   Prediabetes 05/13/2014   Chronic migraine 04/27/2014   Atypical chest pain 04/27/2014   SOB (shortness of breath) 09/16/2013    Palpitations 09/16/2013   HTN (hypertension) 03/09/2012   Hyperlipidemia 03/09/2012   Microcytic anemia    Morbid obesity with BMI of 50.0-59.9, adult (HCC)    Precordial chest pain 03/06/2012   Dizziness 03/06/2012    Past Surgical History:  Procedure Laterality Date   BACK SURGERY     x1 lumbar fusion   CARDIAC CATHETERIZATION N/A 06/04/2015   Procedure: Left Heart Cath and Coronary Angiography;  Surgeon: Lyn Records, MD;  Location: [facility] INVASIVE CV LAB;  Service: Cardiovascular;  Laterality: N/A;   CHOLECYSTECTOMY     laparoscopic   COLONOSCOPY WITH PROPOFOL N/A 09/04/2014   Procedure: COLONOSCOPY WITH PROPOFOL;  Surgeon: Iva Boop, MD;  Location: [facility] ENDOSCOPY;  Service: Endoscopy;  Laterality: N/A;   LEFT HEART CATHETERIZATION WITH CORONARY ANGIOGRAM N/A 03/08/2012   Procedure: LEFT HEART CATHETERIZATION WITH CORONARY ANGIOGRAM;  Surgeon: Herby Abraham, MD;  Location: [facility] CATH LAB;  Service: Cardiovascular;  Laterality: N/A;   SPINE SURGERY     fusion   TUBAL LIGATION      OB History     Gravida  5   Para  3   Term      Preterm      AB  2   Living  3      SAB      IAB  2   Ectopic      Multiple      Live Births               Home Medications    Prior to Admission medications   Medication Sig Start Date End Date Taking? Authorizing Provider  acetaminophen (TYLENOL) 325 MG tablet Take 2 tablets (650 mg total) by mouth every 6 (six) hours as needed. 02/10/23  Yes Tanda Rockers A, DO  amLODipine (NORVASC) 5 MG tablet TAKE 2 TABLETS(10 MG) BY MOUTH DAILY 01/29/23  Yes Ivonne Andrew, NP  aspirin EC 81 MG tablet Take 1 tablet (81 mg total) by mouth daily. 03/07/17  Yes Massie Maroon, FNP  atorvastatin (LIPITOR) 40 MG tablet Take 1 tablet (40 mg total) by mouth daily. 11/03/22  Yes Ivonne Andrew, NP  carvedilol (COREG) 12.5 MG tablet Take 1 tablet (12.5 mg total) by mouth 2 (two) times daily with a meal. 11/03/22  Yes Ivonne Andrew, NP   gabapentin (NEURONTIN) 100 MG capsule TAKE ONE CAPSULE( 100 MG TOTAL) BY MOUTH AT BEDTIME FOR 180 DAY, THEN 2 CAPSULES( 200 MG TOTAL) AT BEDTIME 11/03/22  Yes Ivonne Andrew, NP  ibuprofen (ADVIL) 600 MG tablet Take 1 tablet (600 mg total) by mouth every 6 (six) hours as needed. 02/10/23  Yes Tanda Rockers A, DO  lidocaine (LIDODERM) 5 % Place 1 patch onto the skin daily as needed. Remove & Discard patch within 12 hours or as directed by MD 02/10/23  Yes Tanda Rockers A, DO  metFORMIN (GLUCOPHAGE) 500 MG tablet Take 1 tablet (500 mg total) by mouth 2 (two) times daily with a meal.  11/03/22 05/02/23 Yes Ivonne Andrew, NP  naproxen (NAPROSYN) 375 MG tablet Take 1 tablet (375 mg total) by mouth 2 (two) times daily. 09/01/21  Yes Redwine, Madison A, PA-C  oxyCODONE (ROXICODONE) 5 MG immediate release tablet Take 1 tablet (5 mg total) by mouth every 4 (four) hours as needed for severe pain. 02/10/23  Yes Tanda Rockers A, DO  polyethylene glycol (MIRALAX) 17 g packet Take 17 g by mouth daily. 02/25/23  Yes , Denny Peon K, PA-C  Replens Vaginal Moisturizer GEL Place 1 Application vaginally every 3 (three) days. 12/26/22  Yes Ivonne Andrew, NP  spironolactone (ALDACTONE) 25 MG tablet Take 1 tablet (25 mg total) by mouth daily. 11/03/22 11/03/23 Yes Ivonne Andrew, NP    Family History Family History  Problem Relation Age of Onset   Coronary artery disease Father 38   Heart disease Father    Other Father        died in a MVA   Hypertension Father    Sudden death Mother 13       Questionable MI   Diabetes Mother    Hypertension Mother    Hypertension Sister    Pancreatic cancer Maternal Aunt    Breast cancer Maternal Aunt        after age 36   Stroke Paternal Grandmother    Colon polyps Sister    Colon cancer Neg Hx    Gallbladder disease Neg Hx    Esophageal cancer Neg Hx     Social History Social History   Tobacco Use   Smoking status: Never    Passive exposure: Past   Smokeless  tobacco: Never  Vaping Use   Vaping status: Never Used  Substance Use Topics   Alcohol use: No   Drug use: No     Allergies   Patient has no known allergies.   Review of Systems Review of Systems  Constitutional:  Negative for activity change, appetite change, fatigue and fever.  Respiratory:  Negative for cough and shortness of breath.   Cardiovascular:  Negative for chest pain.  Gastrointestinal:  Positive for abdominal pain and constipation. Negative for diarrhea, nausea and vomiting.  Genitourinary:  Negative for dysuria, frequency and urgency.  Musculoskeletal:  Negative for arthralgias, back pain and myalgias.     Physical Exam Triage Vital Signs ED Triage Vitals [02/25/23 1639]  Encounter Vitals Group     BP (!) 154/82     Systolic BP Percentile      Diastolic BP Percentile      Pulse Rate 73     Resp 16     Temp 98.3 F (36.8 C)     Temp Source Oral     SpO2 99 %     Weight      Height      Head Circumference      Peak Flow      Pain Score      Pain Loc      Pain Education      Exclude from Growth Chart    No data found.  Updated Vital Signs BP (!) 154/82 (BP Location: Left Arm)   Pulse 73   Temp 98.3 F (36.8 C) (Oral)   Resp 16   LMP 09/15/2013   SpO2 99%   Visual Acuity Right Eye Distance:   Left Eye Distance:   Bilateral Distance:    Right Eye Near:   Left Eye Near:    Bilateral Near:  Physical Exam Vitals reviewed.  Constitutional:      General: She is awake. She is not in acute distress.    Appearance: Normal appearance. She is well-developed. She is not ill-appearing.     Comments: Very pleasant female appears stated age in no acute distress sitting comfortably in exam room  HENT:     Head: Normocephalic and atraumatic.  Cardiovascular:     Rate and Rhythm: Normal rate and regular rhythm.     Heart sounds: Normal heart sounds, S1 normal and S2 normal. No murmur heard. Pulmonary:     Effort: Pulmonary effort is normal.      Breath sounds: Normal breath sounds. No wheezing, rhonchi or rales.     Comments: Clear auscultation bilaterally Abdominal:     General: Bowel sounds are normal.     Palpations: Abdomen is soft.     Tenderness: There is abdominal tenderness in the right upper quadrant and right lower quadrant. There is no right CVA tenderness, left CVA tenderness, guarding or rebound.     Comments: Mild tenderness palpation over right abdomen.  No evidence of acute abdomen on physical exam.  Psychiatric:        Behavior: Behavior is cooperative.      UC Treatments / Results  Labs (all labs ordered are listed, but only abnormal results are displayed) Labs Reviewed  POCT URINALYSIS DIP (MANUAL ENTRY) - Abnormal; Notable for the following components:      Result Value   Blood, UA trace-intact (*)    All other components within normal limits    EKG   Radiology No results found.  Procedures Procedures (including critical care time)  Medications Ordered in UC Medications - No data to display  Initial Impression / Assessment and Plan / UC Course  I have reviewed the triage vital signs and the nursing notes.  Pertinent labs & imaging results that were available during my care of the patient were reviewed by me and considered in my medical decision making (see chart for details).     Patient is well-appearing, afebrile, nontoxic, nontachycardic.  Vital signs and physical exam are reassuring with no indication for emergent evaluation or imaging.  Urine was obtained that showed trace blood and review of medical chart indicates that this is present for a minimum of 2 years.  Recommend she follow-up with her primary care.  I believe her symptoms might be related to constipation given her change in bowel habits.  This was likely triggered by narcotic use from her ER visit a few weeks ago.  She was encouraged to discontinue this medication and will use MiraLAX to encourage more regular bowel movements.   She was encouraged to use dietarily modification for additional symptom relief.  Discussed that if her symptoms are improving or if anything worsens and she has increasing pain, swelling, nausea, vomiting, fever she needs to be seen immediately.  Strict return precautions given.  Work excuse note provided.  Final Clinical Impressions(s) / UC Diagnoses   Final diagnoses:  Right sided abdominal pain  Change in bowel habit     Discharge Instructions      Your urine had a little bit of blood but this has been present for over 2 years.  Follow-up with your primary care about this.  As we discussed, I think your symptoms are related to constipation.  Start MiraLAX daily.  Make sure you are drinking plenty of fluid.  Drink plenty of fluid and increase the fiber in your diet.  If you develop any severe pain, nausea, vomiting, other symptoms you should be reevaluated.     ED Prescriptions     Medication Sig Dispense Auth. Provider   polyethylene glycol (MIRALAX) 17 g packet Take 17 g by mouth daily. 14 each , Noberto Retort, PA-C      PDMP not reviewed this encounter.   Jeani Hawking, PA-C 02/25/23 1716

## 2023-02-25 NOTE — ED Triage Notes (Signed)
Reports right side swelling RUQ. Pt states she noticed the swelling 3 days ago. Denies any injuries or falls to her stomach.

## 2023-02-25 NOTE — Discharge Instructions (Addendum)
Your urine had a little bit of blood but this has been present for over 2 years.  Follow-up with your primary care about this.  As we discussed, I think your symptoms are related to constipation.  Start MiraLAX daily.  Make sure you are drinking plenty of fluid.  Drink plenty of fluid and increase the fiber in your diet.  If you develop any severe pain, nausea, vomiting, other symptoms you should be reevaluated.

## 2023-03-13 LAB — HM DIABETES EYE EXAM

## 2023-03-20 NOTE — Progress Notes (Signed)
Office Visit Note  Patient: Hannah Huerta             Date of Birth: 09-Jan-1961           MRN: [id number]             PCP: Ivonne Andrew, NP Referring: Ivonne Andrew, NP Visit Date: 03/29/2023   Subjective:  Follow-up (Patient states her wrists are acting up again. )   History of Present Illness: Hannah Huerta is a 62 y.o. female here for follow up for bilateral carpal tunnel syndrome previously treated with steroid injections in March. She had a very good response all of this took a few weeks after the injections compared to starting at the next day when we initially did these last year.  Started having symptoms again since about 2 months ago have progressively been getting worse.  With more frequent numbness in her hands provoked intermittently during the day and common overnight. She does not see significant swelling anywhere else. She has an appointment for orthopedic surgery clinic evaluation later this month.  Previous HPI 09/26/2022 Hannah Huerta is a 62 y.o. female here for follow up for bilateral carpal tunnel syndrome previously treated with steroid injections in May last year. Symptoms worsening again for about 3 months, left side worse than right. Numbness in fingertips is returned and sometimes pain shoots up the arm. She notices swelling at the wrist no visible changes in her fingers. Driving worsens symptoms she has to rest her hands at the base of the steering wheel to avoid wrist extension.    Previous HPI 02/20/22 Hannah Huerta is a 62 y.o. female here for follow up bilateral hand pains consistent with CTS and possible RA. She felt a large improvement 1 day after wrist injections last visit and continues to do better. She is able to grip steering wheel normally and does not wake at night from wrist pain any more. Her right ankle has some pain and swelling worse later in the day, this is typical for her especially in the summer. She still has morning stiffness in multiple  areas lasting only a few minutes.   Previous HPI 11/18/2021 Hannah Huerta is a 62 y.o. female here for follow up for bilateral hand pains consistent with CTS and possible RA. She continues to have similar amount of wrist pain and swelling since our last visit. No appreciable benefit with conservative treatment of stretching or bracing wrist in neutral position.   09/22/21 Hannah Huerta is a 62 y.o. female here for evaluation for possible rheumatoid arthritis. Her history includes T2DM, migraine, CAD, CHF, and chronic low back pain with DJD and right sided sciatica. She has joint pain in multiple areas treatments including acetaminophen, topical diclofenac, gabapentin, naproxen although avoiding long term NSAIDs due to renal function.  He has had trouble due to right sided foot drop ongoing since about the past 4 years.  This is thought to be from residual damage due to significant lumbar spinal stenosis now status post surgical fusion.  Swelling and pain in this foot is worse in the past 1 year.  She is also having trouble with bilateral hands with pain and swelling also reports hand numbness that is worse at night mornings and when driving long distances.  She had previous evaluation with the symptoms in 2020 showing positive rheumatoid factor and CCP antibodies. She was never definitively diagnosed or on any disease specific treatments for RA.   Labs  reviewed 08/2018 RF 57.4 CCP 168 ANA neg   Review of Systems  Constitutional:  Negative for fatigue.  HENT:  Negative for mouth sores and mouth dryness.   Eyes:  Negative for dryness.  Respiratory:  Negative for shortness of breath.   Cardiovascular:  Negative for chest pain and palpitations.  Gastrointestinal:  Negative for blood in stool, constipation and diarrhea.  Endocrine: Negative for increased urination.  Genitourinary:  Negative for involuntary urination.  Musculoskeletal:  Positive for joint pain, gait problem, joint pain, joint swelling,  myalgias, muscle weakness, morning stiffness, muscle tenderness and myalgias.  Skin:  Positive for sensitivity to sunlight. Negative for color change, rash and hair loss.  Allergic/Immunologic: Negative for susceptible to infections.  Neurological:  Negative for dizziness and headaches.  Hematological:  Negative for swollen glands.  Psychiatric/Behavioral:  Positive for sleep disturbance. Negative for depressed mood. The patient is not nervous/anxious.     PMFS History:  Patient Active Problem List   Diagnosis Date Noted   Cervical cancer screening 12/21/2022   Type 2 diabetes mellitus without complication, without long-term current use of insulin (HCC) 08/04/2022   Medial orbital wall fracture, closed, initial encounter (HCC) 10/03/2021   Bilateral carpal tunnel syndrome 09/22/2021   Rheumatoid factor positive 09/22/2021   Bilateral hand swelling 09/22/2021   Spondylolisthesis 05/06/2021   Uncontrolled type 2 diabetes mellitus with microalbuminuria 03/28/2021   Foot-drop 01/04/2021   Status post lumbar spinal fusion 02/12/2020   Spondylolisthesis of lumbar region 02/05/2020   Arthropathy of lumbar facet joint 12/11/2019   Chronic right-sided low back pain with right-sided sciatica 11/04/2019   Other chronic pain 11/04/2019   DDD (degenerative disc disease), lumbar 11/04/2019   Essential hypertension 11/04/2019   Body mass index (BMI) 50.0-59.9, adult (HCC) 11/04/2019   Spinal stenosis of lumbar region 11/04/2019   Spondylolisthesis, lumbar region 11/04/2019   Vitamin D deficiency 09/10/2019   CHF (congestive heart failure) (HCC) 01/30/2018   OSA (obstructive sleep apnea) 03/01/2017   ARF (acute renal failure) (HCC) 02/28/2017   Hyponatremia 02/28/2017   Syncope 07/23/2016   Syncope and collapse 07/23/2016   S/P cardiac catheterization, non obstructive disease 06/04/15 06/05/2015   CAD in native artery 06/05/2015   Fever 06/05/2015   Hypokalemia 06/05/2015   Metabolic  syndrome 06/05/2015   Chest pain, neg MI, possible GI 06/04/2015   Colon cancer screening 08/05/2014   Severe obesity (BMI >= 40) (HCC) 08/05/2014   Health care maintenance 05/13/2014   Prediabetes 05/13/2014   Chronic migraine 04/27/2014   Atypical chest pain 04/27/2014   SOB (shortness of breath) 09/16/2013   Palpitations 09/16/2013   HTN (hypertension) 03/09/2012   Hyperlipidemia 03/09/2012   Microcytic anemia    Morbid obesity with BMI of 50.0-59.9, adult (HCC)    Precordial chest pain 03/06/2012   Dizziness 03/06/2012    Past Medical History:  Diagnosis Date   Abnormal Pap smear of cervix    pt couldnt state what type of abnormality   Anemia    Arthritis    CAD in native artery 06/05/2015   Carpal tunnel syndrome, bilateral    Chest pain    a. 02/2012 abnl myoview - inferoapical reverisbility concerning for ischemia, EF 59%;   02/2012 nonobstructive cath   CHF (congestive heart failure) (HCC)    Chronic migraine    Diabetes mellitus without complication (HCC)    GERD (gastroesophageal reflux disease)    HLD (hyperlipidemia)    Hypertension    Hypokalemia 06/05/2015   Metabolic syndrome  06/05/2015   Morbid obesity with BMI of 50.0-59.9, adult (HCC)    Prediabetes    S/P cardiac catheterization, non obstructive disease 06/04/15 06/05/2015   Sickle cell trait (HCC)    Spondylolisthesis of lumbar region    Syncope    Wears glasses     Family History  Problem Relation Age of Onset   Coronary artery disease Father 59   Heart disease Father    Other Father        died in a MVA   Hypertension Father    Sudden death Mother 36       Questionable MI   Diabetes Mother    Hypertension Mother    Hypertension Sister    Pancreatic cancer Maternal Aunt    Breast cancer Maternal Aunt        after age 72   Stroke Paternal Grandmother    Colon polyps Sister    Colon cancer Neg Hx    Gallbladder disease Neg Hx    Esophageal cancer Neg Hx    Past Surgical History:   Procedure Laterality Date   BACK SURGERY     x1 lumbar fusion   CARDIAC CATHETERIZATION N/A 06/04/2015   Procedure: Left Heart Cath and Coronary Angiography;  Surgeon: Lyn Records, MD;  Location: [facility] INVASIVE CV LAB;  Service: Cardiovascular;  Laterality: N/A;   CHOLECYSTECTOMY     laparoscopic   COLONOSCOPY WITH PROPOFOL N/A 09/04/2014   Procedure: COLONOSCOPY WITH PROPOFOL;  Surgeon: Iva Boop, MD;  Location: [facility];  Service: Endoscopy;  Laterality: N/A;   LEFT HEART CATHETERIZATION WITH CORONARY ANGIOGRAM N/A 03/08/2012   Procedure: LEFT HEART CATHETERIZATION WITH CORONARY ANGIOGRAM;  Surgeon: Herby Abraham, MD;  Location: [facility] CATH LAB;  Service: Cardiovascular;  Laterality: N/A;   SPINE SURGERY     fusion   TUBAL LIGATION     Social History   Social History Narrative   Lives at home alone.   Immunization History  Administered Date(s) Administered   Influenza,inj,Quad PF,6+ Mos 04/28/2014, 07/25/2016   Janssen (J&J) SARS-COV-2 Vaccination 11/03/2019   Pneumococcal Polysaccharide-23 04/28/2014, 03/01/2017   Tdap 09/15/2012     Objective: Vital Signs: BP 126/82 (BP Location: Left Arm, Patient Position: Sitting, Cuff Size: Normal)   Pulse 69   Resp 14   Ht 5\' 1"  (1.549 m)   Wt 240 lb (108.9 kg)   LMP 09/15/2013   BMI 45.35 kg/m    Physical Exam Constitutional:      Appearance: She is obese.  Eyes:     Conjunctiva/sclera: Conjunctivae normal.  Cardiovascular:     Rate and Rhythm: Normal rate and regular rhythm.  Pulmonary:     Effort: Pulmonary effort is normal.     Breath sounds: Normal breath sounds.  Lymphadenopathy:     Cervical: No cervical adenopathy.  Skin:    General: Skin is warm and dry.  Neurological:     Mental Status: She is alert.  Psychiatric:        Mood and Affect: Mood normal.      Musculoskeletal Exam:  Shoulders full ROM no tenderness or swelling Elbows full ROM no tenderness or swelling Wrists full ROM, tenderness to  pressure and radiating pain to percussion on flexor side of both wrists, no erythema or warmth Fingers full ROM no tenderness or swelling Knees full ROM no tenderness or swelling   Investigation: No additional findings.  Imaging: No results found.  Recent Labs: Lab Results  Component Value Date  WBC 6.1 11/09/2022   HGB 11.8 11/09/2022   PLT 318 11/09/2022   NA 141 11/09/2022   K 4.3 11/09/2022   CL 101 11/09/2022   CO2 23 11/09/2022   GLUCOSE 111 (H) 11/09/2022   BUN 27 11/09/2022   CREATININE 1.73 (H) 11/09/2022   BILITOT 0.3 11/09/2022   ALKPHOS 105 11/09/2022   AST 30 11/09/2022   ALT 16 11/09/2022   PROT 7.8 11/09/2022   ALBUMIN 4.6 11/09/2022   CALCIUM 9.9 11/09/2022   GFRAA 77 03/10/2020    Speciality Comments: No specialty comments available.  Procedures:  No procedures performed Allergies: Patient has no known allergies.   Assessment / Plan:     Visit Diagnoses: Bilateral carpal tunnel syndrome - S/P Hand/UE Inj: R carpal tunnel for carpal tunnel syndrome on 3/12/2024S/P Hand/UE Inj: L carpal tunnel for carpal tunnel syndrome on 09/26/2022  Current symptoms are again consistent with bilateral carpal tunnel syndrome.  Symptoms are recurrent after just 4 months so would benefit from definitive management plan.  She has upcoming appointment for evaluation so will avoid any additional procedure or steroid treatment at this time.  Rheumatoid factor positive - Plan: Sedimentation rate, Rheumatoid factor, Cyclic citrul peptide antibody, IgG  Previously low positive rheumatoid factor and sedimentation rate with the wrist swelling.  No other synovitis evident.  I am not sure if this truly represents rheumatoid arthritis if inflammatory markers are elevated again along with the increased wrist swelling discussed starting a maintenance treatment with hydroxychloroquine.  Orders: Orders Placed This Encounter  Procedures   Sedimentation rate   Rheumatoid factor    Cyclic citrul peptide antibody, IgG   No orders of the defined types were placed in this encounter.    Follow-Up Instructions: No follow-ups on file.   Fuller Plan, MD  Note - This record has been created using [employer].  Chart creation errors have been sought, but may not always  have been located. Such creation errors do not reflect on  the standard of medical care.

## 2023-03-26 ENCOUNTER — Other Ambulatory Visit: Payer: Self-pay | Admitting: Nurse Practitioner

## 2023-03-26 DIAGNOSIS — M792 Neuralgia and neuritis, unspecified: Secondary | ICD-10-CM

## 2023-03-29 ENCOUNTER — Encounter: Payer: Self-pay | Admitting: Internal Medicine

## 2023-03-29 ENCOUNTER — Ambulatory Visit: Payer: 59 | Attending: Internal Medicine | Admitting: Internal Medicine

## 2023-03-29 VITALS — BP 126/82 | HR 69 | Resp 14 | Ht 61.0 in | Wt 240.0 lb

## 2023-03-29 DIAGNOSIS — G5603 Carpal tunnel syndrome, bilateral upper limbs: Secondary | ICD-10-CM | POA: Diagnosis not present

## 2023-03-29 DIAGNOSIS — R768 Other specified abnormal immunological findings in serum: Secondary | ICD-10-CM

## 2023-03-31 LAB — SEDIMENTATION RATE: Sed Rate: 38 mm/h — ABNORMAL HIGH (ref 0–30)

## 2023-03-31 LAB — RHEUMATOID FACTOR: Rheumatoid fact SerPl-aCnc: 23 [IU]/mL — ABNORMAL HIGH (ref <14)

## 2023-03-31 LAB — CYCLIC CITRUL PEPTIDE ANTIBODY, IGG: Cyclic Citrullin Peptide Ab: 218 U — ABNORMAL HIGH

## 2023-04-06 ENCOUNTER — Other Ambulatory Visit: Payer: Self-pay | Admitting: Nurse Practitioner

## 2023-04-06 ENCOUNTER — Telehealth: Payer: Self-pay | Admitting: Nurse Practitioner

## 2023-04-06 DIAGNOSIS — I10 Essential (primary) hypertension: Secondary | ICD-10-CM

## 2023-04-06 MED ORDER — CARVEDILOL 12.5 MG PO TABS: 12.5000 mg | ORAL_TABLET | Freq: Two times a day (BID) | ORAL | 0 refills | Status: DC

## 2023-04-06 NOTE — Telephone Encounter (Signed)
Caller & Relationship to patient:  MRN #  [id number]   Call Back Number:   Date of Last Office Visit: 03/26/2023     Date of Next Office Visit: 05/04/2023    Medication(s) to be Refilled: Carvedilol needed 90 supply or 30  Preferred Pharmacy: [facility] on [address]  ** Please notify patient to allow 48-72 hours to process** **Let patient know to contact pharmacy at the end of the day to make sure medication is ready. ** **If patient has not been seen in a year or longer, book an appointment **Advise to use MyChart for refill requests OR to contact their pharmacy

## 2023-04-12 DIAGNOSIS — M25551 Pain in right hip: Secondary | ICD-10-CM | POA: Insufficient documentation

## 2023-05-04 ENCOUNTER — Encounter: Payer: Self-pay | Admitting: Nurse Practitioner

## 2023-05-04 ENCOUNTER — Ambulatory Visit (INDEPENDENT_AMBULATORY_CARE_PROVIDER_SITE_OTHER): Payer: 59 | Admitting: Nurse Practitioner

## 2023-05-04 VITALS — BP 110/71 | HR 65 | Wt 232.2 lb

## 2023-05-04 DIAGNOSIS — Z13220 Encounter for screening for lipoid disorders: Secondary | ICD-10-CM | POA: Diagnosis not present

## 2023-05-04 DIAGNOSIS — E119 Type 2 diabetes mellitus without complications: Secondary | ICD-10-CM

## 2023-05-04 MED ORDER — METFORMIN HCL 500 MG PO TABS
500.0000 mg | ORAL_TABLET | Freq: Two times a day (BID) | ORAL | 3 refills | Status: DC
Start: 2023-05-04 — End: 2023-11-15

## 2023-05-04 NOTE — Patient Instructions (Addendum)
1. Lipid screening  - Lipid Panel  2. Type 2 diabetes mellitus without complication, without long-term current use of insulin (HCC)  - CBC - Comprehensive metabolic panel - Lipid Panel  Follow up:  Follow up in 3 months

## 2023-05-04 NOTE — Progress Notes (Signed)
Subjective   Patient ID: DEBRO HILT, female    DOB: 1961-04-05, 62 y.o.   MRN: [id number]  Chief Complaint  Patient presents with   Medical Management of Chronic Issues    Referring provider: Ivonne Andrew, NP  Hannah Huerta is a 62 y.o. female with Past Medical History: No date: Abnormal Pap smear of cervix     Comment:  pt couldnt state what type of abnormality No date: Anemia No date: Arthritis 06/05/2015: CAD in native artery No date: Carpal tunnel syndrome, bilateral No date: Chest pain     Comment:  a. 02/2012 abnl myoview - inferoapical reverisbility               concerning for ischemia, EF 59%;   02/2012 nonobstructive               cath No date: CHF (congestive heart failure) (HCC) No date: Chronic migraine No date: Diabetes mellitus without complication (HCC) No date: GERD (gastroesophageal reflux disease) No date: HLD (hyperlipidemia) No date: Hypertension 06/05/2015: Hypokalemia 06/05/2015: Metabolic syndrome No date: Morbid obesity with BMI of 50.0-59.9, adult (HCC) No date: Prediabetes 06/05/2015: S/P cardiac catheterization, non obstructive disease 11/ 18/16 No date: Sickle cell trait (HCC) No date: Spondylolisthesis of lumbar region No date: Syncope No date: Wears glasses   HPI  Hypertension: Patient here for follow-up of elevated blood pressure. She is exercising and is adherent to low salt diet. Vital signs stable in office today. Cardiac symptoms none. Patient denies chest pain, exertional chest pressure/discomfort, irregular heart beat, and near-syncope.  Cardiovascular risk factors: diabetes mellitus, hypertension, and obesity (BMI >= 30 kg/m2). Use of agents associated with hypertension: none. History of target organ damage: none.   Diabetes Mellitus: Patient presents for follow up of diabetes. Symptoms: none. Patient denies none.  Evaluation to date has been included: hemoglobin A1C.  Home sugars: patient does not check sugars. Treatment to  date: no recent interventions.        Denies f/c/s, n/v/d, hemoptysis, PND, leg swelling Denies chest pain or edema  No Known Allergies  Immunization History  Administered Date(s) Administered   Influenza,inj,Quad PF,6+ Mos 04/28/2014, 07/25/2016   Janssen (J&J) SARS-COV-2 Vaccination 11/03/2019   Pneumococcal Polysaccharide-23 04/28/2014, 03/01/2017   Tdap 09/15/2012    Tobacco History: Social History   Tobacco Use  Smoking Status Never   Passive exposure: Past  Smokeless Tobacco Never   Counseling given: Not Answered   Outpatient Encounter Medications as of 05/04/2023  Medication Sig   amLODipine (NORVASC) 5 MG tablet TAKE 2 TABLETS(10 MG) BY MOUTH DAILY   aspirin EC 81 MG tablet Take 1 tablet (81 mg total) by mouth daily.   atorvastatin (LIPITOR) 40 MG tablet Take 1 tablet (40 mg total) by mouth daily.   carvedilol (COREG) 12.5 MG tablet Take 1 tablet (12.5 mg total) by mouth 2 (two) times daily with a meal.   gabapentin (NEURONTIN) 100 MG capsule TAKE ONE CAPSULE BY MOUTH AT BEDTIME FOR 180 DAYS, THEN 2 CAPSULES AT BEDTIME   spironolactone (ALDACTONE) 25 MG tablet Take 1 tablet (25 mg total) by mouth daily.   [DISCONTINUED] metFORMIN (GLUCOPHAGE) 500 MG tablet Take 1 tablet (500 mg total) by mouth 2 (two) times daily with a meal.   acetaminophen (TYLENOL) 325 MG tablet Take 2 tablets (650 mg total) by mouth every 6 (six) hours as needed. (Patient not taking: Reported on 03/29/2023)   ibuprofen (ADVIL) 600 MG tablet Take 1 tablet (600 mg total)  by mouth every 6 (six) hours as needed. (Patient not taking: Reported on 03/29/2023)   lidocaine (LIDODERM) 5 % Place 1 patch onto the skin daily as needed. Remove & Discard patch within 12 hours or as directed by MD (Patient not taking: Reported on 05/04/2023)   metFORMIN (GLUCOPHAGE) 500 MG tablet Take 1 tablet (500 mg total) by mouth 2 (two) times daily with a meal.   naproxen (NAPROSYN) 375 MG tablet Take 1 tablet (375 mg total) by  mouth 2 (two) times daily. (Patient not taking: Reported on 03/29/2023)   oxyCODONE (ROXICODONE) 5 MG immediate release tablet Take 1 tablet (5 mg total) by mouth every 4 (four) hours as needed for severe pain. (Patient not taking: Reported on 03/29/2023)   polyethylene glycol (MIRALAX) 17 g packet Take 17 g by mouth daily. (Patient not taking: Reported on 03/29/2023)   Replens Vaginal Moisturizer GEL Place 1 Application vaginally every 3 (three) days. (Patient not taking: Reported on 03/29/2023)   No facility-administered encounter medications on file as of 05/04/2023.    Review of Systems  Review of Systems  Constitutional: Negative.   HENT: Negative.    Cardiovascular: Negative.   Gastrointestinal: Negative.   Allergic/Immunologic: Negative.   Neurological: Negative.   Psychiatric/Behavioral: Negative.       Objective:   BP 110/71   Pulse 65   Wt 232 lb 3.2 oz (105.3 kg)   LMP 09/15/2013   SpO2 100%   BMI 43.87 kg/m   Wt Readings from Last 5 Encounters:  05/04/23 232 lb 3.2 oz (105.3 kg)  03/29/23 240 lb (108.9 kg)  02/10/23 234 lb (106.1 kg)  12/21/22 239 lb 9.6 oz (108.7 kg)  12/20/22 239 lb (108.4 kg)     Physical Exam Vitals and nursing note reviewed.  Constitutional:      General: She is not in acute distress.    Appearance: She is well-developed.  Cardiovascular:     Rate and Rhythm: Normal rate and regular rhythm.  Pulmonary:     Effort: Pulmonary effort is normal.     Breath sounds: Normal breath sounds.  Neurological:     Mental Status: She is alert and oriented to person, place, and time.       Assessment & Plan:   Lipid screening -     Lipid panel  Type 2 diabetes mellitus without complication, without long-term current use of insulin (HCC) -     CBC -     Comprehensive metabolic panel -     Lipid panel -     Hemoglobin A1c -     metFORMIN HCl; Take 1 tablet (500 mg total) by mouth 2 (two) times daily with a meal.  Dispense: 90 tablet;  Refill: 3     Return in about 3 months (around 08/04/2023).   Ivonne Andrew, NP 05/04/2023

## 2023-05-05 LAB — COMPREHENSIVE METABOLIC PANEL
ALT: 12 [IU]/L (ref 0–32)
AST: 23 [IU]/L (ref 0–40)
Albumin: 4.4 g/dL (ref 3.9–4.9)
Alkaline Phosphatase: 99 [IU]/L (ref 44–121)
BUN/Creatinine Ratio: 23 (ref 12–28)
BUN: 24 mg/dL (ref 8–27)
Bilirubin Total: 0.3 mg/dL (ref 0.0–1.2)
CO2: 25 mmol/L (ref 20–29)
Calcium: 10.2 mg/dL (ref 8.7–10.3)
Chloride: 101 mmol/L (ref 96–106)
Creatinine, Ser: 1.06 mg/dL — ABNORMAL HIGH (ref 0.57–1.00)
Globulin, Total: 3.2 g/dL (ref 1.5–4.5)
Glucose: 103 mg/dL — ABNORMAL HIGH (ref 70–99)
Potassium: 4.5 mmol/L (ref 3.5–5.2)
Sodium: 141 mmol/L (ref 134–144)
Total Protein: 7.6 g/dL (ref 6.0–8.5)
eGFR: 59 mL/min/{1.73_m2} — ABNORMAL LOW (ref >59)

## 2023-05-05 LAB — LIPID PANEL
Chol/HDL Ratio: 2.6 {ratio} (ref 0.0–4.4)
Cholesterol, Total: 152 mg/dL (ref 100–199)
HDL: 59 mg/dL (ref >39)
LDL Chol Calc (NIH): 82 mg/dL (ref 0–99)
Triglycerides: 51 mg/dL (ref 0–149)
VLDL Cholesterol Cal: 11 mg/dL (ref 5–40)

## 2023-05-05 LAB — CBC
Hematocrit: 35.8 % (ref 34.0–46.6)
Hemoglobin: 11.2 g/dL (ref 11.1–15.9)
MCH: 25.7 pg — ABNORMAL LOW (ref 26.6–33.0)
MCHC: 31.3 g/dL — ABNORMAL LOW (ref 31.5–35.7)
MCV: 82 fL (ref 79–97)
Platelets: 308 10*3/uL (ref 150–450)
RBC: 4.36 x10E6/uL (ref 3.77–5.28)
RDW: 14.1 % (ref 11.7–15.4)
WBC: 4.8 10*3/uL (ref 3.4–10.8)

## 2023-05-05 LAB — HEMOGLOBIN A1C
Est. average glucose Bld gHb Est-mCnc: 134 mg/dL
Hgb A1c MFr Bld: 6.3 % — ABNORMAL HIGH (ref 4.8–5.6)

## 2023-05-07 ENCOUNTER — Telehealth: Payer: Self-pay | Admitting: Nurse Practitioner

## 2023-05-07 NOTE — Telephone Encounter (Signed)
I spoke with pt. See result note.  

## 2023-05-07 NOTE — Telephone Encounter (Signed)
Pt called back about lab results , requesting call back

## 2023-07-03 ENCOUNTER — Other Ambulatory Visit: Payer: Self-pay | Admitting: Nurse Practitioner

## 2023-07-03 DIAGNOSIS — M792 Neuralgia and neuritis, unspecified: Secondary | ICD-10-CM

## 2023-07-04 ENCOUNTER — Other Ambulatory Visit: Payer: Self-pay | Admitting: Nurse Practitioner

## 2023-07-04 DIAGNOSIS — I10 Essential (primary) hypertension: Secondary | ICD-10-CM

## 2023-08-03 ENCOUNTER — Ambulatory Visit: Payer: Self-pay | Admitting: Nurse Practitioner

## 2023-08-13 ENCOUNTER — Telehealth: Payer: Self-pay | Admitting: Internal Medicine

## 2023-08-13 ENCOUNTER — Other Ambulatory Visit: Payer: Self-pay | Admitting: *Deleted

## 2023-08-13 DIAGNOSIS — G5603 Carpal tunnel syndrome, bilateral upper limbs: Secondary | ICD-10-CM

## 2023-08-13 DIAGNOSIS — M7989 Other specified soft tissue disorders: Secondary | ICD-10-CM

## 2023-08-13 NOTE — Telephone Encounter (Signed)
Pt called asking for a referral to a hand specialist to see if they could do anything about her carpal tunnel    Patient contacted the office requesting a referral to hand specialist.  Patient request referral for both hand pain.  Patient's preferred area for referral is: [facility]

## 2023-08-17 ENCOUNTER — Ambulatory Visit (INDEPENDENT_AMBULATORY_CARE_PROVIDER_SITE_OTHER): Payer: 59 | Admitting: Nurse Practitioner

## 2023-08-17 ENCOUNTER — Encounter: Payer: Self-pay | Admitting: Nurse Practitioner

## 2023-08-17 VITALS — BP 123/72 | HR 67 | Temp 97.5°F | Wt 237.0 lb

## 2023-08-17 DIAGNOSIS — E119 Type 2 diabetes mellitus without complications: Secondary | ICD-10-CM

## 2023-08-17 DIAGNOSIS — E7849 Other hyperlipidemia: Secondary | ICD-10-CM

## 2023-08-17 LAB — POCT GLYCOSYLATED HEMOGLOBIN (HGB A1C): Hemoglobin A1C: 6 % — AB (ref 4.0–5.6)

## 2023-08-17 MED ORDER — ATORVASTATIN CALCIUM 40 MG PO TABS: 40.0000 mg | ORAL_TABLET | Freq: Every day | ORAL | 2 refills | Status: DC

## 2023-08-17 NOTE — Patient Instructions (Signed)
1. Type 2 diabetes mellitus without complication, without long-term current use of insulin (HCC) (Primary)  - POCT glycosylated hemoglobin (Hb A1C) - Comprehensive metabolic panel  2. Other hyperlipidemia  - atorvastatin (LIPITOR) 40 MG tablet; Take 1 tablet (40 mg total) by mouth daily.  Dispense: 90 tablet; Refill: 2   Follow up:  Follow up in 3 months

## 2023-08-17 NOTE — Progress Notes (Signed)
Subjective   Patient ID: Hannah Huerta, female    DOB: [birth year], 63 y.o.   MRN: [id number]  Chief Complaint  Patient presents with   Diabetes    Follow up   Medication Refill    Atorvastatin refill needed    Referring provider: Ivonne Andrew, NP  Hannah Huerta is a 63 y.o. female with Past Medical History: No date: Abnormal Pap smear of cervix     Comment:  pt couldnt state what type of abnormality No date: Anemia No date: Arthritis 06/05/2015: CAD in native artery No date: Carpal tunnel syndrome, bilateral No date: Chest pain     Comment:  a. 02/2012 abnl myoview - inferoapical reverisbility               concerning for ischemia, EF 59%;   02/2012 nonobstructive               cath No date: CHF (congestive heart failure) (HCC) No date: Chronic migraine No date: Diabetes mellitus without complication (HCC) No date: GERD (gastroesophageal reflux disease) No date: HLD (hyperlipidemia) No date: Hypertension 06/05/2015: Hypokalemia 06/05/2015: Metabolic syndrome No date: Morbid obesity with BMI of 50.0-59.9, adult (HCC) No date: Prediabetes 06/05/2015: S/P cardiac catheterization, non obstructive disease 11/ 18/16 No date: Sickle cell trait (HCC) No date: Spondylolisthesis of lumbar region No date: Syncope No date: Wears glasses   HPI  Hypertension: Patient here for follow-up of elevated blood pressure. She is exercising and is adherent to low salt diet. Vital signs stable in office today. Cardiac symptoms none. Patient denies chest pain, exertional chest pressure/discomfort, irregular heart beat, and near-syncope.  Cardiovascular risk factors: diabetes mellitus, hypertension, and obesity (BMI >= 30 kg/m2). Use of agents associated with hypertension: none. History of target organ damage: none.    Diabetes Mellitus: Patient presents for follow up of diabetes. Symptoms: none. Patient denies none.  Evaluation to date has been included: hemoglobin A1C.  Home sugars:  patient does not check sugars. Treatment to date: no recent interventions.  A1C in office today is 6.0.       Denies f/c/s, n/v/d, hemoptysis, PND, leg swelling Denies chest pain or edema    No Known Allergies  Immunization History  Administered Date(s) Administered   Influenza,inj,Quad PF,6+ Mos 04/28/2014, 07/25/2016   Janssen (J&J) SARS-COV-2 Vaccination 11/03/2019   Pneumococcal Polysaccharide-23 04/28/2014, 03/01/2017   Tdap 09/15/2012    Tobacco History: Social History   Tobacco Use  Smoking Status Never   Passive exposure: Past  Smokeless Tobacco Never   Counseling given: Not Answered   Outpatient Encounter Medications as of 08/17/2023  Medication Sig   acetaminophen (TYLENOL) 325 MG tablet Take 2 tablets (650 mg total) by mouth every 6 (six) hours as needed.   amLODipine (NORVASC) 5 MG tablet TAKE 2 TABLETS(10 MG) BY MOUTH DAILY   aspirin EC 81 MG tablet Take 1 tablet (81 mg total) by mouth daily.   carvedilol (COREG) 12.5 MG tablet TAKE 1 TABLET(12.5 MG) BY MOUTH TWICE DAILY WITH A MEAL   ibuprofen (ADVIL) 600 MG tablet Take 1 tablet (600 mg total) by mouth every 6 (six) hours as needed.   metFORMIN (GLUCOPHAGE) 500 MG tablet Take 1 tablet (500 mg total) by mouth 2 (two) times daily with a meal.   spironolactone (ALDACTONE) 25 MG tablet Take 1 tablet (25 mg total) by mouth daily.   [DISCONTINUED] atorvastatin (LIPITOR) 40 MG tablet Take 1 tablet (40 mg total) by mouth daily.   atorvastatin (LIPITOR)  40 MG tablet Take 1 tablet (40 mg total) by mouth daily.   gabapentin (NEURONTIN) 100 MG capsule TAKE ONE CAPSULE BY MOUTH AT BEDTIME FOR 180 DAYS, THEN 2 CAPSULES AT BEDTIME (Patient not taking: Reported on 08/17/2023)   lidocaine (LIDODERM) 5 % Place 1 patch onto the skin daily as needed. Remove & Discard patch within 12 hours or as directed by MD (Patient not taking: Reported on 08/17/2023)   naproxen (NAPROSYN) 375 MG tablet Take 1 tablet (375 mg total) by mouth 2 (two)  times daily. (Patient not taking: Reported on 08/17/2023)   oxyCODONE (ROXICODONE) 5 MG immediate release tablet Take 1 tablet (5 mg total) by mouth every 4 (four) hours as needed for severe pain. (Patient not taking: Reported on 08/17/2023)   polyethylene glycol (MIRALAX) 17 g packet Take 17 g by mouth daily. (Patient not taking: Reported on 08/17/2023)   Replens Vaginal Moisturizer GEL Place 1 Application vaginally every 3 (three) days. (Patient not taking: Reported on 08/17/2023)   No facility-administered encounter medications on file as of 08/17/2023.    Review of Systems  Review of Systems  Constitutional: Negative.   HENT: Negative.    Cardiovascular: Negative.   Gastrointestinal: Negative.   Allergic/Immunologic: Negative.   Neurological: Negative.   Psychiatric/Behavioral: Negative.       Objective:   BP 123/72   Pulse 67   Temp (!) 97.5 F (36.4 C)   Wt 237 lb (107.5 kg)   LMP 09/15/2013   SpO2 100%   BMI 44.78 kg/m   Wt Readings from Last 5 Encounters:  08/17/23 237 lb (107.5 kg)  05/04/23 232 lb 3.2 oz (105.3 kg)  03/29/23 240 lb (108.9 kg)  02/10/23 234 lb (106.1 kg)  12/21/22 239 lb 9.6 oz (108.7 kg)     Physical Exam Vitals and nursing note reviewed.  Constitutional:      General: She is not in acute distress.    Appearance: She is well-developed.  Cardiovascular:     Rate and Rhythm: Normal rate and regular rhythm.  Pulmonary:     Effort: Pulmonary effort is normal.     Breath sounds: Normal breath sounds.  Neurological:     Mental Status: She is alert and oriented to person, place, and time.       Assessment & Plan:   Type 2 diabetes mellitus without complication, without long-term current use of insulin (HCC) -     POCT glycosylated hemoglobin (Hb A1C) -     Comprehensive metabolic panel  Other hyperlipidemia -     Atorvastatin Calcium; Take 1 tablet (40 mg total) by mouth daily.  Dispense: 90 tablet; Refill: 2     Return in about 3  months (around 11/14/2023).   Ivonne Andrew, NP 08/17/2023

## 2023-08-18 LAB — COMPREHENSIVE METABOLIC PANEL
ALT: 9 [IU]/L (ref 0–32)
AST: 19 [IU]/L (ref 0–40)
Albumin: 4.1 g/dL (ref 3.9–4.9)
Alkaline Phosphatase: 89 [IU]/L (ref 44–121)
BUN/Creatinine Ratio: 13 (ref 12–28)
BUN: 12 mg/dL (ref 8–27)
Bilirubin Total: 0.3 mg/dL (ref 0.0–1.2)
CO2: 25 mmol/L (ref 20–29)
Calcium: 9.8 mg/dL (ref 8.7–10.3)
Chloride: 103 mmol/L (ref 96–106)
Creatinine, Ser: 0.92 mg/dL (ref 0.57–1.00)
Globulin, Total: 3 g/dL (ref 1.5–4.5)
Glucose: 96 mg/dL (ref 70–99)
Potassium: 4 mmol/L (ref 3.5–5.2)
Sodium: 143 mmol/L (ref 134–144)
Total Protein: 7.1 g/dL (ref 6.0–8.5)
eGFR: 70 mL/min/{1.73_m2} (ref >59)

## 2023-08-19 ENCOUNTER — Encounter (HOSPITAL_COMMUNITY): Payer: Self-pay

## 2023-08-19 ENCOUNTER — Other Ambulatory Visit: Payer: Self-pay

## 2023-08-19 ENCOUNTER — Emergency Department (HOSPITAL_COMMUNITY)
Admission: EM | Admit: 2023-08-19 | Discharge: 2023-08-19 | Disposition: A | Discharge status: Home / Self Care | Payer: 59 | Attending: Emergency Medicine | Admitting: Emergency Medicine

## 2023-08-19 ENCOUNTER — Emergency Department (HOSPITAL_COMMUNITY): Payer: 59

## 2023-08-19 DIAGNOSIS — Z7982 Long term (current) use of aspirin: Secondary | ICD-10-CM | POA: Diagnosis not present

## 2023-08-19 DIAGNOSIS — E876 Hypokalemia: Secondary | ICD-10-CM | POA: Insufficient documentation

## 2023-08-19 DIAGNOSIS — R42 Dizziness and giddiness: Secondary | ICD-10-CM | POA: Diagnosis not present

## 2023-08-19 DIAGNOSIS — R0602 Shortness of breath: Secondary | ICD-10-CM | POA: Insufficient documentation

## 2023-08-19 DIAGNOSIS — R072 Precordial pain: Secondary | ICD-10-CM | POA: Insufficient documentation

## 2023-08-19 LAB — BASIC METABOLIC PANEL
Anion gap: 11 (ref 5–15)
BUN: 13 mg/dL (ref 8–23)
CO2: 26 mmol/L (ref 22–32)
Calcium: 9.1 mg/dL (ref 8.9–10.3)
Chloride: 104 mmol/L (ref 98–111)
Creatinine, Ser: 1.01 mg/dL — ABNORMAL HIGH (ref 0.44–1.00)
GFR, Estimated: >60 mL/min (ref >60)
Glucose, Bld: 117 mg/dL — ABNORMAL HIGH (ref 70–99)
Potassium: 3.4 mmol/L — ABNORMAL LOW (ref 3.5–5.1)
Sodium: 141 mmol/L (ref 135–145)

## 2023-08-19 LAB — TROPONIN I (HIGH SENSITIVITY)
Troponin I (High Sensitivity): 8 ng/L (ref <18)
Troponin I (High Sensitivity): 8 ng/L (ref <18)

## 2023-08-19 LAB — CBC
HCT: 31.9 % — ABNORMAL LOW (ref 36.0–46.0)
Hemoglobin: 10.4 g/dL — ABNORMAL LOW (ref 12.0–15.0)
MCH: 25.6 pg — ABNORMAL LOW (ref 26.0–34.0)
MCHC: 32.6 g/dL (ref 30.0–36.0)
MCV: 78.4 fL — ABNORMAL LOW (ref 80.0–100.0)
Platelets: 263 10*3/uL (ref 150–400)
RBC: 4.07 MIL/uL (ref 3.87–5.11)
RDW: 15 % (ref 11.5–15.5)
WBC: 4.9 10*3/uL (ref 4.0–10.5)
nRBC: 0 % (ref 0.0–0.2)

## 2023-08-19 MED ORDER — POTASSIUM CHLORIDE CRYS ER 20 MEQ PO TBCR
40.0000 meq | EXTENDED_RELEASE_TABLET | Freq: Once | ORAL | Status: AC
  Administered 2023-08-19: 40 meq via ORAL
  Filled 2023-08-19: qty 2

## 2023-08-19 MED ORDER — ALUM & MAG HYDROXIDE-SIMETH 200-200-20 MG/5ML PO SUSP
30.0000 mL | Freq: Once | ORAL | Status: AC
  Administered 2023-08-19: 30 mL via ORAL
  Filled 2023-08-19: qty 30

## 2023-08-19 MED ORDER — ACETAMINOPHEN 500 MG PO TABS
1000.0000 mg | ORAL_TABLET | Freq: Once | ORAL | Status: AC
  Administered 2023-08-19: 1000 mg via ORAL
  Filled 2023-08-19: qty 2

## 2023-08-19 MED ORDER — FAMOTIDINE 20 MG PO TABS
20.0000 mg | ORAL_TABLET | Freq: Once | ORAL | Status: AC
  Administered 2023-08-19: 20 mg via ORAL
  Filled 2023-08-19: qty 1

## 2023-08-19 NOTE — ED Triage Notes (Addendum)
Pt states she has carpal tunnel on left side and felt like her left hand had been hurting because of this for past few weeks. Pt states the pain has started traveling up her arm and into her chest, states pain worsened yesterday. Pt has had shortness of breath, lightheadedness and nausea.

## 2023-08-19 NOTE — ED Provider Notes (Signed)
[facility] AT [facility] Provider Note   CSN: [id number] Arrival date & time: 08/19/23  4098     History  Chief Complaint  Patient presents with   Chest Pain    Hannah Huerta is a 63 y.o. female.  Pt with c/o mid chest pain at rest since yesterday, initially had pain in left wrist area for past couple weeks, hx carpal tunnel. But says in past day pain in midline, lower chest. Not pleuritic, not radiating. Occurs at rest, no relation to exertion or activity. No change whether upright or supine. No associated cough, sob, diaphoresis or vomiting. Some recent heartburn/indigestion type feelings. No recent unusual fatigue or doe. Hx non obstructive cad on prior many yrs ago. Denies new or unilateral leg pain/swelling, no recent trauma/immobility/travel. No cough, fever or uri symptoms.   The history is provided by the patient and medical records.  Chest Pain Associated symptoms: no abdominal pain, no back pain, no cough, no fever, no headache, no palpitations, no shortness of breath and no vomiting        Home Medications Prior to Admission medications   Medication Sig Start Date End Date Taking? Authorizing Provider  acetaminophen (TYLENOL) 325 MG tablet Take 2 tablets (650 mg total) by mouth every 6 (six) hours as needed. 02/10/23   Tanda Rockers A, DO  amLODipine (NORVASC) 5 MG tablet TAKE 2 TABLETS(10 MG) BY MOUTH DAILY 01/29/23   Ivonne Andrew, NP  aspirin EC 81 MG tablet Take 1 tablet (81 mg total) by mouth daily. 03/07/17   Massie Maroon, FNP  atorvastatin (LIPITOR) 40 MG tablet Take 1 tablet (40 mg total) by mouth daily. 08/17/23   Ivonne Andrew, NP  carvedilol (COREG) 12.5 MG tablet TAKE 1 TABLET(12.5 MG) BY MOUTH TWICE DAILY WITH A MEAL 07/04/23   Ivonne Andrew, NP  gabapentin (NEURONTIN) 100 MG capsule TAKE ONE CAPSULE BY MOUTH AT BEDTIME FOR 180 DAYS, THEN 2 CAPSULES AT BEDTIME Patient not taking: Reported on 08/17/2023 07/04/23    Ivonne Andrew, NP  ibuprofen (ADVIL) 600 MG tablet Take 1 tablet (600 mg total) by mouth every 6 (six) hours as needed. 02/10/23   Tanda Rockers A, DO  lidocaine (LIDODERM) 5 % Place 1 patch onto the skin daily as needed. Remove & Discard patch within 12 hours or as directed by MD Patient not taking: Reported on 08/17/2023 02/10/23   Tanda Rockers A, DO  metFORMIN (GLUCOPHAGE) 500 MG tablet Take 1 tablet (500 mg total) by mouth 2 (two) times daily with a meal. 05/04/23 10/31/23  Ivonne Andrew, NP  naproxen (NAPROSYN) 375 MG tablet Take 1 tablet (375 mg total) by mouth 2 (two) times daily. Patient not taking: Reported on 08/17/2023 09/01/21   Redwine, Madison A, PA-C  oxyCODONE (ROXICODONE) 5 MG immediate release tablet Take 1 tablet (5 mg total) by mouth every 4 (four) hours as needed for severe pain. Patient not taking: Reported on 08/17/2023 02/10/23   Tanda Rockers A, DO  polyethylene glycol (MIRALAX) 17 g packet Take 17 g by mouth daily. Patient not taking: Reported on 08/17/2023 02/25/23   Raspet, Noberto Retort, PA-C  Replens Vaginal Moisturizer GEL Place 1 Application vaginally every 3 (three) days. Patient not taking: Reported on 08/17/2023 12/26/22   Ivonne Andrew, NP  spironolactone (ALDACTONE) 25 MG tablet Take 1 tablet (25 mg total) by mouth daily. 11/03/22 11/03/23  Ivonne Andrew, NP      Allergies  Patient has no known allergies.    Review of Systems   Review of Systems  Constitutional:  Negative for chills and fever.  HENT:  Negative for sore throat.   Eyes:  Negative for redness.  Respiratory:  Negative for cough and shortness of breath.   Cardiovascular:  Positive for chest pain. Negative for palpitations.  Gastrointestinal:  Negative for abdominal pain and vomiting.  Genitourinary:  Negative for flank pain.  Musculoskeletal:  Negative for back pain and neck pain.  Skin:  Negative for rash.  Neurological:  Negative for headaches.  Hematological:  Does not bruise/bleed easily.   Psychiatric/Behavioral:  Negative for confusion.     Physical Exam Updated Vital Signs BP (!) 125/55   Pulse (!) 55   Temp 98.4 F (36.9 C)   Resp 13   Ht 1.575 m (5\' 2" )   Wt 107.5 kg   LMP 09/15/2013   SpO2 100%   BMI 43.35 kg/m  Physical Exam Vitals and nursing note reviewed.  Constitutional:      Appearance: Normal appearance. She is well-developed.  HENT:     Head: Atraumatic.     Nose: Nose normal.     Mouth/Throat:     Mouth: Mucous membranes are moist.  Eyes:     General: No scleral icterus.    Conjunctiva/sclera: Conjunctivae normal.  Neck:     Trachea: No tracheal deviation.  Cardiovascular:     Rate and Rhythm: Normal rate and regular rhythm.     Pulses: Normal pulses.     Heart sounds: Normal heart sounds. No murmur heard.    No friction rub. No gallop.  Pulmonary:     Effort: Pulmonary effort is normal. No respiratory distress.     Breath sounds: Normal breath sounds.  Chest:     Chest wall: No tenderness.  Abdominal:     General: Bowel sounds are normal. There is no distension.     Palpations: Abdomen is soft.     Tenderness: There is no abdominal tenderness.  Genitourinary:    Comments: No cva tenderness.  Musculoskeletal:        General: No swelling or tenderness.     Cervical back: Normal range of motion and neck supple. No rigidity. No muscular tenderness.     Right lower leg: No edema.     Left lower leg: No edema.  Skin:    General: Skin is warm and dry.     Findings: No rash.  Neurological:     Mental Status: She is alert.     Comments: Alert, speech normal.   Psychiatric:        Mood and Affect: Mood normal.     ED Results / Procedures / Treatments   Labs (all labs ordered are listed, but only abnormal results are displayed) Results for orders placed or performed during the hospital encounter of 08/19/23  Basic metabolic panel   Collection Time: 08/19/23  4:59 AM  Result Value Ref Range   Sodium 141 135 - 145 mmol/L    Potassium 3.4 (L) 3.5 - 5.1 mmol/L   Chloride 104 98 - 111 mmol/L   CO2 26 22 - 32 mmol/L   Glucose, Bld 117 (H) 70 - 99 mg/dL   BUN 13 8 - 23 mg/dL   Creatinine, Ser 2.95 (H) 0.44 - 1.00 mg/dL   Calcium 9.1 8.9 - 62.1 mg/dL   GFR, Estimated >30 >86 mL/min   Anion gap 11 5 - 15  CBC  Collection Time: 08/19/23  4:59 AM  Result Value Ref Range   WBC 4.9 4.0 - 10.5 K/uL   RBC 4.07 3.87 - 5.11 MIL/uL   Hemoglobin 10.4 (L) 12.0 - 15.0 g/dL   HCT 44.0 (L) 10.2 - 72.5 %   MCV 78.4 (L) 80.0 - 100.0 fL   MCH 25.6 (L) 26.0 - 34.0 pg   MCHC 32.6 30.0 - 36.0 g/dL   RDW 36.6 44.0 - 34.7 %   Platelets 263 150 - 400 K/uL   nRBC 0.0 0.0 - 0.2 %  Troponin I (High Sensitivity)   Collection Time: 08/19/23  4:59 AM  Result Value Ref Range   Troponin I (High Sensitivity) 8 <18 ng/L  Troponin I (High Sensitivity)   Collection Time: 08/19/23  6:47 AM  Result Value Ref Range   Troponin I (High Sensitivity) 8 <18 ng/L   DG Chest 2 View Result Date: 08/19/2023 CLINICAL DATA:  63 year old female with history of chest pain. EXAM: CHEST - 2 VIEW COMPARISON:  Chest x-ray 06/03/2018. FINDINGS: Lung volumes are normal. No consolidative airspace disease. No pleural effusions. No pneumothorax. No pulmonary nodule or mass noted. Pulmonary vasculature and the cardiomediastinal silhouette are within normal limits. Atherosclerosis in the thoracic aorta. IMPRESSION: 1.  No radiographic evidence of acute cardiopulmonary disease. 2. Aortic atherosclerosis. Electronically Signed   By: Trudie Reed M.D.   On: 08/19/2023 05:49    EKG EKG Interpretation Date/Time:  Sunday August 19 2023 05:00:53 EST Ventricular Rate:  75 PR Interval:  166 QRS Duration:  86 QT Interval:  386 QTC Calculation: 431 R Axis:   -33  Text Interpretation: Normal sinus rhythm Left axis deviation No significant change since last tracing Confirmed by Cathren Laine ([id number]) on 08/19/2023 6:49:37 AM  Radiology DG Chest 2 View Result Date:  08/19/2023 CLINICAL DATA:  63 year old female with history of chest pain. EXAM: CHEST - 2 VIEW COMPARISON:  Chest x-ray 06/03/2018. FINDINGS: Lung volumes are normal. No consolidative airspace disease. No pleural effusions. No pneumothorax. No pulmonary nodule or mass noted. Pulmonary vasculature and the cardiomediastinal silhouette are within normal limits. Atherosclerosis in the thoracic aorta. IMPRESSION: 1.  No radiographic evidence of acute cardiopulmonary disease. 2. Aortic atherosclerosis. Electronically Signed   By: Trudie Reed M.D.   On: 08/19/2023 05:49    Procedures Procedures    Medications Ordered in ED Medications  famotidine (PEPCID) tablet 20 mg (20 mg Oral Given 08/19/23 0746)  alum & mag hydroxide-simeth (MAALOX/MYLANTA) 200-200-20 MG/5ML suspension 30 mL (30 mLs Oral Given 08/19/23 0746)  acetaminophen (TYLENOL) tablet 1,000 mg (1,000 mg Oral Given 08/19/23 0746)  potassium chloride SA (KLOR-CON M) CR tablet 40 mEq (40 mEq Oral Given 08/19/23 0746)    ED Course/ Medical Decision Making/ A&P                                 Medical Decision Making Problems Addressed: Hypokalemia: acute illness or injury Precordial chest pain: acute illness or injury with systemic symptoms that poses a threat to life or bodily functions  Amount and/or Complexity of Data Reviewed External Data Reviewed: notes. Labs: ordered. Decision-making details documented in ED Course. Radiology: ordered and independent interpretation performed. Decision-making details documented in ED Course. ECG/medicine tests: ordered and independent interpretation performed. Decision-making details documented in ED Course.  Risk OTC drugs. Prescription drug management. Decision regarding hospitalization.   Iv ns. Continuous pulse ox and cardiac monitoring. Labs ordered/sent.  Imaging ordered.   Differential diagnosis includes acs, msk cp, gi cp, etc. Dispo decision including potential need for admission  considered - will get labs and imaging and reassess.   Reviewed nursing notes and prior charts for additional history. External reports reviewed.   Pepcid po, acetaminophen po, maalox po.   Cardiac monitor: sinus rhythm, rate 66.  Labs reviewed/interpreted by me - trop normal. K sl low. Kcl po.   Xrays reviewed/interpreted by me - no pna.   Recheck, hr 62, rr 14, pulse ox 100% room air. No chest pain.  Additional labs reviewed/interpreted by me - delta trop normal and not increasing, felt not c/w acs.   Pt currently appears stable for d/c.   Rec close cardiology/pcp f/u.  Return precautions provided.          Final Clinical Impression(s) / ED Diagnoses Final diagnoses:  Precordial chest pain  Hypokalemia    Rx / DC Orders ED Discharge Orders          Ordered    Ambulatory referral to Cardiology       Comments: If you have not heard from the Cardiology office within the next 72 hours please call [phone].   08/19/23 2706              Cathren Laine, MD 08/19/23 [phone]

## 2023-08-19 NOTE — Discharge Instructions (Signed)
It was our pleasure to provide your ER care today - we hope that you feel better.  If GI/reflux symptoms, try taking pepcid and maalox as need for symptom relief.   For recent chest pain, follow up closely with cardiologist in the next 1-2 weeks - we made referral, and they should be contacting you with an appointment in the next few days.   Your potassium level is mildly low - eat plenty of fruits and vegetables, and follow up with your doctor.   Return to ER right away if worse, new symptoms, fevers, recurrent/persistent chest pain, increased trouble breathing, or other concern.

## 2023-08-20 ENCOUNTER — Telehealth: Payer: Self-pay | Admitting: Nurse Practitioner

## 2023-08-20 ENCOUNTER — Telehealth: Payer: Self-pay

## 2023-08-20 NOTE — Telephone Encounter (Signed)
Copied from CRM [id number]. Topic: General - Other >> Aug 20, 2023  2:50 PM Dennison Nancy wrote: Reason for CRM: Patient returning Charice call regarding her most recent visit as inpatient /ED visit

## 2023-08-20 NOTE — Transitions of Care (Post Inpatient/ED Visit) (Unsigned)
   08/20/2023  Name: Hannah Huerta MRN: [id number] DOB: [birth year]  Today's TOC FU Call Status: Today's TOC FU Call Status:: Unsuccessful Call (1st Attempt) Unsuccessful Call (1st Attempt) Date: 08/20/23  Attempted to reach the patient regarding the most recent Inpatient/ED visit.  Follow Up Plan: Additional outreach attempts will be made to reach the patient to complete the Transitions of Care (Post Inpatient/ED visit) call.   Signature  [employer], [place]

## 2023-08-21 ENCOUNTER — Telehealth: Payer: Self-pay

## 2023-08-21 NOTE — Transitions of Care (Post Inpatient/ED Visit) (Signed)
 08/21/2023  Name: Hannah Huerta MRN: [id number] DOB: 1960/12/30  Today's TOC FU Call Status:   Patient's Name and Date of Birth confirmed.  Transition Care Management Follow-up Telephone Call Date of Discharge: 08/19/23 Discharge Facility: [facility]) Type of Discharge: Emergency Department Reason for ED Visit: Respiratory, Medication Changes How have you been since you were released from the hospital?: Better Any questions or concerns?: No  Items Reviewed: Did you receive and understand the discharge instructions provided?: Yes Medications obtained,verified, and reconciled?: Yes (Medications Reviewed) Any new allergies since your discharge?: No Dietary orders reviewed?: No Do you have support at home?: Yes People in Home: child(ren), adult  Medications Reviewed Today: Medications Reviewed Today     Reviewed by Starlene Charlynn BIRCH, CMA (Certified Medical Assistant) on 08/21/23 at [id number]  Med List Status: <None>   Medication Order Taking? Sig Documenting Provider Last Dose Status Informant  acetaminophen  (TYLENOL ) 325 MG tablet [id number] Yes Take 2 tablets (650 mg total) by mouth every 6 (six) hours as needed. Elnor Jayson LABOR, DO Taking Active   amLODipine  (NORVASC ) 5 MG tablet [id number] Yes TAKE 2 TABLETS(10 MG) BY MOUTH DAILY Nichols, Tonya S, NP Taking Active   aspirin  EC 81 MG tablet [id number] Yes Take 1 tablet (81 mg total) by mouth daily. Tilford Bertram HERO, FNP Taking Active Self           Med Note TOBY, SHARON H   Thu Sep 22, 2021  8:05 AM)    atorvastatin  (LIPITOR ) 40 MG tablet [id number] Yes Take 1 tablet (40 mg total) by mouth daily. Oley Bascom RAMAN, NP Taking Active   carvedilol  (COREG ) 12.5 MG tablet [id number] Yes TAKE 1 TABLET(12.5 MG) BY MOUTH TWICE DAILY WITH A MEAL Nichols, Tonya S, NP Taking Active   gabapentin  (NEURONTIN ) 100 MG capsule [id number] No TAKE ONE CAPSULE BY MOUTH AT BEDTIME FOR 180 DAYS, THEN 2 CAPSULES AT BEDTIME  Patient not taking: Reported on  08/21/2023   Oley Bascom RAMAN, NP Not Taking Active   ibuprofen  (ADVIL ) 600 MG tablet [id number] Yes Take 1 tablet (600 mg total) by mouth every 6 (six) hours as needed. Elnor Jayson LABOR, DO Taking Active   lidocaine  (LIDODERM ) 5 % [id number] Yes Place 1 patch onto the skin daily as needed. Remove & Discard patch within 12 hours or as directed by MD Elnor Jayson LABOR, DO Taking Active   metFORMIN  (GLUCOPHAGE ) 500 MG tablet [id number] Yes Take 1 tablet (500 mg total) by mouth 2 (two) times daily with a meal. Oley Bascom RAMAN, NP Taking Active   naproxen  (NAPROSYN ) 375 MG tablet [id number] No Take 1 tablet (375 mg total) by mouth 2 (two) times daily.  Patient not taking: Reported on 03/29/2023   Redwine, Madison A, PA-C Not Taking Active   oxyCODONE  (ROXICODONE ) 5 MG immediate release tablet [id number] No Take 1 tablet (5 mg total) by mouth every 4 (four) hours as needed for severe pain.  Patient not taking: Reported on 03/29/2023   Elnor Jayson LABOR, DO Not Taking Active   polyethylene glycol (MIRALAX ) 17 g packet [id number] No Take 17 g by mouth daily.  Patient not taking: Reported on 03/29/2023   Sherrell Rocky POUR, PA-C Not Taking Active   Replens Vaginal Moisturizer GEL [id number] No Place 1 Application vaginally every 3 (three) days.  Patient not taking: Reported on 03/29/2023   Oley Bascom RAMAN, NP Not Taking Active   spironolactone  (ALDACTONE ) 25 MG tablet [id number] Yes Take 1 tablet (25 mg total)  by mouth daily. Oley Bascom RAMAN, NP Taking Active             Home Care and Equipment/Supplies: Were Home Health Services Ordered?: No Any new equipment or medical supplies ordered?: No  Functional Questionnaire: Do you need assistance with bathing/showering or dressing?: No Do you need assistance with meal preparation?: No Do you need assistance with eating?: No Do you have difficulty maintaining continence: No Do you need assistance with getting out of bed/getting out of a chair/moving?: No Do you have  difficulty managing or taking your medications?: No  Follow up appointments reviewed: PCP Follow-up appointment confirmed?: Yes Date of PCP follow-up appointment?: 08/31/23 Follow-up Provider: Bascom Oley Specialist [facility] Follow-up appointment confirmed?: NA Do you need transportation to your follow-up appointment?: No Do you understand care options if your condition(s) worsen?: Yes-patient verbalized understanding    SIGNATURE Zylee Marchiano, CMA

## 2023-08-28 ENCOUNTER — Ambulatory Visit (INDEPENDENT_AMBULATORY_CARE_PROVIDER_SITE_OTHER): Payer: 59 | Admitting: Physical Medicine and Rehabilitation

## 2023-08-28 DIAGNOSIS — M79641 Pain in right hand: Secondary | ICD-10-CM | POA: Diagnosis not present

## 2023-08-28 DIAGNOSIS — M79642 Pain in left hand: Secondary | ICD-10-CM

## 2023-08-28 DIAGNOSIS — R202 Paresthesia of skin: Secondary | ICD-10-CM

## 2023-08-28 DIAGNOSIS — M25532 Pain in left wrist: Secondary | ICD-10-CM

## 2023-08-28 DIAGNOSIS — R531 Weakness: Secondary | ICD-10-CM | POA: Diagnosis not present

## 2023-08-28 DIAGNOSIS — M25531 Pain in right wrist: Secondary | ICD-10-CM

## 2023-08-28 NOTE — Progress Notes (Signed)
Patient is right hand dominant  She is advising she is unable to grasp things bilateral with noted weakness and tingling in both hands.

## 2023-08-30 NOTE — Procedures (Signed)
EMG & NCV Findings: Evaluation of the left median motor nerve showed reduced amplitude (0.8 mV) and decreased conduction velocity (Elbow-Wrist, 13 m/s).  The right median motor nerve showed prolonged distal onset latency (7.7 ms), reduced amplitude (0.3 mV), and decreased conduction velocity (Elbow-Wrist, 37 m/s).  The left median (across palm) sensory nerve showed prolonged distal peak latency (Wrist, 8.1 ms) and prolonged distal peak latency (Palm, 6.0 ms).  The right median (across palm) sensory nerve showed no response (Wrist) and no response (Palm).  The right ulnar sensory nerve showed prolonged distal peak latency (3.9 ms) and decreased conduction velocity (Wrist-5th Digit, 36 m/s).  All remaining nerves (as indicated in the following tables) were within normal limits.  Left vs. Right side comparison data for the median motor nerve indicates abnormal L-R latency difference (3.9 ms), abnormal L-R amplitude difference (62.5 %), and abnormal L-R velocity difference (Elbow-Wrist, 24 m/s).  The ulnar motor nerve indicates abnormal L-R velocity difference (A Elbow-B Elbow, 21 m/s).  All remaining left vs. right side differences were within normal limits.    Needle evaluation of the left abductor pollicis brevis muscle showed increased insertional activity, widespread spontaneous activity, and diminished recruitment.  All remaining muscles (as indicated in the following table) showed no evidence of electrical instability.    Impression: The above electrodiagnostic study is ABNORMAL and reveals evidence of a severe bilateral median nerve entrapment at the wrist (carpal tunnel syndrome) affecting sensory and motor components. The lesion is characterized by sensory and motor demyelination with evidence of axonal injury.  Interestingly, the normal value for the peak onset of the left median motor study is likely erroneous secondary to abnormal waveform appearance.  There is no significant electrodiagnostic  evidence of any other focal nerve entrapment, brachial plexopathy or cervical radiculopathy.   Recommendations: 1.  Follow-up with referring physician. 2.  Continue current management of symptoms. 3.  Suggest surgical evaluation.  Despite appropriate decompressive treatment she will likely have some residual symptoms.  ___________________________ Hannah Huerta FAAPMR Board Certified, American Board of Physical Medicine and Rehabilitation    Nerve Conduction Studies Anti Sensory Summary Table   Stim Site NR Peak (ms) Norm Peak (ms) P-T Amp (V) Norm P-T Amp Site1 Site2 Delta-P (ms) Dist (cm) Vel (m/s) Norm Vel (m/s)  Left Median Acr Palm Anti Sensory (2nd Digit)  32.5C  Wrist    *8.1 <3.6 11.6 >10 Wrist Palm 2.1 0.0    Palm    *6.0 <2.0 0.6         Right Median Acr Palm Anti Sensory (2nd Digit)  31.1C  Wrist *NR  <3.6  >10 Wrist Palm  0.0    Palm *NR  <2.0          Left Radial Anti Sensory (Base 1st Digit)  32.2C  Wrist    2.2 <3.1 30.5  Wrist Base 1st Digit 2.2 0.0    Right Radial Anti Sensory (Base 1st Digit)  30.9C  Wrist    2.6 <3.1 8.5  Wrist Base 1st Digit 2.6 0.0    Left Ulnar Anti Sensory (5th Digit)  32.8C  Wrist    3.7 <3.7 20.9 >15.0 Wrist 5th Digit 3.7 14.0 38 >38  Right Ulnar Anti Sensory (5th Digit)  31.3C  Wrist    *3.9 <3.7 24.1 >15.0 Wrist 5th Digit 3.9 14.0 *36 >38   Motor Summary Table   Stim Site NR Onset (ms) Norm Onset (ms) O-P Amp (mV) Norm O-P Amp Site1 Site2 Delta-0 (ms) Dist (cm)  Vel (m/s) Norm Vel (m/s)  Left Median Motor (Abd Poll Brev)  32.5C  Wrist    3.8 <4.2 *0.8 >5 Elbow Wrist 15.9 20.5 *13 >50  Elbow    19.7  0.2         Right Median Motor (Abd Poll Brev)  31.1C  Wrist    *7.7 <4.2 *0.3 >5 Elbow Wrist 5.6 20.5 *37 >50  Elbow    13.3  0.7         Left Ulnar Motor (Abd Dig Min)  32.5C  Wrist    3.0 <4.2 8.1 >3 B Elbow Wrist 3.3 19.0 58 >53  B Elbow    6.3  7.7  A Elbow B Elbow 1.6 10.0 62 >53  A Elbow    7.9  6.4         Right Ulnar  Motor (Abd Dig Min)  31.3C  Wrist    3.1 <4.2 6.8 >3 B Elbow Wrist 3.2 19.0 59 >53  B Elbow    6.3  6.9  A Elbow B Elbow 1.2 10.0 83 >53  A Elbow    7.5  5.4          EMG   Side Muscle Nerve Root Ins Act Fibs Psw Amp Dur Poly Recrt Int Dennie Bible Comment  Left Abd Poll Brev Median C8-T1 *Incr *4+ *4+ Nml Nml 0 *Reduced Nml few distant MUAPS  Left 1stDorInt Ulnar C8-T1 Nml Nml Nml Nml Nml 0 Nml Nml   Left PronatorTeres Median C6-7 Nml Nml Nml Nml Nml 0 Nml Nml   Left Biceps Musculocut C5-6 Nml Nml Nml Nml Nml 0 Nml Nml     Nerve Conduction Studies Anti Sensory Left/Right Comparison   Stim Site L Lat (ms) R Lat (ms) L-R Lat (ms) L Amp (V) R Amp (V) L-R Amp (%) Site1 Site2 L Vel (m/s) R Vel (m/s) L-R Vel (m/s)  Median Acr Palm Anti Sensory (2nd Digit)  32.5C  Wrist *8.1   11.6   Wrist Palm     Palm *6.0   0.6         Radial Anti Sensory (Base 1st Digit)  32.2C  Wrist 2.2 2.6 0.4 30.5 8.5 72.1 Wrist Base 1st Digit     Ulnar Anti Sensory (5th Digit)  32.8C  Wrist 3.7 *3.9 0.2 20.9 24.1 13.3 Wrist 5th Digit 38 *36 2   Motor Left/Right Comparison   Stim Site L Lat (ms) R Lat (ms) L-R Lat (ms) L Amp (mV) R Amp (mV) L-R Amp (%) Site1 Site2 L Vel (m/s) R Vel (m/s) L-R Vel (m/s)  Median Motor (Abd Poll Brev)  32.5C  Wrist 3.8 *7.7 *3.9 *0.8 *0.3 *62.5 Elbow Wrist *13 *37 *24  Elbow 19.7 13.3 6.4 0.2 0.7 71.4       Ulnar Motor (Abd Dig Min)  32.5C  Wrist 3.0 3.1 0.1 8.1 6.8 16.0 B Elbow Wrist 58 59 1  B Elbow 6.3 6.3 0.0 7.7 6.9 10.4 A Elbow B Elbow 62 83 *21  A Elbow 7.9 7.5 0.4 6.4 5.4 15.6          Waveforms:

## 2023-08-30 NOTE — Progress Notes (Signed)
Hannah Huerta - 63 y.o. female MRN [id number]  Date of birth: 1961-02-15  Office Visit Note: Visit Date: 08/28/2023 PCP: Ivonne Andrew, NP Referred by: Fuller Plan, MD  Subjective: Chief Complaint  Patient presents with   Right Hand - Pain   Left Hand - Pain   HPI: Hannah Huerta is a 63 y.o. female who comes in today at the request of Dr. Sheliah Hatch for evaluation and management of chronic, worsening and severe pain, numbness and tingling in the Bilateral upper extremities.  Patient is Right hand dominant.  She ports essentially a year or so of worsening bilateral hand symptoms right more than left but somewhat equally.  She has been treated by Dr. Dimple Casey with presumed carpal tunnel syndrome with diagnostic and therapeutic injections into the carpal tunnels on 2 occasions.  Both times they have help diagnostically but the last time did not help very long.  Her case is complicated by multiple joint arthropathy and pain as well as history of low back problems with spondylolisthesis and now back surgery.  She gets paresthesias into both hands globally somewhat more in the radial digits.  She does get nocturnal complaints.  These are despite the use of gabapentin and anti-inflammatories and bracing and injection.  She does endorse some neck pain but has not had any cervical spine surgery.  No significant imaging of the cervical spine noted.  She does have type 2 diabetes with fairly low hemoglobin A1c numbers.  She has not had prior electrodiagnostic study.  She is getting pain up in the wrist with swelling.  Pain up in the arms as well.  No definitive rheumatoid arthritis.   I spent more than 40 minutes speaking face-to-face with the patient with 50% of the time in counseling and discussing coordination of care.       Review of Systems  Musculoskeletal:  Positive for back pain, joint pain and neck pain.  Neurological:  Positive for tingling and weakness.  All other systems  reviewed and are negative.  Otherwise per HPI.  Assessment & Plan: Visit Diagnoses:    ICD-10-CM   1. Paresthesia of skin  R20.2 NCV with EMG (electromyography)    2. Bilateral hand pain  M79.641    M79.642     3. Bilateral wrist pain  M25.531    M25.532     4. Weakness  R53.1        Plan: Impression: Differential diagnosis is severe median nerve neuropathy at the wrist versus cervical radiculopathy or polyneuropathy.  Given the diagnostic symptomatic relief with carpal tunnel injections this is likely carpal tunnel related.  Electrodiagnostic study performed today.  The above electrodiagnostic study is ABNORMAL and reveals evidence of a severe bilateral median nerve entrapment at the wrist (carpal tunnel syndrome) affecting sensory and motor components. The lesion is characterized by sensory and motor demyelination with evidence of axonal injury.  Interestingly, the normal value for the peak onset of the left median motor study is likely erroneous secondary to abnormal waveform appearance.  There is no significant electrodiagnostic evidence of any other focal nerve entrapment, brachial plexopathy or cervical radiculopathy.   Recommendations: 1.  Follow-up with referring physician. 2.  Continue current management of symptoms. 3.  Suggest surgical evaluation.  Despite appropriate decompressive treatment she will likely have some residual symptoms.  Meds & Orders: No orders of the defined types were placed in this encounter.   Orders Placed This Encounter  Procedures   NCV with  EMG (electromyography)    Follow-up: Return for Sheliah Hatch, MD.   Procedures: No procedures performed  EMG & NCV Findings: Evaluation of the left median motor nerve showed reduced amplitude (0.8 mV) and decreased conduction velocity (Elbow-Wrist, 13 m/s).  The right median motor nerve showed prolonged distal onset latency (7.7 ms), reduced amplitude (0.3 mV), and decreased conduction velocity  (Elbow-Wrist, 37 m/s).  The left median (across palm) sensory nerve showed prolonged distal peak latency (Wrist, 8.1 ms) and prolonged distal peak latency (Palm, 6.0 ms).  The right median (across palm) sensory nerve showed no response (Wrist) and no response (Palm).  The right ulnar sensory nerve showed prolonged distal peak latency (3.9 ms) and decreased conduction velocity (Wrist-5th Digit, 36 m/s).  All remaining nerves (as indicated in the following tables) were within normal limits.  Left vs. Right side comparison data for the median motor nerve indicates abnormal L-R latency difference (3.9 ms), abnormal L-R amplitude difference (62.5 %), and abnormal L-R velocity difference (Elbow-Wrist, 24 m/s).  The ulnar motor nerve indicates abnormal L-R velocity difference (A Elbow-B Elbow, 21 m/s).  All remaining left vs. right side differences were within normal limits.    Needle evaluation of the left abductor pollicis brevis muscle showed increased insertional activity, widespread spontaneous activity, and diminished recruitment.  All remaining muscles (as indicated in the following table) showed no evidence of electrical instability.    Impression: The above electrodiagnostic study is ABNORMAL and reveals evidence of a severe bilateral median nerve entrapment at the wrist (carpal tunnel syndrome) affecting sensory and motor components. The lesion is characterized by sensory and motor demyelination with evidence of axonal injury.  Interestingly, the normal value for the peak onset of the left median motor study is likely erroneous secondary to abnormal waveform appearance.  There is no significant electrodiagnostic evidence of any other focal nerve entrapment, brachial plexopathy or cervical radiculopathy.   Recommendations: 1.  Follow-up with referring physician. 2.  Continue current management of symptoms. 3.  Suggest surgical evaluation.  Despite appropriate decompressive treatment she will likely  have some residual symptoms.  ___________________________ Naaman Plummer FAAPMR Board Certified, American Board of Physical Medicine and Rehabilitation    Nerve Conduction Studies Anti Sensory Summary Table   Stim Site NR Peak (ms) Norm Peak (ms) P-T Amp (V) Norm P-T Amp Site1 Site2 Delta-P (ms) Dist (cm) Vel (m/s) Norm Vel (m/s)  Left Median Acr Palm Anti Sensory (2nd Digit)  32.5C  Wrist    *8.1 <3.6 11.6 >10 Wrist Palm 2.1 0.0    Palm    *6.0 <2.0 0.6         Right Median Acr Palm Anti Sensory (2nd Digit)  31.1C  Wrist *NR  <3.6  >10 Wrist Palm  0.0    Palm *NR  <2.0          Left Radial Anti Sensory (Base 1st Digit)  32.2C  Wrist    2.2 <3.1 30.5  Wrist Base 1st Digit 2.2 0.0    Right Radial Anti Sensory (Base 1st Digit)  30.9C  Wrist    2.6 <3.1 8.5  Wrist Base 1st Digit 2.6 0.0    Left Ulnar Anti Sensory (5th Digit)  32.8C  Wrist    3.7 <3.7 20.9 >15.0 Wrist 5th Digit 3.7 14.0 38 >38  Right Ulnar Anti Sensory (5th Digit)  31.3C  Wrist    *3.9 <3.7 24.1 >15.0 Wrist 5th Digit 3.9 14.0 *36 >38   Motor Summary Table   Stim  Site NR Onset (ms) Norm Onset (ms) O-P Amp (mV) Norm O-P Amp Site1 Site2 Delta-0 (ms) Dist (cm) Vel (m/s) Norm Vel (m/s)  Left Median Motor (Abd Poll Brev)  32.5C  Wrist    3.8 <4.2 *0.8 >5 Elbow Wrist 15.9 20.5 *13 >50  Elbow    19.7  0.2         Right Median Motor (Abd Poll Brev)  31.1C  Wrist    *7.7 <4.2 *0.3 >5 Elbow Wrist 5.6 20.5 *37 >50  Elbow    13.3  0.7         Left Ulnar Motor (Abd Dig Min)  32.5C  Wrist    3.0 <4.2 8.1 >3 B Elbow Wrist 3.3 19.0 58 >53  B Elbow    6.3  7.7  A Elbow B Elbow 1.6 10.0 62 >53  A Elbow    7.9  6.4         Right Ulnar Motor (Abd Dig Min)  31.3C  Wrist    3.1 <4.2 6.8 >3 B Elbow Wrist 3.2 19.0 59 >53  B Elbow    6.3  6.9  A Elbow B Elbow 1.2 10.0 83 >53  A Elbow    7.5  5.4          EMG   Side Muscle Nerve Root Ins Act Fibs Psw Amp Dur Poly Recrt Int Dennie Bible Comment  Left Abd Poll Brev Median C8-T1 *Incr  *4+ *4+ Nml Nml 0 *Reduced Nml few distant MUAPS  Left 1stDorInt Ulnar C8-T1 Nml Nml Nml Nml Nml 0 Nml Nml   Left PronatorTeres Median C6-7 Nml Nml Nml Nml Nml 0 Nml Nml   Left Biceps Musculocut C5-6 Nml Nml Nml Nml Nml 0 Nml Nml     Nerve Conduction Studies Anti Sensory Left/Right Comparison   Stim Site L Lat (ms) R Lat (ms) L-R Lat (ms) L Amp (V) R Amp (V) L-R Amp (%) Site1 Site2 L Vel (m/s) R Vel (m/s) L-R Vel (m/s)  Median Acr Palm Anti Sensory (2nd Digit)  32.5C  Wrist *8.1   11.6   Wrist Palm     Palm *6.0   0.6         Radial Anti Sensory (Base 1st Digit)  32.2C  Wrist 2.2 2.6 0.4 30.5 8.5 72.1 Wrist Base 1st Digit     Ulnar Anti Sensory (5th Digit)  32.8C  Wrist 3.7 *3.9 0.2 20.9 24.1 13.3 Wrist 5th Digit 38 *36 2   Motor Left/Right Comparison   Stim Site L Lat (ms) R Lat (ms) L-R Lat (ms) L Amp (mV) R Amp (mV) L-R Amp (%) Site1 Site2 L Vel (m/s) R Vel (m/s) L-R Vel (m/s)  Median Motor (Abd Poll Brev)  32.5C  Wrist 3.8 *7.7 *3.9 *0.8 *0.3 *62.5 Elbow Wrist *13 *37 *24  Elbow 19.7 13.3 6.4 0.2 0.7 71.4       Ulnar Motor (Abd Dig Min)  32.5C  Wrist 3.0 3.1 0.1 8.1 6.8 16.0 B Elbow Wrist 58 59 1  B Elbow 6.3 6.3 0.0 7.7 6.9 10.4 A Elbow B Elbow 62 83 *21  A Elbow 7.9 7.5 0.4 6.4 5.4 15.6          Waveforms:                     Clinical History: No specialty comments available.   She reports that she has never smoked. She has been exposed to tobacco smoke. She has never  used smokeless tobacco.  Recent Labs    11/03/22 1050 05/04/23 0838 08/17/23 0824  HGBA1C 5.8* 6.3* 6.0*    Objective:  VS:  HT:    WT:   BMI:     BP:   HR: bpm  TEMP: ( )  RESP:  Physical Exam Vitals and nursing note reviewed.  Constitutional:      General: She is not in acute distress.    Appearance: Normal appearance. She is well-developed. She is obese. She is not ill-appearing.  HENT:     Head: Normocephalic and atraumatic.  Eyes:     Conjunctiva/sclera: Conjunctivae  normal.     Pupils: Pupils are equal, round, and reactive to light.  Cardiovascular:     Rate and Rhythm: Normal rate.     Pulses: Normal pulses.  Pulmonary:     Effort: Pulmonary effort is normal.  Musculoskeletal:        General: Tenderness present. No swelling or deformity.     Cervical back: Normal range of motion and neck supple. Tenderness present.     Right lower leg: No edema.     Left lower leg: No edema.     Comments: Inspection reveals no atrophy of the bilateral APB or FDI or hand intrinsics. There is no swelling, color changes, allodynia or dystrophic changes. There is 5 out of 5 strength in the bilateral wrist extension, finger abduction and long finger flexion. There is impaired sensation to light touch in bilateral median nerve distributions. There is a negative Froment's test bilaterally. There is a positive Tinel's test at the bilateral wrists and negative elbow. There is a positive Phalen's test bilaterally. There is a negative Hoffmann's test bilaterally.  Skin:    General: Skin is warm and dry.     Findings: No erythema or rash.  Neurological:     General: No focal deficit present.     Mental Status: She is alert and oriented to person, place, and time.     Cranial Nerves: No cranial nerve deficit.     Sensory: Sensory deficit present.     Motor: Weakness present. No abnormal muscle tone.     Coordination: Coordination normal.     Gait: Gait normal.  Psychiatric:        Mood and Affect: Mood normal.        Behavior: Behavior normal.     Ortho Exam  Imaging: No results found.  Past Medical/Family/Surgical/Social History: Medications & Allergies reviewed per EMR, new medications updated. Patient Active Problem List   Diagnosis Date Noted   Pain in joint of right hip 04/12/2023   Cervical cancer screening 12/21/2022   Type 2 diabetes mellitus without complication, without long-term current use of insulin (HCC) 08/04/2022   Medial orbital wall fracture,  closed, initial encounter (HCC) 10/03/2021   Bilateral carpal tunnel syndrome 09/22/2021   Rheumatoid factor positive 09/22/2021   Bilateral hand swelling 09/22/2021   Spondylolisthesis 05/06/2021   Uncontrolled type 2 diabetes mellitus with microalbuminuria 03/28/2021   Foot-drop 01/04/2021   Status post lumbar spinal fusion 02/12/2020   Spondylolisthesis of lumbar region 02/05/2020   Arthropathy of lumbar facet joint 12/11/2019   Chronic right-sided low back pain with right-sided sciatica 11/04/2019   Other chronic pain 11/04/2019   DDD (degenerative disc disease), lumbar 11/04/2019   Essential hypertension 11/04/2019   Body mass index (BMI) 50.0-59.9, adult (HCC) 11/04/2019   Spinal stenosis of lumbar region 11/04/2019   Spondylolisthesis, lumbar region 11/04/2019   Vitamin D deficiency  09/10/2019   CHF (congestive heart failure) (HCC) 01/30/2018   OSA (obstructive sleep apnea) 03/01/2017   ARF (acute renal failure) (HCC) 02/28/2017   Hyponatremia 02/28/2017   Syncope 07/23/2016   Syncope and collapse 07/23/2016   S/P cardiac catheterization, non obstructive disease 06/04/15 06/05/2015   CAD in native artery 06/05/2015   Fever 06/05/2015   Hypokalemia 06/05/2015   Metabolic syndrome 06/05/2015   Chest pain, neg MI, possible GI 06/04/2015   Colon cancer screening 08/05/2014   Severe obesity (BMI >= 40) (HCC) 08/05/2014   Health care maintenance 05/13/2014   Prediabetes 05/13/2014   Chronic migraine 04/27/2014   Atypical chest pain 04/27/2014   SOB (shortness of breath) 09/16/2013   Palpitations 09/16/2013   HTN (hypertension) 03/09/2012   Hyperlipidemia 03/09/2012   Microcytic anemia    Morbid obesity with BMI of 50.0-59.9, adult (HCC)    Precordial chest pain 03/06/2012   Dizziness 03/06/2012   Past Medical History:  Diagnosis Date   Abnormal Pap smear of cervix    pt couldnt state what type of abnormality   Anemia    Arthritis    CAD in native artery 06/05/2015    Carpal tunnel syndrome, bilateral    Chest pain    a. 02/2012 abnl myoview - inferoapical reverisbility concerning for ischemia, EF 59%;   02/2012 nonobstructive cath   CHF (congestive heart failure) (HCC)    Chronic migraine    Diabetes mellitus without complication (HCC)    GERD (gastroesophageal reflux disease)    HLD (hyperlipidemia)    Hypertension    Hypokalemia 06/05/2015   Metabolic syndrome 06/05/2015   Morbid obesity with BMI of 50.0-59.9, adult (HCC)    Prediabetes    S/P cardiac catheterization, non obstructive disease 06/04/15 06/05/2015   Sickle cell trait (HCC)    Spondylolisthesis of lumbar region    Syncope    Wears glasses    Family History  Problem Relation Age of Onset   Coronary artery disease Father 51   Heart disease Father    Other Father        died in a MVA   Hypertension Father    Sudden death Mother 55       Questionable MI   Diabetes Mother    Hypertension Mother    Hypertension Sister    Pancreatic cancer Maternal Aunt    Breast cancer Maternal Aunt        after age 69   Stroke Paternal Grandmother    Colon polyps Sister    Colon cancer Neg Hx    Gallbladder disease Neg Hx    Esophageal cancer Neg Hx    Past Surgical History:  Procedure Laterality Date   BACK SURGERY     x1 lumbar fusion   CARDIAC CATHETERIZATION N/A 06/04/2015   Procedure: Left Heart Cath and Coronary Angiography;  Surgeon: Lyn Records, MD;  Location: [facility] INVASIVE CV LAB;  Service: Cardiovascular;  Laterality: N/A;   CHOLECYSTECTOMY     laparoscopic   COLONOSCOPY WITH PROPOFOL N/A 09/04/2014   Procedure: COLONOSCOPY WITH PROPOFOL;  Surgeon: Iva Boop, MD;  Location: [facility] ENDOSCOPY;  Service: Endoscopy;  Laterality: N/A;   LEFT HEART CATHETERIZATION WITH CORONARY ANGIOGRAM N/A 03/08/2012   Procedure: LEFT HEART CATHETERIZATION WITH CORONARY ANGIOGRAM;  Surgeon: Herby Abraham, MD;  Location: [facility] CATH LAB;  Service: Cardiovascular;  Laterality: N/A;   SPINE SURGERY      fusion   TUBAL LIGATION     Social History  Occupational History   Occupation: Works in the Engineer, water: [employer]  Tobacco Use   Smoking status: Never    Passive exposure: Past   Smokeless tobacco: Never  Vaping Use   Vaping status: Never Used  Substance and Sexual Activity   Alcohol use: No   Drug use: No   Sexual activity: Not Currently    Birth control/protection: Surgical

## 2023-08-31 ENCOUNTER — Ambulatory Visit (INDEPENDENT_AMBULATORY_CARE_PROVIDER_SITE_OTHER): Payer: 59 | Admitting: Nurse Practitioner

## 2023-08-31 ENCOUNTER — Encounter: Payer: Self-pay | Admitting: Nurse Practitioner

## 2023-08-31 VITALS — BP 120/71 | HR 78 | Temp 98.2°F | Wt 233.8 lb

## 2023-08-31 DIAGNOSIS — I10 Essential (primary) hypertension: Secondary | ICD-10-CM

## 2023-08-31 DIAGNOSIS — J069 Acute upper respiratory infection, unspecified: Secondary | ICD-10-CM | POA: Diagnosis not present

## 2023-08-31 MED ORDER — AZITHROMYCIN 250 MG PO TABS: ORAL_TABLET | ORAL | 0 refills | Status: DC

## 2023-08-31 MED ORDER — AMLODIPINE BESYLATE 5 MG PO TABS: ORAL_TABLET | ORAL | 1 refills | Status: DC

## 2023-08-31 MED ORDER — AMLODIPINE BESYLATE 5 MG PO TABS
ORAL_TABLET | ORAL | 1 refills | Status: DC
Start: 2023-08-31 — End: 2023-08-31

## 2023-08-31 NOTE — Patient Instructions (Signed)
1. Essential hypertension  - amLODipine (NORVASC) 5 MG tablet; TAKE 2 TABLETS(10 MG) BY MOUTH DAILY  Dispense: 180 tablet; Refill: 1  2. Upper respiratory tract infection, unspecified type (Primary)

## 2023-08-31 NOTE — Progress Notes (Signed)
Subjective   Patient ID: Hannah Huerta, female    DOB: October 30, 1960, 63 y.o.   MRN: [id number]  Chief Complaint  Patient presents with   Hospitalization Follow-up    Referring provider: Ivonne Andrew, NP  Hannah Huerta is a 63 y.o. female with Past Medical History: No date: Abnormal Pap smear of cervix     Comment:  pt couldnt state what type of abnormality No date: Anemia No date: Arthritis 06/05/2015: CAD in native artery No date: Carpal tunnel syndrome, bilateral No date: Chest pain     Comment:  a. 02/2012 abnl myoview - inferoapical reverisbility               concerning for ischemia, EF 59%;   02/2012 nonobstructive               cath No date: CHF (congestive heart failure) (HCC) No date: Chronic migraine No date: Diabetes mellitus without complication (HCC) No date: GERD (gastroesophageal reflux disease) No date: HLD (hyperlipidemia) No date: Hypertension 06/05/2015: Hypokalemia 06/05/2015: Metabolic syndrome No date: Morbid obesity with BMI of 50.0-59.9, adult (HCC) No date: Prediabetes 06/05/2015: S/P cardiac catheterization, non obstructive disease 11/ 18/16 No date: Sickle cell trait (HCC) No date: Spondylolisthesis of lumbar region No date: Syncope No date: Wears glasses   HPI  Patient presents today for hospital follow-up.  She was recently seen in the ED for chest pain cardiac workup was negative.  She was also having some wrist and hands.  She has been evaluated since the ED discharge for carpal tunnel syndrome she does have surgery scheduled.  Overall she is doing well and has not had any more chest pain. Denies f/c/s, n/v/d, hemoptysis, PND, leg swelling Denies chest pain or edema     No Known Allergies  Immunization History  Administered Date(s) Administered   Influenza,inj,Quad PF,6+ Mos 04/28/2014, 07/25/2016   Janssen (J&J) SARS-COV-2 Vaccination 11/03/2019   Pneumococcal Polysaccharide-23 04/28/2014, 03/01/2017   Tdap 09/15/2012     Tobacco History: Social History   Tobacco Use  Smoking Status Never   Passive exposure: Past  Smokeless Tobacco Never   Counseling given: Not Answered   Outpatient Encounter Medications as of 08/31/2023  Medication Sig   acetaminophen (TYLENOL) 325 MG tablet Take 2 tablets (650 mg total) by mouth every 6 (six) hours as needed.   aspirin EC 81 MG tablet Take 1 tablet (81 mg total) by mouth daily.   atorvastatin (LIPITOR) 40 MG tablet Take 1 tablet (40 mg total) by mouth daily.   carvedilol (COREG) 12.5 MG tablet TAKE 1 TABLET(12.5 MG) BY MOUTH TWICE DAILY WITH A MEAL   ibuprofen (ADVIL) 600 MG tablet Take 1 tablet (600 mg total) by mouth every 6 (six) hours as needed.   lidocaine (LIDODERM) 5 % Place 1 patch onto the skin daily as needed. Remove & Discard patch within 12 hours or as directed by MD   metFORMIN (GLUCOPHAGE) 500 MG tablet Take 1 tablet (500 mg total) by mouth 2 (two) times daily with a meal.   spironolactone (ALDACTONE) 25 MG tablet Take 1 tablet (25 mg total) by mouth daily.   [DISCONTINUED] amLODipine (NORVASC) 5 MG tablet TAKE 2 TABLETS(10 MG) BY MOUTH DAILY   [DISCONTINUED] azithromycin (ZITHROMAX) 250 MG tablet Take 2 tablets on day 1, then 1 tablet daily on days 2 through 5   amLODipine (NORVASC) 5 MG tablet TAKE 2 TABLETS(10 MG) BY MOUTH DAILY   gabapentin (NEURONTIN) 100 MG capsule TAKE ONE CAPSULE  BY MOUTH AT BEDTIME FOR 180 DAYS, THEN 2 CAPSULES AT BEDTIME (Patient not taking: Reported on 08/31/2023)   naproxen (NAPROSYN) 375 MG tablet Take 1 tablet (375 mg total) by mouth 2 (two) times daily. (Patient not taking: Reported on 08/31/2023)   oxyCODONE (ROXICODONE) 5 MG immediate release tablet Take 1 tablet (5 mg total) by mouth every 4 (four) hours as needed for severe pain. (Patient not taking: Reported on 08/31/2023)   polyethylene glycol (MIRALAX) 17 g packet Take 17 g by mouth daily. (Patient not taking: Reported on 08/31/2023)   Replens Vaginal Moisturizer GEL  Place 1 Application vaginally every 3 (three) days. (Patient not taking: Reported on 08/31/2023)   [DISCONTINUED] amLODipine (NORVASC) 5 MG tablet TAKE 2 TABLETS(10 MG) BY MOUTH DAILY   [DISCONTINUED] amLODipine (NORVASC) 5 MG tablet TAKE 2 TABLETS(10 MG) BY MOUTH DAILY   [DISCONTINUED] azithromycin (ZITHROMAX) 250 MG tablet Take 2 tablets on day 1, then 1 tablet daily on days 2 through 5   No facility-administered encounter medications on file as of 08/31/2023.    Review of Systems  Review of Systems  Constitutional: Negative.   HENT: Negative.    Cardiovascular: Negative.   Gastrointestinal: Negative.   Allergic/Immunologic: Negative.   Neurological: Negative.   Psychiatric/Behavioral: Negative.       Objective:   BP 120/71   Pulse 78   Temp 98.2 F (36.8 C)   Wt 233 lb 12.8 oz (106.1 kg)   LMP 09/15/2013   SpO2 100%   BMI 42.76 kg/m   Wt Readings from Last 5 Encounters:  08/31/23 233 lb 12.8 oz (106.1 kg)  08/19/23 237 lb (107.5 kg)  08/17/23 237 lb (107.5 kg)  05/04/23 232 lb 3.2 oz (105.3 kg)  03/29/23 240 lb (108.9 kg)     Physical Exam Vitals and nursing note reviewed.  Constitutional:      General: She is not in acute distress.    Appearance: She is well-developed.  Cardiovascular:     Rate and Rhythm: Normal rate and regular rhythm.  Pulmonary:     Effort: Pulmonary effort is normal.     Breath sounds: Normal breath sounds.  Neurological:     Mental Status: She is alert and oriented to person, place, and time.       Assessment & Plan:   Upper respiratory tract infection, unspecified type  Essential hypertension -     amLODIPine Besylate; TAKE 2 TABLETS(10 MG) BY MOUTH DAILY  Dispense: 180 tablet; Refill: 1     Return if symptoms worsen or fail to improve.   Ivonne Andrew, NP 08/31/2023

## 2023-09-03 ENCOUNTER — Other Ambulatory Visit: Payer: Self-pay | Admitting: *Deleted

## 2023-09-03 ENCOUNTER — Encounter: Payer: Self-pay | Admitting: Physical Medicine and Rehabilitation

## 2023-09-03 DIAGNOSIS — G5603 Carpal tunnel syndrome, bilateral upper limbs: Secondary | ICD-10-CM

## 2023-09-03 NOTE — Progress Notes (Signed)
 Referral placed.

## 2023-10-01 ENCOUNTER — Encounter: Payer: Self-pay | Admitting: Cardiology

## 2023-10-01 NOTE — Progress Notes (Deleted)
 Cardiology Office Note   Date:  10/01/2023   ID:  MARGIE URBANOWICZ, DOB 01-25-1961, MRN [id number]  PCP:  Ivonne Andrew, NP  Cardiologist:   None Referring:  ***  No chief complaint on file.     History of Present Illness: NASHLEY CORDOBA is a 63 y.o. female who presents for evaluation of chest pain. I saw year years ago for this.   In August 2013, CTA at the time was negative for PE, but Myoview was abnormal showing inferoapical ischemia. She underwent cardiac catheterization on 03/08/2012 which showed EF 55-65%, mild irregularities of LAD, heavily calcified small D2 not suitable for PCI. She was last seen by cardiology service when she presented with recurrent chest pain in March 2015, Myoview obtained on 09/17/2013 showed EF 60% negative for pharmacologic-stress induced ischemia.    ***      *** who presents today for a work in visit. Seen for Dr. Antoine Poche. She has a past medical history of HTN, HLD, prediabetes and nonobstructive CAD.    She was evaluated for chest pain    Presented to ER 06/04/15 with lt sided chest pain, after unloading boxes.  Left-sided substernal pressure with diaphoresis, dizziness, nausea and shortness of breath. She decided to sit down and rest, however it did not immediately provide relief and her symptom eventually went away after 35-40 minutes. She continued working without seeking medical attention. The symptom recurred multiple times both at rest and with exertion.  In ER  EKG was negative for significant ST-T wave changes.  With her symptoms it was decided cardiac cath was needed.  She was hypokalemic and this was treated.   Cath with non obstructive disease.  She had fever prior to admit and the night after cath.  Blood cultures done results pending.  But WBC is normal, u/a normal, CXR normal.  Mild dry cough. She was seen and evaluated by Dr. Elease Hashimoto and felt stable for discharge.  Her meds have been adjusted with BB, statin and aldactone.     Comes in  today. Here with her husband. Says she has fever and is nauseated and dizzy. Does not have PCP. Husband thinks she needs to see Endocrine. Has had fever, chills, thyroid issues, hot flashes, etc. No cardiac complaint reported. Does not have a PCP. She says she is having fevers but does not have a thermometer to check her temperature. She has no energy. Dry cough persists. Blood cultures negative so far. She was previously seen at [facility] and is willing to go back.   Past Medical History:  Diagnosis Date   Abnormal Pap smear of cervix    pt couldnt state what type of abnormality   Anemia    Arthritis    CAD in native artery 06/05/2015   Carpal tunnel syndrome, bilateral    Chest pain    a. 02/2012 abnl myoview - inferoapical reverisbility concerning for ischemia, EF 59%;   02/2012 nonobstructive cath   CHF (congestive heart failure) (HCC)    Chronic migraine    Diabetes mellitus without complication (HCC)    GERD (gastroesophageal reflux disease)    HLD (hyperlipidemia)    Hypertension    Hypokalemia 06/05/2015   Metabolic syndrome 06/05/2015   Morbid obesity with BMI of 50.0-59.9, adult (HCC)    Prediabetes    S/P cardiac catheterization, non obstructive disease 06/04/15 06/05/2015   Sickle cell trait (HCC)    Spondylolisthesis of lumbar region    Syncope  Wears glasses     Past Surgical History:  Procedure Laterality Date   BACK SURGERY     x1 lumbar fusion   CARDIAC CATHETERIZATION N/A 06/04/2015   Procedure: Left Heart Cath and Coronary Angiography;  Surgeon: Lyn Records, MD;  Location: [facility] INVASIVE CV LAB;  Service: Cardiovascular;  Laterality: N/A;   CHOLECYSTECTOMY     laparoscopic   COLONOSCOPY WITH PROPOFOL N/A 09/04/2014   Procedure: COLONOSCOPY WITH PROPOFOL;  Surgeon: Iva Boop, MD;  Location: [facility] ENDOSCOPY;  Service: Endoscopy;  Laterality: N/A;   LEFT HEART CATHETERIZATION WITH CORONARY ANGIOGRAM N/A 03/08/2012   Procedure: LEFT HEART CATHETERIZATION WITH  CORONARY ANGIOGRAM;  Surgeon: Herby Abraham, MD;  Location: [facility] CATH LAB;  Service: Cardiovascular;  Laterality: N/A;   SPINE SURGERY     fusion   TUBAL LIGATION       Current Outpatient Medications  Medication Sig Dispense Refill   acetaminophen (TYLENOL) 325 MG tablet Take 2 tablets (650 mg total) by mouth every 6 (six) hours as needed. 36 tablet 0   amLODipine (NORVASC) 5 MG tablet TAKE 2 TABLETS(10 MG) BY MOUTH DAILY 180 tablet 1   aspirin EC 81 MG tablet Take 1 tablet (81 mg total) by mouth daily. 30 tablet 11   atorvastatin (LIPITOR) 40 MG tablet Take 1 tablet (40 mg total) by mouth daily. 90 tablet 2   carvedilol (COREG) 12.5 MG tablet TAKE 1 TABLET(12.5 MG) BY MOUTH TWICE DAILY WITH A MEAL 180 tablet 0   gabapentin (NEURONTIN) 100 MG capsule TAKE ONE CAPSULE BY MOUTH AT BEDTIME FOR 180 DAYS, THEN 2 CAPSULES AT BEDTIME (Patient not taking: Reported on 08/31/2023) 60 capsule 3   ibuprofen (ADVIL) 600 MG tablet Take 1 tablet (600 mg total) by mouth every 6 (six) hours as needed. 30 tablet 0   lidocaine (LIDODERM) 5 % Place 1 patch onto the skin daily as needed. Remove & Discard patch within 12 hours or as directed by MD 15 patch 0   metFORMIN (GLUCOPHAGE) 500 MG tablet Take 1 tablet (500 mg total) by mouth 2 (two) times daily with a meal. 90 tablet 3   naproxen (NAPROSYN) 375 MG tablet Take 1 tablet (375 mg total) by mouth 2 (two) times daily. (Patient not taking: Reported on 08/31/2023) 20 tablet 0   oxyCODONE (ROXICODONE) 5 MG immediate release tablet Take 1 tablet (5 mg total) by mouth every 4 (four) hours as needed for severe pain. (Patient not taking: Reported on 08/31/2023) 8 tablet 0   polyethylene glycol (MIRALAX) 17 g packet Take 17 g by mouth daily. (Patient not taking: Reported on 08/31/2023) 14 each 0   Replens Vaginal Moisturizer GEL Place 1 Application vaginally every 3 (three) days. (Patient not taking: Reported on 08/31/2023) 6.7 g 2   spironolactone (ALDACTONE) 25 MG tablet  Take 1 tablet (25 mg total) by mouth daily. 90 tablet 3   No current facility-administered medications for this visit.    Allergies:   Patient has no known allergies.    Social History:  The patient  reports that she has never smoked. She has been exposed to tobacco smoke. She has never used smokeless tobacco. She reports that she does not drink alcohol and does not use drugs.   Family History:  The patient's ***family history includes Breast cancer in her maternal aunt; Colon polyps in her sister; Coronary artery disease (age of onset: 40) in her father; Diabetes in her mother; Heart disease in her father; Hypertension in  her father, mother, and sister; Other in her father; Pancreatic cancer in her maternal aunt; Stroke in her paternal grandmother; Sudden death (age of onset: 79) in her mother.    ROS:  Please see the history of present illness.   Otherwise, review of systems are positive for {NONE DEFAULTED:18576}.   All other systems are reviewed and negative.    PHYSICAL EXAM: VS:  LMP 09/15/2013  , BMI There is no height or weight on file to calculate BMI. GENERAL:  Well appearing HEENT:  Pupils equal round and reactive, fundi not visualized, oral mucosa unremarkable NECK:  No jugular venous distention, waveform within normal limits, carotid upstroke brisk and symmetric, no bruits, no thyromegaly LYMPHATICS:  No cervical, inguinal adenopathy LUNGS:  Clear to auscultation bilaterally BACK:  No CVA tenderness CHEST:  Unremarkable HEART:  PMI not displaced or sustained,S1 and S2 within normal limits, no S3, no S4, no clicks, no rubs, *** murmurs ABD:  Flat, positive bowel sounds normal in frequency in pitch, no bruits, no rebound, no guarding, no midline pulsatile mass, no hepatomegaly, no splenomegaly EXT:  2 plus pulses throughout, no edema, no cyanosis no clubbing SKIN:  No rashes no nodules NEURO:  Cranial nerves II through XII grossly intact, motor grossly intact  throughout PSYCH:  Cognitively intact, oriented to person place and time    EKG:        Recent Labs: 08/17/2023: ALT 9 08/19/2023: BUN 13; Creatinine, Ser 1.01; Hemoglobin 10.4; Platelets 263; Potassium 3.4; Sodium 141    Lipid Panel    Component Value Date/Time   CHOL 152 05/04/2023 0838   TRIG 51 05/04/2023 0838   HDL 59 05/04/2023 0838   CHOLHDL 2.6 05/04/2023 0838   CHOLHDL 4.3 03/07/2017 1109   VLDL 25 03/07/2017 1109   LDLCALC 82 05/04/2023 0838      Wt Readings from Last 3 Encounters:  08/31/23 233 lb 12.8 oz (106.1 kg)  08/19/23 237 lb (107.5 kg)  08/17/23 237 lb (107.5 kg)      Other studies Reviewed: Additional studies/ records that were reviewed today include: ***. Review of the above records demonstrates:  Please see elsewhere in the note.  ***   ASSESSMENT AND PLAN:  Chest pain:   ***   - do not suspect this is cardiac. No fever here today. WBC was not elevated. So far, no growth on her blood cultures. Needs to get to primary care. Will try to get her back to [facility] for evaluation. Encouraged her to get a thermometer if she can to monitor for fever.    Recent cardiac cath with non obstructive disease:   ***   would manage medically. Has been started on statin, beta blocker and aldactone. Will still need her follow up lab   HTN :  ***  - BP ok on her current regimen.    HLD:  ***   - now on statin.      Current medicines are reviewed at length with the patient today.  The patient {ACTIONS; HAS/DOES NOT HAVE:19233} concerns regarding medicines.  The following changes have been made:  {PLAN; NO CHANGE:13088:s}  Labs/ tests ordered today include: *** No orders of the defined types were placed in this encounter.    Disposition:   FU with ***    Signed, Rollene Rotunda, MD  10/01/2023 9:34 PM    [facility]

## 2023-10-02 ENCOUNTER — Other Ambulatory Visit: Payer: Self-pay | Admitting: Nurse Practitioner

## 2023-10-02 DIAGNOSIS — I10 Essential (primary) hypertension: Secondary | ICD-10-CM

## 2023-10-03 ENCOUNTER — Ambulatory Visit: Admitting: Cardiology

## 2023-10-03 DIAGNOSIS — I10 Essential (primary) hypertension: Secondary | ICD-10-CM

## 2023-10-03 DIAGNOSIS — E785 Hyperlipidemia, unspecified: Secondary | ICD-10-CM

## 2023-10-03 DIAGNOSIS — R072 Precordial pain: Secondary | ICD-10-CM

## 2023-10-08 ENCOUNTER — Other Ambulatory Visit (INDEPENDENT_AMBULATORY_CARE_PROVIDER_SITE_OTHER)

## 2023-10-08 ENCOUNTER — Ambulatory Visit (INDEPENDENT_AMBULATORY_CARE_PROVIDER_SITE_OTHER): Payer: 59 | Admitting: Orthopedic Surgery

## 2023-10-08 DIAGNOSIS — M25532 Pain in left wrist: Secondary | ICD-10-CM

## 2023-10-08 DIAGNOSIS — M25531 Pain in right wrist: Secondary | ICD-10-CM

## 2023-10-08 DIAGNOSIS — G5603 Carpal tunnel syndrome, bilateral upper limbs: Secondary | ICD-10-CM | POA: Diagnosis not present

## 2023-10-08 NOTE — Progress Notes (Signed)
 Hannah Huerta - 63 y.o. female MRN [id number]  Date of birth: 1961/03/09  Office Visit Note: Visit Date: 10/08/2023 PCP: Ivonne Andrew, NP Referred by: Fuller Plan, MD  Subjective: No chief complaint on file.  HPI: Hannah Huerta is a pleasant 63 y.o. female who presents today for evaluation of bilateral hand numbness and tingling, left greater than right.  She is also describing nocturnal symptoms on a regular basis.  Has trialed bracing critically for nocturnal symptoms without lasting relief, has undergone prior carpal tunnel injections in March of last year as well without lasting relief.  She is diabetic, demonstrates appropriate glycemic control with A1c of 6.0 from January of this year.  She has a history of prior elevated rheumatoid markers as well.  Pertinent ROS were reviewed with the patient and found to be negative unless otherwise specified above in HPI.   Visit Reason:  left> right hand numbness and tingling Duration of symptoms: years Hand dominance: right Occupation: retired Diabetic: Yes Smoking: No Heart/Lung History: none Blood Thinners: baby aspirin  Prior Testing/EMG: EMG 08/28/23 Injections (Date): yes 09/26/22 Treatments: brace, injections Prior Surgery: none  Assessment & Plan: Visit Diagnoses:  1. Bilateral carpal tunnel syndrome   2. Bilateral wrist pain     Plan: Extensive discussion was had with the patient today about her ongoing bilateral carpal tunnel syndrome that is refractory to conservative care.  Patient has both clinical and electrodiagnostic evidence to confirm this diagnosis.  At this juncture, she is indicated for bilateral, staged open carpal tunnel release.  I discussed that given the potential nature of ongoing rheumatoid condition, open carpal tunnel release would be more beneficial for potential synovectomy.  Risks and benefits of both operations were discussed in detail today.  Understanding all risks and benefits, patient  would like to have surgery done in the form of bilateral, staged carpal tunnel release under local.  Risks include but not limited to infection, bleeding, scarring, stiffness, nerve injury or vascular, tendon injury, risk of recurrence and need for subsequent operation were all discussed in detail.  Patient consented understanding the above.  Will move forward surgical scheduling.  She would like to begin with the left side.  Will move forward with surgical scheduling of left open carpal tunnel release under local anesthesia at the next available date per patient preference.   Follow-up: No follow-ups on file.   Meds & Orders: No orders of the defined types were placed in this encounter.   Orders Placed This Encounter  Procedures   XR Wrist Complete Left   XR Wrist Complete Right     Procedures: No procedures performed      Clinical History: No specialty comments available.  She reports that she has never smoked. She has been exposed to tobacco smoke. She has never used smokeless tobacco.  Recent Labs    11/03/22 1050 05/04/23 0838 08/17/23 0824  HGBA1C 5.8* 6.3* 6.0*    Objective:   Vital Signs: LMP 09/15/2013   Physical Exam  Gen: Well-appearing, in no acute distress; non-toxic CV: Regular Rate. Well-perfused. Warm.  Resp: Breathing unlabored on room air; no wheezing. Psych: Fluid speech in conversation; appropriate affect; normal thought process  Ortho Exam PHYSICAL EXAM:  General: Patient is well appearing and in no distress. Cervical spine mobility is full in all directions:  Skin and Muscle: No significant skin changes are apparent to upper extremities.   Range of Motion and Palpation Tests: Mobility is full about the  elbows with flexion and extension.  Forearm supination and pronation are 85/85 bilaterally.  Wrist flexion/extension is 75/65 bilaterally.  Digital flexion and extension are full.  Thumb opposition is full to the base of the small fingers  bilaterally.    No cords or nodules are palpated.  No triggering is observed.     Neurologic, Vascular, Motor: Sensation is diminished to light touch in the bilateral median nerve distribution.    Thenar atrophy: Mild bilaterally Tinel sign: Positive bilateral Carpal tunnel compression: Positive bilateral Phalen test: Positive bilateral  Sensory bilateral hand 2-point discrimination (thumb, index, middle): 7-8 mm bilaterally  Motor bilateral hand APB: 4/5 bilaterally  Fingers pink and well perfused.  Capillary refill is brisk.     Lab Results  Component Value Date   HGBA1C 6.0 (A) 08/17/2023     Imaging: XR Wrist Complete Right Result Date: 10/08/2023 X-rays of the right wrist, multiple views were obtained X-rays demonstrate stable appearance of the radiocarpal and midcarpal articulations without significant abnormalities appreciated.  XR Wrist Complete Left Result Date: 10/08/2023 X-rays of the left wrist, multiple views were obtained X-rays demonstrate significant degenerative changes at the radiocarpal interval with associated scapholunate diastases consistent with progressive SLAC wrist.  Notable joint space narrowing at the radial lunate articulation.   Past Medical/Family/Surgical/Social History: Medications & Allergies reviewed per EMR, new medications updated. Patient Active Problem List   Diagnosis Date Noted   Pain in joint of right hip 04/12/2023   Cervical cancer screening 12/21/2022   Type 2 diabetes mellitus without complication, without long-term current use of insulin (HCC) 08/04/2022   Medial orbital wall fracture, closed, initial encounter (HCC) 10/03/2021   Bilateral carpal tunnel syndrome 09/22/2021   Rheumatoid factor positive 09/22/2021   Bilateral hand swelling 09/22/2021   Spondylolisthesis 05/06/2021   Uncontrolled type 2 diabetes mellitus with microalbuminuria 03/28/2021   Foot-drop 01/04/2021   Status post lumbar spinal fusion 02/12/2020    Spondylolisthesis of lumbar region 02/05/2020   Arthropathy of lumbar facet joint 12/11/2019   Chronic right-sided low back pain with right-sided sciatica 11/04/2019   Other chronic pain 11/04/2019   DDD (degenerative disc disease), lumbar 11/04/2019   Essential hypertension 11/04/2019   Body mass index (BMI) 50.0-59.9, adult (HCC) 11/04/2019   Spinal stenosis of lumbar region 11/04/2019   Spondylolisthesis, lumbar region 11/04/2019   Vitamin D deficiency 09/10/2019   CHF (congestive heart failure) (HCC) 01/30/2018   OSA (obstructive sleep apnea) 03/01/2017   ARF (acute renal failure) (HCC) 02/28/2017   Hyponatremia 02/28/2017   Syncope 07/23/2016   Syncope and collapse 07/23/2016   S/P cardiac catheterization, non obstructive disease 06/04/15 06/05/2015   CAD in native artery 06/05/2015   Fever 06/05/2015   Hypokalemia 06/05/2015   Metabolic syndrome 06/05/2015   Chest pain, neg MI, possible GI 06/04/2015   Colon cancer screening 08/05/2014   Severe obesity (BMI >= 40) (HCC) 08/05/2014   Health care maintenance 05/13/2014   Prediabetes 05/13/2014   Chronic migraine 04/27/2014   Atypical chest pain 04/27/2014   SOB (shortness of breath) 09/16/2013   Palpitations 09/16/2013   HTN (hypertension) 03/09/2012   Hyperlipidemia 03/09/2012   Microcytic anemia    Morbid obesity with BMI of 50.0-59.9, adult (HCC)    Precordial chest pain 03/06/2012   Dizziness 03/06/2012   Past Medical History:  Diagnosis Date   Abnormal Pap smear of cervix    pt couldnt state what type of abnormality   Anemia    Arthritis    Carpal tunnel  syndrome, bilateral    CHF (congestive heart failure) (HCC)    Chronic migraine    Diabetes mellitus without complication (HCC)    GERD (gastroesophageal reflux disease)    HLD (hyperlipidemia)    Hypertension    Hypokalemia 06/05/2015   Metabolic syndrome 06/05/2015   Morbid obesity with BMI of 50.0-59.9, adult (HCC)    Prediabetes    S/P cardiac  catheterization, non obstructive disease 06/04/15 06/05/2015   Sickle cell trait (HCC)    Spondylolisthesis of lumbar region    Syncope    Family History  Problem Relation Age of Onset   Coronary artery disease Father 53   Heart disease Father    Other Father        died in a MVA   Hypertension Father    Sudden death Mother 6       Questionable MI   Diabetes Mother    Hypertension Mother    Hypertension Sister    Pancreatic cancer Maternal Aunt    Breast cancer Maternal Aunt        after age 74   Stroke Paternal Grandmother    Colon polyps Sister    Colon cancer Neg Hx    Gallbladder disease Neg Hx    Esophageal cancer Neg Hx    Past Surgical History:  Procedure Laterality Date   BACK SURGERY     x1 lumbar fusion   CARDIAC CATHETERIZATION N/A 06/04/2015   Procedure: Left Heart Cath and Coronary Angiography;  Surgeon: Lyn Records, MD;  [facility]VAS[facility] CV LAB;  Service: Cardiovascular;  Laterality: N/A;   CHOLECYSTECTOMY     laparoscopic   COLONOSCOPY WITH PROPOFOL N/A 09/04/2014   Procedure: COLONOSCOPY WITH PROPOFOL;  Surgeon: Iva Boop, MD;  Location: [facility] ENDOSCOPY;  Service: Endoscopy;  Laterality: N/A;   LEFT HEART CATHETERIZATION WITH CORONARY ANGIOGRAM N/A 03/08/2012   Procedure: LEFT HEART CATHETERIZATION WITH CORONARY ANGIOGRAM;  Surgeon: Herby Abraham, MD;  Location: [facility] CATH LAB;  Service: Cardiovascular;  Laterality: N/A;   SPINE SURGERY     fusion   TUBAL LIGATION     Social History   Occupational History   Occupation: Works in the Engineer, water: [employer]  Tobacco Use   Smoking status: Never    Passive exposure: Past   Smokeless tobacco: Never  Vaping Use   Vaping status: Never Used  Substance and Sexual Activity   Alcohol use: No   Drug use: No   Sexual activity: Not Currently    Birth control/protection: Surgical    Demarrius Guerrero Fara Boros) Denese Killings, M.D. [facility], Hand Surgery

## 2023-10-11 ENCOUNTER — Other Ambulatory Visit: Payer: Self-pay | Admitting: Orthopedic Surgery

## 2023-10-11 DIAGNOSIS — G5602 Carpal tunnel syndrome, left upper limb: Secondary | ICD-10-CM

## 2023-10-11 MED ORDER — OXYCODONE HCL 5 MG PO TABS: 5.0000 mg | ORAL_TABLET | Freq: Four times a day (QID) | ORAL | 0 refills | Status: DC | PRN

## 2023-10-12 ENCOUNTER — Other Ambulatory Visit: Payer: Self-pay

## 2023-10-12 DIAGNOSIS — I10 Essential (primary) hypertension: Secondary | ICD-10-CM

## 2023-10-12 MED ORDER — SPIRONOLACTONE 25 MG PO TABS
25.0000 mg | ORAL_TABLET | Freq: Every day | ORAL | 3 refills | Status: AC
Start: 2023-10-12 — End: 2024-10-11

## 2023-10-25 ENCOUNTER — Ambulatory Visit (INDEPENDENT_AMBULATORY_CARE_PROVIDER_SITE_OTHER): Admitting: Orthopedic Surgery

## 2023-10-25 DIAGNOSIS — Z9889 Other specified postprocedural states: Secondary | ICD-10-CM

## 2023-10-25 NOTE — Progress Notes (Signed)
   GERRY HEAPHY - 63 y.o. female MRN [id number]  Date of birth: 1960/09/16  Office Visit Note: Visit Date: 10/25/2023 PCP: Ivonne Andrew, NP Referred by: Ivonne Andrew, NP  Subjective:  HPI: LILIAS LORENSEN is a 63 y.o. female who presents today for follow up 2 weeks status post left open carpal tunnel release.  She is doing well overall, is pleased with her progress, numbness and tingling has improved drastically.  Pain is controlled.  Pertinent ROS were reviewed with the patient and found to be negative unless otherwise specified above in HPI.   Assessment & Plan: Visit Diagnoses:  1. S/P carpal tunnel release     Plan: She is doing very well overall, sutures removed today.  Can begin occupational therapy with progression to home exercises program for range of motion and strengthening per protocol.  Follow-up in approximately 4 weeks.  At that juncture, should she demonstrate appropriate improvement, we could likely consider surgical scheduling of the right carpal tunnel release at that time.  She should continue utilizing the right wrist brace and doing home exercise program as instructed.  Follow-up: No follow-ups on file.   Meds & Orders: No orders of the defined types were placed in this encounter.  No orders of the defined types were placed in this encounter.    Procedures: No procedures performed       Objective:   Vital Signs: LMP 09/15/2013   Ortho Exam Left hand: - Well-healing palmar incision, sutures removed, skin edges well-approximated without erythema or drainage - Composite fist without restriction - Sensation intact to light touch in the median nerve distribution - 4+/5 APB mild thenar atrophy    Imaging: No results found.   Sache Sane Trevor Mace, M.D. [facility], Hand Surgery

## 2023-10-30 ENCOUNTER — Ambulatory Visit: Admitting: Orthopedic Surgery

## 2023-10-30 DIAGNOSIS — Z9889 Other specified postprocedural states: Secondary | ICD-10-CM

## 2023-10-30 NOTE — Progress Notes (Signed)
   Hannah Huerta - 63 y.o. female MRN [id number]  Date of birth: Feb 09, 1961  Office Visit Note: Visit Date: 10/30/2023 PCP: Jerrlyn Morel, NP Referred by: Jerrlyn Morel, NP  Subjective:  HPI: Hannah Huerta is a 63 y.o. female who presents today for follow up 3 weeks status post left open carpal tunnel release.  She returns today for wound recheck, is concerned that a small area at the distal portion of the incision has opened with associated drainage.    Pertinent ROS were reviewed with the patient and found to be negative unless otherwise specified above in HPI.   Assessment & Plan: Visit Diagnoses:  No diagnosis found.   Plan: Wound check performed today, no evidence of infection.  There is a small area of gapping of the superficial layer of skin at the distal aspect of the wound, appropriate granulation tissue is present beneath.  She may have had some serosanguineous drainage from this area, there is no evidence of purulence or ongoing drainage on examination today.  Steri-Strips were applied, I did counsel her on the importance of not keeping the wound overly covered or macerated, as it did appear somewhat wet today.  Instructions on wound care were given, she will return in 1 week for a recheck to ensure ongoing appropriate healing.  Follow-up: No follow-ups on file.   Meds & Orders: No orders of the defined types were placed in this encounter.  No orders of the defined types were placed in this encounter.    Procedures: No procedures performed       Objective:   Vital Signs: LMP 09/15/2013   Ortho Exam Left hand: - Well-healing palmar incision, skin edges well-approximated proximally, small area of gapping of the skin layer distally, evidence of granulation tissue beneath, no palpable collection, no expressible drainage - Composite fist without restriction - Sensation intact to light touch in the median nerve distribution - 4+/5 APB mild thenar  atrophy    Imaging: No results found.   Naziya Hegwood Alvia Jointer, M.D. [facility], Hand Surgery

## 2023-11-05 ENCOUNTER — Ambulatory Visit (INDEPENDENT_AMBULATORY_CARE_PROVIDER_SITE_OTHER): Admitting: Orthopedic Surgery

## 2023-11-05 DIAGNOSIS — Z9889 Other specified postprocedural states: Secondary | ICD-10-CM

## 2023-11-05 NOTE — Progress Notes (Signed)
   JESSABELLE MARKIEWICZ - 63 y.o. female MRN [id number]  Date of birth: [birth year]  Office Visit Note: Visit Date: 11/05/2023 PCP: Jerrlyn Morel, NP Referred by: Jerrlyn Morel, NP  Subjective:  HPI: RHIA BLATCHFORD is a 63 y.o. female who presents today for follow up 4 weeks status post left wrist open carpal tunnel release.  She presents today for wound check, demonstrates that drainage has resolved, no evidence of ongoing issue.  Pain is controlled, numbness and tingling has improved significantly.  Pertinent ROS were reviewed with the patient and found to be negative unless otherwise specified above in HPI.   Assessment & Plan: Visit Diagnoses:  1. S/P carpal tunnel release     Plan: Wound check today demonstrate stable appearance of the healing, no evidence of ongoing gapping at the distal aspect of the wound.  No evidence of drainage.  At this juncture, she can continue with activities as tolerated.  Follow-up in approximate 4 weeks.  Follow-up: No follow-ups on file.   Meds & Orders: No orders of the defined types were placed in this encounter.   Orders Placed This Encounter  Procedures   Ambulatory referral to Occupational Therapy     Procedures: No procedures performed       Objective:   Vital Signs: LMP 09/15/2013   Ortho Exam Left hand: - Well-healing palmar incision, skin edges well-approximated, no palpable collection, no expressible drainage - Composite fist without restriction - Sensation intact to light touch in the median nerve distribution - 4+/5 APB mild thenar atrophy  Imaging: No results found.   Irem Stoneham Alvia Jointer, M.D. [facility], Hand Surgery

## 2023-11-08 ENCOUNTER — Other Ambulatory Visit: Payer: Self-pay

## 2023-11-08 ENCOUNTER — Ambulatory Visit: Attending: Orthopedic Surgery | Admitting: Occupational Therapy

## 2023-11-08 DIAGNOSIS — Z9889 Other specified postprocedural states: Secondary | ICD-10-CM | POA: Insufficient documentation

## 2023-11-08 DIAGNOSIS — R278 Other lack of coordination: Secondary | ICD-10-CM | POA: Diagnosis present

## 2023-11-08 DIAGNOSIS — M6281 Muscle weakness (generalized): Secondary | ICD-10-CM | POA: Insufficient documentation

## 2023-11-08 DIAGNOSIS — R29818 Other symptoms and signs involving the nervous system: Secondary | ICD-10-CM | POA: Diagnosis present

## 2023-11-08 DIAGNOSIS — R208 Other disturbances of skin sensation: Secondary | ICD-10-CM | POA: Diagnosis present

## 2023-11-08 NOTE — Therapy (Signed)
 OUTPATIENT OCCUPATIONAL THERAPY ORTHO EVALUATION  Patient Name: Hannah Huerta MRN: [id number] DOB:September 18, 1960, 63 y.o., female Today's Date: 11/08/2023  PCP: Rudine Cos  REFERRING PROVIDER: Merrill Abide, MD   END OF SESSION:  OT End of Session - 11/08/23 7253     Visit Number 1    Number of Visits 13   including eval   Date for OT Re-Evaluation 01/04/24    Authorization Type [employer] Dual 2025, Covered 100%, VL: [place]    OT Start Time 0849    OT Stop Time 0928    OT Time Calculation (min) 39 min    Activity Tolerance Patient tolerated treatment well    Behavior During Therapy WFL for tasks assessed/performed             Past Medical History:  Diagnosis Date   Abnormal Pap smear of cervix    pt couldnt state what type of abnormality   Anemia    Arthritis    Carpal tunnel syndrome, bilateral    CHF (congestive heart failure) (HCC)    Chronic migraine    Diabetes mellitus without complication (HCC)    GERD (gastroesophageal reflux disease)    HLD (hyperlipidemia)    Hypertension    Hypokalemia 06/05/2015   Metabolic syndrome 06/05/2015   Morbid obesity with BMI of 50.0-59.9, adult (HCC)    Prediabetes    S/P cardiac catheterization, non obstructive disease 06/04/15 06/05/2015   Sickle cell trait (HCC)    Spondylolisthesis of lumbar region    Syncope    Past Surgical History:  Procedure Laterality Date   BACK SURGERY     x1 lumbar fusion   CARDIAC CATHETERIZATION N/A 06/04/2015   Procedure: Left Heart Cath and Coronary Angiography;  Surgeon: Arty Binning, MD;  Location: [facility] INVASIVE CV LAB;  Service: Cardiovascular;  Laterality: N/A;   CHOLECYSTECTOMY     laparoscopic   COLONOSCOPY WITH PROPOFOL  N/A 09/04/2014   Procedure: COLONOSCOPY WITH PROPOFOL ;  Surgeon: Kenney Peacemaker, MD;  Location: [facility] ENDOSCOPY;  Service: Endoscopy;  Laterality: N/A;   LEFT HEART CATHETERIZATION WITH CORONARY ANGIOGRAM N/A 03/08/2012   Procedure: LEFT HEART CATHETERIZATION WITH  CORONARY ANGIOGRAM;  Surgeon: Kristopher Pheasant, MD;  Location: [facility] CATH LAB;  Service: Cardiovascular;  Laterality: N/A;   SPINE SURGERY     fusion   TUBAL LIGATION     Patient Active Problem List   Diagnosis Date Noted   Pain in joint of right hip 04/12/2023   Cervical cancer screening 12/21/2022   Type 2 diabetes mellitus without complication, without long-term current use of insulin  (HCC) 08/04/2022   Medial orbital wall fracture, closed, initial encounter (HCC) 10/03/2021   Bilateral carpal tunnel syndrome 09/22/2021   Rheumatoid factor positive 09/22/2021   Bilateral hand swelling 09/22/2021   Spondylolisthesis 05/06/2021   Uncontrolled type 2 diabetes mellitus with microalbuminuria 03/28/2021   Foot-drop 01/04/2021   Status post lumbar spinal fusion 02/12/2020   Spondylolisthesis of lumbar region 02/05/2020   Arthropathy of lumbar facet joint 12/11/2019   Chronic right-sided low back pain with right-sided sciatica 11/04/2019   Other chronic pain 11/04/2019   DDD (degenerative disc disease), lumbar 11/04/2019   Essential hypertension 11/04/2019   Body mass index (BMI) 50.0-59.9, adult (HCC) 11/04/2019   Spinal stenosis of lumbar region 11/04/2019   Spondylolisthesis, lumbar region 11/04/2019   Vitamin D  deficiency 09/10/2019   CHF (congestive heart failure) (HCC) 01/30/2018   OSA (obstructive sleep apnea) 03/01/2017   ARF (acute renal failure) (HCC)  02/28/2017   Hyponatremia 02/28/2017   Syncope 07/23/2016   Syncope and collapse 07/23/2016   S/P cardiac catheterization, non obstructive disease 06/04/15 06/05/2015   CAD in native artery 06/05/2015   Fever 06/05/2015   Hypokalemia 06/05/2015   Metabolic syndrome 06/05/2015   Chest pain, neg MI, possible GI 06/04/2015   Colon cancer screening 08/05/2014   Severe obesity (BMI >= 40) (HCC) 08/05/2014   Health care maintenance 05/13/2014   Prediabetes 05/13/2014   Chronic migraine 04/27/2014   Atypical chest pain 04/27/2014    SOB (shortness of breath) 09/16/2013   Palpitations 09/16/2013   HTN (hypertension) 03/09/2012   Hyperlipidemia 03/09/2012   Microcytic anemia    Morbid obesity with BMI of 50.0-59.9, adult (HCC)    Precordial chest pain 03/06/2012   Dizziness 03/06/2012    ONSET DATE: 11/05/2023 (referral date), s/p L carpal tunnel release 10/08/23  REFERRING DIAG: Z98.890 (ICD-10-CM) - S/P carpal tunnel release   THERAPY DIAG:  Other symptoms and signs involving the nervous system  Muscle weakness (generalized)  Other disturbances of skin sensation  Rationale for Evaluation and Treatment: Rehabilitation  SUBJECTIVE:   SUBJECTIVE STATEMENT: L carpal tunnel release surgery. Pt not wearing brace on either hand. Pt reported that doctor provided update that L hand can get wet now, "pat don't rub." Pt reported feeling nervous about doing something wrong and having setback. Pt reported trying to do things with R hand and L hand as able but avoiding area where surgery was done. Pt drove here today and switches hands while driving as needed.  Pt has not had therapy to address B hands before. Pt reported upcoming surgery for R carpal tunnel release, not yet scheduled.   Pt accompanied by: self  PERTINENT HISTORY: s/p Lt carpal tunnel release, B carpal tunnel, s/p PLIF L2-3 and L3-4 7/22 due to chronic spinal stenosis with radiculopathy and neurogenic claudication. PMH includes L4-5 decompression and fusion, CAD, CHF, DM II, HLD, HTN, morbid obesity   Per 11/05/23 Referral Noes: Scar massage; s/p carpal tunnel release   PRECAUTIONS: Per [place]  Hand Protocol 5th Edition, Per 11/05/23 MD Progress Notes: "At this juncture, she can continue with activities as tolerated."   RED FLAGS: None   WEIGHT BEARING RESTRICTIONS: No  PAIN:  Are you having pain? No  FALLS: Has patient fallen in last 6 months? Yes. Number of falls : No  LIVING ENVIRONMENT: Lives with: lives alone Lives in:  House/apartment Stairs: Yes: External: 1 steps; grab bar Has following equipment at home: Single point cane, Walker - 2 wheeled, and shower chair  PLOF: Independent with basic ADLs, IADLs, on disability  PATIENT GOALS: "I would like to be able to get the numbness to go away, to move it and do what I did before the carpal tunnel happened."  NEXT MD VISIT: Nov 27, 2023  OBJECTIVE:  Note: Objective measures were completed at Evaluation unless otherwise noted.  HAND DOMINANCE: Right  ADLs: Eating: ind Grooming: ind, difficulty with hair styling "but getting better" Upper body dressing: ind, difficulty with buttons Lower body dressing: ind, a little difficulty with donning pants d/t decreased pinch strength of LUE Bathing: currently sponge bathing to protect surgical site though plans to start soon with standard shower though noted "everything takes longer"   Playing tambourine in church - difficulty, has not attempted since the surgery  Laundry - family assists  Dishwashing - slower though ind  Handwriting - difficulty d/t numbness of RUE  FUNCTIONAL OUTCOME MEASURES: Quick Dash: 47.7%  deficit    UPPER EXTREMITY ROM:     Active ROM Right eval Left eval  Shoulder flexion [facility]  Shoulder abduction    Shoulder adduction    Shoulder extension    Shoulder internal rotation    Shoulder external rotation    Elbow flexion    Elbow extension    Wrist flexion    Wrist extension    Wrist ulnar deviation    Wrist radial deviation    Wrist pronation    Wrist supination    (Blank rows = not tested)  Active ROM Right eval Left eval  Thumb MCP (0-60)    Thumb IP (0-80)    Thumb Radial abd/add (0-55)     Thumb Palmar abd/add (0-45)     Thumb Opposition to Small Finger     Index MCP (0-90)     Index PIP (0-100)     Index DIP (0-70)      Long MCP (0-90)      Long PIP (0-100)      Long DIP (0-70)      Ring MCP (0-90)      Ring PIP (0-100)      Ring DIP (0-70)       Little MCP (0-90)      Little PIP (0-100)      Little DIP (0-70)      (Blank rows = not tested)  LUE AROM digit flex/ext WFL though pt unable to make tight grasp.  LUE AROM thumb flex slightly impaired compared to RUE. RUE AROM digit flex/ext WFL.   HAND FUNCTION: Grip strength: Right: 53.5, 51.8, 48.9 (51.4 lbs average) lbs; Left: 15.4 lbs  COORDINATION: 9 Hole Peg test: Right: 25 sec; Left: 21 sec  Pt reported RUE feels "weak" likely secondary to CTS symptoms.  SENSATION: LUE: mild symptoms of tingling at tip of thumb  RUE: moderate symptoms of tingling/numbness "whole hand"  Pt has splint for both hands but mainly only wears splint on R hand at night since surgery was completed on LUE.   EDEMA: mild edema of LUE  COGNITION: Overall cognitive status: Within functional limits for tasks assessed  OBSERVATIONS: Pt demo'd unsteady gait and wall-surfing strategy to maintain balance using single-tip cane with broken rubber cane tip. Pt reported feeling unsteady d/t cane is "slippery" on tile surface. OT provided pt with cane from clinic and pt demo'd improved steadiness of gait. Pt wore AFO on R foot.  OT assessed LUE scar site: Noted loose, dead skin at B edges of scar peeling back though scar ultimately healed well, new pink skin underneath dead skin, no s/s of infection. OT noted tightness of LUE scar. ?Presence of stitch   TREATMENT DATE:                                                                                                                             OT educated pt on strategies to reduce fall risk and  recommended to pt to obtain new A/E to prevent falls d/t pt presenting to OT session with a broken cane and unsteady gait. Pt verbalized understanding and stated intent to purchase new cane later today.  OT educated pt on OT role, POC, UE anatomy, dx, prognosis, hand hygiene, and wound care e.g. avoiding peeling dead skin and avoiding applying lotion for now. Pt  verbalized understanding.  OT educated pt on Scar massage. Handout provided, see pt instructions. Pt returned demo with fading v/c and therapist modeling.   PATIENT EDUCATION: Education details: see today's tx above Person educated: Patient Education method: Explanation Education comprehension: verbalized understanding  HOME EXERCISE PROGRAM: 11/08/23 - scar massage (see pt instructions)  GOALS: Goals reviewed with patient? Yes  SHORT TERM GOALS: Target date: 12/07/23  Pt will be ind with HEP using visual handouts. Baseline: new to outpt OT Goal status: INITIAL  2.  Pt will ind recall at least 3 joint protection strategies. Baseline: new to outpt OT Goal status: INITIAL  3.  Pt will return demo of scar massage and edema management techniques. Baseline: OT educated pt on scar massage, pt returned demo.  Goal status: INITIAL  4.  Pt will ind recall at least 3 sensory safety precautions Baseline: pt reported numbness/tingling symptoms of BUE, RUE > LUE Goal status: INITIAL  LONG TERM GOALS: Target date: 01/04/24  Pt will report improved ability for "tight" grasp of LUE as needed for ADL/IADLs. Baseline: Pt reported difficulty with "tight" grasp of LUE. Goal status: INITIAL  2.  Patient will demonstrate at least 30 lbs LUE grip strength as needed to open jars and other containers.  Baseline: Grip strength: Right: 53.5, 51.8, 48.9 (51.4 lbs average) lbs; Left: 15.4 lbs Goal status: INITIAL  3.  Patient will demonstrate at least 16% improvement with quick Dash score (reporting 31.7% disability or less) indicating improved functional use of affected extremity.  Baseline: Quick Dash: 47.7% deficit Goal status: INITIAL  4.  Pt will demo and/or verbalize understanding of A/E options and adaptive strategies as needed for ADL/IADLs. Baseline: pt reported difficulty with some ADL/IADL tasks, see objective measures above Goal status: INITIAL  ASSESSMENT:  CLINICAL  IMPRESSION: Patient is a 63 y.o. female who was seen today for occupational therapy evaluation for s/p L carpal tunnel release 10/08/23. Pt reported improved symptoms of numbness/tingling following L carpal tunnel release and demo'd improved LUE FM coordination of LUE compared to RUE. Pt demo'ing decreased LUE grip strength and decreased participation in ADL/IADLs secondary to recent LUE carpal tunnel release surgery.   Hx includes s/p Lt carpal tunnel release, B carpal tunnel, s/p PLIF L2-3 and L3-4 7/22 due to chronic spinal stenosis with radiculopathy and neurogenic claudication, L4-5 decompression and fusion, CAD, CHF, DM II, HLD, HTN, morbid obesity . Patient currently presents below baseline level of functioning demonstrating functional deficits and impairments as noted below. Pt would benefit from skilled OT services in the outpatient setting to work on impairments as noted.  PERFORMANCE DEFICITS: in functional skills including ADLs, IADLs, coordination, dexterity, proprioception, sensation, edema, ROM, strength, flexibility, Fine motor control, Gross motor control, mobility, balance, body mechanics, endurance, and UE functional use, cognitive skills including energy/drive, and psychosocial skills including environmental adaptation.   IMPAIRMENTS: are limiting patient from ADLs, IADLs, rest and sleep, leisure, and social participation.   COMORBIDITIES: may have co-morbidities  that affects occupational performance. Patient will benefit from skilled OT to address above impairments and improve overall function.  MODIFICATION OR ASSISTANCE TO COMPLETE EVALUATION:  Min-Moderate modification of tasks or assist with assess necessary to complete an evaluation.  OT OCCUPATIONAL PROFILE AND HISTORY: Detailed assessment: Review of records and additional review of physical, cognitive, psychosocial history related to current functional performance.  CLINICAL DECISION MAKING: Moderate - several treatment  options, min-mod task modification necessary  REHAB POTENTIAL: Good  EVALUATION COMPLEXITY: Moderate      PLAN:  OT FREQUENCY: 1x-2x/week (anticipate 2x per week initially, may decrease to 1x per week PRN)  OT DURATION: 6 weeks  PLANNED INTERVENTIONS: 97168 OT Re-evaluation, 97535 self care/ADL training, 16109 therapeutic exercise, 97530 therapeutic activity, 97112 neuromuscular re-education, 97140 manual therapy, 97035 ultrasound, 97018 paraffin, 60454 fluidotherapy, 97010 moist heat, 97010 cryotherapy, 97760 Orthotic Initial, 97761 Prosthetic Initial, 97763 Orthotic/Prosthetic subsequent, passive range of motion, functional mobility training, energy conservation, patient/family education, and DME and/or AE instructions  RECOMMENDED OTHER SERVICES: N/A  CONSULTED AND AGREED WITH PLAN OF CARE: Patient  PLAN FOR NEXT SESSION:  Per [place]  Hand Protocol 5th Edition  HEP: tendon glides, per [place]  Hand Protocol 5th Edition handouts Scar massage Assess scar site Joint protection handout A/E and adaptive strategies Sensory safety precautions  Oakley Bellman, OT 11/08/2023, 9:55 AM

## 2023-11-13 ENCOUNTER — Ambulatory Visit: Admitting: Occupational Therapy

## 2023-11-13 DIAGNOSIS — R29818 Other symptoms and signs involving the nervous system: Secondary | ICD-10-CM | POA: Diagnosis not present

## 2023-11-13 DIAGNOSIS — M6281 Muscle weakness (generalized): Secondary | ICD-10-CM

## 2023-11-13 DIAGNOSIS — R208 Other disturbances of skin sensation: Secondary | ICD-10-CM

## 2023-11-13 DIAGNOSIS — R278 Other lack of coordination: Secondary | ICD-10-CM

## 2023-11-13 NOTE — Therapy (Unsigned)
 OUTPATIENT OCCUPATIONAL THERAPY ORTHO TREATMENT  Patient Name: Hannah Huerta MRN: [id number] DOB:Feb 21, 1961, 63 y.o., female Today's Date: 11/13/2023  PCP: Rudine Cos  REFERRING PROVIDER: Merrill Abide, MD   END OF SESSION:  OT End of Session - 11/13/23 0845     Visit Number 2    Number of Visits 13   including eval   Date for OT Re-Evaluation 01/04/24    Authorization Type U[employer] Dual 2025, Covered 100%, VL: [place]    OT Start Time 0845    OT Stop Time 0930    OT Time Calculation (min) 45 min    Activity Tolerance Patient tolerated treatment well    Behavior During Therapy WFL for tasks assessed/performed             Past Medical History:  Diagnosis Date   Abnormal Pap smear of cervix    pt couldnt state what type of abnormality   Anemia    Arthritis    Carpal tunnel syndrome, bilateral    CHF (congestive heart failure) (HCC)    Chronic migraine    Diabetes mellitus without complication (HCC)    GERD (gastroesophageal reflux disease)    HLD (hyperlipidemia)    Hypertension    Hypokalemia 06/05/2015   Metabolic syndrome 06/05/2015   Morbid obesity with BMI of 50.0-59.9, adult (HCC)    Prediabetes    S/P cardiac catheterization, non obstructive disease 06/04/15 06/05/2015   Sickle cell trait (HCC)    Spondylolisthesis of lumbar region    Syncope    Past Surgical History:  Procedure Laterality Date   BACK SURGERY     x1 lumbar fusion   CARDIAC CATHETERIZATION N/A 06/04/2015   Procedure: Left Heart Cath and Coronary Angiography;  Surgeon: Arty Binning, MD;  Location: [facility] INVASIVE CV LAB;  Service: Cardiovascular;  Laterality: N/A;   CHOLECYSTECTOMY     laparoscopic   COLONOSCOPY WITH PROPOFOL  N/A 09/04/2014   Procedure: COLONOSCOPY WITH PROPOFOL ;  Surgeon: Kenney Peacemaker, MD;  Location: [facility] ENDOSCOPY;  Service: Endoscopy;  Laterality: N/A;   LEFT HEART CATHETERIZATION WITH CORONARY ANGIOGRAM N/A 03/08/2012   Procedure: LEFT HEART CATHETERIZATION WITH  CORONARY ANGIOGRAM;  Surgeon: Kristopher Pheasant, MD;  Location: [facility] CATH LAB;  Service: Cardiovascular;  Laterality: N/A;   SPINE SURGERY     fusion   TUBAL LIGATION     Patient Active Problem List   Diagnosis Date Noted   Pain in joint of right hip 04/12/2023   Cervical cancer screening 12/21/2022   Type 2 diabetes mellitus without complication, without long-term current use of insulin  (HCC) 08/04/2022   Medial orbital wall fracture, closed, initial encounter (HCC) 10/03/2021   Bilateral carpal tunnel syndrome 09/22/2021   Rheumatoid factor positive 09/22/2021   Bilateral hand swelling 09/22/2021   Spondylolisthesis 05/06/2021   Uncontrolled type 2 diabetes mellitus with microalbuminuria 03/28/2021   Foot-drop 01/04/2021   Status post lumbar spinal fusion 02/12/2020   Spondylolisthesis of lumbar region 02/05/2020   Arthropathy of lumbar facet joint 12/11/2019   Chronic right-sided low back pain with right-sided sciatica 11/04/2019   Other chronic pain 11/04/2019   DDD (degenerative disc disease), lumbar 11/04/2019   Essential hypertension 11/04/2019   Body mass index (BMI) 50.0-59.9, adult (HCC) 11/04/2019   Spinal stenosis of lumbar region 11/04/2019   Spondylolisthesis, lumbar region 11/04/2019   Vitamin D  deficiency 09/10/2019   CHF (congestive heart failure) (HCC) 01/30/2018   OSA (obstructive sleep apnea) 03/01/2017   ARF (acute renal failure) (HCC)  02/28/2017   Hyponatremia 02/28/2017   Syncope 07/23/2016   Syncope and collapse 07/23/2016   S/P cardiac catheterization, non obstructive disease 06/04/15 06/05/2015   CAD in native artery 06/05/2015   Fever 06/05/2015   Hypokalemia 06/05/2015   Metabolic syndrome 06/05/2015   Chest pain, neg MI, possible GI 06/04/2015   Colon cancer screening 08/05/2014   Severe obesity (BMI >= 40) (HCC) 08/05/2014   Health care maintenance 05/13/2014   Prediabetes 05/13/2014   Chronic migraine 04/27/2014   Atypical chest pain 04/27/2014    SOB (shortness of breath) 09/16/2013   Palpitations 09/16/2013   HTN (hypertension) 03/09/2012   Hyperlipidemia 03/09/2012   Microcytic anemia    Morbid obesity with BMI of 50.0-59.9, adult (HCC)    Precordial chest pain 03/06/2012   Dizziness 03/06/2012    ONSET DATE: 11/05/2023 (referral date), s/p L carpal tunnel release 10/08/23  REFERRING DIAG: Z98.890 (ICD-10-CM) - S/P carpal tunnel release   THERAPY DIAG:  Muscle weakness (generalized)  Other lack of coordination  Other disturbances of skin sensation  Other symptoms and signs involving the nervous system  Rationale for Evaluation and Treatment: Rehabilitation  SUBJECTIVE:   SUBJECTIVE STATEMENT: Pt reprots feeling pretty good today.  The scar is still healing but has a lot of dry skin around the edges that can get caught on clothing etc  Pt reports MD follow-up to determine timing for surgery for R carpal tunnel release after L hand heals.   Pt replaced the tip on her cane since prior OT visit/eval.  Pt accompanied by: self  PERTINENT HISTORY: s/p Lt carpal tunnel release, B carpal tunnel, s/p PLIF L2-3 and L3-4 7/22 due to chronic spinal stenosis with radiculopathy and neurogenic claudication. PMH includes L4-5 decompression and fusion, CAD, CHF, DM II, HLD, HTN, morbid obesity   Per 11/05/23 Referral Noes: Scar massage; s/p carpal tunnel release   PRECAUTIONS: Per [place]  Hand Protocol 5th Edition, Per 11/05/23 MD Progress Notes: "At this juncture, she can continue with activities as tolerated."   RED FLAGS: None   WEIGHT BEARING RESTRICTIONS: No  PAIN:  Are you having pain? No Pain here or there - but goes away and mostly in wrist  FALLS: Has patient fallen in last 6 months? Yes. Number of falls : No  LIVING ENVIRONMENT: Lives with: lives alone Lives in: House/apartment Stairs: Yes: External: 1 steps; grab bar Has following equipment at home: Single point cane, Walker - 2 wheeled, and shower  chair  PLOF: Independent with basic ADLs, IADLs, on disability  PATIENT GOALS: "I would like to be able to get the numbness to go away, to move it and do what I did before the carpal tunnel happened."  NEXT MD VISIT: Nov 27, 2023  OBJECTIVE:  Note: Objective measures were completed at Evaluation unless otherwise noted.  HAND DOMINANCE: Right  ADLs: Eating: ind Grooming: ind, difficulty with hair styling "but getting better" Upper body dressing: ind, difficulty with buttons Lower body dressing: ind, a little difficulty with donning pants d/t decreased pinch strength of LUE Bathing: currently sponge bathing to protect surgical site though plans to start soon with standard shower though noted "everything takes longer"   Playing tambourine in church - difficulty, has not attempted since the surgery  Laundry - family assists  Dishwashing - slower though ind  Handwriting - difficulty d/t numbness of RUE  FUNCTIONAL OUTCOME MEASURES: Quick Dash: 47.7% deficit    UPPER EXTREMITY ROM:     Active ROM Right eval Left eval  Shoulder flexion [facility]  Shoulder abduction    Shoulder adduction    Shoulder extension    Shoulder internal rotation    Shoulder external rotation    Elbow flexion    Elbow extension    Wrist flexion    Wrist extension    Wrist ulnar deviation    Wrist radial deviation    Wrist pronation    Wrist supination    (Blank rows = not tested)  Active ROM Right eval Left eval  Thumb MCP (0-60)    Thumb IP (0-80)    Thumb Radial abd/add (0-55)     Thumb Palmar abd/add (0-45)     Thumb Opposition to Small Finger     Index MCP (0-90)     Index PIP (0-100)     Index DIP (0-70)      Long MCP (0-90)      Long PIP (0-100)      Long DIP (0-70)      Ring MCP (0-90)      Ring PIP (0-100)      Ring DIP (0-70)      Little MCP (0-90)      Little PIP (0-100)      Little DIP (0-70)      (Blank rows = not tested)  LUE AROM digit flex/ext WFL though pt  unable to make tight grasp.  LUE AROM thumb flex slightly impaired compared to RUE. RUE AROM digit flex/ext WFL.   HAND FUNCTION: Grip strength: Right: 53.5, 51.8, 48.9 (51.4 lbs average) lbs; Left: 15.4 lbs  COORDINATION: 9 Hole Peg test: Right: 25 sec; Left: 21 sec  Pt reported RUE feels "weak" likely secondary to CTS symptoms.  SENSATION: LUE: mild symptoms of tingling at tip of thumb  RUE: moderate symptoms of tingling/numbness "whole hand"  Pt has splint for both hands but mainly only wears splint on R hand at night since surgery was completed on LUE.   EDEMA: mild edema of LUE  COGNITION: Overall cognitive status: Within functional limits for tasks assessed  OBSERVATIONS: Pt demo'd unsteady gait and wall-surfing strategy to maintain balance using single-tip cane with broken rubber cane tip. Pt reported feeling unsteady d/t cane is "slippery" on tile surface. OT provided pt with cane from clinic and pt demo'd improved steadiness of gait. Pt wore AFO on R foot.  OT assessed LUE scar site: Noted loose, dead skin at B edges of scar peeling back though scar ultimately healed well, new pink skin underneath dead skin, no s/s of infection. OT noted tightness of LUE scar. ?Presence of stitch   TODAY'S TREATMENT:                                                                                                                             Self Care: OT reiterated education on Scar massage per previous handout provided.  OT educated patient on sleep positioning as noted in patient instructions to reduce stress to  upper extremity nerves, which could be attributing to reported paresthesias and pain in affected extremity. Patient verbalized understanding. Handout provided.  Pt also encouraged to try her hand/wrist splints at night to avoid fetal positioning with wrists flexed as she continues to describe AM symptoms.  Pt has some edema and is therefore encouraged to work on retrograde  massage to decrease edema of digits for increased tissue extensibility and therefore increased ROM.  Pt shown how to perform task prior ROM activities. Instructions included:   1) Elevate the hand or prop it on a pillow. 2) Start at the fingertips and move towards the base of the hand, then onto the wrist and forearm. 3) Use a light, gentle pressure, as excessive pressure can compress lymphatic vessels and hinder the effectiveness of the massage. 4) Always massage in a downward direction, towards the heart, to help drain the swelling. 5) Repeat the massage several times a day ie) before ROM exercises.  Therapeutic Exercises:  Pt issued tendon gliding exercises/handout with review of motions to isolate DIP, PIP and MCP joints for straight finger position, hook (DIP/PIP flexion), fist (DIP/PIP/MCP flexion), taco/duck (MCP flexion only) and flat fist (MCP and PIP flexion).  Patient benefited from extra time, verbal/tactile cues, and modeling of task to allow time for processing of verbal instructions and improve motor planning of unfamiliar movements. Patient encouraged to perform motions with BUEs 3-5 times/day 5-10 repetitions to help decreased edema, increase ROM and thereby improve grip position and strength.  Therapeutic Activities:  Initiated Putty Exercises with pink putty to begin strengthening, coordination and sensory stimulation of B UEs.  Patient provided visual demonstration, verbal and tactile cues as needed to improve performance of the various exercises/activities.  Pt shown how to twist putty to soften it and even cut putty in half for some of the activities to increased ease with squeezing putty etc.  Pt is encouraged to limit putty activities to 10-15 minutes initially.   Initial exercises initiated due to time limitations in this session but complete handout provided with review of the following:   - Putty Squeezes - cues to squeeze putty into log for use with other exercises and to fold  putty in half with 1 hand  - Putty Rolls - encourage to roll putty into logs with sensory stimulation to entire length of hand, fingers and wrist as needed   - Pinch and Pull with Putty - this motion is combined with different pinches (3-Point Pinch, Tip Pinch, Key Pinch) - patient encouraged to combine tripod, pincer and/or key pinch with "pinch and pull" motion of putty pulling away from midline, changing between different pinches and changing different directions to change grip  OT educated patient on theraputty recommendations: avoid hot environments, place in designated container, avoid contact with fabrics. Patient verbalized understanding.    Patient benefited from extra time, verbal/tactile cues, and modeling of task to allow time for processing of verbal instructions and improve motor planning of unfamiliar movements.  PATIENT EDUCATION: Education details: see today's tx above Person educated: Patient Education method: Explanation, Demonstration, Tactile cues, Verbal cues, and Handouts Education comprehension: verbalized understanding, returned demonstration, verbal cues required, tactile cues required, and needs further education  HOME EXERCISE PROGRAM: 11/08/23 - scar massage (see pt instructions) 11/14/23 - Sleep positions, Tendon Glides and Intro to putty exercises  GOALS: Goals reviewed with patient? Yes  SHORT TERM GOALS: Target date: 12/07/23  Pt will be ind with HEP using visual handouts. Baseline: new to outpt OT Goal status:  IN Progress  2.  Pt will ind recall at least 3 joint protection strategies. Baseline: new to outpt OT Goal status: IN Progress  3.  Pt will return demo of scar massage and edema management techniques. Baseline: OT educated pt on scar massage, pt returned demo.  Goal status: IN Progress  4.  Pt will ind recall at least 3 sensory safety precautions Baseline: pt reported numbness/tingling symptoms of BUE, RUE > LUE Goal status: IN  Progress  LONG TERM GOALS: Target date: 01/04/24  Pt will report improved ability for "tight" grasp of LUE as needed for ADL/IADLs. Baseline: Pt reported difficulty with "tight" grasp of LUE. Goal status: IN Progress  2.  Patient will demonstrate at least 30 lbs LUE grip strength as needed to open jars and other containers.  Baseline: Grip strength: Right: 53.5, 51.8, 48.9 (51.4 lbs average) lbs; Left: 15.4 lbs Goal status: IN Progress  3.  Patient will demonstrate at least 16% improvement with quick Dash score (reporting 31.7% disability or less) indicating improved functional use of affected extremity.  Baseline: Quick Dash: 47.7% deficit Goal status: INITIAL  4.  Pt will demo and/or verbalize understanding of A/E options and adaptive strategies as needed for ADL/IADLs. Baseline: pt reported difficulty with some ADL/IADL tasks, see objective measures above Goal status: INITIAL  ASSESSMENT:  CLINICAL IMPRESSION: Patient is a 63 y.o. female who was seen today for occupational therapy treatment for s/p L carpal tunnel release 10/08/23. Pt reported improved symptoms of numbness/tingling following L carpal tunnel release but with significantly decreased LUE grip strength Patient responded well to initial education and guidance for protecting B wrists, ROM and strengthening ideas. Pt will benefit from continued skilled OT services in the outpatient setting to work on impairments as noted at evaluation to help pt return to [facility] as able.    PERFORMANCE DEFICITS: in functional skills including ADLs, IADLs, coordination, dexterity, proprioception, sensation, edema, ROM, strength, flexibility, Fine motor control, Gross motor control, mobility, balance, body mechanics, endurance, and UE functional use, cognitive skills including energy/drive, and psychosocial skills including environmental adaptation.   IMPAIRMENTS: are limiting patient from ADLs, IADLs, rest and sleep, leisure, and social  participation.   COMORBIDITIES: may have co-morbidities  that affects occupational performance. Patient will benefit from skilled OT to address above impairments and improve overall function.  REHAB POTENTIAL: Good  PLAN:  OT FREQUENCY: 1x-2x/week (anticipate 2x per week initially, may decrease to 1x per week PRN)  OT DURATION: 6 weeks  PLANNED INTERVENTIONS: 97168 OT Re-evaluation, 97535 self care/ADL training, 35009 therapeutic exercise, 97530 therapeutic activity, 97112 neuromuscular re-education, 97140 manual therapy, 97035 ultrasound, 97018 paraffin, 38182 fluidotherapy, 97010 moist heat, 97010 cryotherapy, 97760 Orthotic Initial, 97761 Prosthetic Initial, 97763 Orthotic/Prosthetic subsequent, passive range of motion, functional mobility training, energy conservation, patient/family education, and DME and/or AE instructions  RECOMMENDED OTHER SERVICES: N/A  CONSULTED AND AGREED WITH PLAN OF CARE: Patient  PLAN FOR NEXT SESSION:  Per [place]  Hand Protocol 5th Edition  HEP: tendon glides, per [place]  Hand Protocol 5th Edition handouts Scar massage Assess scar site Joint protection handout A/E and adaptive strategies Sensory safety precautions  Zora Hires, OT 11/13/2023, 4:26 PM

## 2023-11-13 NOTE — Patient Instructions (Addendum)
  Access Code: [email or web address] URL: [email or web address] Date: 11/13/2023 Prepared by: Sudie Ely  Exercises - Putty Squeezes  - 1-2 x daily - 10 reps - Rolling Putty on Table  - 1-2 x daily - 10 reps - Finger Pinch and Pull with Putty  - 1-2 x daily - 10 reps - 3-Point Pinch with Putty  - 1-2 x daily - 10 reps - Tip PUSH with Putty  - 1-2 x daily - 10 reps - Key Pinch with Putty  - 1-2 x daily - 10 reps - Finger Extension with Putty  - 1-2 x daily - 10 reps - Finger Adduction with Putty  - 1-2 x daily - 10 reps - Removing Marbles from Putty  - 1-2 x daily - 10 reps  WEBSITE: [email or web address]

## 2023-11-15 ENCOUNTER — Ambulatory Visit (INDEPENDENT_AMBULATORY_CARE_PROVIDER_SITE_OTHER): Payer: Self-pay | Admitting: Nurse Practitioner

## 2023-11-15 ENCOUNTER — Encounter: Payer: Self-pay | Admitting: Nurse Practitioner

## 2023-11-15 ENCOUNTER — Ambulatory Visit: Attending: Orthopedic Surgery | Admitting: Occupational Therapy

## 2023-11-15 VITALS — BP 133/68 | HR 74 | Temp 97.4°F | Ht 61.0 in | Wt 234.0 lb

## 2023-11-15 DIAGNOSIS — R278 Other lack of coordination: Secondary | ICD-10-CM | POA: Diagnosis present

## 2023-11-15 DIAGNOSIS — L905 Scar conditions and fibrosis of skin: Secondary | ICD-10-CM | POA: Insufficient documentation

## 2023-11-15 DIAGNOSIS — M6281 Muscle weakness (generalized): Secondary | ICD-10-CM | POA: Diagnosis present

## 2023-11-15 DIAGNOSIS — E119 Type 2 diabetes mellitus without complications: Secondary | ICD-10-CM | POA: Diagnosis not present

## 2023-11-15 DIAGNOSIS — R208 Other disturbances of skin sensation: Secondary | ICD-10-CM | POA: Diagnosis present

## 2023-11-15 DIAGNOSIS — R29818 Other symptoms and signs involving the nervous system: Secondary | ICD-10-CM | POA: Diagnosis present

## 2023-11-15 LAB — POCT GLYCOSYLATED HEMOGLOBIN (HGB A1C): Hemoglobin A1C: 6 % — AB (ref 4.0–5.6)

## 2023-11-15 MED ORDER — METFORMIN HCL 500 MG PO TABS: 500.0000 mg | ORAL_TABLET | Freq: Two times a day (BID) | ORAL | 3 refills | Status: DC

## 2023-11-15 NOTE — Patient Instructions (Addendum)
  Avoid tight, continuous gripping of hand tools or other objects in palm  Reduce time spent in positions where wrist is fully bent forwards or backwards  Reduce time using tools and equipment with vibration (e.g. lawnmowers, electric drills)  When completing tasks with repetitive wrist motion, take a 30 minute breaks for every hour of tasks  Set up ergonomic work station  Consider gloves with padded palm (e.g. biking gloves) for tasks which cause pressure at base of palm          *Adapted from Margie Ege, S., et al., Diagnosis and Treatment Manual for Physicians & Therapists - 5th Edition: Upper Extremity Patient Handouts, Volume 3, [place] [facility], 2020

## 2023-11-15 NOTE — Progress Notes (Signed)
 Subjective   Patient ID: Hannah Huerta, female    DOB: Sep 25, 1960, 63 y.o.   MRN: [id number]  Chief Complaint  Patient presents with   Diabetes    Referring provider: Jerrlyn Morel, NP  Hannah Huerta is a 63 y.o. female with Past Medical History: No date: Abnormal Pap smear of cervix     Comment:  pt couldnt state what type of abnormality No date: Anemia No date: Arthritis No date: Carpal tunnel syndrome, bilateral No date: CHF (congestive heart failure) (HCC) No date: Chronic migraine No date: Diabetes mellitus without complication (HCC) No date: GERD (gastroesophageal reflux disease) No date: HLD (hyperlipidemia) No date: Hypertension 06/05/2015: Hypokalemia 06/05/2015: Metabolic syndrome No date: Morbid obesity with BMI of 50.0-59.9, adult (HCC) No date: Prediabetes 06/05/2015: S/P cardiac catheterization, non obstructive disease 11/ 18/16 No date: Sickle cell trait (HCC) No date: Spondylolisthesis of lumbar region No date: Syncope   HPI  Hypertension: Patient here for follow-up of elevated blood pressure. She is exercising and is adherent to low salt diet. Vital signs stable in office today. Cardiac symptoms none. Patient denies chest pain, exertional chest pressure/discomfort, irregular heart beat, and near-syncope.  Cardiovascular risk factors: diabetes mellitus, hypertension, and obesity (BMI >= 30 kg/m2). Use of agents associated with hypertension: none. History of target organ damage: none.     Diabetes Mellitus: Patient presents for follow up of diabetes. Symptoms: none. Patient denies none.  Evaluation to date has been included: hemoglobin A1C.  Home sugars: patient does not check sugars. Treatment to date: no recent interventions.  A1C in office today is 6.0.       Denies f/c/s, n/v/d, hemoptysis, PND, leg swelling Denies chest pain or edema     No Known Allergies  Immunization History  Administered Date(s) Administered   Influenza,inj,Quad PF,6+  Mos 04/28/2014, 07/25/2016   Janssen (J&J) SARS-COV-2 Vaccination 11/03/2019   Pneumococcal Polysaccharide-23 04/28/2014, 03/01/2017   Tdap 09/15/2012    Tobacco History: Social History   Tobacco Use  Smoking Status Never   Passive exposure: Past  Smokeless Tobacco Never   Counseling given: Not Answered   Outpatient Encounter Medications as of 11/15/2023  Medication Sig   acetaminophen  (TYLENOL ) 325 MG tablet Take 2 tablets (650 mg total) by mouth every 6 (six) hours as needed.   amLODipine  (NORVASC ) 5 MG tablet TAKE 2 TABLETS(10 MG) BY MOUTH DAILY   aspirin  EC 81 MG tablet Take 1 tablet (81 mg total) by mouth daily.   atorvastatin  (LIPITOR ) 40 MG tablet Take 1 tablet (40 mg total) by mouth daily.   carvedilol  (COREG ) 12.5 MG tablet TAKE 1 TABLET(12.5 MG) BY MOUTH TWICE DAILY WITH A MEAL   spironolactone  (ALDACTONE ) 25 MG tablet Take 1 tablet (25 mg total) by mouth daily.   [DISCONTINUED] metFORMIN  (GLUCOPHAGE ) 500 MG tablet Take 1 tablet (500 mg total) by mouth 2 (two) times daily with a meal.   gabapentin  (NEURONTIN ) 100 MG capsule TAKE ONE CAPSULE BY MOUTH AT BEDTIME FOR 180 DAYS, THEN 2 CAPSULES AT BEDTIME (Patient not taking: Reported on 11/15/2023)   ibuprofen  (ADVIL ) 600 MG tablet Take 1 tablet (600 mg total) by mouth every 6 (six) hours as needed. (Patient not taking: Reported on 11/15/2023)   lidocaine  (LIDODERM ) 5 % Place 1 patch onto the skin daily as needed. Remove & Discard patch within 12 hours or as directed by MD (Patient not taking: Reported on 11/15/2023)   metFORMIN  (GLUCOPHAGE ) 500 MG tablet Take 1 tablet (500 mg total) by  mouth 2 (two) times daily with a meal.   naproxen  (NAPROSYN ) 375 MG tablet Take 1 tablet (375 mg total) by mouth 2 (two) times daily. (Patient not taking: Reported on 11/15/2023)   oxyCODONE  (ROXICODONE ) 5 MG immediate release tablet Take 1 tablet (5 mg total) by mouth every 6 (six) hours as needed for severe pain (pain score 7-10). (Patient not taking:  Reported on 11/15/2023)   polyethylene glycol (MIRALAX ) 17 g packet Take 17 g by mouth daily. (Patient not taking: Reported on 11/15/2023)   Replens Vaginal Moisturizer GEL Place 1 Application vaginally every 3 (three) days. (Patient not taking: Reported on 11/15/2023)   No facility-administered encounter medications on file as of 11/15/2023.    Review of Systems  Review of Systems  Constitutional: Negative.   HENT: Negative.    Cardiovascular: Negative.   Gastrointestinal: Negative.   Allergic/Immunologic: Negative.   Neurological: Negative.   Psychiatric/Behavioral: Negative.       Objective:   BP 133/68   Pulse 74   Temp (!) 97.4 F (36.3 C)   Ht 5\' 1"  (1.549 m)   Wt 234 lb (106.1 kg)   LMP 09/15/2013   SpO2 99%   BMI 44.21 kg/m   Wt Readings from Last 5 Encounters:  11/15/23 234 lb (106.1 kg)  08/31/23 233 lb 12.8 oz (106.1 kg)  08/19/23 237 lb (107.5 kg)  08/17/23 237 lb (107.5 kg)  05/04/23 232 lb 3.2 oz (105.3 kg)     Physical Exam Vitals and nursing note reviewed.  Constitutional:      General: She is not in acute distress.    Appearance: She is well-developed.  Cardiovascular:     Rate and Rhythm: Normal rate and regular rhythm.  Pulmonary:     Effort: Pulmonary effort is normal.     Breath sounds: Normal breath sounds.  Neurological:     Mental Status: She is alert and oriented to person, place, and time.       Assessment & Plan:   Type 2 diabetes mellitus without complication, without long-term current use of insulin  (HCC) -     POCT glycosylated hemoglobin (Hb A1C) -     Microalbumin / creatinine urine ratio -     metFORMIN  HCl; Take 1 tablet (500 mg total) by mouth 2 (two) times daily with a meal.  Dispense: 90 tablet; Refill: 3 -     CBC -     Basic metabolic panel with GFR     Return in about 3 months (around 02/15/2024).   Jerrlyn Morel, NP 11/15/2023

## 2023-11-15 NOTE — Therapy (Signed)
 OUTPATIENT OCCUPATIONAL THERAPY ORTHO TREATMENT  Patient Name: Hannah Huerta MRN: [id number] DOB:1961/05/05, 63 y.o., female Today's Date: 11/15/2023  PCP: Rudine Cos  REFERRING PROVIDER: Merrill Abide, MD   END OF SESSION:  OT End of Session - 11/15/23 1459     Visit Number 3    Number of Visits 13   including eval   Date for OT Re-Evaluation 01/04/24    Authorization Type [employer] Dual 2025, Covered 100%, VL: [place]    OT Start Time 1155    OT Stop Time 1235    OT Time Calculation (min) 40 min    Activity Tolerance Patient tolerated treatment well    Behavior During Therapy WFL for tasks assessed/performed              Past Medical History:  Diagnosis Date   Abnormal Pap smear of cervix    pt couldnt state what type of abnormality   Anemia    Arthritis    Carpal tunnel syndrome, bilateral    CHF (congestive heart failure) (HCC)    Chronic migraine    Diabetes mellitus without complication (HCC)    GERD (gastroesophageal reflux disease)    HLD (hyperlipidemia)    Hypertension    Hypokalemia 06/05/2015   Metabolic syndrome 06/05/2015   Morbid obesity with BMI of 50.0-59.9, adult (HCC)    Prediabetes    S/P cardiac catheterization, non obstructive disease 06/04/15 06/05/2015   Sickle cell trait (HCC)    Spondylolisthesis of lumbar region    Syncope    Past Surgical History:  Procedure Laterality Date   BACK SURGERY     x1 lumbar fusion   CARDIAC CATHETERIZATION N/A 06/04/2015   Procedure: Left Heart Cath and Coronary Angiography;  Surgeon: Arty Binning, MD;  Location: [facility] INVASIVE CV LAB;  Service: Cardiovascular;  Laterality: N/A;   CHOLECYSTECTOMY     laparoscopic   COLONOSCOPY WITH PROPOFOL  N/A 09/04/2014   Procedure: COLONOSCOPY WITH PROPOFOL ;  Surgeon: Kenney Peacemaker, MD;  Location: [facility] ENDOSCOPY;  Service: Endoscopy;  Laterality: N/A;   LEFT HEART CATHETERIZATION WITH CORONARY ANGIOGRAM N/A 03/08/2012   Procedure: LEFT HEART CATHETERIZATION WITH  CORONARY ANGIOGRAM;  Surgeon: Kristopher Pheasant, MD;  Location: [facility] CATH LAB;  Service: Cardiovascular;  Laterality: N/A;   SPINE SURGERY     fusion   TUBAL LIGATION     Patient Active Problem List   Diagnosis Date Noted   Pain in joint of right hip 04/12/2023   Cervical cancer screening 12/21/2022   Type 2 diabetes mellitus without complication, without long-term current use of insulin  (HCC) 08/04/2022   Medial orbital wall fracture, closed, initial encounter (HCC) 10/03/2021   Bilateral carpal tunnel syndrome 09/22/2021   Rheumatoid factor positive 09/22/2021   Bilateral hand swelling 09/22/2021   Spondylolisthesis 05/06/2021   Uncontrolled type 2 diabetes mellitus with microalbuminuria 03/28/2021   Foot-drop 01/04/2021   Status post lumbar spinal fusion 02/12/2020   Spondylolisthesis of lumbar region 02/05/2020   Arthropathy of lumbar facet joint 12/11/2019   Chronic right-sided low back pain with right-sided sciatica 11/04/2019   Other chronic pain 11/04/2019   DDD (degenerative disc disease), lumbar 11/04/2019   Essential hypertension 11/04/2019   Body mass index (BMI) 50.0-59.9, adult (HCC) 11/04/2019   Spinal stenosis of lumbar region 11/04/2019   Spondylolisthesis, lumbar region 11/04/2019   Vitamin D  deficiency 09/10/2019   CHF (congestive heart failure) (HCC) 01/30/2018   OSA (obstructive sleep apnea) 03/01/2017   ARF (acute renal failure) (  HCC) 02/28/2017   Hyponatremia 02/28/2017   Syncope 07/23/2016   Syncope and collapse 07/23/2016   S/P cardiac catheterization, non obstructive disease 06/04/15 06/05/2015   CAD in native artery 06/05/2015   Fever 06/05/2015   Hypokalemia 06/05/2015   Metabolic syndrome 06/05/2015   Chest pain, neg MI, possible GI 06/04/2015   Colon cancer screening 08/05/2014   Severe obesity (BMI >= 40) (HCC) 08/05/2014   Health care maintenance 05/13/2014   Prediabetes 05/13/2014   Chronic migraine 04/27/2014   Atypical chest pain 04/27/2014    SOB (shortness of breath) 09/16/2013   Palpitations 09/16/2013   HTN (hypertension) 03/09/2012   Hyperlipidemia 03/09/2012   Microcytic anemia    Morbid obesity with BMI of 50.0-59.9, adult (HCC)    Precordial chest pain 03/06/2012   Dizziness 03/06/2012    ONSET DATE: 11/05/2023 (referral date), s/p L carpal tunnel release 10/08/23  REFERRING DIAG: Z98.890 (ICD-10-CM) - S/P carpal tunnel release   THERAPY DIAG:  Muscle weakness (generalized)  Other lack of coordination  Other disturbances of skin sensation  Other symptoms and signs involving the nervous system  Rationale for Evaluation and Treatment: Rehabilitation  SUBJECTIVE:   SUBJECTIVE STATEMENT: Pt replaced tip of cane. Pt reported hand is "definitely getting better." Pt reported completing scar massage every day, several times per day. Pt reported completing HEP, no questions/concerns.  Pt accompanied by: self  PERTINENT HISTORY: s/p Lt carpal tunnel release, B carpal tunnel, s/p PLIF L2-3 and L3-4 7/22 due to chronic spinal stenosis with radiculopathy and neurogenic claudication. PMH includes L4-5 decompression and fusion, CAD, CHF, DM II, HLD, HTN, morbid obesity   Per 11/05/23 Referral Noes: Scar massage; s/p carpal tunnel release   PRECAUTIONS: Per [place]  Hand Protocol 5th Edition, Per 11/05/23 MD Progress Notes: "At this juncture, she can continue with activities as tolerated."   RED FLAGS: None   WEIGHT BEARING RESTRICTIONS: No  PAIN:  Are you having pain? 3/10 around incision site  FALLS: Has patient fallen in last 6 months? Yes. Number of falls : No  LIVING ENVIRONMENT: Lives with: lives alone Lives in: House/apartment Stairs: Yes: External: 1 steps; grab bar Has following equipment at home: Single point cane, Walker - 2 wheeled, and shower chair  PLOF: Independent with basic ADLs, IADLs, on disability  PATIENT GOALS: "I would like to be able to get the numbness to go away, to move it and do  what I did before the carpal tunnel happened."  NEXT MD VISIT: Nov 27, 2023  OBJECTIVE:  Note: Objective measures were completed at Evaluation unless otherwise noted.  HAND DOMINANCE: Right  ADLs: Eating: ind Grooming: ind, difficulty with hair styling "but getting better" Upper body dressing: ind, difficulty with buttons Lower body dressing: ind, a little difficulty with donning pants d/t decreased pinch strength of LUE Bathing: currently sponge bathing to protect surgical site though plans to start soon with standard shower though noted "everything takes longer"   Playing tambourine in church - difficulty, has not attempted since the surgery  Laundry - family assists  Dishwashing - slower though ind  Handwriting - difficulty d/t numbness of RUE  FUNCTIONAL OUTCOME MEASURES: Quick Dash: 47.7% deficit    UPPER EXTREMITY ROM:     Active ROM Right eval Left eval  Shoulder flexion [facility]  Shoulder abduction    Shoulder adduction    Shoulder extension    Shoulder internal rotation    Shoulder external rotation    Elbow flexion    Elbow extension  Wrist flexion    Wrist extension    Wrist ulnar deviation    Wrist radial deviation    Wrist pronation    Wrist supination    (Blank rows = not tested)  Active ROM Right eval Left eval  Thumb MCP (0-60)    Thumb IP (0-80)    Thumb Radial abd/add (0-55)     Thumb Palmar abd/add (0-45)     Thumb Opposition to Small Finger     Index MCP (0-90)     Index PIP (0-100)     Index DIP (0-70)      Long MCP (0-90)      Long PIP (0-100)      Long DIP (0-70)      Ring MCP (0-90)      Ring PIP (0-100)      Ring DIP (0-70)      Little MCP (0-90)      Little PIP (0-100)      Little DIP (0-70)      (Blank rows = not tested)  LUE AROM digit flex/ext WFL though pt unable to make tight grasp.  LUE AROM thumb flex slightly impaired compared to RUE. RUE AROM digit flex/ext WFL.   HAND FUNCTION: Grip strength: Right: 53.5,  51.8, 48.9 (51.4 lbs average) lbs; Left: 15.4 lbs  COORDINATION: 9 Hole Peg test: Right: 25 sec; Left: 21 sec  Pt reported RUE feels "weak" likely secondary to CTS symptoms.  SENSATION: LUE: mild symptoms of tingling at tip of thumb  RUE: moderate symptoms of tingling/numbness "whole hand"  Pt has splint for both hands but mainly only wears splint on R hand at night since surgery was completed on LUE.   EDEMA: mild edema of LUE  COGNITION: Overall cognitive status: Within functional limits for tasks assessed  OBSERVATIONS: Pt demo'd unsteady gait and wall-surfing strategy to maintain balance using single-tip cane with broken rubber cane tip. Pt reported feeling unsteady d/t cane is "slippery" on tile surface. OT provided pt with cane from clinic and pt demo'd improved steadiness of gait. Pt wore AFO on R foot.  OT assessed LUE scar site: Noted loose, dead skin at B edges of scar peeling back though scar ultimately healed well, new pink skin underneath dead skin, no s/s of infection. OT noted tightness of LUE scar. ?Presence of stitch   TODAY'S TREATMENT:                                                                                                                             Manual OT completed scar massage at incision site of affected hand - horizontal, vertical, zig-zag, circles - to decrease scar tissue, for scar management, to decrease tightness. Some loose skin still noted on B edges of scar though improved compared to eval presentation.   OT reiterated education on Scar massage per previous handout provided and on skin care of scar site. Pt acknowledged understanding.   Therapeutic Exercises:  Pt placed BUE in Fluidotherapy machine with supervised ROM x 10 min. Pt was educated to complete BUE AROM tendon glides, wrist flex/ext, wrist circumduction during modality time to improve ROM, provide sensory input to scar site, and decrease pain/stiffness of affected extremity by use  of the machine's massaging action and thermal properties.    Self-Care OT educated pt on desensitization, UE anatomy, scar tissue, scar tissue management. Pt acknowledged understanding.  OT educated pt on Desensitization at scar site. Pt acknowledged understanding and returned demo using washcloth and Velcro, which pt tolerated well. Pt reported mild sensitivity at distal end of scar and OT noted area appeared recently healed with pink skin, which likely contributed to sensitivity. OT recommended to pt to use LUE for daily tasks and scar massage for desensitization.   OT educated pt on joint protection. Handout provided, see pt instructions. Pt acknowledged understanding.   PATIENT EDUCATION: Education details: see today's tx above Person educated: Patient Education method: Explanation, Demonstration, Tactile cues, Verbal cues, and Handouts Education comprehension: verbalized understanding, returned demonstration, verbal cues required, tactile cues required, and needs further education  HOME EXERCISE PROGRAM: 11/08/23 - scar massage (see pt instructions) 11/14/23 - Sleep positions, Tendon Glides and Intro to putty exercises 11/15/23 - joint protection (see pt instructions)  GOALS: Goals reviewed with patient? Yes  SHORT TERM GOALS: Target date: 12/07/23  Pt will be ind with HEP using visual handouts. Baseline: new to outpt OT Goal status: IN Progress  2.  Pt will ind recall at least 3 joint protection strategies. Baseline: new to outpt OT Goal status: IN Progress  3.  Pt will return demo of scar massage and edema management techniques. Baseline: OT educated pt on scar massage, pt returned demo.  Goal status: IN Progress  4.  Pt will ind recall at least 3 sensory safety precautions Baseline: pt reported numbness/tingling symptoms of BUE, RUE > LUE Goal status: IN Progress  LONG TERM GOALS: Target date: 01/04/24  Pt will report improved ability for "tight" grasp of LUE as needed  for ADL/IADLs. Baseline: Pt reported difficulty with "tight" grasp of LUE. Goal status: IN Progress  2.  Patient will demonstrate at least 30 lbs LUE grip strength as needed to open jars and other containers.  Baseline: Grip strength: Right: 53.5, 51.8, 48.9 (51.4 lbs average) lbs; Left: 15.4 lbs Goal status: IN Progress  3.  Patient will demonstrate at least 16% improvement with quick Dash score (reporting 31.7% disability or less) indicating improved functional use of affected extremity.  Baseline: Quick Dash: 47.7% deficit Goal status: INITIAL  4.  Pt will demo and/or verbalize understanding of A/E options and adaptive strategies as needed for ADL/IADLs. Baseline: pt reported difficulty with some ADL/IADL tasks, see objective measures above Goal status: INITIAL  ASSESSMENT:  CLINICAL IMPRESSION: Patient is a 63 y.o. female who was seen today for occupational therapy treatment for s/p L carpal tunnel release 10/08/23. Pt tolerated tasks well, demo'ing reduced sensitivity. Continued "tightness" of scar site noted though improved compared to previous sessions. Increased "tightness" of distal end of scar compared to proximal end of scar. Pt will benefit from continued skilled OT services in the outpatient setting to work on impairments as noted at evaluation to help pt return to [facility] as able.    PERFORMANCE DEFICITS: in functional skills including ADLs, IADLs, coordination, dexterity, proprioception, sensation, edema, ROM, strength, flexibility, Fine motor control, Gross motor control, mobility, balance, body mechanics, endurance, and UE functional use, cognitive skills including energy/drive, and  psychosocial skills including environmental adaptation.   IMPAIRMENTS: are limiting patient from ADLs, IADLs, rest and sleep, leisure, and social participation.   COMORBIDITIES: may have co-morbidities  that affects occupational performance. Patient will benefit from skilled OT to address above  impairments and improve overall function.  REHAB POTENTIAL: Good  PLAN:  OT FREQUENCY: 1x-2x/week (anticipate 2x per week initially, may decrease to 1x per week PRN)  OT DURATION: 6 weeks  PLANNED INTERVENTIONS: 97168 OT Re-evaluation, 97535 self care/ADL training, 78295 therapeutic exercise, 97530 therapeutic activity, 97112 neuromuscular re-education, 97140 manual therapy, 97035 ultrasound, 97018 paraffin, 62130 fluidotherapy, 97010 moist heat, 97010 cryotherapy, 97760 Orthotic Initial, 97761 Prosthetic Initial, 97763 Orthotic/Prosthetic subsequent, passive range of motion, functional mobility training, energy conservation, patient/family education, and DME and/or AE instructions  RECOMMENDED OTHER SERVICES: N/A  CONSULTED AND AGREED WITH PLAN OF CARE: Patient  PLAN FOR NEXT SESSION:  Per [place]  Hand Protocol 5th Edition  HEP: per [place]  Hand Protocol 5th Edition handouts Scar massage/management Fluidotherapy  Wrist ROM A/E and adaptive strategies Sensory safety precautions  Oakley Bellman, OT 11/15/2023, 3:07 PM

## 2023-11-16 LAB — BASIC METABOLIC PANEL WITH GFR
BUN/Creatinine Ratio: 11 — ABNORMAL LOW (ref 12–28)
BUN: 10 mg/dL (ref 8–27)
CO2: 26 mmol/L (ref 20–29)
Calcium: 9.8 mg/dL (ref 8.7–10.3)
Chloride: 102 mmol/L (ref 96–106)
Creatinine, Ser: 0.89 mg/dL (ref 0.57–1.00)
Glucose: 95 mg/dL (ref 70–99)
Potassium: 4.3 mmol/L (ref 3.5–5.2)
Sodium: 140 mmol/L (ref 134–144)
eGFR: 73 mL/min/{1.73_m2} (ref >59)

## 2023-11-16 LAB — CBC
Hematocrit: 33 % — ABNORMAL LOW (ref 34.0–46.6)
Hemoglobin: 10.6 g/dL — ABNORMAL LOW (ref 11.1–15.9)
MCH: 25.4 pg — ABNORMAL LOW (ref 26.6–33.0)
MCHC: 32.1 g/dL (ref 31.5–35.7)
MCV: 79 fL (ref 79–97)
Platelets: 270 10*3/uL (ref 150–450)
RBC: 4.17 x10E6/uL (ref 3.77–5.28)
RDW: 14.9 % (ref 11.7–15.4)
WBC: 3.4 10*3/uL (ref 3.4–10.8)

## 2023-11-16 LAB — MICROALBUMIN / CREATININE URINE RATIO
Creatinine, Urine: 98.4 mg/dL
Microalb/Creat Ratio: 168 mg/g{creat} — ABNORMAL HIGH (ref 0–29)
Microalbumin, Urine: 165.1 ug/mL

## 2023-11-20 ENCOUNTER — Ambulatory Visit: Admitting: Occupational Therapy

## 2023-11-20 DIAGNOSIS — R208 Other disturbances of skin sensation: Secondary | ICD-10-CM

## 2023-11-20 DIAGNOSIS — R278 Other lack of coordination: Secondary | ICD-10-CM

## 2023-11-20 DIAGNOSIS — L905 Scar conditions and fibrosis of skin: Secondary | ICD-10-CM

## 2023-11-20 DIAGNOSIS — M6281 Muscle weakness (generalized): Secondary | ICD-10-CM

## 2023-11-20 NOTE — Therapy (Signed)
 OUTPATIENT OCCUPATIONAL THERAPY ORTHO TREATMENT  Patient Name: Hannah Huerta MRN: [id number] DOB:[birth year], 63 y.o., female Today's Date: 11/20/2023  PCP: Rudine Cos  REFERRING PROVIDER: Merrill Abide, MD   END OF SESSION:  OT End of Session - 11/20/23 8413     Visit Number 4    Number of Visits 13   including eval   Date for OT Re-Evaluation 01/04/24    Authorization Type [employer] Dual 2025, Covered 100%, VL: [place]    OT Start Time 0839    OT Stop Time 0932    OT Time Calculation (min) 53 min    Equipment Utilized During Treatment Fluidotherapy, Cups    Activity Tolerance Patient tolerated treatment well    Behavior During Therapy WFL for tasks assessed/performed              Past Medical History:  Diagnosis Date   Abnormal Pap smear of cervix    pt couldnt state what type of abnormality   Anemia    Arthritis    Carpal tunnel syndrome, bilateral    CHF (congestive heart failure) (HCC)    Chronic migraine    Diabetes mellitus without complication (HCC)    GERD (gastroesophageal reflux disease)    HLD (hyperlipidemia)    Hypertension    Hypokalemia 06/05/2015   Metabolic syndrome 06/05/2015   Morbid obesity with BMI of 50.0-59.9, adult (HCC)    Prediabetes    S/P cardiac catheterization, non obstructive disease 06/04/15 06/05/2015   Sickle cell trait (HCC)    Spondylolisthesis of lumbar region    Syncope    Past Surgical History:  Procedure Laterality Date   BACK SURGERY     x1 lumbar fusion   CARDIAC CATHETERIZATION N/A 06/04/2015   Procedure: Left Heart Cath and Coronary Angiography;  Surgeon: Arty Binning, MD;  Location: [facility] INVASIVE CV LAB;  Service: Cardiovascular;  Laterality: N/A;   CHOLECYSTECTOMY     laparoscopic   COLONOSCOPY WITH PROPOFOL  N/A 09/04/2014   Procedure: COLONOSCOPY WITH PROPOFOL ;  Surgeon: Kenney Peacemaker, MD;  Location: [facility] ENDOSCOPY;  Service: Endoscopy;  Laterality: N/A;   LEFT HEART CATHETERIZATION WITH CORONARY ANGIOGRAM N/A  03/08/2012   Procedure: LEFT HEART CATHETERIZATION WITH CORONARY ANGIOGRAM;  Surgeon: Kristopher Pheasant, MD;  Location: [facility] CATH LAB;  Service: Cardiovascular;  Laterality: N/A;   SPINE SURGERY     fusion   TUBAL LIGATION     Patient Active Problem List   Diagnosis Date Noted   Pain in joint of right hip 04/12/2023   Cervical cancer screening 12/21/2022   Type 2 diabetes mellitus without complication, without long-term current use of insulin  (HCC) 08/04/2022   Medial orbital wall fracture, closed, initial encounter (HCC) 10/03/2021   Bilateral carpal tunnel syndrome 09/22/2021   Rheumatoid factor positive 09/22/2021   Bilateral hand swelling 09/22/2021   Spondylolisthesis 05/06/2021   Uncontrolled type 2 diabetes mellitus with microalbuminuria 03/28/2021   Foot-drop 01/04/2021   Status post lumbar spinal fusion 02/12/2020   Spondylolisthesis of lumbar region 02/05/2020   Arthropathy of lumbar facet joint 12/11/2019   Chronic right-sided low back pain with right-sided sciatica 11/04/2019   Other chronic pain 11/04/2019   DDD (degenerative disc disease), lumbar 11/04/2019   Essential hypertension 11/04/2019   Body mass index (BMI) 50.0-59.9, adult (HCC) 11/04/2019   Spinal stenosis of lumbar region 11/04/2019   Spondylolisthesis, lumbar region 11/04/2019   Vitamin D  deficiency 09/10/2019   CHF (congestive heart failure) (HCC) 01/30/2018   OSA (obstructive  sleep apnea) 03/01/2017   ARF (acute renal failure) (HCC) 02/28/2017   Hyponatremia 02/28/2017   Syncope 07/23/2016   Syncope and collapse 07/23/2016   S/P cardiac catheterization, non obstructive disease 06/04/15 06/05/2015   CAD in native artery 06/05/2015   Fever 06/05/2015   Hypokalemia 06/05/2015   Metabolic syndrome 06/05/2015   Chest pain, neg MI, possible GI 06/04/2015   Colon cancer screening 08/05/2014   Severe obesity (BMI >= 40) (HCC) 08/05/2014   Health care maintenance 05/13/2014   Prediabetes 05/13/2014    Chronic migraine 04/27/2014   Atypical chest pain 04/27/2014   SOB (shortness of breath) 09/16/2013   Palpitations 09/16/2013   HTN (hypertension) 03/09/2012   Hyperlipidemia 03/09/2012   Microcytic anemia    Morbid obesity with BMI of 50.0-59.9, adult (HCC)    Precordial chest pain 03/06/2012   Dizziness 03/06/2012    ONSET DATE: 11/05/2023 (referral date), s/p L carpal tunnel release 10/08/23  REFERRING DIAG: Z98.890 (ICD-10-CM) - S/P carpal tunnel release   THERAPY DIAG:  Muscle weakness (generalized)  Scar condition and fibrosis of skin  Other lack of coordination  Other disturbances of skin sensation  Rationale for Evaluation and Treatment: Rehabilitation  SUBJECTIVE:   SUBJECTIVE STATEMENT: Pt reported hand is still getting better including decreased numbness and pain ie) more just uncomfortable.  She is still still wearing her L hand splint at night.  Pt reports completing scar massage and HEP without difficulty.  Pt accompanied by: self  PERTINENT HISTORY: s/p Lt carpal tunnel release, B carpal tunnel, s/p PLIF L2-3 and L3-4 7/22 due to chronic spinal stenosis with radiculopathy and neurogenic claudication. PMH includes L4-5 decompression and fusion, CAD, CHF, DM II, HLD, HTN, morbid obesity   Per 11/05/23 Referral Noes: Scar massage; s/p carpal tunnel release   PRECAUTIONS: Per [place]  Hand Protocol 5th Edition, Per 11/05/23 MD Progress Notes: "At this juncture, she can continue with activities as tolerated."   RED FLAGS: None   WEIGHT BEARING RESTRICTIONS: No  PAIN:  Are you having pain? 3/10 around incision site - more uncomfortable verus pain  FALLS: Has patient fallen in last 6 months? Yes. Number of falls : No  LIVING ENVIRONMENT: Lives with: lives alone Lives in: House/apartment Stairs: Yes: External: 1 steps; grab bar Has following equipment at home: Single point cane, Walker - 2 wheeled, and shower chair  PLOF: Independent with basic ADLs, IADLs,  on disability  PATIENT GOALS: "I would like to be able to get the numbness to go away, to move it and do what I did before the carpal tunnel happened."  NEXT MD VISIT: Nov 27, 2023  OBJECTIVE:  Note: Objective measures were completed at Evaluation unless otherwise noted.  HAND DOMINANCE: Right  ADLs: Eating: ind Grooming: ind, difficulty with hair styling "but getting better" Upper body dressing: ind, difficulty with buttons Lower body dressing: ind, a little difficulty with donning pants d/t decreased pinch strength of LUE Bathing: currently sponge bathing to protect surgical site though plans to start soon with standard shower though noted "everything takes longer"   Playing tambourine in church - difficulty, has not attempted since the surgery  Laundry - family assists  Dishwashing - slower though ind  Handwriting - difficulty d/t numbness of RUE  FUNCTIONAL OUTCOME MEASURES: Quick Dash: 47.7% deficit    UPPER EXTREMITY ROM:     Active ROM Right eval Left eval  Shoulder flexion [facility]  Shoulder abduction    Shoulder adduction    Shoulder extension  Shoulder internal rotation    Shoulder external rotation    Elbow flexion    Elbow extension    Wrist flexion    Wrist extension    Wrist ulnar deviation    Wrist radial deviation    Wrist pronation    Wrist supination    (Blank rows = not tested)  Active ROM Right eval Left eval  Thumb MCP (0-60)    Thumb IP (0-80)    Thumb Radial abd/add (0-55)     Thumb Palmar abd/add (0-45)     Thumb Opposition to Small Finger     Index MCP (0-90)     Index PIP (0-100)     Index DIP (0-70)      Long MCP (0-90)      Long PIP (0-100)      Long DIP (0-70)      Ring MCP (0-90)      Ring PIP (0-100)      Ring DIP (0-70)      Little MCP (0-90)      Little PIP (0-100)      Little DIP (0-70)      (Blank rows = not tested)  LUE AROM digit flex/ext WFL though pt unable to make tight grasp.  LUE AROM thumb flex  slightly impaired compared to RUE. RUE AROM digit flex/ext WFL.   HAND FUNCTION: Grip strength: Right: 53.5, 51.8, 48.9 (51.4 lbs average) lbs; Left: 15.4 lbs  COORDINATION: 9 Hole Peg test: Right: 25 sec; Left: 21 sec  Pt reported RUE feels "weak" likely secondary to CTS symptoms.  SENSATION: LUE: mild symptoms of tingling at tip of thumb  RUE: moderate symptoms of tingling/numbness "whole hand"  Pt has splint for both hands but mainly only wears splint on R hand at night since surgery was completed on LUE.   EDEMA: mild edema of LUE  COGNITION: Overall cognitive status: Within functional limits for tasks assessed  OBSERVATIONS: Pt demo'd unsteady gait and wall-surfing strategy to maintain balance using single-tip cane with broken rubber cane tip. Pt reported feeling unsteady d/t cane is "slippery" on tile surface. OT provided pt with cane from clinic and pt demo'd improved steadiness of gait. Pt wore AFO on R foot.  OT assessed LUE scar site: Noted loose, dead skin at B edges of scar peeling back though scar ultimately healed well, new pink skin underneath dead skin, no s/s of infection. OT noted tightness of LUE scar. ?Presence of stitch   TODAY'S TREATMENT:                                                                                                                             Manual OT completed scar massage at incision site of affected hand - horizontal, vertical, zig-zag, circles - to decrease scar tissue, for scar management, to decrease tightness. In addition, myofascial cupping with emulsifying lotion used carpal tunnel/palmar region. Cups placed along scar and OT used continuous gliding motions  and brief static techniques to promote tissue release and increase local circulation. No adverse reaction observed during or after treatment.  The patient tolerated the procedure well, and the treatment focused on breaking up fascial adhesions, improving soft tissue mobility, and  enhancing circulation to the affected areas, to promote improved increased mobility of L carpal tunnel/wrist.  Pt reported improvement in flexibility, ROM and comfort of of LUE.   OT reiterated education on Scar massage per previous handout provided and on skin care of scar site and provided silicone insert for use at night inside splint for scar remodeling.  Pt acknowledged understanding.   Therapeutic Exercises:  Pt placed BUE in Fluidotherapy machine with supervised ROM x 10 min. Pt was educated to complete BUE AROM tendon glides, wrist flex/ext, wrist circumduction during modality time to improve ROM, provide sensory input to scar site, and decrease pain/stiffness of affected extremity by use of the machine's massaging action and thermal properties.    Self-Care  OT provided further education on joint protection, modified techniques and AE considerations. Suggestions provided re: changing grasp to keep wrist neutral including using splints for some activities ie) picking up pots, cutting food and carrying objects with pt encouraged to consider AE for arthritis and UE weakness ie) stroke or SCI for online searches of equipment options.  Pt acknowledged understanding.  PATIENT EDUCATION: Education details: Scar massage and jt protecion Person educated: Patient Education method: Explanation, Demonstration, Tactile cues, and Verbal cues Education comprehension: verbalized understanding, returned demonstration, verbal cues required, tactile cues required, and needs further education  HOME EXERCISE PROGRAM: 11/08/23 - scar massage (see pt instructions) 11/14/23 - Sleep positions, Tendon Glides and Intro to putty exercises 11/15/23 - joint protection (see pt instructions)  GOALS: Goals reviewed with patient? Yes  SHORT TERM GOALS: Target date: 12/07/23  Pt will be ind with HEP using visual handouts. Baseline: new to outpt OT Goal status: IN Progress  2.  Pt will ind recall at least 3 joint  protection strategies. Baseline: new to outpt OT Goal status: IN Progress  3.  Pt will return demo of scar massage and edema management techniques. Baseline: OT educated pt on scar massage, pt returned demo.  Goal status: IN Progress  4.  Pt will ind recall at least 3 sensory safety precautions Baseline: pt reported numbness/tingling symptoms of BUE, RUE > LUE Goal status: IN Progress  LONG TERM GOALS: Target date: 01/04/24  Pt will report improved ability for "tight" grasp of LUE as needed for ADL/IADLs. Baseline: Pt reported difficulty with "tight" grasp of LUE. Goal status: IN Progress  2.  Patient will demonstrate at least 30 lbs LUE grip strength as needed to open jars and other containers.  Baseline: Grip strength: Right: 53.5, 51.8, 48.9 (51.4 lbs average) lbs; Left: 15.4 lbs Goal status: IN Progress  3.  Patient will demonstrate at least 16% improvement with quick Dash score (reporting 31.7% disability or less) indicating improved functional use of affected extremity.  Baseline: Quick Dash: 47.7% deficit Goal status: IN Progress  4.  Pt will demo and/or verbalize understanding of A/E options and adaptive strategies as needed for ADL/IADLs. Baseline: pt reported difficulty with some ADL/IADL tasks, see objective measures above Goal status: IN Progress  ASSESSMENT:  CLINICAL IMPRESSION: Patient is a 63 y.o. female who was seen today for occupational therapy treatment for s/p L carpal tunnel release 10/08/23. Pt tolerated tasks well to address continued "tightness" of scar site through modalities, manual activities and provision of silicone insert  for nighttime use with splint. Pt will benefit from continued skilled OT services in the outpatient setting to work on impairments as noted at evaluation to help pt return to [facility] as able.    PERFORMANCE DEFICITS: in functional skills including ADLs, IADLs, coordination, dexterity, proprioception, sensation, edema, ROM, strength,  flexibility, Fine motor control, Gross motor control, mobility, balance, body mechanics, endurance, and UE functional use, cognitive skills including energy/drive, and psychosocial skills including environmental adaptation.   IMPAIRMENTS: are limiting patient from ADLs, IADLs, rest and sleep, leisure, and social participation.   COMORBIDITIES: may have co-morbidities  that affects occupational performance. Patient will benefit from skilled OT to address above impairments and improve overall function.  REHAB POTENTIAL: Good  PLAN:  OT FREQUENCY: 1x-2x/week (anticipate 2x per week initially, may decrease to 1x per week PRN)  OT DURATION: 6 weeks  PLANNED INTERVENTIONS: 97168 OT Re-evaluation, 97535 self care/ADL training, 66440 therapeutic exercise, 97530 therapeutic activity, 97112 neuromuscular re-education, 97140 manual therapy, 97035 ultrasound, 97018 paraffin, 34742 fluidotherapy, 97010 moist heat, 97010 cryotherapy, 97760 Orthotic Initial, 97761 Prosthetic Initial, 97763 Orthotic/Prosthetic subsequent, passive range of motion, functional mobility training, energy conservation, patient/family education, and DME and/or AE instructions  RECOMMENDED OTHER SERVICES: N/A  CONSULTED AND AGREED WITH PLAN OF CARE: Patient  PLAN FOR NEXT SESSION:  Per [place]  Hand Protocol 5th Edition  HEP: per [place]  Hand Protocol 5th Edition handouts Scar massage/management Fluidotherapy/cupping Wrist ROM A/E and adaptive strategies Sensory safety precautions  Zora Hires, OT 11/20/2023, 9:40 AM

## 2023-11-22 ENCOUNTER — Ambulatory Visit: Admitting: Occupational Therapy

## 2023-11-22 DIAGNOSIS — L905 Scar conditions and fibrosis of skin: Secondary | ICD-10-CM

## 2023-11-22 DIAGNOSIS — R278 Other lack of coordination: Secondary | ICD-10-CM

## 2023-11-22 DIAGNOSIS — M6281 Muscle weakness (generalized): Secondary | ICD-10-CM | POA: Diagnosis not present

## 2023-11-22 DIAGNOSIS — R208 Other disturbances of skin sensation: Secondary | ICD-10-CM

## 2023-11-22 NOTE — Therapy (Signed)
 OUTPATIENT OCCUPATIONAL THERAPY ORTHO TREATMENT  Patient Name: Hannah Huerta MRN: [id number] DOB:1961-06-27, 63 y.o., female Today's Date: 11/22/2023  PCP: Rudine Cos  REFERRING PROVIDER: Merrill Abide, MD   END OF SESSION:  OT End of Session - 11/22/23 1551     Visit Number 5    Number of Visits 13   including eval   Date for OT Re-Evaluation 01/04/24    Authorization Type [employer] Dual 2025, Covered 100%, VL: [place]    OT Start Time 0853    OT Stop Time 0931    OT Time Calculation (min) 38 min    Activity Tolerance Patient tolerated treatment well    Behavior During Therapy WFL for tasks assessed/performed               Past Medical History:  Diagnosis Date   Abnormal Pap smear of cervix    pt couldnt state what type of abnormality   Anemia    Arthritis    Carpal tunnel syndrome, bilateral    CHF (congestive heart failure) (HCC)    Chronic migraine    Diabetes mellitus without complication (HCC)    GERD (gastroesophageal reflux disease)    HLD (hyperlipidemia)    Hypertension    Hypokalemia 06/05/2015   Metabolic syndrome 06/05/2015   Morbid obesity with BMI of 50.0-59.9, adult (HCC)    Prediabetes    S/P cardiac catheterization, non obstructive disease 06/04/15 06/05/2015   Sickle cell trait (HCC)    Spondylolisthesis of lumbar region    Syncope    Past Surgical History:  Procedure Laterality Date   BACK SURGERY     x1 lumbar fusion   CARDIAC CATHETERIZATION N/A 06/04/2015   Procedure: Left Heart Cath and Coronary Angiography;  Surgeon: Arty Binning, MD;  Location: [facility] INVASIVE CV LAB;  Service: Cardiovascular;  Laterality: N/A;   CHOLECYSTECTOMY     laparoscopic   COLONOSCOPY WITH PROPOFOL  N/A 09/04/2014   Procedure: COLONOSCOPY WITH PROPOFOL ;  Surgeon: Kenney Peacemaker, MD;  Location: [facility] ENDOSCOPY;  Service: Endoscopy;  Laterality: N/A;   LEFT HEART CATHETERIZATION WITH CORONARY ANGIOGRAM N/A 03/08/2012   Procedure: LEFT HEART CATHETERIZATION WITH  CORONARY ANGIOGRAM;  Surgeon: Kristopher Pheasant, MD;  Location: [facility] CATH LAB;  Service: Cardiovascular;  Laterality: N/A;   SPINE SURGERY     fusion   TUBAL LIGATION     Patient Active Problem List   Diagnosis Date Noted   Pain in joint of right hip 04/12/2023   Cervical cancer screening 12/21/2022   Type 2 diabetes mellitus without complication, without long-term current use of insulin  (HCC) 08/04/2022   Medial orbital wall fracture, closed, initial encounter (HCC) 10/03/2021   Bilateral carpal tunnel syndrome 09/22/2021   Rheumatoid factor positive 09/22/2021   Bilateral hand swelling 09/22/2021   Spondylolisthesis 05/06/2021   Uncontrolled type 2 diabetes mellitus with microalbuminuria 03/28/2021   Foot-drop 01/04/2021   Status post lumbar spinal fusion 02/12/2020   Spondylolisthesis of lumbar region 02/05/2020   Arthropathy of lumbar facet joint 12/11/2019   Chronic right-sided low back pain with right-sided sciatica 11/04/2019   Other chronic pain 11/04/2019   DDD (degenerative disc disease), lumbar 11/04/2019   Essential hypertension 11/04/2019   Body mass index (BMI) 50.0-59.9, adult (HCC) 11/04/2019   Spinal stenosis of lumbar region 11/04/2019   Spondylolisthesis, lumbar region 11/04/2019   Vitamin D  deficiency 09/10/2019   CHF (congestive heart failure) (HCC) 01/30/2018   OSA (obstructive sleep apnea) 03/01/2017   ARF (acute renal  failure) (HCC) 02/28/2017   Hyponatremia 02/28/2017   Syncope 07/23/2016   Syncope and collapse 07/23/2016   S/P cardiac catheterization, non obstructive disease 06/04/15 06/05/2015   CAD in native artery 06/05/2015   Fever 06/05/2015   Hypokalemia 06/05/2015   Metabolic syndrome 06/05/2015   Chest pain, neg MI, possible GI 06/04/2015   Colon cancer screening 08/05/2014   Severe obesity (BMI >= 40) (HCC) 08/05/2014   Health care maintenance 05/13/2014   Prediabetes 05/13/2014   Chronic migraine 04/27/2014   Atypical chest pain 04/27/2014    SOB (shortness of breath) 09/16/2013   Palpitations 09/16/2013   HTN (hypertension) 03/09/2012   Hyperlipidemia 03/09/2012   Microcytic anemia    Morbid obesity with BMI of 50.0-59.9, adult (HCC)    Precordial chest pain 03/06/2012   Dizziness 03/06/2012    ONSET DATE: 11/05/2023 (referral date), s/p L carpal tunnel release 10/08/23  REFERRING DIAG: Z98.890 (ICD-10-CM) - S/P carpal tunnel release   THERAPY DIAG:  Muscle weakness (generalized)  Scar condition and fibrosis of skin  Other lack of coordination  Other disturbances of skin sensation  Rationale for Evaluation and Treatment: Rehabilitation  SUBJECTIVE:   SUBJECTIVE STATEMENT: Pt reported no acute changes or updates. Pt reported strength and function of the L hand has improved. Pt reported continued "tingling" sensation in L thumb but not as noticeable: "it's getting better." Pt reported scar is a "little sensitive but way way better." Pt reported using silicone over scar site (note: provided at last OT session).  Pt accompanied by: self  PERTINENT HISTORY: s/p Lt carpal tunnel release, B carpal tunnel, s/p PLIF L2-3 and L3-4 7/22 due to chronic spinal stenosis with radiculopathy and neurogenic claudication. PMH includes L4-5 decompression and fusion, CAD, CHF, DM II, HLD, HTN, morbid obesity   Per 11/05/23 Referral Noes: Scar massage; s/p carpal tunnel release   PRECAUTIONS: Per [place]  Hand Protocol 5th Edition, Per 11/05/23 MD Progress Notes: "At this juncture, she can continue with activities as tolerated."   RED FLAGS: None   WEIGHT BEARING RESTRICTIONS: No  PAIN:  Are you having pain? No: "sore but not pain"  FALLS: Has patient fallen in last 6 months? Yes. Number of falls : No  LIVING ENVIRONMENT: Lives with: lives alone Lives in: House/apartment Stairs: Yes: External: 1 steps; grab bar Has following equipment at home: Single point cane, Walker - 2 wheeled, and shower chair  PLOF: Independent with  basic ADLs, IADLs, on disability  PATIENT GOALS: "I would like to be able to get the numbness to go away, to move it and do what I did before the carpal tunnel happened."  NEXT MD VISIT: Nov 27, 2023  OBJECTIVE:  Note: Objective measures were completed at Evaluation unless otherwise noted.  HAND DOMINANCE: Right  ADLs: Eating: ind Grooming: ind, difficulty with hair styling "but getting better" Upper body dressing: ind, difficulty with buttons Lower body dressing: ind, a little difficulty with donning pants d/t decreased pinch strength of LUE Bathing: currently sponge bathing to protect surgical site though plans to start soon with standard shower though noted "everything takes longer"   Playing tambourine in church - difficulty, has not attempted since the surgery  Laundry - family assists  Dishwashing - slower though ind  Handwriting - difficulty d/t numbness of RUE  FUNCTIONAL OUTCOME MEASURES: Quick Dash: 47.7% deficit      11/22/23 - 31.8% deficit   UPPER EXTREMITY ROM:     Active ROM Right eval Left eval  Shoulder flexion [facility]  Shoulder abduction    Shoulder adduction    Shoulder extension    Shoulder internal rotation    Shoulder external rotation    Elbow flexion    Elbow extension    Wrist flexion    Wrist extension    Wrist ulnar deviation    Wrist radial deviation    Wrist pronation    Wrist supination    (Blank rows = not tested)  Active ROM Right eval Left eval  Thumb MCP (0-60)    Thumb IP (0-80)    Thumb Radial abd/add (0-55)     Thumb Palmar abd/add (0-45)     Thumb Opposition to Small Finger     Index MCP (0-90)     Index PIP (0-100)     Index DIP (0-70)      Long MCP (0-90)      Long PIP (0-100)      Long DIP (0-70)      Ring MCP (0-90)      Ring PIP (0-100)      Ring DIP (0-70)      Little MCP (0-90)      Little PIP (0-100)      Little DIP (0-70)      (Blank rows = not tested)  LUE AROM digit flex/ext WFL though pt  unable to make tight grasp.  LUE AROM thumb flex slightly impaired compared to RUE. RUE AROM digit flex/ext WFL.   HAND FUNCTION: Grip strength: Right: 53.5, 51.8, 48.9 (51.4 lbs average) lbs; Left: 15.4 lbs  COORDINATION: 9 Hole Peg test: Right: 25 sec; Left: 21 sec  Pt reported RUE feels "weak" likely secondary to CTS symptoms.  SENSATION: LUE: mild symptoms of tingling at tip of thumb  RUE: moderate symptoms of tingling/numbness "whole hand"  Pt has splint for both hands but mainly only wears splint on R hand at night since surgery was completed on LUE.   EDEMA: mild edema of LUE  COGNITION: Overall cognitive status: Within functional limits for tasks assessed  OBSERVATIONS: Pt demo'd unsteady gait and wall-surfing strategy to maintain balance using single-tip cane with broken rubber cane tip. Pt reported feeling unsteady d/t cane is "slippery" on tile surface. OT provided pt with cane from clinic and pt demo'd improved steadiness of gait. Pt wore AFO on R foot.  OT assessed LUE scar site: Noted loose, dead skin at B edges of scar peeling back though scar ultimately healed well, new pink skin underneath dead skin, no s/s of infection. OT noted tightness of LUE scar. ?Presence of stitch   TODAY'S TREATMENT:                                                                                                                             Therapeutic Exercises: Pt placed BUE in Fluidotherapy machine with supervised ROM x 10 min. Pt was educated to complete BUE AROM tendon glides, wrist flex/ext, wrist circumduction during modality time to  improve ROM, provide sensory input to scar site, and decrease pain/stiffness of affected extremity by use of the machine's massaging action and thermal properties.    Wrist prayer stretch - 3 reps, 30-60 s holds - to decrease stiffness.  Self-Care OT reviewed joint protection and sensory safety precautions for BUE. Pt acknowledged understanding. OT  assessed progress towards some goals, see below.   Manual OT completed STM scar massage at incision site of affected hand - horizontal, vertical, zig-zag, circles - to decrease scar tissue, for scar management, to decrease tightness. In addition, OT educated pt on IASTM option and precautions, safety, and advised pt to avoid attempting to simulate IASTM at home with other metal objects. Pt acknowledged understanding and agreeable to IASTM in clinic today. OT completed light pressure IASTM with emulsifying lotion over LUE scar site along length of scar for approx. 10-15 second application for approx. 5 reps while providing pt with intermittent breaks to complete AROM wrist circumduction and flex/ext to decrease discomfort.  No adverse reaction observed during or after treatment.  The patient tolerated the STM and IASTM procedure well with pt reporting some sensitivity at distal scar site. The treatment focused on breaking up fascial adhesions, improving soft tissue mobility, and enhancing circulation to the affected areas, to promote improved increased mobility of L carpal tunnel/wrist.  Pt reported scar tissue "it doesn't feel as tight" after procedure. OT noted some decreased tightness of scar as well.  HEP update: OT educated pt on option to use Tennis ball on tabletop over scar site with L hand pronated - to decrease scar tissue, for scar management, to decrease tightness. Pt returned demo. OT provided pt with tennis ball.   OT reiterated education on Scar massage per previous handout provided and review of applying silicone pad over scar site at night.  Pt acknowledged understanding.  PATIENT EDUCATION: Education details: Scar massage and jt protecion Person educated: Patient Education method: Explanation, Demonstration, Tactile cues, and Verbal cues Education comprehension: verbalized understanding, returned demonstration, verbal cues required, tactile cues required, and needs further  education  HOME EXERCISE PROGRAM: 11/08/23 - scar massage (see pt instructions) 11/14/23 - Sleep positions, Tendon Glides and Intro to putty exercises 11/15/23 - joint protection (see pt instructions) 11/22/23 - scar massage with tennis ball on tabletop with Lt hand pronated (verbal instructions and demonstration)  GOALS: Goals reviewed with patient? Yes  SHORT TERM GOALS: Target date: 12/07/23  Pt will be ind with HEP using visual handouts. Baseline: new to outpt OT Goal status: IN Progress  2.  Pt will ind recall at least 3 joint protection strategies. Baseline: new to outpt OT 11/22/23 - Pt ind recalled being mindful of position of B hands, wearing wrist splint when washing dishes or cooking (carrying heavy items), and improved neutral positioning of wrist when sleeping. Goal status: MET  3.  Pt will return demo of scar massage and edema management techniques. Baseline: OT educated pt on scar massage, pt returned demo.  Goal status: IN Progress  4.  Pt will ind recall at least 3 sensory safety precautions Baseline: pt reported numbness/tingling symptoms of BUE, RUE > LUE 11/22/23 - Pt recalled hot, cold, heavy, sharp with min to mod v/c.  Goal status: IN Progress  LONG TERM GOALS: Target date: 01/04/24  Pt will report improved ability for "tight" grasp of LUE as needed for ADL/IADLs. Baseline: Pt reported difficulty with "tight" grasp of LUE. Goal status: IN Progress  2.  Patient will demonstrate at least 30  lbs LUE grip strength as needed to open jars and other containers.  Baseline: Grip strength: Right: 53.5, 51.8, 48.9 (51.4 lbs average) lbs; Left: 15.4 lbs Goal status: IN Progress  3.  Patient will demonstrate at least 16% improvement with quick Dash score (reporting 31.7% disability or less) indicating improved functional use of affected extremity.  Baseline: Quick Dash: 47.7% deficit 11/22/23 - 31.8% deficit Goal status: IN Progress  4.  Pt will demo and/or verbalize  understanding of A/E options and adaptive strategies as needed for ADL/IADLs. Baseline: pt reported difficulty with some ADL/IADL tasks, see objective measures above Goal status: IN Progress  ASSESSMENT:  CLINICAL IMPRESSION: Patient is a 63 y.o. female who was seen today for occupational therapy treatment for s/p L carpal tunnel release 10/08/23. Pt tolerated tasks well to address continued "tightness" of scar site through modalities and manual activities. Pt continues to demo more sensitivity over distal scar site compared to other areas of scar site though pt reporting improvements as evidenced by decreased sensitivity overall. Pt will benefit from continued skilled OT services in the outpatient setting to work on impairments as noted at evaluation to help pt return to [facility] as able.    PERFORMANCE DEFICITS: in functional skills including ADLs, IADLs, coordination, dexterity, proprioception, sensation, edema, ROM, strength, flexibility, Fine motor control, Gross motor control, mobility, balance, body mechanics, endurance, and UE functional use, cognitive skills including energy/drive, and psychosocial skills including environmental adaptation.   IMPAIRMENTS: are limiting patient from ADLs, IADLs, rest and sleep, leisure, and social participation.   COMORBIDITIES: may have co-morbidities  that affects occupational performance. Patient will benefit from skilled OT to address above impairments and improve overall function.  REHAB POTENTIAL: Good  PLAN:  OT FREQUENCY: 1x-2x/week (anticipate 2x per week initially, may decrease to 1x per week PRN)  OT DURATION: 6 weeks  PLANNED INTERVENTIONS: 97168 OT Re-evaluation, 97535 self care/ADL training, 82956 therapeutic exercise, 97530 therapeutic activity, 97112 neuromuscular re-education, 97140 manual therapy, 97035 ultrasound, 97018 paraffin, 21308 fluidotherapy, 97010 moist heat, 97010 cryotherapy, 97760 Orthotic Initial, 97761 Prosthetic Initial,  97763 Orthotic/Prosthetic subsequent, passive range of motion, functional mobility training, energy conservation, patient/family education, and DME and/or AE instructions  RECOMMENDED OTHER SERVICES: N/A  CONSULTED AND AGREED WITH PLAN OF CARE: Patient  PLAN FOR NEXT SESSION:  Per [place]  Hand Protocol 5th Edition  HEP: per [place]  Hand Protocol 5th Edition handouts Scar massage/management - cupping (how did it go?), IASTM (how did it go?) Fluidotherapy/cupping Wrist ROM A/E and adaptive strategies Review Sensory safety precautions and update STG #4 Updates from Dr. Merlinda Starling appointment on 5/13?  Oakley Bellman, OT 11/22/2023, 4:03 PM

## 2023-11-26 ENCOUNTER — Ambulatory Visit: Admitting: Occupational Therapy

## 2023-11-26 DIAGNOSIS — L905 Scar conditions and fibrosis of skin: Secondary | ICD-10-CM

## 2023-11-26 DIAGNOSIS — M6281 Muscle weakness (generalized): Secondary | ICD-10-CM | POA: Diagnosis not present

## 2023-11-26 DIAGNOSIS — R208 Other disturbances of skin sensation: Secondary | ICD-10-CM

## 2023-11-26 DIAGNOSIS — R278 Other lack of coordination: Secondary | ICD-10-CM

## 2023-11-26 NOTE — Therapy (Signed)
 OUTPATIENT OCCUPATIONAL THERAPY ORTHO TREATMENT  Patient Name: Hannah Huerta MRN: [id number] DOB:[birth year], 63 y.o., female Today's Date: 11/26/2023  PCP: Rudine Cos  REFERRING PROVIDER: Merrill Abide, MD   END OF SESSION:  OT End of Session - 11/26/23 1309     Visit Number 6    Number of Visits 13   including eval   Date for OT Re-Evaluation 01/04/24    Authorization Type U[employer] Dual 2025, Covered 100%, VL: [place]    OT Start Time 0849    OT Stop Time 0928    OT Time Calculation (min) 39 min    Activity Tolerance Patient tolerated treatment well    Behavior During Therapy WFL for tasks assessed/performed                Past Medical History:  Diagnosis Date   Abnormal Pap smear of cervix    pt couldnt state what type of abnormality   Anemia    Arthritis    Carpal tunnel syndrome, bilateral    CHF (congestive heart failure) (HCC)    Chronic migraine    Diabetes mellitus without complication (HCC)    GERD (gastroesophageal reflux disease)    HLD (hyperlipidemia)    Hypertension    Hypokalemia 06/05/2015   Metabolic syndrome 06/05/2015   Morbid obesity with BMI of 50.0-59.9, adult (HCC)    Prediabetes    S/P cardiac catheterization, non obstructive disease 06/04/15 06/05/2015   Sickle cell trait (HCC)    Spondylolisthesis of lumbar region    Syncope    Past Surgical History:  Procedure Laterality Date   BACK SURGERY     x1 lumbar fusion   CARDIAC CATHETERIZATION N/A 06/04/2015   Procedure: Left Heart Cath and Coronary Angiography;  Surgeon: Arty Binning, MD;  Location: [facility] INVASIVE CV LAB;  Service: Cardiovascular;  Laterality: N/A;   CHOLECYSTECTOMY     laparoscopic   COLONOSCOPY WITH PROPOFOL  N/A 09/04/2014   Procedure: COLONOSCOPY WITH PROPOFOL ;  Surgeon: Kenney Peacemaker, MD;  Location: [facility]OS[facility];  Service: Endoscopy;  Laterality: N/A;   LEFT HEART CATHETERIZATION WITH CORONARY ANGIOGRAM N/A 03/08/2012   Procedure: LEFT HEART CATHETERIZATION WITH  CORONARY ANGIOGRAM;  Surgeon: Kristopher Pheasant, MD;  Location: [facility] CATH LAB;  Service: Cardiovascular;  Laterality: N/A;   SPINE SURGERY     fusion   TUBAL LIGATION     Patient Active Problem List   Diagnosis Date Noted   Pain in joint of right hip 04/12/2023   Cervical cancer screening 12/21/2022   Type 2 diabetes mellitus without complication, without long-term current use of insulin  (HCC) 08/04/2022   Medial orbital wall fracture, closed, initial encounter (HCC) 10/03/2021   Bilateral carpal tunnel syndrome 09/22/2021   Rheumatoid factor positive 09/22/2021   Bilateral hand swelling 09/22/2021   Spondylolisthesis 05/06/2021   Uncontrolled type 2 diabetes mellitus with microalbuminuria 03/28/2021   Foot-drop 01/04/2021   Status post lumbar spinal fusion 02/12/2020   Spondylolisthesis of lumbar region 02/05/2020   Arthropathy of lumbar facet joint 12/11/2019   Chronic right-sided low back pain with right-sided sciatica 11/04/2019   Other chronic pain 11/04/2019   DDD (degenerative disc disease), lumbar 11/04/2019   Essential hypertension 11/04/2019   Body mass index (BMI) 50.0-59.9, adult (HCC) 11/04/2019   Spinal stenosis of lumbar region 11/04/2019   Spondylolisthesis, lumbar region 11/04/2019   Vitamin D  deficiency 09/10/2019   CHF (congestive heart failure) (HCC) 01/30/2018   OSA (obstructive sleep apnea) 03/01/2017   ARF (acute  renal failure) (HCC) 02/28/2017   Hyponatremia 02/28/2017   Syncope 07/23/2016   Syncope and collapse 07/23/2016   S/P cardiac catheterization, non obstructive disease 06/04/15 06/05/2015   CAD in native artery 06/05/2015   Fever 06/05/2015   Hypokalemia 06/05/2015   Metabolic syndrome 06/05/2015   Chest pain, neg MI, possible GI 06/04/2015   Colon cancer screening 08/05/2014   Severe obesity (BMI >= 40) (HCC) 08/05/2014   Health care maintenance 05/13/2014   Prediabetes 05/13/2014   Chronic migraine 04/27/2014   Atypical chest pain 04/27/2014    SOB (shortness of breath) 09/16/2013   Palpitations 09/16/2013   HTN (hypertension) 03/09/2012   Hyperlipidemia 03/09/2012   Microcytic anemia    Morbid obesity with BMI of 50.0-59.9, adult (HCC)    Precordial chest pain 03/06/2012   Dizziness 03/06/2012    ONSET DATE: 11/05/2023 (referral date), s/p L carpal tunnel release 10/08/23  REFERRING DIAG: Z98.890 (ICD-10-CM) - S/P carpal tunnel release   THERAPY DIAG:  Muscle weakness (generalized)  Scar condition and fibrosis of skin  Other lack of coordination  Other disturbances of skin sensation  Rationale for Evaluation and Treatment: Rehabilitation  SUBJECTIVE:   SUBJECTIVE STATEMENT: Pt reported completing scar massage 2-3 times per day. Pt reported scar site "sore" and pointed out distal end of scar site. Pt questioning if scar site is "aggravated." Pt denied tingling/numbness of L hand except in thumb of LUE.   Pt accompanied by: self  PERTINENT HISTORY: s/p Lt carpal tunnel release, B carpal tunnel, s/p PLIF L2-3 and L3-4 7/22 due to chronic spinal stenosis with radiculopathy and neurogenic claudication. PMH includes L4-5 decompression and fusion, CAD, CHF, DM II, HLD, HTN, morbid obesity   Per 11/05/23 Referral Noes: Scar massage; s/p carpal tunnel release   PRECAUTIONS: Per [place]  Hand Protocol 5th Edition, Per 11/05/23 MD Progress Notes: "At this juncture, she can continue with activities as tolerated."   RED FLAGS: None   WEIGHT BEARING RESTRICTIONS: No  PAIN:  Are you having pain? 3/10 pain "soreness" (at the "top" (distal end) of scar)  FALLS: Has patient fallen in last 6 months? Yes. Number of falls : No  LIVING ENVIRONMENT: Lives with: lives alone Lives in: House/apartment Stairs: Yes: External: 1 steps; grab bar Has following equipment at home: Single point cane, Walker - 2 wheeled, and shower chair  PLOF: Independent with basic ADLs, IADLs, on disability  PATIENT GOALS: "I would like to be able  to get the numbness to go away, to move it and do what I did before the carpal tunnel happened."  NEXT MD VISIT: Nov 27, 2023  OBJECTIVE:  Note: Objective measures were completed at Evaluation unless otherwise noted.  HAND DOMINANCE: Right  ADLs: Eating: ind Grooming: ind, difficulty with hair styling "but getting better" Upper body dressing: ind, difficulty with buttons Lower body dressing: ind, a little difficulty with donning pants d/t decreased pinch strength of LUE Bathing: currently sponge bathing to protect surgical site though plans to start soon with standard shower though noted "everything takes longer"   Playing tambourine in church - difficulty, has not attempted since the surgery  Laundry - family assists  Dishwashing - slower though ind  Handwriting - difficulty d/t numbness of RUE  FUNCTIONAL OUTCOME MEASURES: Quick Dash: 47.7% deficit      11/22/23 - 31.8% deficit   UPPER EXTREMITY ROM:     Active ROM Right eval Left eval  Shoulder flexion [facility]  Shoulder abduction    Shoulder adduction  Shoulder extension    Shoulder internal rotation    Shoulder external rotation    Elbow flexion    Elbow extension    Wrist flexion    Wrist extension    Wrist ulnar deviation    Wrist radial deviation    Wrist pronation    Wrist supination    (Blank rows = not tested)  Active ROM Right eval Left eval  Thumb MCP (0-60)    Thumb IP (0-80)    Thumb Radial abd/add (0-55)     Thumb Palmar abd/add (0-45)     Thumb Opposition to Small Finger     Index MCP (0-90)     Index PIP (0-100)     Index DIP (0-70)      Long MCP (0-90)      Long PIP (0-100)      Long DIP (0-70)      Ring MCP (0-90)      Ring PIP (0-100)      Ring DIP (0-70)      Little MCP (0-90)      Little PIP (0-100)      Little DIP (0-70)      (Blank rows = not tested)  LUE AROM digit flex/ext WFL though pt unable to make tight grasp.  LUE AROM thumb flex slightly impaired compared to  RUE. RUE AROM digit flex/ext WFL.   HAND FUNCTION: Grip strength: Right: 53.5, 51.8, 48.9 (51.4 lbs average) lbs; Left: 15.4 lbs  COORDINATION: 9 Hole Peg test: Right: 25 sec; Left: 21 sec  Pt reported RUE feels "weak" likely secondary to CTS symptoms.  SENSATION: LUE: mild symptoms of tingling at tip of thumb  RUE: moderate symptoms of tingling/numbness "whole hand"  Pt has splint for both hands but mainly only wears splint on R hand at night since surgery was completed on LUE.   EDEMA: mild edema of LUE  COGNITION: Overall cognitive status: Within functional limits for tasks assessed  OBSERVATIONS: Pt demo'd unsteady gait and wall-surfing strategy to maintain balance using single-tip cane with broken rubber cane tip. Pt reported feeling unsteady d/t cane is "slippery" on tile surface. OT provided pt with cane from clinic and pt demo'd improved steadiness of gait. Pt wore AFO on R foot.  OT assessed LUE scar site: Noted loose, dead skin at B edges of scar peeling back though scar ultimately healed well, new pink skin underneath dead skin, no s/s of infection. OT noted tightness of LUE scar. ?Presence of stitch   TODAY'S TREATMENT:                                                                                                                             Therapeutic Exercises: Pt placed LUE in Fluidotherapy machine with supervised ROM x 10 min. Pt was educated to complete LUE AROM tendon glides, wrist flex/ext, wrist circumduction during modality time to improve ROM, provide sensory input to scar site, and decrease pain/stiffness of  affected extremity by use of the machine's massaging action and thermal properties.    Wrist flex/ext with 2 lb dumbbell - 10 reps, 2 sets - to improve LUE strength and gross motor coordination. Pt reported no increase in pain.  TherAct OT assessed pt's progress towards goals, see below for updates.   OT assessed scar site and noted pt's scar had  decreased tightness compared to previous session though continued overall tightness, especially at distal end of scar. OT noted "knot" at distal end of scar likely d/t scar tissue. Pt reporting some soreness at scar site and therefore OT did not complete scar massage today to avoid increase in pain at scar site. OT recommended to pt to continue to complete scar massage at home, using tennis ball at least 1x per day. Pt acknowledged understanding.   OT recommended to pt to ask Dr Merlinda Starling about site of sensitivity at distal edge of scar and ongoing LUE thumb tingling symptoms. Pt verbalized understanding.  Placing/removing pegs on grooved pegboard - to improve FM coordination and dexterity, in-hand manipulation with LUE.  Wrist maze - to improve LUE coordination, ROM.   Self-Care OT educated pt on joint protection and option for replaceable mop pads and other mop options (e.g. Swiffer) to avoid "wringing" repetitive motion for traditional/standard mop as needed for joint protection. Pt verbalized understanding.   OT educated pt on UE anatomy, scar tissue, scar remodeling and management, healing process, prognosis, presence of long nails and potential effect on coordination. Pt verbalized understanding.   A/E options for lifting heavy objects with LUE (I.e. groceries) - e.g. rolling cart. OT showed examples of rolling cart online. Pt verbalized understanding.  PATIENT EDUCATION: Education details: see today's tx above Person educated: Patient Education method: Explanation, Demonstration, Tactile cues, and Verbal cues Education comprehension: verbalized understanding, returned demonstration, verbal cues required, tactile cues required, and needs further education  HOME EXERCISE PROGRAM: 11/08/23 - scar massage (see pt instructions) 11/14/23 - Sleep positions, Tendon Glides and Intro to putty exercises 11/15/23 - joint protection (see pt instructions) 11/22/23 - scar massage with tennis ball on tabletop  with Lt hand pronated (verbal instructions and demonstration)  GOALS: Goals reviewed with patient? Yes  SHORT TERM GOALS: Target date: 12/07/23  Pt will be ind with HEP using visual handouts. Baseline: new to outpt OT Goal status: IN Progress  2.  Pt will ind recall at least 3 joint protection strategies. Baseline: new to outpt OT 11/22/23 - Pt ind recalled being mindful of position of B hands, wearing wrist splint when washing dishes or cooking (carrying heavy items), and improved neutral positioning of wrist when sleeping. Goal status: MET  3.  Pt will return demo of scar massage and edema management techniques. Baseline: OT educated pt on scar massage, pt returned demo.  11/26/23 - Pt reported completing scar massage 2-3x per day and using tennis ball at scar site 1x per day. Pt reported using silicone patch for scar remodeling at night.  Goal status: IN Progress  4.  Pt will ind recall at least 3 sensory safety precautions Baseline: pt reported numbness/tingling symptoms of BUE, RUE > LUE 11/22/23 - Pt recalled hot, cold, heavy, sharp with min to mod v/c.  11/26/23 - Pt ind recalled sharp, hot, and cold. Goal status: MET  LONG TERM GOALS: Target date: 01/04/24  Pt will report improved ability for "tight" grasp of LUE as needed for ADL/IADLs. Baseline: Pt reported difficulty with "tight" grasp of LUE. 11/26/23 - Pt reported grip  is getting stronger with LUE. Pt reported improved ability to open jars/containers. Goal status: MET  2.  Patient will demonstrate at least 30 lbs LUE grip strength as needed to open jars and other containers.  Baseline: Grip strength: Right: 53.5, 51.8, 48.9 (51.4 lbs average) lbs; Left: 15.4 lbs 11/26/23 - Left: 27.7, 36.3, 34.8 (32.9 lbs average) Goal status: MET  3.  Patient will demonstrate at least 16% improvement with quick Dash score (reporting 31.7% disability or less) indicating improved functional use of affected extremity.  Baseline: Quick Dash:  47.7% deficit 11/22/23 - 31.8% deficit Goal status: IN Progress  4.  Pt will demo and/or verbalize understanding of A/E options and adaptive strategies as needed for ADL/IADLs. Baseline: pt reported difficulty with some ADL/IADL tasks, see objective measures above 11/26/23 - Pt reported improved participation in ADLs/IADLs though continued difficulty with lifting heavy objects and difficulty with wringing mop.  Goal status: IN Progress  ASSESSMENT:  CLINICAL IMPRESSION: Patient is a 63 y.o. female who was seen today for occupational therapy treatment for s/p L carpal tunnel release 10/08/23. Pt met 1 STG and LTG today. Pt demo'd increased grip strength LUE and reported improved functional use of affected UE. Pt tolerated tasks well though continuing to report "soreness" and sensitivity at distal end of scar site. Pt has f/u with surgeon Dr. Merlinda Starling tomorrow. Pt will benefit from continued skilled OT services in the outpatient setting to work on impairments as noted at evaluation to help pt return to [facility] as able.    PERFORMANCE DEFICITS: in functional skills including ADLs, IADLs, coordination, dexterity, proprioception, sensation, edema, ROM, strength, flexibility, Fine motor control, Gross motor control, mobility, balance, body mechanics, endurance, and UE functional use, cognitive skills including energy/drive, and psychosocial skills including environmental adaptation.   IMPAIRMENTS: are limiting patient from ADLs, IADLs, rest and sleep, leisure, and social participation.   COMORBIDITIES: may have co-morbidities  that affects occupational performance. Patient will benefit from skilled OT to address above impairments and improve overall function.  REHAB POTENTIAL: Good  PLAN:  OT FREQUENCY: 1x-2x/week (anticipate 2x per week initially, may decrease to 1x per week PRN)  OT DURATION: 6 weeks  PLANNED INTERVENTIONS: 97168 OT Re-evaluation, 97535 self care/ADL training, 11914 therapeutic exercise,  97530 therapeutic activity, 97112 neuromuscular re-education, 97140 manual therapy, 97035 ultrasound, 97018 paraffin, 78295 fluidotherapy, 97010 moist heat, 97010 cryotherapy, 97760 Orthotic Initial, 97761 Prosthetic Initial, 97763 Orthotic/Prosthetic subsequent, passive range of motion, functional mobility training, energy conservation, patient/family education, and DME and/or AE instructions  RECOMMENDED OTHER SERVICES: N/A  CONSULTED AND AGREED WITH PLAN OF CARE: Patient  PLAN FOR NEXT SESSION:  Per [place]  Hand Protocol 5th Edition  HEP: per [place]  Hand Protocol 5th Edition handouts Scar massage/management - cupping (how did it go?), IASTM (how did it go?) Fluidotherapy Wrist ROM A/E and adaptive strategies for functional tasks Updates from Dr. Merlinda Starling appointment on 5/13?  Oakley Bellman, OT 11/26/2023, 1:17 PM

## 2023-11-27 ENCOUNTER — Ambulatory Visit (INDEPENDENT_AMBULATORY_CARE_PROVIDER_SITE_OTHER): Admitting: Orthopedic Surgery

## 2023-11-27 DIAGNOSIS — G5603 Carpal tunnel syndrome, bilateral upper limbs: Secondary | ICD-10-CM

## 2023-11-27 DIAGNOSIS — G5601 Carpal tunnel syndrome, right upper limb: Secondary | ICD-10-CM

## 2023-11-27 NOTE — Progress Notes (Signed)
   Hannah Huerta - 63 y.o. female MRN [id number]  Date of birth: 07/11/1961  Office Visit Note: Visit Date: 11/27/2023 PCP: Jerrlyn Morel, NP Referred by: Jerrlyn Morel, NP  Subjective:  HPI: Hannah Huerta is a 63 y.o. female who presents today for follow up 6 weeks status post left wrist open carpal tunnel release.  She is doing very well overall, is progressing nicely with activities with the left hand.  She is interested in moving forward with surgical scheduling of the right carpal tunnel release as previously discussed.  Pertinent ROS were reviewed with the patient and found to be negative unless otherwise specified above in HPI.   Assessment & Plan: Visit Diagnoses:  1. Bilateral carpal tunnel syndrome     Plan: Given that she has progressed very nicely status post left open carpal tunnel release, we can move forward with surgical scheduling of right open carpal tunnel release.  She does have ongoing clinical and electrodiagnostic evidence of right-sided carpal tunnel syndrome that is refractory to conservative care.  Risks and benefits of the procedure were discussed, risks including but not limited to infection, bleeding, scarring, stiffness, nerve injury, tendon injury, vascular injury, recurrence of symptoms and need for subsequent operation.  We also discussed the appropriate postoperative protocol and timeframe for return to activities and function.  Patient expressed understanding.  She did well with local anesthesia for the left side and would like the same for the right.  Will move forward with surgical scheduling of right open carpal tunnel release with local anesthesia at the next available date per patient preference.    Follow-up: No follow-ups on file.   Meds & Orders: No orders of the defined types were placed in this encounter.  No orders of the defined types were placed in this encounter.    Procedures: No procedures performed       Objective:   Vital  Signs: LMP 09/15/2013   Ortho Exam Left hand: - Well-healed palmar incision - Composite fist without restriction - Sensation intact to light touch in the median nerve distribution - 4+/5 APB mild thenar atrophy  Right hand: Sensation is diminished to light touch in the bilateral median nerve distribution.    Thenar atrophy: Mild  Tinel sign: Positive  Carpal tunnel compression: Positive  Phalen test: Positive    Sensory hand 2-point discrimination (thumb, index, middle): 7-8 mm    Motor hand APB: 4/5    Fingers pink and well perfused.  Capillary refill is brisk.    Imaging: No results found.   Yashua Bracco Alvia Jointer, M.D. [facility], Hand Surgery

## 2023-11-28 ENCOUNTER — Ambulatory Visit: Admitting: Occupational Therapy

## 2023-11-28 DIAGNOSIS — R29818 Other symptoms and signs involving the nervous system: Secondary | ICD-10-CM

## 2023-11-28 DIAGNOSIS — M6281 Muscle weakness (generalized): Secondary | ICD-10-CM

## 2023-11-28 DIAGNOSIS — R278 Other lack of coordination: Secondary | ICD-10-CM

## 2023-11-28 DIAGNOSIS — L905 Scar conditions and fibrosis of skin: Secondary | ICD-10-CM

## 2023-11-28 DIAGNOSIS — R208 Other disturbances of skin sensation: Secondary | ICD-10-CM

## 2023-11-28 NOTE — Therapy (Signed)
 OUTPATIENT OCCUPATIONAL THERAPY ORTHO TREATMENT  Patient Name: Hannah Huerta MRN: [id number] DOB:09-30-1960, 63 y.o., female Today's Date: 11/28/2023  PCP: Rudine Cos  REFERRING PROVIDER: Merrill Abide, MD   END OF SESSION:  OT End of Session - 11/28/23 0934     Visit Number 7    Number of Visits 13   including eval   Date for OT Re-Evaluation 01/04/24    Authorization Type UHC Dual 2025, Covered 100%, VL: [place]    OT Start Time 0933    OT Stop Time 1015    OT Time Calculation (min) 42 min    Equipment Utilized During Treatment Cups, Pink Putty    Activity Tolerance Patient tolerated treatment well    Behavior During Therapy WFL for tasks assessed/performed                Past Medical History:  Diagnosis Date   Abnormal Pap smear of cervix    pt couldnt state what type of abnormality   Anemia    Arthritis    Carpal tunnel syndrome, bilateral    CHF (congestive heart failure) (HCC)    Chronic migraine    Diabetes mellitus without complication (HCC)    GERD (gastroesophageal reflux disease)    HLD (hyperlipidemia)    Hypertension    Hypokalemia 06/05/2015   Metabolic syndrome 06/05/2015   Morbid obesity with BMI of 50.0-59.9, adult (HCC)    Prediabetes    S/P cardiac catheterization, non obstructive disease 06/04/15 06/05/2015   Sickle cell trait (HCC)    Spondylolisthesis of lumbar region    Syncope    Past Surgical History:  Procedure Laterality Date   BACK SURGERY     x1 lumbar fusion   CARDIAC CATHETERIZATION N/A 06/04/2015   Procedure: Left Heart Cath and Coronary Angiography;  Surgeon: Arty Binning, MD;  Location: [facility] INVASIVE CV LAB;  Service: Cardiovascular;  Laterality: N/A;   CHOLECYSTECTOMY     laparoscopic   COLONOSCOPY WITH PROPOFOL  N/A 09/04/2014   Procedure: COLONOSCOPY WITH PROPOFOL ;  Surgeon: Kenney Peacemaker, MD;  Location: [facility] ENDOSCOPY;  Service: Endoscopy;  Laterality: N/A;   LEFT HEART CATHETERIZATION WITH CORONARY ANGIOGRAM  N/A 03/08/2012   Procedure: LEFT HEART CATHETERIZATION WITH CORONARY ANGIOGRAM;  Surgeon: Kristopher Pheasant, MD;  Location: [facility] CATH LAB;  Service: Cardiovascular;  Laterality: N/A;   SPINE SURGERY     fusion   TUBAL LIGATION     Patient Active Problem List   Diagnosis Date Noted   Pain in joint of right hip 04/12/2023   Cervical cancer screening 12/21/2022   Type 2 diabetes mellitus without complication, without long-term current use of insulin  (HCC) 08/04/2022   Medial orbital wall fracture, closed, initial encounter (HCC) 10/03/2021   Bilateral carpal tunnel syndrome 09/22/2021   Rheumatoid factor positive 09/22/2021   Bilateral hand swelling 09/22/2021   Spondylolisthesis 05/06/2021   Uncontrolled type 2 diabetes mellitus with microalbuminuria 03/28/2021   Foot-drop 01/04/2021   Status post lumbar spinal fusion 02/12/2020   Spondylolisthesis of lumbar region 02/05/2020   Arthropathy of lumbar facet joint 12/11/2019   Chronic right-sided low back pain with right-sided sciatica 11/04/2019   Other chronic pain 11/04/2019   DDD (degenerative disc disease), lumbar 11/04/2019   Essential hypertension 11/04/2019   Body mass index (BMI) 50.0-59.9, adult (HCC) 11/04/2019   Spinal stenosis of lumbar region 11/04/2019   Spondylolisthesis, lumbar region 11/04/2019   Vitamin D  deficiency 09/10/2019   CHF (congestive heart failure) (HCC) 01/30/2018  OSA (obstructive sleep apnea) 03/01/2017   ARF (acute renal failure) (HCC) 02/28/2017   Hyponatremia 02/28/2017   Syncope 07/23/2016   Syncope and collapse 07/23/2016   S/P cardiac catheterization, non obstructive disease 06/04/15 06/05/2015   CAD in native artery 06/05/2015   Fever 06/05/2015   Hypokalemia 06/05/2015   Metabolic syndrome 06/05/2015   Chest pain, neg MI, possible GI 06/04/2015   Colon cancer screening 08/05/2014   Severe obesity (BMI >= 40) (HCC) 08/05/2014   Health care maintenance 05/13/2014   Prediabetes 05/13/2014    Chronic migraine 04/27/2014   Atypical chest pain 04/27/2014   SOB (shortness of breath) 09/16/2013   Palpitations 09/16/2013   HTN (hypertension) 03/09/2012   Hyperlipidemia 03/09/2012   Microcytic anemia    Morbid obesity with BMI of 50.0-59.9, adult (HCC)    Precordial chest pain 03/06/2012   Dizziness 03/06/2012    ONSET DATE: 11/05/2023 (referral date), s/p L carpal tunnel release 10/08/23  REFERRING DIAG: Z98.890 (ICD-10-CM) - S/P carpal tunnel release   THERAPY DIAG:  Muscle weakness (generalized)  Other lack of coordination  Scar condition and fibrosis of skin  Other disturbances of skin sensation  Other symptoms and signs involving the nervous system  Rationale for Evaluation and Treatment: Rehabilitation  SUBJECTIVE:   SUBJECTIVE STATEMENT: Pt saw the doctor yesterday - MD note: Given that she has progressed very nicely status post left open carpal tunnel release, we can move forward with surgical scheduling of right open carpal tunnel release. She does have ongoing clinical and electrodiagnostic evidence of right-sided carpal tunnel syndrome that is refractory to conservative care.   R UE CTS surgery is scheduled for May 29th.  Pt accompanied by: self  PERTINENT HISTORY: s/p Lt carpal tunnel release, B carpal tunnel, s/p PLIF L2-3 and L3-4 7/22 due to chronic spinal stenosis with radiculopathy and neurogenic claudication. PMH includes L4-5 decompression and fusion, CAD, CHF, DM II, HLD, HTN, morbid obesity   Per 11/05/23 Referral Noes: Scar massage; s/p carpal tunnel release   PRECAUTIONS: Per [place]  Hand Protocol 5th Edition, Per 11/05/23 MD Progress Notes: "At this juncture, she can continue with activities as tolerated."   RED FLAGS: None   WEIGHT BEARING RESTRICTIONS: No  PAIN:  Are you having pain? No pain today  FALLS: Has patient fallen in last 6 months? Yes. Number of falls : No  LIVING ENVIRONMENT: Lives with: lives alone Lives in:  House/apartment Stairs: Yes: External: 1 steps; grab bar Has following equipment at home: Single point cane, Walker - 2 wheeled, and shower chair  PLOF: Independent with basic ADLs, IADLs, on disability  PATIENT GOALS: "I would like to be able to get the numbness to go away, to move it and do what I did before the carpal tunnel happened."  NEXT MD VISIT: May 29 - Right Carpal Tunnel Surgery  OBJECTIVE:  Note: Objective measures were completed at Evaluation unless otherwise noted.  HAND DOMINANCE: Right  ADLs: Eating: ind Grooming: ind, difficulty with hair styling "but getting better" Upper body dressing: ind, difficulty with buttons Lower body dressing: ind, a little difficulty with donning pants d/t decreased pinch strength of LUE Bathing: currently sponge bathing to protect surgical site though plans to start soon with standard shower though noted "everything takes longer"   Playing tambourine in church - difficulty, has not attempted since the surgery  Laundry - family assists  Dishwashing - slower though ind  Handwriting - difficulty d/t numbness of RUE  FUNCTIONAL OUTCOME MEASURES: Quick Dash: 47.7%  deficit      11/22/23 - 31.8% deficit   UPPER EXTREMITY ROM:     Active ROM Right eval Left eval  Shoulder flexion [facility]  Shoulder abduction    Shoulder adduction    Shoulder extension    Shoulder internal rotation    Shoulder external rotation    Elbow flexion    Elbow extension    Wrist flexion    Wrist extension    Wrist ulnar deviation    Wrist radial deviation    Wrist pronation    Wrist supination    (Blank rows = not tested)  Active ROM Right eval Left eval  Thumb MCP (0-60)    Thumb IP (0-80)    Thumb Radial abd/add (0-55)     Thumb Palmar abd/add (0-45)     Thumb Opposition to Small Finger     Index MCP (0-90)     Index PIP (0-100)     Index DIP (0-70)      Long MCP (0-90)      Long PIP (0-100)      Long DIP (0-70)      Ring MCP  (0-90)      Ring PIP (0-100)      Ring DIP (0-70)      Little MCP (0-90)      Little PIP (0-100)      Little DIP (0-70)      (Blank rows = not tested)  LUE AROM digit flex/ext WFL though pt unable to make tight grasp.  LUE AROM thumb flex slightly impaired compared to RUE. RUE AROM digit flex/ext WFL.   HAND FUNCTION: Grip strength: Right: 53.5, 51.8, 48.9 (51.4 lbs average) lbs; Left: 15.4 lbs  COORDINATION: 9 Hole Peg test: Right: 25 sec; Left: 21 sec  Pt reported RUE feels "weak" likely secondary to CTS symptoms.  SENSATION: LUE: mild symptoms of tingling at tip of thumb  RUE: moderate symptoms of tingling/numbness "whole hand"  Pt has splint for both hands but mainly only wears splint on R hand at night since surgery was completed on LUE.   EDEMA: mild edema of LUE  COGNITION: Overall cognitive status: Within functional limits for tasks assessed  OBSERVATIONS: Pt demo'd unsteady gait and wall-surfing strategy to maintain balance using single-tip cane with broken rubber cane tip. Pt reported feeling unsteady d/t cane is "slippery" on tile surface. OT provided pt with cane from clinic and pt demo'd improved steadiness of gait. Pt wore AFO on R foot.  OT assessed LUE scar site: Noted loose, dead skin at B edges of scar peeling back though scar ultimately healed well, new pink skin underneath dead skin, no s/s of infection. OT noted tightness of LUE scar. ?Presence of stitch   TODAY'S TREATMENT:                                                                                                                             Manual Techniques:  Therapist provided manual techniques and cupping techniques during continued scar massage at healed site of incision.  Techniques used for reduction of scar tissue, to promote improved AROM and pain reduction/sensitivity of affected surgical site. Cupping therapy was applied to the scar area using sliding motions to reduce tension, and promote  the breakdown of scar tissue adhesions.  Gentle, light suction was used initially to assess the client's comfort level, gradually increasing suction as tolerated.  The cups were moved in a gentle, circular motion over the scar tissue to aid in the mobilization of the tissue and enhance blood flow.  The client was instructed to provide feedback throughout the session to monitor their comfort level.  Improved appearance and palpation of incision noted upon completion with less palpable adhesions and therefore improved ROM.  Pt provided continued instruction re: massaging scar in three directions: circles, side to side, and up and down.  TherAct Reviewed and completed Putty Exercise education pink putty to progress strengthening, coordination and sensory stimulation of L UEs.  Patient provided visual demonstration, verbal and tactile cues as needed to improve performance of the various exercises/activities including:   - Putty Squeezes - cues to squeeze putty into log for use with other exercises and to fold putty in half with 1 hand  - Putty Rolls - encourage to roll putty into logs with sensory stimulation to entire length of hand, fingers and wrist as needed   - Pinch and Pull with Putty - this motion is combined with different pinches (3-Point Pinch, Tip Pinch, Key Pinch) - patient encouraged to combine tripod, pincer and/or key pinch with "pinch and pull" motion of putty pulling away from midline, changing between different pinches and changing different directions to change grip  - Finger Extension with Putty - pt shown how to work on task with all fingers and thumb as well as individual fingers in opposition to thumb  - Finger Adduction with Putty - pt shown how to work on weaving putty between fingers/thumb and then squeeze fingers together while laying hand flat on table top.  - Removing Objects from Putty  - encouraged to hide items (coins, marble, dice etc) and use one hand at a time to find  the objects and identify them by tactile input before she digs them out and can see them visually.  OT educated patient on theraputty recommendations: avoid hot environments, place in designated container, avoid contact with fabrics. Patient verbalized understanding.    Patient benefited from extra time, verbal/tactile cues, and modeling of task to allow time for processing of verbal instructions and improve motor planning of unfamiliar movements.   PATIENT EDUCATION: Education details: Putty Activities Person educated: Patient Education method: Explanation, Demonstration, Tactile cues, and Verbal cues Education comprehension: verbalized understanding, returned demonstration, verbal cues required, tactile cues required, and needs further education  HOME EXERCISE PROGRAM: 11/08/23 - scar massage (see pt instructions) 11/14/23 - Sleep positions, Tendon Glides and Intro to putty exercises 11/15/23 - joint protection (see pt instructions) 11/22/23 - scar massage with tennis ball on tabletop with Lt hand pronated (verbal instructions and demonstration) 11/28/23 - complete Putty activities Access Code: [email or web address]   GOALS: Goals reviewed with patient? Yes  SHORT TERM GOALS: Target date: 12/07/23  Pt will be ind with HEP using visual handouts. Baseline: new to outpt OT Goal status: IN Progress  2.  Pt will ind recall at least 3 joint protection strategies. Baseline: new to outpt OT 11/22/23 - Pt ind recalled being mindful of position  of B hands, wearing wrist splint when washing dishes or cooking (carrying heavy items), and improved neutral positioning of wrist when sleeping. Goal status: MET  3.  Pt will return demo of scar massage and edema management techniques. Baseline: OT educated pt on scar massage, pt returned demo.  11/26/23 - Pt reported completing scar massage 2-3x per day and using tennis ball at scar site 1x per day. Pt reported using silicone patch for scar remodeling at night.  Goal  status: IN Progress  4.  Pt will ind recall at least 3 sensory safety precautions Baseline: pt reported numbness/tingling symptoms of BUE, RUE > LUE 11/22/23 - Pt recalled hot, cold, heavy, sharp with min to mod v/c.  11/26/23 - Pt ind recalled sharp, hot, and cold. Goal status: MET  LONG TERM GOALS: Target date: 01/04/24  Pt will report improved ability for "tight" grasp of LUE as needed for ADL/IADLs. Baseline: Pt reported difficulty with "tight" grasp of LUE. 11/26/23 - Pt reported grip is getting stronger with LUE. Pt reported improved ability to open jars/containers. Goal status: MET  2.  Patient will demonstrate at least 30 lbs LUE grip strength as needed to open jars and other containers.  Baseline: Grip strength: Right: 53.5, 51.8, 48.9 (51.4 lbs average) lbs; Left: 15.4 lbs 11/26/23 - Left: 27.7, 36.3, 34.8 (32.9 lbs average) Goal status: MET  3.  Patient will demonstrate at least 16% improvement with quick Dash score (reporting 31.7% disability or less) indicating improved functional use of affected extremity.  Baseline: Quick Dash: 47.7% deficit 11/22/23 - 31.8% deficit Goal status: IN Progress  4.  Pt will demo and/or verbalize understanding of A/E options and adaptive strategies as needed for ADL/IADLs. Baseline: pt reported difficulty with some ADL/IADL tasks, see objective measures above 11/26/23 - Pt reported improved participation in ADLs/IADLs though continued difficulty with lifting heavy objects and difficulty with wringing mop.  Goal status: IN Progress  ASSESSMENT:  CLINICAL IMPRESSION: Patient is a 63 y.o. female who was seen today for occupational therapy treatment for s/p L carpal tunnel release 10/08/23. Pt tolerating tasks well although continuing to report "soreness" and sensitivity around scar site addressed with cupping today further sensory stimulation with putty also. Pt will benefit from continued skilled OT services in the outpatient setting to work on  impairments as noted at evaluation to help pt return to [facility] as able.    PERFORMANCE DEFICITS: in functional skills including ADLs, IADLs, coordination, dexterity, proprioception, sensation, edema, ROM, strength, flexibility, Fine motor control, Gross motor control, mobility, balance, body mechanics, endurance, and UE functional use, cognitive skills including energy/drive, and psychosocial skills including environmental adaptation.   IMPAIRMENTS: are limiting patient from ADLs, IADLs, rest and sleep, leisure, and social participation.   COMORBIDITIES: may have co-morbidities  that affects occupational performance. Patient will benefit from skilled OT to address above impairments and improve overall function.  REHAB POTENTIAL: Good  PLAN:  OT FREQUENCY: 1x-2x/week (anticipate 2x per week initially, may decrease to 1x per week PRN)  OT DURATION: 6 weeks  PLANNED INTERVENTIONS: 97168 OT Re-evaluation, 97535 self care/ADL training, 65784 therapeutic exercise, 97530 therapeutic activity, 97112 neuromuscular re-education, 97140 manual therapy, 97035 ultrasound, 97018 paraffin, 69629 fluidotherapy, 97010 moist heat, 97010 cryotherapy, 97760 Orthotic Initial, 97761 Prosthetic Initial, 97763 Orthotic/Prosthetic subsequent, passive range of motion, functional mobility training, energy conservation, patient/family education, and DME and/or AE instructions  RECOMMENDED OTHER SERVICES: N/A  CONSULTED AND AGREED WITH PLAN OF CARE: Patient  PLAN FOR NEXT SESSION:  Per  [place]  Hand Protocol 5th Edition  HEP: per [place]  Hand Protocol 5th Edition handouts Scar massage/management - cupping (how did it go?), IASTM (how did it go?) Fluidotherapy Wrist ROM A/E and adaptive strategies for functional tasks Updates from Dr. Merlinda Starling appointment on 5/13 - surgery 5/29 - will DC/hold  Zora Hires, OT 11/28/2023, 7:30 PM

## 2023-12-04 ENCOUNTER — Encounter: Admitting: Occupational Therapy

## 2023-12-05 ENCOUNTER — Encounter: Payer: Self-pay | Admitting: Occupational Therapy

## 2023-12-05 ENCOUNTER — Ambulatory Visit: Admitting: Occupational Therapy

## 2023-12-05 DIAGNOSIS — M6281 Muscle weakness (generalized): Secondary | ICD-10-CM

## 2023-12-05 DIAGNOSIS — L905 Scar conditions and fibrosis of skin: Secondary | ICD-10-CM

## 2023-12-05 DIAGNOSIS — R208 Other disturbances of skin sensation: Secondary | ICD-10-CM

## 2023-12-05 DIAGNOSIS — R278 Other lack of coordination: Secondary | ICD-10-CM

## 2023-12-05 NOTE — Therapy (Signed)
 OUTPATIENT OCCUPATIONAL THERAPY ORTHO TREATMENT  Patient Name: Hannah Huerta MRN: [id number] DOB:Aug 14, 1960, 63 y.o., female Today's Date: 12/05/2023  PCP: Rudine Cos  REFERRING PROVIDER: Merrill Abide, MD   END OF SESSION:  OT End of Session - 12/05/23 0802     Visit Number 8    Number of Visits 13   including eval   Date for OT Re-Evaluation 01/04/24    Authorization Type [employer] Dual 2025, Covered 100%, VL: [place]    OT Start Time 0800    OT Stop Time 0845    OT Time Calculation (min) 45 min    Equipment Utilized During Treatment Cups, Pink Putty    Activity Tolerance Patient tolerated treatment well    Behavior During Therapy WFL for tasks assessed/performed                Past Medical History:  Diagnosis Date   Abnormal Pap smear of cervix    pt couldnt state what type of abnormality   Anemia    Arthritis    Carpal tunnel syndrome, bilateral    CHF (congestive heart failure) (HCC)    Chronic migraine    Diabetes mellitus without complication (HCC)    GERD (gastroesophageal reflux disease)    HLD (hyperlipidemia)    Hypertension    Hypokalemia 06/05/2015   Metabolic syndrome 06/05/2015   Morbid obesity with BMI of 50.0-59.9, adult (HCC)    Prediabetes    S/P cardiac catheterization, non obstructive disease 06/04/15 06/05/2015   Sickle cell trait (HCC)    Spondylolisthesis of lumbar region    Syncope    Past Surgical History:  Procedure Laterality Date   BACK SURGERY     x1 lumbar fusion   CARDIAC CATHETERIZATION N/A 06/04/2015   Procedure: Left Heart Cath and Coronary Angiography;  Surgeon: Arty Binning, MD;  Location: [facility] INVASIVE CV LAB;  Service: Cardiovascular;  Laterality: N/A;   CHOLECYSTECTOMY     laparoscopic   COLONOSCOPY WITH PROPOFOL  N/A 09/04/2014   Procedure: COLONOSCOPY WITH PROPOFOL ;  Surgeon: Kenney Peacemaker, MD;  Location: [facility] ENDOSCOPY;  Service: Endoscopy;  Laterality: N/A;   LEFT HEART CATHETERIZATION WITH CORONARY ANGIOGRAM  N/A 03/08/2012   Procedure: LEFT HEART CATHETERIZATION WITH CORONARY ANGIOGRAM;  Surgeon: Kristopher Pheasant, MD;  Location: [facility] CATH LAB;  Service: Cardiovascular;  Laterality: N/A;   SPINE SURGERY     fusion   TUBAL LIGATION     Patient Active Problem List   Diagnosis Date Noted   Pain in joint of right hip 04/12/2023   Cervical cancer screening 12/21/2022   Type 2 diabetes mellitus without complication, without long-term current use of insulin  (HCC) 08/04/2022   Medial orbital wall fracture, closed, initial encounter (HCC) 10/03/2021   Bilateral carpal tunnel syndrome 09/22/2021   Rheumatoid factor positive 09/22/2021   Bilateral hand swelling 09/22/2021   Spondylolisthesis 05/06/2021   Uncontrolled type 2 diabetes mellitus with microalbuminuria 03/28/2021   Foot-drop 01/04/2021   Status post lumbar spinal fusion 02/12/2020   Spondylolisthesis of lumbar region 02/05/2020   Arthropathy of lumbar facet joint 12/11/2019   Chronic right-sided low back pain with right-sided sciatica 11/04/2019   Other chronic pain 11/04/2019   DDD (degenerative disc disease), lumbar 11/04/2019   Essential hypertension 11/04/2019   Body mass index (BMI) 50.0-59.9, adult (HCC) 11/04/2019   Spinal stenosis of lumbar region 11/04/2019   Spondylolisthesis, lumbar region 11/04/2019   Vitamin D  deficiency 09/10/2019   CHF (congestive heart failure) (HCC) 01/30/2018  OSA (obstructive sleep apnea) 03/01/2017   ARF (acute renal failure) (HCC) 02/28/2017   Hyponatremia 02/28/2017   Syncope 07/23/2016   Syncope and collapse 07/23/2016   S/P cardiac catheterization, non obstructive disease 06/04/15 06/05/2015   CAD in native artery 06/05/2015   Fever 06/05/2015   Hypokalemia 06/05/2015   Metabolic syndrome 06/05/2015   Chest pain, neg MI, possible GI 06/04/2015   Colon cancer screening 08/05/2014   Severe obesity (BMI >= 40) (HCC) 08/05/2014   Health care maintenance 05/13/2014   Prediabetes 05/13/2014    Chronic migraine 04/27/2014   Atypical chest pain 04/27/2014   SOB (shortness of breath) 09/16/2013   Palpitations 09/16/2013   HTN (hypertension) 03/09/2012   Hyperlipidemia 03/09/2012   Microcytic anemia    Morbid obesity with BMI of 50.0-59.9, adult (HCC)    Precordial chest pain 03/06/2012   Dizziness 03/06/2012    ONSET DATE: 11/05/2023 (referral date), s/p L carpal tunnel release 10/08/23  REFERRING DIAG: Z98.890 (ICD-10-CM) - S/P carpal tunnel release   THERAPY DIAG:  Muscle weakness (generalized)  Other lack of coordination  Scar condition and fibrosis of skin  Other disturbances of skin sensation  Rationale for Evaluation and Treatment: Rehabilitation  SUBJECTIVE:   SUBJECTIVE STATEMENT: I don't have any pain in my Lt hand  Pt saw the doctor yesterday - MD note: Given that she has progressed very nicely status post left open carpal tunnel release, we can move forward with surgical scheduling of right open carpal tunnel release. She does have ongoing clinical and electrodiagnostic evidence of right-sided carpal tunnel syndrome that is refractory to conservative care.   R UE CTS surgery is scheduled for May 29th.  Pt accompanied by: self  PERTINENT HISTORY: s/p Lt carpal tunnel release, B carpal tunnel, s/p PLIF L2-3 and L3-4 7/22 due to chronic spinal stenosis with radiculopathy and neurogenic claudication. PMH includes L4-5 decompression and fusion, CAD, CHF, DM II, HLD, HTN, morbid obesity   Per 11/05/23 Referral Noes: Scar massage; s/p carpal tunnel release   PRECAUTIONS: Per [place]  Hand Protocol 5th Edition, Per 11/05/23 MD Progress Notes: "At this juncture, she can continue with activities as tolerated."   RED FLAGS: None   WEIGHT BEARING RESTRICTIONS: No  PAIN:  Are you having pain? No pain today  FALLS: Has patient fallen in last 6 months? Yes. Number of falls : No  LIVING ENVIRONMENT: Lives with: lives alone Lives in: House/apartment Stairs: Yes:  External: 1 steps; grab bar Has following equipment at home: Single point cane, Walker - 2 wheeled, and shower chair  PLOF: Independent with basic ADLs, IADLs, on disability  PATIENT GOALS: "I would like to be able to get the numbness to go away, to move it and do what I did before the carpal tunnel happened."  NEXT MD VISIT: May 29 - Right Carpal Tunnel Surgery  OBJECTIVE:  Note: Objective measures were completed at Evaluation unless otherwise noted.  HAND DOMINANCE: Right  ADLs: Eating: ind Grooming: ind, difficulty with hair styling "but getting better" Upper body dressing: ind, difficulty with buttons Lower body dressing: ind, a little difficulty with donning pants d/t decreased pinch strength of LUE Bathing: currently sponge bathing to protect surgical site though plans to start soon with standard shower though noted "everything takes longer"   Playing tambourine in church - difficulty, has not attempted since the surgery  Laundry - family assists  Dishwashing - slower though ind  Handwriting - difficulty d/t numbness of RUE  FUNCTIONAL OUTCOME MEASURES: Quick Dash:  47.7% deficit      11/22/23 - 31.8% deficit   UPPER EXTREMITY ROM:     Active ROM Right eval Left eval  Shoulder flexion [facility]  Shoulder abduction    Shoulder adduction    Shoulder extension    Shoulder internal rotation    Shoulder external rotation    Elbow flexion    Elbow extension    Wrist flexion    Wrist extension    Wrist ulnar deviation    Wrist radial deviation    Wrist pronation    Wrist supination    (Blank rows = not tested)  Active ROM Right eval Left eval  Thumb MCP (0-60)    Thumb IP (0-80)    Thumb Radial abd/add (0-55)     Thumb Palmar abd/add (0-45)     Thumb Opposition to Small Finger     Index MCP (0-90)     Index PIP (0-100)     Index DIP (0-70)      Long MCP (0-90)      Long PIP (0-100)      Long DIP (0-70)      Ring MCP (0-90)      Ring PIP (0-100)       Ring DIP (0-70)      Little MCP (0-90)      Little PIP (0-100)      Little DIP (0-70)      (Blank rows = not tested)  LUE AROM digit flex/ext WFL though pt unable to make tight grasp.  LUE AROM thumb flex slightly impaired compared to RUE. RUE AROM digit flex/ext WFL.   HAND FUNCTION: Grip strength: Right: 53.5, 51.8, 48.9 (51.4 lbs average) lbs; Left: 15.4 lbs  COORDINATION: 9 Hole Peg test: Right: 25 sec; Left: 21 sec  Pt reported RUE feels "weak" likely secondary to CTS symptoms.  SENSATION: LUE: mild symptoms of tingling at tip of thumb  RUE: moderate symptoms of tingling/numbness "whole hand"  Pt has splint for both hands but mainly only wears splint on R hand at night since surgery was completed on LUE.   EDEMA: mild edema of LUE  COGNITION: Overall cognitive status: Within functional limits for tasks assessed  OBSERVATIONS: Pt demo'd unsteady gait and wall-surfing strategy to maintain balance using single-tip cane with broken rubber cane tip. Pt reported feeling unsteady d/t cane is "slippery" on tile surface. OT provided pt with cane from clinic and pt demo'd improved steadiness of gait. Pt wore AFO on R foot.  OT assessed LUE scar site: Noted loose, dead skin at B edges of scar peeling back though scar ultimately healed well, new pink skin underneath dead skin, no s/s of infection. OT noted tightness of LUE scar. ?Presence of stitch   TODAY'S TREATMENT:                                                                                                                             Ultrasound  x 8 minutes, 3 Mhz, 0.8 wts/cm2, 20% pusled over Lt wrist incision for scar tissue management and any remaining localized swelling.  Scar massage x 5 min over incision - noted more mobile skin following US  and massage  Discussed prep for Rt CTR on 5/29 (dominant hand) and task modifications that may be needed including: practicing hooking buttons and BADLS w/ Lt hand, placing things  in small containers/dishes so that pt can easily manage getting things out of oven/off stove safely with LUE only (until pt progresses to strengthening Rt hand following sx), etc  Pt practiced removing and replacing resistive clothes pins on antenna LUE for pinch strength and functional reach  Assessed most remaining goals and Quick Dash in prep for d/c next session - see below   PATIENT EDUCATION: Education details: Putty Activities Person educated: Patient Education method: Explanation, Demonstration, Tactile cues, and Verbal cues Education comprehension: verbalized understanding, returned demonstration, verbal cues required, tactile cues required, and needs further education  HOME EXERCISE PROGRAM: 11/08/23 - scar massage (see pt instructions) 11/14/23 - Sleep positions, Tendon Glides and Intro to putty exercises 11/15/23 - joint protection (see pt instructions) 11/22/23 - scar massage with tennis ball on tabletop with Lt hand pronated (verbal instructions and demonstration) 11/28/23 - complete Putty activities Access Code: [email or web address]   GOALS: Goals reviewed with patient? Yes  SHORT TERM GOALS: Target date: 12/07/23  Pt will be ind with HEP using visual handouts. Baseline: new to outpt OT Goal status: MET  2.  Pt will ind recall at least 3 joint protection strategies. Baseline: new to outpt OT 11/22/23 - Pt ind recalled being mindful of position of B hands, wearing wrist splint when washing dishes or cooking (carrying heavy items), and improved neutral positioning of wrist when sleeping. Goal status: MET  3.  Pt will return demo of scar massage and edema management techniques. Baseline: OT educated pt on scar massage, pt returned demo.  11/26/23 - Pt reported completing scar massage 2-3x per day and using tennis ball at scar site 1x per day. Pt reported using silicone patch for scar remodeling at night.  Goal status: MET  4.  Pt will ind recall at least 3 sensory safety  precautions Baseline: pt reported numbness/tingling symptoms of BUE, RUE > LUE 11/22/23 - Pt recalled hot, cold, heavy, sharp with min to mod v/c.  11/26/23 - Pt ind recalled sharp, hot, and cold. Goal status: MET  LONG TERM GOALS: Target date: 01/04/24  Pt will report improved ability for "tight" grasp of LUE as needed for ADL/IADLs. Baseline: Pt reported difficulty with "tight" grasp of LUE. 11/26/23 - Pt reported grip is getting stronger with LUE. Pt reported improved ability to open jars/containers. Goal status: MET  2.  Patient will demonstrate at least 30 lbs LUE grip strength as needed to open jars and other containers.  Baseline: Grip strength: Right: 53.5, 51.8, 48.9 (51.4 lbs average) lbs; Left: 15.4 lbs 11/26/23 - Left: 27.7, 36.3, 34.8 (32.9 lbs average) Goal status: MET  3.  Patient will demonstrate at least 16% improvement with quick Dash score (reporting 31.7% disability or less) indicating improved functional use of affected extremity.  Baseline: Quick Dash: 47.7% deficit 11/22/23 - 31.8% deficit Goal status: MET (12/05/23: 11.36%)   4.  Pt will demo and/or verbalize understanding of A/E options and adaptive strategies as needed for ADL/IADLs. Baseline: pt reported difficulty with some ADL/IADL tasks, see objective measures above 11/26/23 - Pt reported improved participation in ADLs/IADLs though continued difficulty with  lifting heavy objects and difficulty with wringing mop.  Goal status: IN Progress  ASSESSMENT:  CLINICAL IMPRESSION: Patient is a 63 y.o. female who was seen today for occupational therapy treatment for s/p L carpal tunnel release 10/08/23. Pt tolerating tasks well with no pain Lt hand. Pt with upcoming CTR to Rt hand. Plan is to d/c next session in prep for surgery.    PERFORMANCE DEFICITS: in functional skills including ADLs, IADLs, coordination, dexterity, proprioception, sensation, edema, ROM, strength, flexibility, Fine motor control, Gross motor control,  mobility, balance, body mechanics, endurance, and UE functional use, cognitive skills including energy/drive, and psychosocial skills including environmental adaptation.   IMPAIRMENTS: are limiting patient from ADLs, IADLs, rest and sleep, leisure, and social participation.   COMORBIDITIES: may have co-morbidities  that affects occupational performance. Patient will benefit from skilled OT to address above impairments and improve overall function.  REHAB POTENTIAL: Good  PLAN:  OT FREQUENCY: 1x-2x/week (anticipate 2x per week initially, may decrease to 1x per week PRN)  OT DURATION: 6 weeks  PLANNED INTERVENTIONS: 97168 OT Re-evaluation, 97535 self care/ADL training, 16109 therapeutic exercise, 97530 therapeutic activity, 97112 neuromuscular re-education, 97140 manual therapy, 97035 ultrasound, 97018 paraffin, 60454 fluidotherapy, 97010 moist heat, 97010 cryotherapy, 97760 Orthotic Initial, 97761 Prosthetic Initial, 97763 Orthotic/Prosthetic subsequent, passive range of motion, functional mobility training, energy conservation, patient/family education, and DME and/or AE instructions  RECOMMENDED OTHER SERVICES: N/A  CONSULTED AND AGREED WITH PLAN OF CARE: Patient  PLAN FOR NEXT SESSION:  Assess remaining goal and d/c next session in prep for upcoming Rt CTR on 5/29. Will need new orders once cleared from MD to return to therapy to work on Rt hand  Velinda Getting, OT 12/05/2023, 8:02 AM

## 2023-12-06 ENCOUNTER — Ambulatory Visit (INDEPENDENT_AMBULATORY_CARE_PROVIDER_SITE_OTHER): Payer: Self-pay

## 2023-12-06 VITALS — Ht 61.0 in | Wt 234.0 lb

## 2023-12-06 DIAGNOSIS — Z0000 Encounter for general adult medical examination without abnormal findings: Secondary | ICD-10-CM

## 2023-12-06 NOTE — Progress Notes (Signed)
 Subjective:   Hannah Huerta is a 63 y.o. female who presents for Medicare Annual (Subsequent) preventive examination.  Visit Complete: Virtual I connected with  Laurey Poor on 12/06/23 by a audio enabled telemedicine application and verified that I am speaking with the correct person using two identifiers.  Patient Location: Home  Provider Location: Office/Clinic  I discussed the limitations of evaluation and management by telemedicine. The patient expressed understanding and agreed to proceed.  Vital Signs: Because this visit was a virtual/telehealth visit, some criteria may be missing or patient reported. Any vitals not documented were not able to be obtained and vitals that have been documented are patient reported.  Patient Medicare AWV questionnaire was completed by the patient on 12/06/23; I have confirmed that all information answered by patient is correct and no changes since this date.  Cardiac Risk Factors include: diabetes mellitus;hypertension;obesity (BMI >30kg/m2);smoking/ tobacco exposure     Objective:     Today's Vitals   12/06/23 1043  Weight: 234 lb (106.1 kg)  Height: 5\' 1"  (1.549 m)   Body mass index is 44.21 kg/m.     12/06/2023   10:51 AM 11/08/2023    8:52 AM 08/19/2023    4:55 AM 02/10/2023    1:51 PM 12/20/2022    8:21 AM 11/30/2022    9:34 AM 01/27/2020   11:33 AM  Advanced Directives  Does Patient Have a Medical Advance Directive? No No No No No No No  Would patient like information on creating a medical advance directive? Yes (MAU/Ambulatory/Procedural Areas - Information given) Yes (MAU/Ambulatory/Procedural Areas - Information given)  No - Patient declined No - Patient declined No - Patient declined No - Patient declined    Current Medications (verified) Outpatient Encounter Medications as of 12/06/2023  Medication Sig   acetaminophen  (TYLENOL ) 325 MG tablet Take 2 tablets (650 mg total) by mouth every 6 (six) hours as needed.   amLODipine   (NORVASC ) 5 MG tablet TAKE 2 TABLETS(10 MG) BY MOUTH DAILY   aspirin  EC 81 MG tablet Take 1 tablet (81 mg total) by mouth daily.   atorvastatin  (LIPITOR ) 40 MG tablet Take 1 tablet (40 mg total) by mouth daily.   carvedilol  (COREG ) 12.5 MG tablet TAKE 1 TABLET(12.5 MG) BY MOUTH TWICE DAILY WITH A MEAL   Iron  Combinations (CHROMAGEN) capsule Take 1 capsule by mouth daily. 65 mg   metFORMIN  (GLUCOPHAGE ) 500 MG tablet Take 1 tablet (500 mg total) by mouth 2 (two) times daily with a meal.   spironolactone  (ALDACTONE ) 25 MG tablet Take 1 tablet (25 mg total) by mouth daily.   ibuprofen  (ADVIL ) 600 MG tablet Take 1 tablet (600 mg total) by mouth every 6 (six) hours as needed. (Patient not taking: Reported on 11/08/2023)   lidocaine  (LIDODERM ) 5 % Place 1 patch onto the skin daily as needed. Remove & Discard patch within 12 hours or as directed by MD (Patient not taking: Reported on 11/13/2023)   naproxen  (NAPROSYN ) 375 MG tablet Take 1 tablet (375 mg total) by mouth 2 (two) times daily. (Patient not taking: Reported on 11/08/2023)   Replens Vaginal Moisturizer GEL Place 1 Application vaginally every 3 (three) days. (Patient not taking: Reported on 11/08/2023)   [DISCONTINUED] gabapentin  (NEURONTIN ) 100 MG capsule TAKE ONE CAPSULE BY MOUTH AT BEDTIME FOR 180 DAYS, THEN 2 CAPSULES AT BEDTIME (Patient not taking: Reported on 11/15/2023)   [DISCONTINUED] oxyCODONE  (ROXICODONE ) 5 MG immediate release tablet Take 1 tablet (5 mg total) by mouth every 6 (  six) hours as needed for severe pain (pain score 7-10). (Patient not taking: Reported on 11/08/2023)   [DISCONTINUED] polyethylene glycol (MIRALAX ) 17 g packet Take 17 g by mouth daily. (Patient not taking: Reported on 11/15/2023)   No facility-administered encounter medications on file as of 12/06/2023.    Allergies (verified) Patient has no known allergies.   History: Past Medical History:  Diagnosis Date   Abnormal Pap smear of cervix    pt couldnt state what  type of abnormality   Anemia    Arthritis    Carpal tunnel syndrome, bilateral    CHF (congestive heart failure) (HCC)    Chronic migraine    Diabetes mellitus without complication (HCC)    GERD (gastroesophageal reflux disease)    HLD (hyperlipidemia)    Hypertension    Hypokalemia 06/05/2015   Metabolic syndrome 06/05/2015   Morbid obesity with BMI of 50.0-59.9, adult (HCC)    Prediabetes    S/P cardiac catheterization, non obstructive disease 06/04/15 06/05/2015   Sickle cell trait (HCC)    Spondylolisthesis of lumbar region    Syncope    Past Surgical History:  Procedure Laterality Date   BACK SURGERY     x1 lumbar fusion   CARDIAC CATHETERIZATION N/A 06/04/2015   Procedure: Left Heart Cath and Coronary Angiography;  Surgeon: Arty Binning, MD;  [facility] INVASIVE CV LAB;  Service: Cardiovascular;  Laterality: N/A;   CHOLECYSTECTOMY     laparoscopic   COLONOSCOPY WITH PROPOFOL  N/A 09/04/2014   Procedure: COLONOSCOPY WITH PROPOFOL ;  Surgeon: Kenney Peacemaker, MD;  Location: [facility] ENDOSCOPY;  Service: Endoscopy;  Laterality: N/A;   LEFT HEART CATHETERIZATION WITH CORONARY ANGIOGRAM N/A 03/08/2012   Procedure: LEFT HEART CATHETERIZATION WITH CORONARY ANGIOGRAM;  Surgeon: Kristopher Pheasant, MD;  Location: [facility] CATH LAB;  Service: Cardiovascular;  Laterality: N/A;   SPINE SURGERY     fusion   TUBAL LIGATION     Family History  Problem Relation Age of Onset   Coronary artery disease Father 57   Heart disease Father    Other Father        died in a MVA   Hypertension Father    Sudden death Mother 79       Questionable MI   Diabetes Mother    Hypertension Mother    Hypertension Sister    Pancreatic cancer Maternal Aunt    Breast cancer Maternal Aunt        after age 36   Stroke Paternal Grandmother    Colon polyps Sister    Colon cancer Neg Hx    Gallbladder disease Neg Hx    Esophageal cancer Neg Hx    Social History   Socioeconomic History   Marital status: Widowed     Spouse name: Not on file   Number of children: 3   Years of education: Not on file   Highest education level: Not on file  Occupational History   Occupation: Works in the Engineer, water: [employer]  Tobacco Use   Smoking status: Never    Passive exposure: Past   Smokeless tobacco: Never  Vaping Use   Vaping status: Never Used  Substance and Sexual Activity   Alcohol use: No   Drug use: No   Sexual activity: Not Currently    Birth control/protection: Surgical  Other Topics Concern   Not on file  Social History Narrative   Lives at home alone.   Social Drivers of Dispensing optician  Resource Strain: Low Risk  (12/06/2023)   Overall Financial Resource Strain (CARDIA)    Difficulty of Paying Living Expenses: Not very hard  Food Insecurity: Food Insecurity Present (12/06/2023)   Hunger Vital Sign    Worried About Running Out of Food in the Last Year: Never true    Ran Out of Food in the Last Year: Sometimes true  Transportation Needs: No Transportation Needs (12/06/2023)   PRAPARE - Administrator, Civil Service (Medical): No    Lack of Transportation (Non-Medical): No  Physical Activity: Insufficiently Active (12/06/2023)   Exercise Vital Sign    Days of Exercise per Week: 2 days    Minutes of Exercise per Session: 20 min  Stress: No Stress Concern Present (12/06/2023)   [employer] of Occupational Health - Occupational Stress Questionnaire    Feeling of Stress : Not at all  Social Connections: Moderately Integrated (12/06/2023)   Social Connection and Isolation Panel [NHANES]    Frequency of Communication with Friends and Family: More than three times a week    Frequency of Social Gatherings with Friends and Family: Once a week    Attends Religious Services: More than 4 times per year    Active Member of [employer] or Organizations: Yes    Attends Banker Meetings: More than 4 times per year    Marital Status: Widowed    Tobacco  Counseling Counseling given: Not Answered   Clinical Intake:  Pre-visit preparation completed: Yes  Pain : No/denies pain     BMI - recorded: 44.21 Nutritional Status: BMI > 30  Obese Nutritional Risks: None Diabetes: No  How often do you need to have someone help you when you read instructions, pamphlets, or other written materials from your doctor or pharmacy?: 1 - Never What is the last grade level you completed in school?: 12  Interpreter Needed?: No  Information entered by :: [facility]   Activities of Daily Living    12/06/2023   10:44 AM 12/06/2023    9:48 AM  In your present state of health, do you have any difficulty performing the following activities:  Hearing? 0 0  Vision? 0 0  Difficulty concentrating or making decisions? 0 0  Walking or climbing stairs? 1 1  Dressing or bathing? 0 0  Doing errands, shopping? 0 0  Preparing Food and eating ? N N  Using the Toilet? N N  In the past six months, have you accidently leaked urine? N N  Do you have problems with loss of bowel control? N N  Managing your Medications? N N  Managing your Finances? N N  Housekeeping or managing your Housekeeping? N N    Patient Care Team: Jegede, Olugbemiga E, MD as PCP - General (Internal Medicine) Matt Song, MD as Consulting Physician (Rheumatology) Celia Coles Angus Kenning, DPM as Consulting Physician (Podiatry) Luella Sager, DPM as Consulting Physician (Podiatry) Garry Kansas, MD as Consulting Physician (Neurosurgery) [employer]  Indicate any recent Medical Services you may have received from other than [facility] providers in the past year (date may be approximate).     Assessment:    This is a routine wellness examination for [place].  Hearing/Vision screen No results found.   Goals Addressed             This Visit's Progress    Patient Stated   On track    Patient stated she would like to begin exercising this year  Weight (lb) < 160 lb  (72.6 kg)   234 lb (106.1 kg)      Depression Screen    12/06/2023   10:52 AM 11/15/2023    9:36 AM 08/31/2023   11:18 AM 12/21/2022   10:46 AM 11/30/2022    9:26 AM 11/03/2022    8:13 AM 04/27/2022   10:39 AM  PHQ 2/9 Scores  PHQ - 2 Score 0 0 0 0 0 0 0  PHQ- 9 Score       1    Fall Risk    12/06/2023   10:51 AM 12/06/2023    9:48 AM 11/15/2023    9:35 AM 05/04/2023    8:15 AM 12/21/2022   10:45 AM  Fall Risk   Falls in the past year? 0 0 0 0 1  Number falls in past yr: 0 0 0 0 1  Injury with Fall? 0 1 0 0 1  Comment     Fracture foot.  Risk for fall due to : No Fall Risks  No Fall Risks No Fall Risks Other (Comment)  Risk for fall due to: Comment     [place] out side  Follow up Falls evaluation completed  Falls evaluation completed  Falls evaluation completed    MEDICARE RISK AT HOME: Medicare Risk at Home Any stairs in or around the home?: No If so, are there any without handrails?: No Home free of loose throw rugs in walkways, pet beds, electrical cords, etc?: Yes Adequate lighting in your home to reduce risk of falls?: Yes Life alert?: No Use of a cane, walker or w/c?: Yes Grab bars in the bathroom?: No Shower chair or bench in shower?: No Elevated toilet seat or a handicapped toilet?: No  TIMED UP AND GO:  Was the test performed?  No    Cognitive Function:        12/06/2023   10:52 AM 11/30/2022    9:38 AM 11/24/2021   10:07 AM  6CIT Screen  What Year? 0 points 0 points 0 points  What month? 0 points 0 points 0 points  What time? 0 points 0 points 0 points  Count back from 20 0 points 0 points 2 points  Months in reverse 0 points 0 points 2 points  Repeat phrase 2 points 0 points 0 points  Total Score 2 points 0 points 4 points    Immunizations Immunization History  Administered Date(s) Administered   Influenza,inj,Quad PF,6+ Mos 04/28/2014, 07/25/2016   Janssen (J&J) SARS-COV-2 Vaccination 11/03/2019   Pneumococcal Polysaccharide-23 04/28/2014,  03/01/2017   Tdap 09/15/2012    TDAP status: Up to date  Flu Vaccine status: Up to date  Pneumococcal vaccine status: Due, Education has been provided regarding the importance of this vaccine. Advised may receive this vaccine at local pharmacy or Health Dept. Aware to provide a copy of the vaccination record if obtained from local pharmacy or Health Dept. Verbalized acceptance and understanding.  Covid-19 vaccine status: Declined, Education has been provided regarding the importance of this vaccine but patient still declined. Advised may receive this vaccine at local pharmacy or Health Dept.or vaccine clinic. Aware to provide a copy of the vaccination record if obtained from local pharmacy or Health Dept. Verbalized acceptance and understanding.  Qualifies for Shingles Vaccine? Yes   Zostavax completed Yes   Shingrix Completed?: Yes  Screening Tests Health Maintenance  Topic Date Due   Zoster Vaccines- Shingrix (1 of 2) Never done   Pneumococcal Vaccine 66-29 Years old (  2 of 2 - PCV) 03/01/2018   COVID-19 Vaccine (2 - 2024-25 season) 03/18/2023   FOOT EXAM  08/09/2023   DTaP/Tdap/Td (2 - Td or Tdap) 08/16/2024 (Originally 09/16/2022)   INFLUENZA VACCINE  02/15/2024   OPHTHALMOLOGY EXAM  03/12/2024   HEMOGLOBIN A1C  05/17/2024   Colonoscopy  09/04/2024   Diabetic kidney evaluation - eGFR measurement  11/14/2024   Diabetic kidney evaluation - Urine ACR  11/14/2024   Medicare Annual Wellness (AWV)  12/05/2024   MAMMOGRAM  12/27/2024   Cervical Cancer Screening (HPV/Pap Cotest)  12/20/2025   Hepatitis C Screening  Completed   HIV Screening  Completed   HPV VACCINES  Aged Out   Meningococcal B Vaccine  Aged Out    Health Maintenance  Health Maintenance Due  Topic Date Due   Zoster Vaccines- Shingrix (1 of 2) Never done   Pneumococcal Vaccine 75-44 Years old (2 of 2 - PCV) 03/01/2018   COVID-19 Vaccine (2 - 2024-25 season) 03/18/2023   FOOT EXAM  08/09/2023    Colorectal  cancer screening: Type of screening: Colonoscopy. Completed 09/04/14. Repeat every 10 years  Mammogram status: Completed 12/28/22. Repeat every year   Lung Cancer Screening: (Low Dose CT Chest recommended if Age 50-80 years, 20 pack-year currently smoking OR have quit w/in 15years.) does not qualify.   Lung Cancer Screening Referral: N/A  Additional Screening:  Hepatitis C Screening: does not qualify; Completed 03/07/2017  Vision Screening: Recommended annual ophthalmology exams for early detection of glaucoma and other disorders of the eye. Is the patient up to date with their annual eye exam?  Yes  Who is the provider or what is the name of the office in which the patient attends annual eye exams? [facility] If pt is not established with a provider, would they like to be referred to a provider to establish care? No .   Dental Screening: Recommended annual dental exams for proper oral hygiene  Diabetic Foot Exam: Diabetic Foot Exam: Overdue, Pt has been advised about the importance in completing this exam. Pt is scheduled for diabetic foot exam on 02/15/24.  Community Resource Referral / Chronic Care Management: CRR required this visit?  No   CCM required this visit?  No     Plan:     I have personally reviewed and noted the following in the patient's chart:   Medical and social history Use of alcohol, tobacco or illicit drugs  Current medications and supplements including opioid prescriptions. Patient is not currently taking opioid prescriptions. Functional ability and status Nutritional status Physical activity Advanced directives List of other physicians Hospitalizations, surgeries, and ER visits in previous 12 months Vitals Screenings to include cognitive, depression, and falls Referrals and appointments  In addition, I have reviewed and discussed with patient certain preventive protocols, quality metrics, and best practice recommendations. A written personalized care  plan for preventive services as well as general preventive health recommendations were provided to patient.     Julian Obey, RMA   12/06/2023   After Visit Summary: (MyChart) Due to this being a telephonic visit, the after visit summary with patients personalized plan was offered to patient via MyChart   Nurse Notes: Thank you for your time.   Julian Obey   CMA II

## 2023-12-07 ENCOUNTER — Ambulatory Visit: Admitting: Occupational Therapy

## 2023-12-07 DIAGNOSIS — R278 Other lack of coordination: Secondary | ICD-10-CM

## 2023-12-07 DIAGNOSIS — M6281 Muscle weakness (generalized): Secondary | ICD-10-CM

## 2023-12-07 DIAGNOSIS — R208 Other disturbances of skin sensation: Secondary | ICD-10-CM

## 2023-12-07 DIAGNOSIS — L905 Scar conditions and fibrosis of skin: Secondary | ICD-10-CM

## 2023-12-07 NOTE — Therapy (Signed)
 OUTPATIENT OCCUPATIONAL THERAPY ORTHO TREATMENT / DISCHARGE  Patient Name: Hannah Huerta MRN: [id number] DOB:Aug 30, 1960, 63 y.o., female Today's Date: 12/07/2023  PCP: Rudine Cos  REFERRING PROVIDER: Merrill Abide, MD   OCCUPATIONAL THERAPY DISCHARGE SUMMARY  Visits from Start of Care: 9  Current functional level related to goals / functional outcomes: Pt has met 100% of STG and LTG to satisfactory levels and is pleased with outcomes.   Remaining deficits: Pt demo'ing good understanding of LUE HEP and scar management to continue with self-management. Pt has upcoming surgery for RUE carpal tunnel release scheduled on 12/13/23.   Education / Equipment: Pt has all needed materials and education. Pt understands how to continue on with self-management. See tx notes for more details.    Patient goals were met. Patient is being discharged due to meeting the stated rehab goals. and pt is pleased with current functional level. Pt agreeable to D/C. Pt confirmed no additional questions/concerns related to OT at this time.   Note: Pt has upcoming Rt CTR on 12/13/23. Will need new orders once cleared from MD to return to therapy to work on Rt hand. Pt verbalized understanding.    END OF SESSION:  OT End of Session - 12/07/23 0929     Visit Number 9    Number of Visits 13   including eval   Date for OT Re-Evaluation 01/04/24    Authorization Type [employer] Dual 2025, Covered 100%, VL: [place]    OT Start Time 0848    OT Stop Time 0926    OT Time Calculation (min) 38 min    Activity Tolerance Patient tolerated treatment well    Behavior During Therapy WFL for tasks assessed/performed                 Past Medical History:  Diagnosis Date   Abnormal Pap smear of cervix    pt couldnt state what type of abnormality   Anemia    Arthritis    Carpal tunnel syndrome, bilateral    CHF (congestive heart failure) (HCC)    Chronic migraine    Diabetes mellitus without complication  (HCC)    GERD (gastroesophageal reflux disease)    HLD (hyperlipidemia)    Hypertension    Hypokalemia 06/05/2015   Metabolic syndrome 06/05/2015   Morbid obesity with BMI of 50.0-59.9, adult (HCC)    Prediabetes    S/P cardiac catheterization, non obstructive disease 06/04/15 06/05/2015   Sickle cell trait (HCC)    Spondylolisthesis of lumbar region    Syncope    Past Surgical History:  Procedure Laterality Date   BACK SURGERY     x1 lumbar fusion   CARDIAC CATHETERIZATION N/A 06/04/2015   Procedure: Left Heart Cath and Coronary Angiography;  Surgeon: Arty Binning, MD;  Location: [facility] INVASIVE CV LAB;  Service: Cardiovascular;  Laterality: N/A;   CHOLECYSTECTOMY     laparoscopic   COLONOSCOPY WITH PROPOFOL  N/A 09/04/2014   Procedure: COLONOSCOPY WITH PROPOFOL ;  Surgeon: Kenney Peacemaker, MD;  Location: [facility] ENDOSCOPY;  Service: Endoscopy;  Laterality: N/A;   LEFT HEART CATHETERIZATION WITH CORONARY ANGIOGRAM N/A 03/08/2012   Procedure: LEFT HEART CATHETERIZATION WITH CORONARY ANGIOGRAM;  Surgeon: Kristopher Pheasant, MD;  Location: [facility] CATH LAB;  Service: Cardiovascular;  Laterality: N/A;   SPINE SURGERY     fusion   TUBAL LIGATION     Patient Active Problem List   Diagnosis Date Noted   Pain in joint of right hip 04/12/2023  Cervical cancer screening 12/21/2022   Type 2 diabetes mellitus without complication, without long-term current use of insulin  (HCC) 08/04/2022   Medial orbital wall fracture, closed, initial encounter (HCC) 10/03/2021   Bilateral carpal tunnel syndrome 09/22/2021   Rheumatoid factor positive 09/22/2021   Bilateral hand swelling 09/22/2021   Spondylolisthesis 05/06/2021   Uncontrolled type 2 diabetes mellitus with microalbuminuria 03/28/2021   Foot-drop 01/04/2021   Status post lumbar spinal fusion 02/12/2020   Spondylolisthesis of lumbar region 02/05/2020   Arthropathy of lumbar facet joint 12/11/2019   Chronic right-sided low back pain with right-sided  sciatica 11/04/2019   Other chronic pain 11/04/2019   DDD (degenerative disc disease), lumbar 11/04/2019   Essential hypertension 11/04/2019   Body mass index (BMI) 50.0-59.9, adult (HCC) 11/04/2019   Spinal stenosis of lumbar region 11/04/2019   Spondylolisthesis, lumbar region 11/04/2019   Vitamin D  deficiency 09/10/2019   CHF (congestive heart failure) (HCC) 01/30/2018   OSA (obstructive sleep apnea) 03/01/2017   ARF (acute renal failure) (HCC) 02/28/2017   Hyponatremia 02/28/2017   Syncope 07/23/2016   Syncope and collapse 07/23/2016   S/P cardiac catheterization, non obstructive disease 06/04/15 06/05/2015   CAD in native artery 06/05/2015   Fever 06/05/2015   Hypokalemia 06/05/2015   Metabolic syndrome 06/05/2015   Chest pain, neg MI, possible GI 06/04/2015   Colon cancer screening 08/05/2014   Severe obesity (BMI >= 40) (HCC) 08/05/2014   Health care maintenance 05/13/2014   Prediabetes 05/13/2014   Chronic migraine 04/27/2014   Atypical chest pain 04/27/2014   SOB (shortness of breath) 09/16/2013   Palpitations 09/16/2013   HTN (hypertension) 03/09/2012   Hyperlipidemia 03/09/2012   Microcytic anemia    Morbid obesity with BMI of 50.0-59.9, adult (HCC)    Precordial chest pain 03/06/2012   Dizziness 03/06/2012    ONSET DATE: 11/05/2023 (referral date), s/p L carpal tunnel release 10/08/23  REFERRING DIAG: Z98.890 (ICD-10-CM) - S/P carpal tunnel release   THERAPY DIAG:  Muscle weakness (generalized)  Other lack of coordination  Scar condition and fibrosis of skin  Other disturbances of skin sensation  Rationale for Evaluation and Treatment: Rehabilitation  SUBJECTIVE:   SUBJECTIVE STATEMENT: Pt reported upcoming surgery for RUE CTS with Dr Merlinda Starling. Pt reported doing more tasks with L hand and "it's not bothering me." Pt reported being mindful of joint positioning.   Per 11/27/23 MD Office Visit - MD note: Given that she has progressed very nicely status post  left open carpal tunnel release, we can move forward with surgical scheduling of right open carpal tunnel release. She does have ongoing clinical and electrodiagnostic evidence of right-sided carpal tunnel syndrome that is refractory to conservative care.   R UE CTS surgery is scheduled for May 29th.  Pt accompanied by: self  PERTINENT HISTORY: s/p Lt carpal tunnel release, B carpal tunnel, s/p PLIF L2-3 and L3-4 7/22 due to chronic spinal stenosis with radiculopathy and neurogenic claudication. PMH includes L4-5 decompression and fusion, CAD, CHF, DM II, HLD, HTN, morbid obesity   Per 11/05/23 Referral Noes: Scar massage; s/p carpal tunnel release   PRECAUTIONS: Per [place]  Hand Protocol 5th Edition, Per 11/05/23 MD Progress Notes: "At this juncture, she can continue with activities as tolerated."   RED FLAGS: None   WEIGHT BEARING RESTRICTIONS: No  PAIN:  Are you having pain? No pain today  FALLS: Has patient fallen in last 6 months? Yes. Number of falls : No  LIVING ENVIRONMENT: Lives with: lives alone Lives in: House/apartment Stairs:  Yes: External: 1 steps; grab bar Has following equipment at home: Single point cane, Walker - 2 wheeled, and shower chair  PLOF: Independent with basic ADLs, IADLs, on disability  PATIENT GOALS: "I would like to be able to get the numbness to go away, to move it and do what I did before the carpal tunnel happened."  NEXT MD VISIT: May 29 - Right Carpal Tunnel Surgery  OBJECTIVE:  Note: Objective measures were completed at Evaluation unless otherwise noted.  HAND DOMINANCE: Right  ADLs: Eating: ind Grooming: ind, difficulty with hair styling "but getting better" Upper body dressing: ind, difficulty with buttons Lower body dressing: ind, a little difficulty with donning pants d/t decreased pinch strength of LUE Bathing: currently sponge bathing to protect surgical site though plans to start soon with standard shower though noted "everything  takes longer"   Playing tambourine in church - difficulty, has not attempted since the surgery  Laundry - family assists  Dishwashing - slower though ind  Handwriting - difficulty d/t numbness of RUE  FUNCTIONAL OUTCOME MEASURES: Quick Dash: 47.7% deficit      11/22/23 - 31.8% deficit   UPPER EXTREMITY ROM:     Active ROM Right eval Left eval  Shoulder flexion [facility]  Shoulder abduction    Shoulder adduction    Shoulder extension    Shoulder internal rotation    Shoulder external rotation    Elbow flexion    Elbow extension    Wrist flexion    Wrist extension    Wrist ulnar deviation    Wrist radial deviation    Wrist pronation    Wrist supination    (Blank rows = not tested)  Active ROM Right eval Left eval  Thumb MCP (0-60)    Thumb IP (0-80)    Thumb Radial abd/add (0-55)     Thumb Palmar abd/add (0-45)     Thumb Opposition to Small Finger     Index MCP (0-90)     Index PIP (0-100)     Index DIP (0-70)      Long MCP (0-90)      Long PIP (0-100)      Long DIP (0-70)      Ring MCP (0-90)      Ring PIP (0-100)      Ring DIP (0-70)      Little MCP (0-90)      Little PIP (0-100)      Little DIP (0-70)      (Blank rows = not tested)  LUE AROM digit flex/ext WFL though pt unable to make tight grasp.  LUE AROM thumb flex slightly impaired compared to RUE. RUE AROM digit flex/ext WFL.   HAND FUNCTION: Grip strength: Right: 53.5, 51.8, 48.9 (51.4 lbs average) lbs; Left: 15.4 lbs  COORDINATION: 9 Hole Peg test: Right: 25 sec; Left: 21 sec  Pt reported RUE feels "weak" likely secondary to CTS symptoms.  SENSATION: LUE: mild symptoms of tingling at tip of thumb  RUE: moderate symptoms of tingling/numbness "whole hand"  Pt has splint for both hands but mainly only wears splint on R hand at night since surgery was completed on LUE.   EDEMA: mild edema of LUE  COGNITION: Overall cognitive status: Within functional limits for tasks  assessed  OBSERVATIONS: Pt demo'd unsteady gait and wall-surfing strategy to maintain balance using single-tip cane with broken rubber cane tip. Pt reported feeling unsteady d/t cane is "slippery" on tile surface. OT provided pt with cane from  clinic and pt demo'd improved steadiness of gait. Pt wore AFO on R foot.  OT assessed LUE scar site: Noted loose, dead skin at B edges of scar peeling back though scar ultimately healed well, new pink skin underneath dead skin, no s/s of infection. OT noted tightness of LUE scar. ?Presence of stitch   TODAY'S TREATMENT:                                                                                                                             TherAct OT assessed pt's progress towards goals, see below for updates.   Discussed using theraputty instead of ball squeezes to build strength to improve joint protection. Pt verbalized understanding.  Review of HEP: tendon glides, theraputty (simplified and updated, provided additional handout per pt request) - reviewed strategies to avoid use of R hand immediately after RUE surgery when continuing to complete LUE exercises HEP. Pt returned demo with v/c.  Discussed avoiding scar massage using RUE on LUE immediately after RUE surgery. Recommended to use tennis ball on tabletop for scar massage of LUE. Pt verbalized understanding.  Manual STM scar massage - to decrease scar tightness, to manage scar tissue. Pt reported scar seemed less tight following scar massage. OT similarly noted decreased tightness of scar following STM.    PATIENT EDUCATION: Education details: see today's tx Person educated: Patient Education method: Explanation, Demonstration, Tactile cues, and Verbal cues Education comprehension: verbalized understanding, returned demonstration, verbal cues required, tactile cues required, and needs further education  HOME EXERCISE PROGRAM: 11/08/23 - scar massage (see pt instructions) 11/14/23 - Sleep  positions, Tendon Glides and Intro to putty exercises 11/15/23 - joint protection (see pt instructions) 11/22/23 - scar massage with tennis ball on tabletop with Lt hand pronated (verbal instructions and demonstration) 11/28/23 - complete Putty activities Access Code: [email or web address]   GOALS: Goals reviewed with patient? Yes  SHORT TERM GOALS: Target date: 12/07/23  Pt will be ind with HEP using visual handouts. Baseline: new to outpt OT Goal status: MET  2.  Pt will ind recall at least 3 joint protection strategies. Baseline: new to outpt OT 11/22/23 - Pt ind recalled being mindful of position of B hands, wearing wrist splint when washing dishes or cooking (carrying heavy items), and improved neutral positioning of wrist when sleeping. Goal status: MET  3.  Pt will return demo of scar massage and edema management techniques. Baseline: OT educated pt on scar massage, pt returned demo.  11/26/23 - Pt reported completing scar massage 2-3x per day and using tennis ball at scar site 1x per day. Pt reported using silicone patch for scar remodeling at night.  Goal status: MET  4.  Pt will ind recall at least 3 sensory safety precautions Baseline: pt reported numbness/tingling symptoms of BUE, RUE > LUE 11/22/23 - Pt recalled hot, cold, heavy, sharp with min to mod v/c.  11/26/23 - Pt ind recalled sharp, hot, and cold. Goal status: MET  LONG TERM GOALS: Target date: 01/04/24  Pt will report improved ability for "tight" grasp of LUE as needed for ADL/IADLs. Baseline: Pt reported difficulty with "tight" grasp of LUE. 11/26/23 - Pt reported grip is getting stronger with LUE. Pt reported improved ability to open jars/containers. Goal status: MET  2.  Patient will demonstrate at least 30 lbs LUE grip strength as needed to open jars and other containers.  Baseline: Grip strength: Right: 53.5, 51.8, 48.9 (51.4 lbs average) lbs; Left: 15.4 lbs 11/26/23 - Left: 27.7, 36.3, 34.8 (32.9 lbs average) Goal status:  MET  3.  Patient will demonstrate at least 16% improvement with quick Dash score (reporting 31.7% disability or less) indicating improved functional use of affected extremity.  Baseline: Quick Dash: 47.7% deficit 11/22/23 - 31.8% deficit Goal status: MET (12/05/23: 11.36%)   4.  Pt will demo and/or verbalize understanding of A/E options and adaptive strategies as needed for ADL/IADLs. Baseline: pt reported difficulty with some ADL/IADL tasks, see objective measures above 11/26/23 - Pt reported improved participation in ADLs/IADLs though continued difficulty with lifting heavy objects and difficulty with wringing mop.  12/07/23 - Pt reported "I pretty much got it handled" using A/E and adaptive strategies for daily tasks. Pt ind recalled several strategies for daily tasks. Pt reported no additional questions/concerns.  Goal status: MET  ASSESSMENT:  CLINICAL IMPRESSION: Patient is a 63 y.o. female who was seen today for occupational therapy treatment for s/p L carpal tunnel release 10/08/23. Pt tolerating tasks well with no pain Lt hand.    Pt has met 100% of STG and LTG to satisfactory levels and is pleased with outcomes. Pt demo'ing good understanding of LUE HEP and scar management to continue with self-management. Pt has upcoming surgery for RUE carpal tunnel release scheduled on 12/13/23. Pt has all needed materials and education. Pt understands how to continue on with self-management. See tx notes for more details. Patient goals were met. Patient is being discharged due to meeting the stated rehab goals and pt is pleased with current functional level. Pt agreeable to D/C. Pt confirmed no additional questions/concerns related to OT at this time.   Note: Pt has upcoming Rt CTR on 12/13/23. Will need new orders once cleared from MD to return to therapy to work on Rt hand. Pt verbalized understanding.   PERFORMANCE DEFICITS: in functional skills including ADLs, IADLs, coordination, dexterity,  proprioception, sensation, edema, ROM, strength, flexibility, Fine motor control, Gross motor control, mobility, balance, body mechanics, endurance, and UE functional use, cognitive skills including energy/drive, and psychosocial skills including environmental adaptation.   IMPAIRMENTS: are limiting patient from ADLs, IADLs, rest and sleep, leisure, and social participation.   COMORBIDITIES: may have co-morbidities  that affects occupational performance. Patient will benefit from skilled OT to address above impairments and improve overall function.  REHAB POTENTIAL: Good  PLAN:  OT FREQUENCY: 1x-2x/week (anticipate 2x per week initially, may decrease to 1x per week PRN)  OT DURATION: 6 weeks  PLANNED INTERVENTIONS: 97168 OT Re-evaluation, 97535 self care/ADL training, 16109 therapeutic exercise, 97530 therapeutic activity, 97112 neuromuscular re-education, 97140 manual therapy, 97035 ultrasound, 97018 paraffin, 60454 fluidotherapy, 97010 moist heat, 97010 cryotherapy, 97760 Orthotic Initial, 97761 Prosthetic Initial, 97763 Orthotic/Prosthetic subsequent, passive range of motion, functional mobility training, energy conservation, patient/family education, and DME and/or AE instructions  RECOMMENDED OTHER SERVICES: N/A  CONSULTED AND AGREED WITH PLAN OF CARE: Patient  PLAN FOR NEXT SESSION:  OT D/C completed for this POC  Pt has upcoming Rt CTR on  12/13/23. Will need new orders once cleared from MD to return to therapy to work on Rt hand. Pt verbalized understanding.   Oakley Bellman, OT 12/07/2023, 9:35 AM

## 2023-12-13 ENCOUNTER — Other Ambulatory Visit: Payer: Self-pay | Admitting: Orthopedic Surgery

## 2023-12-13 DIAGNOSIS — G5601 Carpal tunnel syndrome, right upper limb: Secondary | ICD-10-CM | POA: Diagnosis not present

## 2023-12-18 ENCOUNTER — Ambulatory Visit: Admitting: Cardiology

## 2023-12-26 NOTE — Progress Notes (Signed)
   DORIS GRUHN - 63 y.o. female MRN [id number]  Date of birth: June 12, 1961  Office Visit Note: Visit Date: 12/27/2023 PCP: Jegede, Olugbemiga E, MD Referred by: Jerrlyn Morel, NP  Subjective:  HPI: Hannah Huerta is a 63 y.o. female who presents today for follow up 2 weeks status post Right open carpal tunnel release.  She is doing well overall, numbness and tingling has improved drastically, nocturnal symptoms have resolved.  Pain is well-controlled.  Pertinent ROS were reviewed with the patient and found to be negative unless otherwise specified above in HPI.   Assessment & Plan: Visit Diagnoses:  1. S/P carpal tunnel release     Plan: She is doing quite well overall.  She does have some maceration at the wound site, we will leave sutures in for 1 more week to allow for further healing.  She can begin occupational therapy for range of motion exercises transition to home exercises when deemed appropriate.  Follow-up with myself in 1 week for wound check and likely suture removal at that time.  Follow-up: No follow-ups on file.   Meds & Orders: No orders of the defined types were placed in this encounter.   Orders Placed This Encounter  Procedures   Ambulatory referral to Occupational Therapy     Procedures: No procedures performed       Objective:   Vital Signs: LMP 09/15/2013   Ortho Exam Right hand: - Well-healing palmar incision, sutures in place, skin edges well-approximated without erythema or drainage - Composite fist without restriction - Sensation intact light touch in median nerve distribution - 4/5 APB mild thenar atrophy    Imaging: No results found.   Ahniyah Giancola Alvia Jointer, M.D. [facility], Hand Surgery

## 2023-12-27 ENCOUNTER — Ambulatory Visit (INDEPENDENT_AMBULATORY_CARE_PROVIDER_SITE_OTHER): Admitting: Orthopedic Surgery

## 2023-12-27 DIAGNOSIS — Z9889 Other specified postprocedural states: Secondary | ICD-10-CM

## 2023-12-30 ENCOUNTER — Other Ambulatory Visit: Payer: Self-pay | Admitting: Nurse Practitioner

## 2023-12-30 DIAGNOSIS — I10 Essential (primary) hypertension: Secondary | ICD-10-CM

## 2024-01-02 NOTE — Progress Notes (Signed)
   Hannah Huerta - 63 y.o. female MRN [id number]  Date of birth: [birth year]  Office Visit Note: Visit Date: 01/03/2024 PCP: Jegede, Olugbemiga E, MD Referred by: Jegede, Olugbemiga E, MD  Subjective:  HPI: Hannah Huerta is a 63 y.o. female who presents today for follow up 3 weeks status post Right open carpal tunnel release. Aaron Aas  Pertinent ROS were reviewed with the patient and found to be negative unless otherwise specified above in HPI.   Assessment & Plan: Visit Diagnoses: No diagnosis found.  Plan: ***  Follow-up: No follow-ups on file.   Meds & Orders: No orders of the defined types were placed in this encounter.  No orders of the defined types were placed in this encounter.    Procedures: No procedures performed       Objective:   Vital Signs: LMP 09/15/2013   Ortho Exam ***  Imaging: No results found.   Azarius Lambson Alvia Jointer, M.D. [facility], Hand Surgery

## 2024-01-03 ENCOUNTER — Ambulatory Visit: Admitting: Orthopedic Surgery

## 2024-01-03 DIAGNOSIS — Z9889 Other specified postprocedural states: Secondary | ICD-10-CM

## 2024-01-09 ENCOUNTER — Ambulatory Visit: Admitting: Occupational Therapy

## 2024-01-28 ENCOUNTER — Ambulatory Visit (INDEPENDENT_AMBULATORY_CARE_PROVIDER_SITE_OTHER): Admitting: Orthopedic Surgery

## 2024-01-28 DIAGNOSIS — Z9889 Other specified postprocedural states: Secondary | ICD-10-CM

## 2024-01-28 NOTE — Progress Notes (Unsigned)
   Hannah Huerta - 63 y.o. female MRN [id number]  Date of birth: [birth year]  Office Visit Note: Visit Date: 01/28/2024 PCP: Jegede, Olugbemiga E, MD Referred by: Jegede, Olugbemiga E, MD  Subjective:  HPI: Hannah Huerta is a 63 y.o. female who presents today for follow up 6.5 weeks status post Right open carpal tunnel release.  She is doing well overall, continues to improve from a numbness standpoint and functional standpoint.  She is pleased with her progress.  Pertinent ROS were reviewed with the patient and found to be negative unless otherwise specified above in HPI.   Assessment & Plan: Visit Diagnoses:  1. S/P carpal tunnel release     Plan: Continue with activities as tolerated.  She is demonstrating appropriate improvement postoperatively.  Is comfortable with home exercises at this point.  I will plan on seeing her back in approximate 6 weeks to track her progress.  Follow-up: No follow-ups on file.   Meds & Orders: No orders of the defined types were placed in this encounter.  No orders of the defined types were placed in this encounter.    Procedures: No procedures performed       Objective:   Vital Signs: LMP 09/15/2013   Ortho Exam Right hand: - Well-healed palmar incision - Composite fist without restriction - Sensation intact to light touch in median nerve distribution, 2-point discrimination 6 mm - 4+/5 APB mild thenar atrophy   Imaging: No results found.   Kel Senn Afton Alderton, M.D. [facility], Hand Surgery

## 2024-02-06 ENCOUNTER — Encounter: Payer: Self-pay | Admitting: Occupational Therapy

## 2024-02-06 ENCOUNTER — Ambulatory Visit: Attending: Orthopedic Surgery | Admitting: Occupational Therapy

## 2024-02-06 DIAGNOSIS — M6281 Muscle weakness (generalized): Secondary | ICD-10-CM | POA: Diagnosis present

## 2024-02-06 DIAGNOSIS — R278 Other lack of coordination: Secondary | ICD-10-CM | POA: Diagnosis present

## 2024-02-06 DIAGNOSIS — R208 Other disturbances of skin sensation: Secondary | ICD-10-CM | POA: Insufficient documentation

## 2024-02-06 DIAGNOSIS — R29818 Other symptoms and signs involving the nervous system: Secondary | ICD-10-CM | POA: Insufficient documentation

## 2024-02-06 DIAGNOSIS — Z9889 Other specified postprocedural states: Secondary | ICD-10-CM | POA: Diagnosis not present

## 2024-02-06 DIAGNOSIS — L905 Scar conditions and fibrosis of skin: Secondary | ICD-10-CM | POA: Diagnosis present

## 2024-02-06 NOTE — Therapy (Unsigned)
 OUTPATIENT OCCUPATIONAL THERAPY ORTHO EVALUATION  Patient Name: Hannah Huerta MRN: [id number] DOB:Jun 28, 1961, 63 y.o., female Today's Date: 02/06/2024  PCP: Oley Bascom GORMAN LOISE  REFERRING PROVIDER: Arlinda Buster, MD   END OF SESSION:  OT End of Session - 02/06/24 1452     Visit Number 1    Number of Visits 7    Date for OT Re-Evaluation 03/21/24    Authorization Type UHC Dual 2025, Covered 100%, VL: [place]    OT Start Time 1450    OT Stop Time 1530    OT Time Calculation (min) 40 min    Activity Tolerance Patient tolerated treatment well    Behavior During Therapy WFL for tasks assessed/performed          Past Medical History:  Diagnosis Date   Abnormal Pap smear of cervix    pt couldnt state what type of abnormality   Anemia    Arthritis    Carpal tunnel syndrome, bilateral    CHF (congestive heart failure) (HCC)    Chronic migraine    Diabetes mellitus without complication (HCC)    GERD (gastroesophageal reflux disease)    HLD (hyperlipidemia)    Hypertension    Hypokalemia 06/05/2015   Metabolic syndrome 06/05/2015   Morbid obesity with BMI of 50.0-59.9, adult (HCC)    Prediabetes    S/P cardiac catheterization, non obstructive disease 06/04/15 06/05/2015   Sickle cell trait (HCC)    Spondylolisthesis of lumbar region    Syncope    Past Surgical History:  Procedure Laterality Date   BACK SURGERY     x1 lumbar fusion   CARDIAC CATHETERIZATION N/A 06/04/2015   Procedure: Left Heart Cath and Coronary Angiography;  Surgeon: Victory LELON Sharps, MD;  Location: [facility] INVASIVE CV LAB;  Service: Cardiovascular;  Laterality: N/A;   CHOLECYSTECTOMY     laparoscopic   COLONOSCOPY WITH PROPOFOL  N/A 09/04/2014   Procedure: COLONOSCOPY WITH PROPOFOL ;  Surgeon: Lupita FORBES Commander, MD;  Location: [facility];  Service: Endoscopy;  Laterality: N/A;   LEFT HEART CATHETERIZATION WITH CORONARY ANGIOGRAM N/A 03/08/2012   Procedure: LEFT HEART CATHETERIZATION WITH CORONARY ANGIOGRAM;   Surgeon: Debby JONETTA Como, MD;  Location: [facility] CATH LAB;  Service: Cardiovascular;  Laterality: N/A;   SPINE SURGERY     fusion   TUBAL LIGATION     Patient Active Problem List   Diagnosis Date Noted   Pain in joint of right hip 04/12/2023   Cervical cancer screening 12/21/2022   Type 2 diabetes mellitus without complication, without long-term current use of insulin  (HCC) 08/04/2022   Medial orbital wall fracture, closed, initial encounter (HCC) 10/03/2021   Bilateral carpal tunnel syndrome 09/22/2021   Rheumatoid factor positive 09/22/2021   Bilateral hand swelling 09/22/2021   Spondylolisthesis 05/06/2021   Uncontrolled type 2 diabetes mellitus with microalbuminuria 03/28/2021   Foot-drop 01/04/2021   Status post lumbar spinal fusion 02/12/2020   Spondylolisthesis of lumbar region 02/05/2020   Arthropathy of lumbar facet joint 12/11/2019   Chronic right-sided low back pain with right-sided sciatica 11/04/2019   Other chronic pain 11/04/2019   DDD (degenerative disc disease), lumbar 11/04/2019   Essential hypertension 11/04/2019   Body mass index (BMI) 50.0-59.9, adult (HCC) 11/04/2019   Spinal stenosis of lumbar region 11/04/2019   Spondylolisthesis, lumbar region 11/04/2019   Vitamin D  deficiency 09/10/2019   CHF (congestive heart failure) (HCC) 01/30/2018   OSA (obstructive sleep apnea) 03/01/2017   ARF (acute renal failure) (HCC) 02/28/2017   Hyponatremia 02/28/2017  Syncope 07/23/2016   Syncope and collapse 07/23/2016   S/P cardiac catheterization, non obstructive disease 06/04/15 06/05/2015   CAD in native artery 06/05/2015   Fever 06/05/2015   Hypokalemia 06/05/2015   Metabolic syndrome 06/05/2015   Chest pain, neg MI, possible GI 06/04/2015   Colon cancer screening 08/05/2014   Severe obesity (BMI >= 40) (HCC) 08/05/2014   Health care maintenance 05/13/2014   Prediabetes 05/13/2014   Chronic migraine 04/27/2014   Atypical chest pain 04/27/2014   SOB (shortness of  breath) 09/16/2013   Palpitations 09/16/2013   HTN (hypertension) 03/09/2012   Hyperlipidemia 03/09/2012   Microcytic anemia    Morbid obesity with BMI of 50.0-59.9, adult (HCC)    Precordial chest pain 03/06/2012   Dizziness 03/06/2012    ONSET DATE: 12/27/2023 (referral date) s/p L carpal tunnel release 10/08/23;  R  carpal tunnel release 12/13/2023  REFERRING DIAG: Z98.890 (ICD-10-CM) - S/P carpal tunnel release   THERAPY DIAG:  Muscle weakness (generalized)  Other lack of coordination  Scar condition and fibrosis of skin  Other disturbances of skin sensation  Other symptoms and signs involving the nervous system  Rationale for Evaluation and Treatment: Rehabilitation  SUBJECTIVE:   SUBJECTIVE STATEMENT: R carpal tunnel release surgery. Pt wearing brace on R hand.   Pt accompanied by: self  PERTINENT HISTORY: s/p Lt carpal tunnel release, B carpal tunnel, s/p PLIF L2-3 and L3-4 7/22 due to chronic spinal stenosis with radiculopathy and neurogenic claudication. PMH includes L4-5 decompression and fusion, CAD, CHF, DM II, HLD, HTN, morbid obesity   Per 11/05/23 Referral Noes: Scar massage; s/p carpal tunnel release   PRECAUTIONS: activities as tolerated   RED FLAGS: None   WEIGHT BEARING RESTRICTIONS: No  PAIN:  Are you having pain? Yes: NPRS scale: at rest 0/10 Pain location: R wrist Pain description: sore Aggravating factors: playing the tambourine Relieving factors: rest Worst pain in last 24 hours: 7/10  FALLS: Has patient fallen in last 6 months? Yes. Number of falls : No  LIVING ENVIRONMENT: Lives with: lives alone Lives in: House/apartment Stairs: Yes: External: 1 steps; grab bar Has following equipment at home: Single point cane, Walker - 2 wheeled, and shower chair  PLOF: Independent with basic ADLs, IADLs, on disability  PATIENT GOALS: Improved pain and functional use   NEXT MD VISIT: 03/10/2024   OBJECTIVE:  Note: Objective measures were  completed at Evaluation unless otherwise noted.  HAND DOMINANCE: Right  ADLs: mostly mod I  Upper body dressing: ind, difficulty with buttons; puts fastened bra overhead Lower body dressing: ind, doesn't unfasten pants   Handwriting - getting better per pt  FUNCTIONAL OUTCOME MEASURES: Quick Dash: 29.5 % deficit using the R hand    UPPER EXTREMITY ROM:     Active ROM Right eval Left eval  Shoulder flexion [facility]  Shoulder abduction    Shoulder adduction    Shoulder extension    Shoulder internal rotation    Shoulder external rotation    Elbow flexion    Elbow extension    Wrist flexion    Wrist extension    Wrist ulnar deviation    Wrist radial deviation    Wrist pronation    Wrist supination    (Blank rows = not tested)  HAND FUNCTION: Grip strength: Right: 53.5, 51.8, 48.9 (51.4 lbs average) lbs; Left: 15.4 lbs (prior episode) 02/06/2024: Right - 35.4 lbs ; Left - 44.3 lbs  COORDINATION: 9 Hole Peg test: Right: 25 sec; Left: 21  sec (prior episode)  02/06/2024:  Right - 32 sec; Left - 23 sec  SENSATION: Paresthesias reported B though improved since CTRs   Pt has splints for both hands.   EDEMA: mild edema of LUE  COGNITION: Overall cognitive status: Within functional limits for tasks assessed  OBSERVATIONS: Pt ambulates with use of SPC. No loss of balance though altered gait. The pt appears well kept and has glasses donned.    OT assessed RUE scar site: Noted loose, dead skin at B edges of scar peeling back though scar ultimately healed well, new pink skin underneath dead skin, no s/s of infection. OT noted tightness of RUE scar. ?Presence of stitch  TREATMENT:                                                                                                                             OT educated pt on rehabilitation process and results of objective measures in relation to pt specific goals.  Reviewed prior HEP including tendon glides and scar  massage/management.    PATIENT EDUCATION: Education details: see today's tx above Person educated: Patient Education method: Explanation Education comprehension: verbalized understanding  HOME EXERCISE PROGRAM: N/A for this visit  GOALS: Goals reviewed with patient? Yes  SHORT TERM GOALS: Target date: 03/07/2024   Pt will be ind with HEP using visual handouts. Baseline: new to outpt OT Goal status: INITIAL   2.  Pt will ind recall at least 3 joint protection strategies. Baseline:  Goal status: INITIAL   3.  Pt will return demo of scar massage and edema management techniques. Baseline:  Goal status: INITIAL   4.  Pt will independently recall the 5 main sensory precautions (cold, heat, sharp, chemical, and heavy) as needed to prevent injury/harm secondary to impairments.   Baseline:  Goal status: INITIAL  LONG TERM GOALS: Target date: 03/21/24   Patient will demonstrate updated RUE HEP with visual handouts only for proper execution. Baseline:  Goal status: INITIAL   2.  Patient will demonstrate at least 45 lbs RUE grip strength as needed to open jars and other containers.  Baseline: 11/08/2023 -Right: 53.5, 51.8, 48.9 (51.4 lbs average) lbs; Left: 15.4 lbs Eval 02/06/2024: Right - 35.4 lbs ; Left - 44.3 lbs Goal status: INITIAL   3.  Patient will report no more than mild difficulty carrying a shopping bag or briefcase in the RUE.  Baseline: unable Goal status: INITIAL   4.  Patient will demo improved FM coordination as evidenced by completing nine-hole peg with use of R in 28 seconds or less. Baseline: 11/08/2023 - Right: 25 sec; Left: 21 sec Eval 02/06/2024:  Right - 32 sec; Left - 23 sec Goal status: INITIAL  ASSESSMENT:  CLINICAL IMPRESSION: Patient is a 63 y.o. female who was seen today for occupational therapy evaluation for s/p R carpal tunnel release 10/08/23.  Hx includes s/p Lt carpal tunnel release 10/08/23, s/p PLIF L2-3 and L3-4 7/22 due to  chronic spinal  stenosis with radiculopathy and neurogenic claudication, L4-5 decompression and fusion, CAD, CHF, DM II, HLD, HTN, morbid obesity . Patient currently presents below baseline level of functioning demonstrating functional deficits and impairments as noted below. Pt would benefit from skilled OT services in the outpatient setting to work on impairments as noted.  PERFORMANCE DEFICITS: in functional skills including ADLs, IADLs, coordination, dexterity, proprioception, sensation, edema, ROM, strength, flexibility, Fine motor control, Gross motor control, mobility, balance, body mechanics, endurance, and UE functional use, cognitive skills including energy/drive, and psychosocial skills including environmental adaptation.   IMPAIRMENTS: are limiting patient from ADLs, IADLs, rest and sleep, leisure, and social participation.   COMORBIDITIES: may have co-morbidities  that affects occupational performance. Patient will benefit from skilled OT to address above impairments and improve overall function.  MODIFICATION OR ASSISTANCE TO COMPLETE EVALUATION: Min-Moderate modification of tasks or assist with assess necessary to complete an evaluation.  OT OCCUPATIONAL PROFILE AND HISTORY: Detailed assessment: Review of records and additional review of physical, cognitive, psychosocial history related to current functional performance.  CLINICAL DECISION MAKING: Moderate - several treatment options, min-mod task modification necessary  REHAB POTENTIAL: Good  EVALUATION COMPLEXITY: Moderate      PLAN:  OT FREQUENCY: 1xweek   OT DURATION: 6 weeks  PLANNED INTERVENTIONS: 97168 OT Re-evaluation, 97535 self care/ADL training, 02889 therapeutic exercise, 97530 therapeutic activity, 97112 neuromuscular re-education, 97140 manual therapy, 97035 ultrasound, 97018 paraffin, 02960 fluidotherapy, 97010 moist heat, 97010 cryotherapy, 97760 Orthotic Initial, 97761 Prosthetic Initial, 97763 Orthotic/Prosthetic  subsequent, passive range of motion, functional mobility training, energy conservation, patient/family education, and DME and/or AE instructions  RECOMMENDED OTHER SERVICES: N/A  CONSULTED AND AGREED WITH PLAN OF CARE: Patient  PLAN FOR NEXT SESSION:  HEP: tendon glides Scar massage Assess scar site Joint protection handout A/E and adaptive strategies Sensory safety precautions  Jocelyn CHRISTELLA Bottom, OT 02/06/2024, 7:22 PM

## 2024-02-12 ENCOUNTER — Ambulatory Visit: Admitting: Occupational Therapy

## 2024-02-12 DIAGNOSIS — R278 Other lack of coordination: Secondary | ICD-10-CM

## 2024-02-12 DIAGNOSIS — L905 Scar conditions and fibrosis of skin: Secondary | ICD-10-CM

## 2024-02-12 DIAGNOSIS — R208 Other disturbances of skin sensation: Secondary | ICD-10-CM

## 2024-02-12 DIAGNOSIS — M6281 Muscle weakness (generalized): Secondary | ICD-10-CM | POA: Diagnosis not present

## 2024-02-12 DIAGNOSIS — R29818 Other symptoms and signs involving the nervous system: Secondary | ICD-10-CM

## 2024-02-12 NOTE — Therapy (Signed)
 OUTPATIENT OCCUPATIONAL THERAPY ORTHO TREATMENT  Patient Name: Hannah Huerta MRN: [id number] DOB:1960/11/12, 63 y.o., female Today's Date: 02/12/2024  PCP: Oley Bascom GORMAN LOISE  REFERRING PROVIDER: Arlinda Buster, MD   END OF SESSION:  OT End of Session - 02/12/24 1404     Visit Number 2    Number of Visits 7    Date for OT Re-Evaluation 03/21/24    Authorization Type UHC Dual 2025, Covered 100%, VL: [place]    OT Start Time 1404    OT Stop Time 1443    OT Time Calculation (min) 39 min    Activity Tolerance Patient tolerated treatment well    Behavior During Therapy WFL for tasks assessed/performed          Past Medical History:  Diagnosis Date   Abnormal Pap smear of cervix    pt couldnt state what type of abnormality   Anemia    Arthritis    Carpal tunnel syndrome, bilateral    CHF (congestive heart failure) (HCC)    Chronic migraine    Diabetes mellitus without complication (HCC)    GERD (gastroesophageal reflux disease)    HLD (hyperlipidemia)    Hypertension    Hypokalemia 06/05/2015   Metabolic syndrome 06/05/2015   Morbid obesity with BMI of 50.0-59.9, adult (HCC)    Prediabetes    S/P cardiac catheterization, non obstructive disease 06/04/15 06/05/2015   Sickle cell trait (HCC)    Spondylolisthesis of lumbar region    Syncope    Past Surgical History:  Procedure Laterality Date   BACK SURGERY     x1 lumbar fusion   CARDIAC CATHETERIZATION N/A 06/04/2015   Procedure: Left Heart Cath and Coronary Angiography;  Surgeon: Victory LELON Sharps, MD;  Location: [facility] INVASIVE CV LAB;  Service: Cardiovascular;  Laterality: N/A;   CHOLECYSTECTOMY     laparoscopic   COLONOSCOPY WITH PROPOFOL  N/A 09/04/2014   Procedure: COLONOSCOPY WITH PROPOFOL ;  Surgeon: Lupita FORBES Commander, MD;  Location: [facility];  Service: Endoscopy;  Laterality: N/A;   LEFT HEART CATHETERIZATION WITH CORONARY ANGIOGRAM N/A 03/08/2012   Procedure: LEFT HEART CATHETERIZATION WITH CORONARY ANGIOGRAM;   Surgeon: Debby JONETTA Como, MD;  Location: [facility] CATH LAB;  Service: Cardiovascular;  Laterality: N/A;   SPINE SURGERY     fusion   TUBAL LIGATION     Patient Active Problem List   Diagnosis Date Noted   Pain in joint of right hip 04/12/2023   Cervical cancer screening 12/21/2022   Type 2 diabetes mellitus without complication, without long-term current use of insulin  (HCC) 08/04/2022   Medial orbital wall fracture, closed, initial encounter (HCC) 10/03/2021   Bilateral carpal tunnel syndrome 09/22/2021   Rheumatoid factor positive 09/22/2021   Bilateral hand swelling 09/22/2021   Spondylolisthesis 05/06/2021   Uncontrolled type 2 diabetes mellitus with microalbuminuria 03/28/2021   Foot-drop 01/04/2021   Status post lumbar spinal fusion 02/12/2020   Spondylolisthesis of lumbar region 02/05/2020   Arthropathy of lumbar facet joint 12/11/2019   Chronic right-sided low back pain with right-sided sciatica 11/04/2019   Other chronic pain 11/04/2019   DDD (degenerative disc disease), lumbar 11/04/2019   Essential hypertension 11/04/2019   Body mass index (BMI) 50.0-59.9, adult (HCC) 11/04/2019   Spinal stenosis of lumbar region 11/04/2019   Spondylolisthesis, lumbar region 11/04/2019   Vitamin D  deficiency 09/10/2019   CHF (congestive heart failure) (HCC) 01/30/2018   OSA (obstructive sleep apnea) 03/01/2017   ARF (acute renal failure) (HCC) 02/28/2017   Hyponatremia 02/28/2017  Syncope 07/23/2016   Syncope and collapse 07/23/2016   S/P cardiac catheterization, non obstructive disease 06/04/15 06/05/2015   CAD in native artery 06/05/2015   Fever 06/05/2015   Hypokalemia 06/05/2015   Metabolic syndrome 06/05/2015   Chest pain, neg MI, possible GI 06/04/2015   Colon cancer screening 08/05/2014   Severe obesity (BMI >= 40) (HCC) 08/05/2014   Health care maintenance 05/13/2014   Prediabetes 05/13/2014   Chronic migraine 04/27/2014   Atypical chest pain 04/27/2014   SOB (shortness of  breath) 09/16/2013   Palpitations 09/16/2013   HTN (hypertension) 03/09/2012   Hyperlipidemia 03/09/2012   Microcytic anemia    Morbid obesity with BMI of 50.0-59.9, adult (HCC)    Precordial chest pain 03/06/2012   Dizziness 03/06/2012    ONSET DATE: 12/27/2023 (referral date) s/p L carpal tunnel release 10/08/23;  R  carpal tunnel release 12/13/2023  REFERRING DIAG: Z98.890 (ICD-10-CM) - S/P carpal tunnel release   THERAPY DIAG:  Muscle weakness (generalized)  Other lack of coordination  Scar condition and fibrosis of skin  Other disturbances of skin sensation  Other symptoms and signs involving the nervous system  Rationale for Evaluation and Treatment: Rehabilitation  SUBJECTIVE:   SUBJECTIVE STATEMENT: RUE is coming slowly but surely.    Pt accompanied by: self  PERTINENT HISTORY: s/p Lt carpal tunnel release, B carpal tunnel, s/p PLIF L2-3 and L3-4 7/22 due to chronic spinal stenosis with radiculopathy and neurogenic claudication. PMH includes L4-5 decompression and fusion, CAD, CHF, DM II, HLD, HTN, morbid obesity   PRECAUTIONS: activities as tolerated  RED FLAGS: None   WEIGHT BEARING RESTRICTIONS: No  PAIN:  Are you having pain? Yes: NPRS scale: at rest 5/10 Pain location: R wrist Pain description: sore Aggravating factors: playing the tambourine Relieving factors: rest Worst pain in last 24 hours: 7/10  FALLS: Has patient fallen in last 6 months? Yes. Number of falls : No  LIVING ENVIRONMENT: Lives with: lives alone Lives in: House/apartment Stairs: Yes: External: 1 steps; grab bar Has following equipment at home: Single point cane, Walker - 2 wheeled, and shower chair  PLOF: Independent with basic ADLs, IADLs, on disability  PATIENT GOALS: Improved pain and functional use  NEXT MD VISIT: 03/10/2024   OBJECTIVE:  Note: Objective measures were completed at Evaluation unless otherwise noted.  HAND DOMINANCE: Right  ADLs: mostly mod  I  Upper body dressing: ind, difficulty with buttons; puts fastened bra overhead Lower body dressing: ind, doesn't unfasten pants   Handwriting - getting better per pt  FUNCTIONAL OUTCOME MEASURES: Quick Dash: 29.5 % deficit using the R hand    UPPER EXTREMITY ROM:     Active ROM Right eval Left eval  Shoulder flexion [facility]  Shoulder abduction    Shoulder adduction    Shoulder extension    Shoulder internal rotation    Shoulder external rotation    Elbow flexion    Elbow extension    Wrist flexion    Wrist extension    Wrist ulnar deviation    Wrist radial deviation    Wrist pronation    Wrist supination    (Blank rows = not tested)  HAND FUNCTION: Grip strength: Right: 53.5, 51.8, 48.9 (51.4 lbs average) lbs; Left: 15.4 lbs (prior episode) 02/06/2024: Right - 35.4 lbs ; Left - 44.3 lbs  COORDINATION: 9 Hole Peg test: Right: 25 sec; Left: 21 sec (prior episode)  02/06/2024:  Right - 32 sec; Left - 23 sec  SENSATION: Paresthesias reported  B though improved since CTRs  Pt has splints for both hands.   EDEMA: mild edema of LUE  COGNITION: Overall cognitive status: Within functional limits for tasks assessed  OBSERVATIONS: Pt ambulates with use of SPC. No loss of balance though altered gait. The pt appears well kept and has glasses donned.   OT assessed RUE scar site: Noted loose, dead skin at B edges of scar peeling back though scar ultimately healed well, new pink skin underneath dead skin, no s/s of infection. OT noted tightness of RUE scar. ?Presence of stitch  TREATMENT:                                                                                                                            Performed dynamic cupping at site of incision and surrounding area to decrease scar tissue adhesions by mobilizing the soft- tissues such as skin, fascia, neural tissues, muscles, ligaments and tendons and to promote local circulation necessary for optimal functional  movement by lifting and separating the tissue underneath the cup. Improved appearance to incision noted upon completion with less palpable adhesions.  Therapist completed IASTM using edge mobility tool at site of incision using free up lotion as emollient for reduction of scar tissue and to promote improved AROM and pain reduction of affected extremity. Improved appearance to incision noted upon completion with less palpable adhesions. - Ultrasound completed for duration as noted below including:  Ultrasound applied to palmar right hand and wrist for 8 minutes, frequency of 3 MHz, 20% duty cycle, and 1.1 W/cm with pt's arm placed on soft towel for promotion of ROM, edema reduction, and pain reduction in affected extremity.   PATIENT EDUCATION: Education details: see today's tx above Person educated: Patient Education method: Explanation Education comprehension: verbalized understanding  HOME EXERCISE PROGRAM: 11/08/23 - scar massage (see pt instructions)  GOALS: Goals reviewed with patient? Yes  SHORT TERM GOALS: Target date: 03/07/2024   Pt will be ind with HEP using visual handouts. Baseline: new to outpt OT Goal status: INITIAL  2.  Pt will ind recall at least 3 joint protection strategies. Baseline:  Goal status: INITIAL  3.  Pt will return demo of scar massage and edema management techniques. Baseline:  Goal status: INITIAL  4.  Pt will independently recall the 5 main sensory precautions (cold, heat, sharp, chemical, and heavy) as needed to prevent injury/harm secondary to impairments.   Baseline:  Goal status: INITIAL  LONG TERM GOALS: Target date: 03/21/24  Patient will demonstrate updated RUE HEP with visual handouts only for proper execution. Baseline:  Goal status: INITIAL  2.  Patient will demonstrate at least 45 lbs RUE grip strength as needed to open jars and other containers.  Baseline: 11/08/2023 -Right: 53.5, 51.8, 48.9 (51.4 lbs average) lbs; Left: 15.4  lbs Eval 02/06/2024: Right - 35.4 lbs ; Left - 44.3 lbs Goal status: INITIAL  3.  Patient will report no more than mild difficulty carrying a  shopping bag or briefcase in the RUE.  Baseline: unable Goal status: INITIAL  4.  Patient will demo improved FM coordination as evidenced by completing nine-hole peg with use of R in 28 seconds or less. Baseline: 11/08/2023 - Right: 25 sec; Left: 21 sec Eval 02/06/2024:  Right - 32 sec; Left - 23 sec Goal status: INITIAL  ASSESSMENT:  CLINICAL IMPRESSION: Pt demonstrates good understanding of tendon glides though scar is hypertrophic and tight with moderate adhesions. Will continue scar management efforts.   PERFORMANCE DEFICITS: in functional skills including ADLs, IADLs, coordination, dexterity, proprioception, sensation, edema, ROM, strength, flexibility, Fine motor control, Gross motor control, mobility, balance, body mechanics, endurance, and UE functional use, cognitive skills including energy/drive, and psychosocial skills including environmental adaptation.   IMPAIRMENTS: are limiting patient from ADLs, IADLs, rest and sleep, leisure, and social participation.   COMORBIDITIES: may have co-morbidities  that affects occupational performance. Patient will benefit from skilled OT to address above impairments and improve overall function.  REHAB POTENTIAL: Good  PLAN:  OT FREQUENCY: 1xweek   OT DURATION: 6 weeks  PLANNED INTERVENTIONS: 97168 OT Re-evaluation, 97535 self care/ADL training, 02889 therapeutic exercise, 97530 therapeutic activity, 97112 neuromuscular re-education, 97140 manual therapy, 97035 ultrasound, 97018 paraffin, 02960 fluidotherapy, 97010 moist heat, 97010 cryotherapy, 97760 Orthotic Initial, 97761 Prosthetic Initial, 97763 Orthotic/Prosthetic subsequent, passive range of motion, functional mobility training, energy conservation, patient/family education, and DME and/or AE instructions  RECOMMENDED OTHER SERVICES:  N/A  CONSULTED AND AGREED WITH PLAN OF CARE: Patient  PLAN FOR NEXT SESSION:  Per [place]  Hand Protocol 5th Edition  HEP: review tendon glides and Scar massage Assess scar site  Review from prior therapy episode:  Joint protection  A/E and adaptive strategies Sensory safety precautions  Jocelyn CHRISTELLA Bottom, OT 02/12/2024, 2:05 PM

## 2024-02-15 ENCOUNTER — Ambulatory Visit (INDEPENDENT_AMBULATORY_CARE_PROVIDER_SITE_OTHER): Payer: Self-pay | Admitting: Nurse Practitioner

## 2024-02-15 ENCOUNTER — Encounter: Payer: Self-pay | Admitting: Nurse Practitioner

## 2024-02-15 VITALS — BP 148/72 | HR 69 | Temp 98.1°F | Wt 232.6 lb

## 2024-02-15 DIAGNOSIS — E119 Type 2 diabetes mellitus without complications: Secondary | ICD-10-CM

## 2024-02-15 LAB — POCT GLYCOSYLATED HEMOGLOBIN (HGB A1C): Hemoglobin A1C: 6 % — AB (ref 4.0–5.6)

## 2024-02-15 MED ORDER — METFORMIN HCL 500 MG PO TABS: 500.0000 mg | ORAL_TABLET | Freq: Two times a day (BID) | ORAL | 3 refills | Status: DC

## 2024-02-15 MED ORDER — TRIAMCINOLONE ACETONIDE 0.1 % EX CREA: 1.0000 | TOPICAL_CREAM | Freq: Two times a day (BID) | CUTANEOUS | 0 refills | Status: AC

## 2024-02-15 NOTE — Progress Notes (Signed)
 Subjective   Patient ID: Hannah Huerta, female    DOB: March 10, 1961, 63 y.o.   MRN: [id number]  Chief Complaint  Patient presents with   Diabetes    Follow up    Referring provider: Oley Bascom RAMAN, NP  ECKO BEASLEY is a 63 y.o. female with Past Medical History: No date: Abnormal Pap smear of cervix     Comment:  pt couldnt state what type of abnormality No date: Anemia No date: Arthritis No date: Carpal tunnel syndrome, bilateral No date: CHF (congestive heart failure) (HCC) No date: Chronic migraine No date: Diabetes mellitus without complication (HCC) No date: GERD (gastroesophageal reflux disease) No date: HLD (hyperlipidemia) No date: Hypertension 06/05/2015: Hypokalemia 06/05/2015: Metabolic syndrome No date: Morbid obesity with BMI of 50.0-59.9, adult (HCC) No date: Prediabetes 06/05/2015: S/P cardiac catheterization, non obstructive disease 11/ 18/16 No date: Sickle cell trait (HCC) No date: Spondylolisthesis of lumbar region No date: Syncope   HPI  Hypertension: Patient here for follow-up of elevated blood pressure. She is exercising and is adherent to low salt diet. Vital signs stable in office today. Cardiac symptoms none. Patient denies chest pain, exertional chest pressure/discomfort, irregular heart beat, and near-syncope.  Cardiovascular risk factors: diabetes mellitus, hypertension, and obesity (BMI >= 30 kg/m2). Use of agents associated with hypertension: none. History of target organ damage: none.     Diabetes Mellitus: Patient presents for follow up of diabetes. Symptoms: none. Patient denies none.  Evaluation to date has been included: hemoglobin A1C.  Home sugars: patient does not check sugars. Treatment to date: no recent interventions.  A1C in office today is 6.0.       Denies f/c/s, n/v/d, hemoptysis, PND, leg swelling Denies chest pain or edema  Note: recently had surgery for carpal tunnel. Working with PT now for rehab.    No Known  Allergies  Immunization History  Administered Date(s) Administered   Influenza,inj,Quad PF,6+ Mos 04/28/2014, 07/25/2016   Janssen (J&J) SARS-COV-2 Vaccination 11/03/2019   Pneumococcal Polysaccharide-23 04/28/2014, 03/01/2017   Tdap 09/15/2012    Tobacco History: Social History   Tobacco Use  Smoking Status Never   Passive exposure: Past  Smokeless Tobacco Never   Counseling given: Not Answered   Outpatient Encounter Medications as of 02/15/2024  Medication Sig   acetaminophen  (TYLENOL ) 325 MG tablet Take 2 tablets (650 mg total) by mouth every 6 (six) hours as needed.   amLODipine  (NORVASC ) 5 MG tablet TAKE 2 TABLETS(10 MG) BY MOUTH DAILY   aspirin  EC 81 MG tablet Take 1 tablet (81 mg total) by mouth daily.   atorvastatin  (LIPITOR ) 40 MG tablet Take 1 tablet (40 mg total) by mouth daily.   carvedilol  (COREG ) 12.5 MG tablet TAKE 1 TABLET(12.5 MG) BY MOUTH TWICE DAILY WITH A MEAL   Iron  Combinations (CHROMAGEN) capsule Take 1 capsule by mouth daily. 65 mg   spironolactone  (ALDACTONE ) 25 MG tablet Take 1 tablet (25 mg total) by mouth daily.   triamcinolone  cream (KENALOG ) 0.1 % Apply 1 Application topically 2 (two) times daily.   [DISCONTINUED] metFORMIN  (GLUCOPHAGE ) 500 MG tablet Take 1 tablet (500 mg total) by mouth 2 (two) times daily with a meal.   ibuprofen  (ADVIL ) 600 MG tablet Take 1 tablet (600 mg total) by mouth every 6 (six) hours as needed. (Patient not taking: Reported on 11/08/2023)   lidocaine  (LIDODERM ) 5 % Place 1 patch onto the skin daily as needed. Remove & Discard patch within 12 hours or as directed by MD (Patient  not taking: Reported on 11/13/2023)   metFORMIN  (GLUCOPHAGE ) 500 MG tablet Take 1 tablet (500 mg total) by mouth 2 (two) times daily with a meal.   naproxen  (NAPROSYN ) 375 MG tablet Take 1 tablet (375 mg total) by mouth 2 (two) times daily. (Patient not taking: Reported on 11/08/2023)   Replens Vaginal Moisturizer GEL Place 1 Application vaginally every 3  (three) days. (Patient not taking: Reported on 11/08/2023)   No facility-administered encounter medications on file as of 02/15/2024.    Review of Systems  Review of Systems  Constitutional: Negative.   HENT: Negative.    Cardiovascular: Negative.   Gastrointestinal: Negative.   Allergic/Immunologic: Negative.   Neurological: Negative.   Psychiatric/Behavioral: Negative.       Objective:   BP (!) 148/72   Pulse 69   Temp 98.1 F (36.7 C) (Oral)   Wt 232 lb 9.6 oz (105.5 kg)   LMP 09/15/2013   SpO2 100%   BMI 43.95 kg/m   Wt Readings from Last 5 Encounters:  02/15/24 232 lb 9.6 oz (105.5 kg)  12/06/23 234 lb (106.1 kg)  11/15/23 234 lb (106.1 kg)  08/31/23 233 lb 12.8 oz (106.1 kg)  08/19/23 237 lb (107.5 kg)     Physical Exam Vitals and nursing note reviewed.  Constitutional:      General: She is not in acute distress.    Appearance: She is well-developed.  Cardiovascular:     Rate and Rhythm: Normal rate and regular rhythm.  Pulmonary:     Effort: Pulmonary effort is normal.     Breath sounds: Normal breath sounds.  Neurological:     Mental Status: She is alert and oriented to person, place, and time.       Assessment & Plan:   Type 2 diabetes mellitus without complication, without long-term current use of insulin  (HCC) -     POCT glycosylated hemoglobin (Hb A1C) -     CBC -     Lipid panel -     metFORMIN  HCl; Take 1 tablet (500 mg total) by mouth 2 (two) times daily with a meal.  Dispense: 90 tablet; Refill: 3  Other orders -     Triamcinolone  Acetonide; Apply 1 Application topically 2 (two) times daily.  Dispense: 30 g; Refill: 0     Return in about 3 months (around 05/17/2024).   Bascom GORMAN Borer, NP 02/15/2024

## 2024-02-16 LAB — CBC
Hematocrit: 35.7 % (ref 34.0–46.6)
Hemoglobin: 10.8 g/dL — ABNORMAL LOW (ref 11.1–15.9)
MCH: 25.4 pg — ABNORMAL LOW (ref 26.6–33.0)
MCHC: 30.3 g/dL — ABNORMAL LOW (ref 31.5–35.7)
MCV: 84 fL (ref 79–97)
Platelets: 289 x10E3/uL (ref 150–450)
RBC: 4.25 x10E6/uL (ref 3.77–5.28)
RDW: 15 % (ref 11.7–15.4)
WBC: 4.1 x10E3/uL (ref 3.4–10.8)

## 2024-02-16 LAB — LIPID PANEL
Chol/HDL Ratio: 2.4 ratio (ref 0.0–4.4)
Cholesterol, Total: 130 mg/dL (ref 100–199)
HDL: 54 mg/dL (ref >39)
LDL Chol Calc (NIH): 64 mg/dL (ref 0–99)
Triglycerides: 53 mg/dL (ref 0–149)
VLDL Cholesterol Cal: 12 mg/dL (ref 5–40)

## 2024-02-18 ENCOUNTER — Ambulatory Visit: Payer: Self-pay | Admitting: Nurse Practitioner

## 2024-02-19 ENCOUNTER — Ambulatory Visit: Attending: Orthopedic Surgery | Admitting: Occupational Therapy

## 2024-02-19 DIAGNOSIS — L905 Scar conditions and fibrosis of skin: Secondary | ICD-10-CM | POA: Diagnosis present

## 2024-02-19 DIAGNOSIS — M6281 Muscle weakness (generalized): Secondary | ICD-10-CM | POA: Diagnosis present

## 2024-02-19 DIAGNOSIS — R278 Other lack of coordination: Secondary | ICD-10-CM | POA: Insufficient documentation

## 2024-02-19 DIAGNOSIS — R29818 Other symptoms and signs involving the nervous system: Secondary | ICD-10-CM | POA: Insufficient documentation

## 2024-02-19 DIAGNOSIS — R208 Other disturbances of skin sensation: Secondary | ICD-10-CM | POA: Diagnosis present

## 2024-02-19 NOTE — Therapy (Signed)
 OUTPATIENT OCCUPATIONAL THERAPY ORTHO TREATMENT  Patient Name: Hannah Huerta MRN: [id number] DOB:Sep 14, 1960, 63 y.o., female Today's Date: 02/19/2024  PCP: Oley Bascom GORMAN LOISE  REFERRING PROVIDER: Arlinda Buster, MD   END OF SESSION:  OT End of Session - 02/19/24 1108     Visit Number 3    Number of Visits 7    Date for OT Re-Evaluation 03/21/24    Authorization Type [employer] Dual 2025, Covered 100%, VL: [place]    OT Start Time 1105    OT Stop Time 1145    OT Time Calculation (min) 40 min    Activity Tolerance Patient tolerated treatment well    Behavior During Therapy WFL for tasks assessed/performed          Past Medical History:  Diagnosis Date   Abnormal Pap smear of cervix    pt couldnt state what type of abnormality   Anemia    Arthritis    Carpal tunnel syndrome, bilateral    CHF (congestive heart failure) (HCC)    Chronic migraine    Diabetes mellitus without complication (HCC)    GERD (gastroesophageal reflux disease)    HLD (hyperlipidemia)    Hypertension    Hypokalemia 06/05/2015   Metabolic syndrome 06/05/2015   Morbid obesity with BMI of 50.0-59.9, adult (HCC)    Prediabetes    S/P cardiac catheterization, non obstructive disease 06/04/15 06/05/2015   Sickle cell trait (HCC)    Spondylolisthesis of lumbar region    Syncope    Past Surgical History:  Procedure Laterality Date   BACK SURGERY     x1 lumbar fusion   CARDIAC CATHETERIZATION N/A 06/04/2015   Procedure: Left Heart Cath and Coronary Angiography;  Surgeon: Victory LELON Sharps, MD;  Location: [facility] INVASIVE CV LAB;  Service: Cardiovascular;  Laterality: N/A;   CHOLECYSTECTOMY     laparoscopic   COLONOSCOPY WITH PROPOFOL  N/A 09/04/2014   Procedure: COLONOSCOPY WITH PROPOFOL ;  Surgeon: Lupita FORBES Commander, MD;  Location: [facility] ENDOSCOPY;  Service: Endoscopy;  Laterality: N/A;   LEFT HEART CATHETERIZATION WITH CORONARY ANGIOGRAM N/A 03/08/2012   Procedure: LEFT HEART CATHETERIZATION WITH CORONARY ANGIOGRAM;   Surgeon: Debby JONETTA Como, MD;  Location: [facility] CATH LAB;  Service: Cardiovascular;  Laterality: N/A;   SPINE SURGERY     fusion   TUBAL LIGATION     Patient Active Problem List   Diagnosis Date Noted   Pain in joint of right hip 04/12/2023   Cervical cancer screening 12/21/2022   Type 2 diabetes mellitus without complication, without long-term current use of insulin  (HCC) 08/04/2022   Medial orbital wall fracture, closed, initial encounter (HCC) 10/03/2021   Bilateral carpal tunnel syndrome 09/22/2021   Rheumatoid factor positive 09/22/2021   Bilateral hand swelling 09/22/2021   Spondylolisthesis 05/06/2021   Uncontrolled type 2 diabetes mellitus with microalbuminuria 03/28/2021   Foot-drop 01/04/2021   Status post lumbar spinal fusion 02/12/2020   Spondylolisthesis of lumbar region 02/05/2020   Arthropathy of lumbar facet joint 12/11/2019   Chronic right-sided low back pain with right-sided sciatica 11/04/2019   Other chronic pain 11/04/2019   DDD (degenerative disc disease), lumbar 11/04/2019   Essential hypertension 11/04/2019   Body mass index (BMI) 50.0-59.9, adult (HCC) 11/04/2019   Spinal stenosis of lumbar region 11/04/2019   Spondylolisthesis, lumbar region 11/04/2019   Vitamin D  deficiency 09/10/2019   CHF (congestive heart failure) (HCC) 01/30/2018   OSA (obstructive sleep apnea) 03/01/2017   ARF (acute renal failure) (HCC) 02/28/2017   Hyponatremia 02/28/2017  Syncope 07/23/2016   Syncope and collapse 07/23/2016   S/P cardiac catheterization, non obstructive disease 06/04/15 06/05/2015   CAD in native artery 06/05/2015   Fever 06/05/2015   Hypokalemia 06/05/2015   Metabolic syndrome 06/05/2015   Chest pain, neg MI, possible GI 06/04/2015   Colon cancer screening 08/05/2014   Severe obesity (BMI >= 40) (HCC) 08/05/2014   Health care maintenance 05/13/2014   Prediabetes 05/13/2014   Chronic migraine 04/27/2014   Atypical chest pain 04/27/2014   SOB (shortness of  breath) 09/16/2013   Palpitations 09/16/2013   HTN (hypertension) 03/09/2012   Hyperlipidemia 03/09/2012   Microcytic anemia    Morbid obesity with BMI of 50.0-59.9, adult (HCC)    Precordial chest pain 03/06/2012   Dizziness 03/06/2012    ONSET DATE: 12/27/2023 (referral date) s/p L carpal tunnel release 10/08/23;  R  carpal tunnel release 12/13/2023  REFERRING DIAG: Z98.890 (ICD-10-CM) - S/P carpal tunnel release   THERAPY DIAG:  Muscle weakness (generalized)  Other lack of coordination  Scar condition and fibrosis of skin  Other disturbances of skin sensation  Other symptoms and signs involving the nervous system  Rationale for Evaluation and Treatment: Rehabilitation  SUBJECTIVE:   SUBJECTIVE STATEMENT: She fell checking in to therapy today. No apparent injuries.  Pt accompanied by: self  PERTINENT HISTORY: s/p Lt carpal tunnel release, B carpal tunnel, s/p PLIF L2-3 and L3-4 7/22 due to chronic spinal stenosis with radiculopathy and neurogenic claudication. PMH includes L4-5 decompression and fusion, CAD, CHF, DM II, HLD, HTN, morbid obesity   PRECAUTIONS: activities as tolerated  RED FLAGS: None   WEIGHT BEARING RESTRICTIONS: No  PAIN:  Are you having pain? Yes: NPRS scale: at rest 5/10 Pain location: R wrist Pain description: sore Aggravating factors: playing the tambourine Relieving factors: rest Worst pain in last 24 hours: 7/10  FALLS: Has patient fallen in last 6 months? Yes. Number of falls 1  LIVING ENVIRONMENT: Lives with: lives alone Lives in: House/apartment Stairs: Yes: External: 1 steps; grab bar Has following equipment at home: Single point cane, Walker - 2 wheeled, and shower chair  PLOF: Independent with basic ADLs, IADLs, on disability  PATIENT GOALS: Improved pain and functional use  NEXT MD VISIT: 03/10/2024   OBJECTIVE:  Note: Objective measures were completed at Evaluation unless otherwise noted.  HAND DOMINANCE:  Right  ADLs: mostly mod I  Upper body dressing: ind, difficulty with buttons; puts fastened bra overhead Lower body dressing: ind, doesn't unfasten pants   Handwriting - getting better per pt  FUNCTIONAL OUTCOME MEASURES: Quick Dash: 29.5 % deficit using the R hand    UPPER EXTREMITY ROM:     Active ROM Right eval Left eval  Shoulder flexion [facility]  Shoulder abduction    Shoulder adduction    Shoulder extension    Shoulder internal rotation    Shoulder external rotation    Elbow flexion    Elbow extension    Wrist flexion    Wrist extension    Wrist ulnar deviation    Wrist radial deviation    Wrist pronation    Wrist supination    (Blank rows = not tested)  HAND FUNCTION: Grip strength: Right: 53.5, 51.8, 48.9 (51.4 lbs average) lbs; Left: 15.4 lbs (prior episode) 02/06/2024: Right - 35.4 lbs ; Left - 44.3 lbs  COORDINATION: 9 Hole Peg test: Right: 25 sec; Left: 21 sec (prior episode)  02/06/2024:  Right - 32 sec; Left - 23 sec  SENSATION: Paresthesias  reported B though improved since CTRs  Pt has splints for both hands.   EDEMA: mild edema of LUE  COGNITION: Overall cognitive status: Within functional limits for tasks assessed  OBSERVATIONS: Pt ambulates with use of SPC. No loss of balance though altered gait. The pt appears well kept and has glasses donned.   OT assessed RUE scar site: Noted loose, dead skin at B edges of scar peeling back though scar ultimately healed well, new pink skin underneath dead skin, no s/s of infection. OT noted tightness of RUE scar. ?Presence of stitch  TREATMENT:                                                                                                                            Performed dynamic cupping at site of incision and surrounding area to decrease scar tissue adhesions by mobilizing the soft- tissues such as skin, fascia, neural tissues, muscles, ligaments and tendons and to promote local circulation necessary  for optimal functional movement by lifting and separating the tissue underneath the cup. Improved appearance to incision noted upon completion with less palpable adhesions.   Pt placed BUE in Fluidotherapy machine with supervised ROM x 10 min. Pt was educated to complete ROM during modality time to improve ROM and decrease pain/stiffness of affected extremity by use of the machine's massaging action and thermal properties.    PATIENT EDUCATION: Education details: see today's tx above Person educated: Patient Education method: Explanation Education comprehension: verbalized understanding  HOME EXERCISE PROGRAM: 11/08/23 - scar massage (see pt instructions)  GOALS: Goals reviewed with patient? Yes  SHORT TERM GOALS: Target date: 03/07/2024   Pt will be ind with HEP using visual handouts. Baseline: new to outpt OT Goal status: INITIAL  2.  Pt will ind recall at least 3 joint protection strategies. Baseline:  Goal status: INITIAL  3.  Pt will return demo of scar massage and edema management techniques. Baseline:  Goal status: INITIAL  4.  Pt will independently recall the 5 main sensory precautions (cold, heat, sharp, chemical, and heavy) as needed to prevent injury/harm secondary to impairments.   Baseline:  Goal status: INITIAL  LONG TERM GOALS: Target date: 03/21/24  Patient will demonstrate updated RUE HEP with visual handouts only for proper execution. Baseline:  Goal status: INITIAL  2.  Patient will demonstrate at least 45 lbs RUE grip strength as needed to open jars and other containers.  Baseline: 11/08/2023 -Right: 53.5, 51.8, 48.9 (51.4 lbs average) lbs; Left: 15.4 lbs Eval 02/06/2024: Right - 35.4 lbs ; Left - 44.3 lbs Goal status: INITIAL  3.  Patient will report no more than mild difficulty carrying a shopping bag or briefcase in the RUE.  Baseline: unable Goal status: INITIAL  4.  Patient will demo improved FM coordination as evidenced by completing nine-hole  peg with use of R in 28 seconds or less. Baseline: 11/08/2023 - Right: 25 sec; Left: 21 sec Eval 02/06/2024:  Right -  32 sec; Left - 23 sec Goal status: INITIAL  ASSESSMENT:  CLINICAL IMPRESSION: Pt's scar remains hypertrophic and tight with moderate adhesions. Will continue scar management efforts. Appears to respond well  PERFORMANCE DEFICITS: in functional skills including ADLs, IADLs, coordination, dexterity, proprioception, sensation, edema, ROM, strength, flexibility, Fine motor control, Gross motor control, mobility, balance, body mechanics, endurance, and UE functional use, cognitive skills including energy/drive, and psychosocial skills including environmental adaptation.   IMPAIRMENTS: are limiting patient from ADLs, IADLs, rest and sleep, leisure, and social participation.   COMORBIDITIES: may have co-morbidities  that affects occupational performance. Patient will benefit from skilled OT to address above impairments and improve overall function.  REHAB POTENTIAL: Good  PLAN:  OT FREQUENCY: 1xweek   OT DURATION: 6 weeks  PLANNED INTERVENTIONS: 97168 OT Re-evaluation, 97535 self care/ADL training, 02889 therapeutic exercise, 97530 therapeutic activity, 97112 neuromuscular re-education, 97140 manual therapy, 97035 ultrasound, 97018 paraffin, 02960 fluidotherapy, 97010 moist heat, 97010 cryotherapy, 97760 Orthotic Initial, 97761 Prosthetic Initial, 97763 Orthotic/Prosthetic subsequent, passive range of motion, functional mobility training, energy conservation, patient/family education, and DME and/or AE instructions  RECOMMENDED OTHER SERVICES: N/A  CONSULTED AND AGREED WITH PLAN OF CARE: Patient  PLAN FOR NEXT SESSION:  Per [place]  Hand Protocol 5th Edition  HEP: review tendon glides and Scar massage Assess scar site  Review from prior therapy episode:  Joint protection  A/E and adaptive strategies Sensory safety precautions  Jocelyn CHRISTELLA Bottom, OT 02/19/2024, 1:36 PM

## 2024-02-25 ENCOUNTER — Other Ambulatory Visit: Payer: Self-pay | Admitting: Nurse Practitioner

## 2024-02-25 DIAGNOSIS — Z1231 Encounter for screening mammogram for malignant neoplasm of breast: Secondary | ICD-10-CM

## 2024-02-26 ENCOUNTER — Ambulatory Visit
Admission: RE | Admit: 2024-02-26 | Discharge: 2024-02-26 | Disposition: A | Discharge status: Home / Self Care | Source: Ambulatory Visit | Attending: Nurse Practitioner | Admitting: Nurse Practitioner

## 2024-02-26 ENCOUNTER — Ambulatory Visit: Admitting: Occupational Therapy

## 2024-02-26 DIAGNOSIS — Z1231 Encounter for screening mammogram for malignant neoplasm of breast: Secondary | ICD-10-CM

## 2024-03-04 ENCOUNTER — Ambulatory Visit: Admitting: Occupational Therapy

## 2024-03-04 DIAGNOSIS — R208 Other disturbances of skin sensation: Secondary | ICD-10-CM

## 2024-03-04 DIAGNOSIS — R29818 Other symptoms and signs involving the nervous system: Secondary | ICD-10-CM

## 2024-03-04 DIAGNOSIS — L905 Scar conditions and fibrosis of skin: Secondary | ICD-10-CM

## 2024-03-04 DIAGNOSIS — M6281 Muscle weakness (generalized): Secondary | ICD-10-CM | POA: Diagnosis not present

## 2024-03-04 DIAGNOSIS — R278 Other lack of coordination: Secondary | ICD-10-CM

## 2024-03-04 NOTE — Therapy (Unsigned)
 OUTPATIENT OCCUPATIONAL THERAPY ORTHO TREATMENT  Patient Name: Hannah Huerta MRN: [id number] DOB:[birth year], 63 y.o., female Today's Date: 03/04/2024  PCP: Oley Bascom GORMAN LOISE  REFERRING PROVIDER: Arlinda Buster, MD   END OF SESSION:  OT End of Session - 03/04/24 1111     Visit Number 4    Number of Visits 7    Date for OT Re-Evaluation 03/21/24    Authorization Type [employer] Dual 2025, Covered 100%, VL: [place]    OT Start Time 1110    OT Stop Time 1148    OT Time Calculation (min) 38 min    Activity Tolerance Patient tolerated treatment well    Behavior During Therapy WFL for tasks assessed/performed          Past Medical History:  Diagnosis Date   Abnormal Pap smear of cervix    pt couldnt state what type of abnormality   Anemia    Arthritis    Carpal tunnel syndrome, bilateral    CHF (congestive heart failure) (HCC)    Chronic migraine    Diabetes mellitus without complication (HCC)    GERD (gastroesophageal reflux disease)    HLD (hyperlipidemia)    Hypertension    Hypokalemia 06/05/2015   Metabolic syndrome 06/05/2015   Morbid obesity with BMI of 50.0-59.9, adult (HCC)    Prediabetes    S/P cardiac catheterization, non obstructive disease 06/04/15 06/05/2015   Sickle cell trait (HCC)    Spondylolisthesis of lumbar region    Syncope    Past Surgical History:  Procedure Laterality Date   BACK SURGERY     x1 lumbar fusion   CARDIAC CATHETERIZATION N/A 06/04/2015   Procedure: Left Heart Cath and Coronary Angiography;  Surgeon: Victory LELON Sharps, MD;  Location: [facility] INVASIVE CV LAB;  Service: Cardiovascular;  Laterality: N/A;   CHOLECYSTECTOMY     laparoscopic   COLONOSCOPY WITH PROPOFOL  N/A 09/04/2014   Procedure: COLONOSCOPY WITH PROPOFOL ;  Surgeon: Lupita FORBES Commander, MD;  Location: [facility] ENDOSCOPY;  Service: Endoscopy;  Laterality: N/A;   LEFT HEART CATHETERIZATION WITH CORONARY ANGIOGRAM N/A 03/08/2012   Procedure: LEFT HEART CATHETERIZATION WITH CORONARY ANGIOGRAM;   Surgeon: Debby JONETTA Como, MD;  Location: [facility] CATH LAB;  Service: Cardiovascular;  Laterality: N/A;   SPINE SURGERY     fusion   TUBAL LIGATION     Patient Active Problem List   Diagnosis Date Noted   Pain in joint of right hip 04/12/2023   Cervical cancer screening 12/21/2022   Type 2 diabetes mellitus without complication, without long-term current use of insulin  (HCC) 08/04/2022   Medial orbital wall fracture, closed, initial encounter (HCC) 10/03/2021   Bilateral carpal tunnel syndrome 09/22/2021   Rheumatoid factor positive 09/22/2021   Bilateral hand swelling 09/22/2021   Spondylolisthesis 05/06/2021   Uncontrolled type 2 diabetes mellitus with microalbuminuria 03/28/2021   Foot-drop 01/04/2021   Status post lumbar spinal fusion 02/12/2020   Spondylolisthesis of lumbar region 02/05/2020   Arthropathy of lumbar facet joint 12/11/2019   Chronic right-sided low back pain with right-sided sciatica 11/04/2019   Other chronic pain 11/04/2019   DDD (degenerative disc disease), lumbar 11/04/2019   Essential hypertension 11/04/2019   Body mass index (BMI) 50.0-59.9, adult (HCC) 11/04/2019   Spinal stenosis of lumbar region 11/04/2019   Spondylolisthesis, lumbar region 11/04/2019   Vitamin D  deficiency 09/10/2019   CHF (congestive heart failure) (HCC) 01/30/2018   OSA (obstructive sleep apnea) 03/01/2017   ARF (acute renal failure) (HCC) 02/28/2017   Hyponatremia 02/28/2017  Syncope 07/23/2016   Syncope and collapse 07/23/2016   S/P cardiac catheterization, non obstructive disease 06/04/15 06/05/2015   CAD in native artery 06/05/2015   Fever 06/05/2015   Hypokalemia 06/05/2015   Metabolic syndrome 06/05/2015   Chest pain, neg MI, possible GI 06/04/2015   Colon cancer screening 08/05/2014   Severe obesity (BMI >= 40) (HCC) 08/05/2014   Health care maintenance 05/13/2014   Prediabetes 05/13/2014   Chronic migraine 04/27/2014   Atypical chest pain 04/27/2014   SOB (shortness of  breath) 09/16/2013   Palpitations 09/16/2013   HTN (hypertension) 03/09/2012   Hyperlipidemia 03/09/2012   Microcytic anemia    Morbid obesity with BMI of 50.0-59.9, adult (HCC)    Precordial chest pain 03/06/2012   Dizziness 03/06/2012    ONSET DATE: 12/27/2023 (referral date) s/p L carpal tunnel release 10/08/23;  R  carpal tunnel release 12/13/2023  REFERRING DIAG: Z98.890 (ICD-10-CM) - S/P carpal tunnel release   THERAPY DIAG:  Muscle weakness (generalized)  Other lack of coordination  Scar condition and fibrosis of skin  Other disturbances of skin sensation  Other symptoms and signs involving the nervous system  Rationale for Evaluation and Treatment: Rehabilitation  SUBJECTIVE:   SUBJECTIVE STATEMENT: She fell checking in to therapy today. No apparent injuries.  Pt accompanied by: self  PERTINENT HISTORY: s/p Lt carpal tunnel release, B carpal tunnel, s/p PLIF L2-3 and L3-4 7/22 due to chronic spinal stenosis with radiculopathy and neurogenic claudication. PMH includes L4-5 decompression and fusion, CAD, CHF, DM II, HLD, HTN, morbid obesity   PRECAUTIONS: activities as tolerated  RED FLAGS: None   WEIGHT BEARING RESTRICTIONS: No  PAIN:  Are you having pain? Yes: NPRS scale: at rest 5/10 Pain location: R wrist Pain description: sore Aggravating factors: playing the tambourine Relieving factors: rest Worst pain in last 24 hours: 7/10  FALLS: Has patient fallen in last 6 months? Yes. Number of falls 1  LIVING ENVIRONMENT: Lives with: lives alone Lives in: House/apartment Stairs: Yes: External: 1 steps; grab bar Has following equipment at home: Single point cane, Walker - 2 wheeled, and shower chair  PLOF: Independent with basic ADLs, IADLs, on disability  PATIENT GOALS: Improved pain and functional use  NEXT MD VISIT: 03/10/2024   OBJECTIVE:  Note: Objective measures were completed at Evaluation unless otherwise noted.  HAND DOMINANCE:  Right  ADLs: mostly mod I  Upper body dressing: ind, difficulty with buttons; puts fastened bra overhead Lower body dressing: ind, doesn't unfasten pants   Handwriting - getting better per pt  FUNCTIONAL OUTCOME MEASURES: Quick Dash: 29.5 % deficit using the R hand    UPPER EXTREMITY ROM:     Active ROM Right eval Left eval  Shoulder flexion [facility]  Shoulder abduction    Shoulder adduction    Shoulder extension    Shoulder internal rotation    Shoulder external rotation    Elbow flexion    Elbow extension    Wrist flexion    Wrist extension    Wrist ulnar deviation    Wrist radial deviation    Wrist pronation    Wrist supination    (Blank rows = not tested)  HAND FUNCTION: Grip strength: Right: 53.5, 51.8, 48.9 (51.4 lbs average) lbs; Left: 15.4 lbs (prior episode) 02/06/2024: Right - 35.4 lbs ; Left - 44.3 lbs  COORDINATION: 9 Hole Peg test: Right: 25 sec; Left: 21 sec (prior episode)  02/06/2024:  Right - 32 sec; Left - 23 sec  SENSATION: Paresthesias  reported B though improved since CTRs  Pt has splints for both hands.   EDEMA: mild edema of LUE  COGNITION: Overall cognitive status: Within functional limits for tasks assessed  OBSERVATIONS: Pt ambulates with use of SPC. No loss of balance though altered gait. The pt appears well kept and has glasses donned.   OT assessed RUE scar site: Noted loose, dead skin at B edges of scar peeling back though scar ultimately healed well, new pink skin underneath dead skin, no s/s of infection. OT noted tightness of RUE scar. ?Presence of stitch  TREATMENT:                                                                                                                            Performed dynamic cupping at site of incision and surrounding area to decrease scar tissue adhesions by mobilizing the soft- tissues such as skin, fascia, neural tissues, muscles, ligaments and tendons and to promote local circulation necessary  for optimal functional movement by lifting and separating the tissue underneath the cup. Improved appearance to incision noted upon completion with less palpable adhesions.   Pt placed BUE in Fluidotherapy machine with supervised ROM x 10 min. Pt was educated to complete ROM during modality time to improve ROM and decrease pain/stiffness of affected extremity by use of the machine's massaging action and thermal properties.    PATIENT EDUCATION: Education details: see today's tx above Person educated: Patient Education method: Explanation Education comprehension: verbalized understanding  HOME EXERCISE PROGRAM: 11/08/23 - scar massage (see pt instructions)  GOALS: Goals reviewed with patient? Yes  SHORT TERM GOALS: Target date: 03/07/2024   Pt will be ind with HEP using visual handouts. Baseline: new to outpt OT Goal status: INITIAL  2.  Pt will ind recall at least 3 joint protection strategies. Baseline:  Goal status: INITIAL  3.  Pt will return demo of scar massage and edema management techniques. Baseline:  Goal status: INITIAL  4.  Pt will independently recall the 5 main sensory precautions (cold, heat, sharp, chemical, and heavy) as needed to prevent injury/harm secondary to impairments.   Baseline:  Goal status: INITIAL  LONG TERM GOALS: Target date: 03/21/24  Patient will demonstrate updated RUE HEP with visual handouts only for proper execution. Baseline:  Goal status: INITIAL  2.  Patient will demonstrate at least 45 lbs RUE grip strength as needed to open jars and other containers.  Baseline: 11/08/2023 -Right: 53.5, 51.8, 48.9 (51.4 lbs average) lbs; Left: 15.4 lbs Eval 02/06/2024: Right - 35.4 lbs ; Left - 44.3 lbs Goal status: INITIAL  3.  Patient will report no more than mild difficulty carrying a shopping bag or briefcase in the RUE.  Baseline: unable Goal status: INITIAL  4.  Patient will demo improved FM coordination as evidenced by completing nine-hole  peg with use of R in 28 seconds or less. Baseline: 11/08/2023 - Right: 25 sec; Left: 21 sec Eval 02/06/2024:  Right -  32 sec; Left - 23 sec Goal status: INITIAL  ASSESSMENT:  CLINICAL IMPRESSION: Pt's scar remains hypertrophic and tight with moderate adhesions. Will continue scar management efforts. Appears to respond well  PERFORMANCE DEFICITS: in functional skills including ADLs, IADLs, coordination, dexterity, proprioception, sensation, edema, ROM, strength, flexibility, Fine motor control, Gross motor control, mobility, balance, body mechanics, endurance, and UE functional use, cognitive skills including energy/drive, and psychosocial skills including environmental adaptation.   IMPAIRMENTS: are limiting patient from ADLs, IADLs, rest and sleep, leisure, and social participation.   COMORBIDITIES: may have co-morbidities  that affects occupational performance. Patient will benefit from skilled OT to address above impairments and improve overall function.  REHAB POTENTIAL: Good  PLAN:  OT FREQUENCY: 1xweek   OT DURATION: 6 weeks  PLANNED INTERVENTIONS: 97168 OT Re-evaluation, 97535 self care/ADL training, 02889 therapeutic exercise, 97530 therapeutic activity, 97112 neuromuscular re-education, 97140 manual therapy, 97035 ultrasound, 97018 paraffin, 02960 fluidotherapy, 97010 moist heat, 97010 cryotherapy, 97760 Orthotic Initial, 97761 Prosthetic Initial, 97763 Orthotic/Prosthetic subsequent, passive range of motion, functional mobility training, energy conservation, patient/family education, and DME and/or AE instructions  RECOMMENDED OTHER SERVICES: N/A  CONSULTED AND AGREED WITH PLAN OF CARE: Patient  PLAN FOR NEXT SESSION:  Per [place]  Hand Protocol 5th Edition  HEP: review tendon glides and Scar massage Assess scar site  Review from prior therapy episode:  Joint protection  A/E and adaptive strategies Sensory safety precautions  Jocelyn CHRISTELLA Bottom, OT 03/04/2024, 11:12 AM

## 2024-03-10 ENCOUNTER — Ambulatory Visit (INDEPENDENT_AMBULATORY_CARE_PROVIDER_SITE_OTHER): Admitting: Orthopedic Surgery

## 2024-03-10 DIAGNOSIS — Z9889 Other specified postprocedural states: Secondary | ICD-10-CM

## 2024-03-10 NOTE — Progress Notes (Signed)
   Hannah Huerta - 63 y.o. female MRN [id number]  Date of birth: [birth year]  Office Visit Note: Visit Date: 03/10/2024 PCP: Jegede, Olugbemiga E, MD Referred by: Jegede, Olugbemiga E, MD  Subjective:  HPI: Hannah Huerta is a 63 y.o. female who presents today for follow up 12 weeks status post Right open carpal tunnel release.  She is doing well overall, did do some aggressive scar massage recently with OT which did cause an increase in pain.  Numbness and tingling continues to improve.  Pertinent ROS were reviewed with the patient and found to be negative unless otherwise specified above in HPI.   Assessment & Plan: Visit Diagnoses:  1. S/P carpal tunnel release     Plan: Continue with activities as tolerated. She is demonstrating appropriate improvement postoperatively. Is comfortable with home exercises at this point.  I did say that she can wean away from the scar massage if it is causing some more pain.  Follow-up: No follow-ups on file.   Meds & Orders: No orders of the defined types were placed in this encounter.  No orders of the defined types were placed in this encounter.    Procedures: No procedures performed       Objective:   Vital Signs: LMP 09/15/2013   Ortho Exam Right hand: - Well-healed palmar incision - Composite fist without restriction - Sensation intact to light touch in median nerve distribution, 2-point discrimination 6 mm - 4+/5 APB mild thenar atrophy  Imaging: No results found.   Finas Delone Afton Alderton, M.D. [facility], Hand Surgery

## 2024-03-11 ENCOUNTER — Ambulatory Visit: Admitting: Occupational Therapy

## 2024-03-11 DIAGNOSIS — R278 Other lack of coordination: Secondary | ICD-10-CM

## 2024-03-11 DIAGNOSIS — L905 Scar conditions and fibrosis of skin: Secondary | ICD-10-CM

## 2024-03-11 DIAGNOSIS — R29818 Other symptoms and signs involving the nervous system: Secondary | ICD-10-CM

## 2024-03-11 DIAGNOSIS — M6281 Muscle weakness (generalized): Secondary | ICD-10-CM

## 2024-03-11 DIAGNOSIS — R208 Other disturbances of skin sensation: Secondary | ICD-10-CM

## 2024-03-11 NOTE — Therapy (Unsigned)
 OUTPATIENT OCCUPATIONAL THERAPY ORTHO TREATMENT  Patient Name: Hannah Huerta MRN: [id number] DOB:1960/09/13, 63 y.o., female Today's Date: 03/11/2024  PCP: Oley Bascom GORMAN LOISE  REFERRING PROVIDER: Arlinda Buster, MD   END OF SESSION:  OT End of Session - 03/11/24 1102     Visit Number 5    Number of Visits 7    Date for OT Re-Evaluation 03/21/24    Authorization Type UHC Dual 2025, Covered 100%, VL: [place]    OT Start Time 1101    OT Stop Time 1144    OT Time Calculation (min) 43 min    Activity Tolerance Patient tolerated treatment well    Behavior During Therapy WFL for tasks assessed/performed          Past Medical History:  Diagnosis Date   Abnormal Pap smear of cervix    pt couldnt state what type of abnormality   Anemia    Arthritis    Carpal tunnel syndrome, bilateral    CHF (congestive heart failure) (HCC)    Chronic migraine    Diabetes mellitus without complication (HCC)    GERD (gastroesophageal reflux disease)    HLD (hyperlipidemia)    Hypertension    Hypokalemia 06/05/2015   Metabolic syndrome 06/05/2015   Morbid obesity with BMI of 50.0-59.9, adult (HCC)    Prediabetes    S/P cardiac catheterization, non obstructive disease 06/04/15 06/05/2015   Sickle cell trait (HCC)    Spondylolisthesis of lumbar region    Syncope    Past Surgical History:  Procedure Laterality Date   BACK SURGERY     x1 lumbar fusion   CARDIAC CATHETERIZATION N/A 06/04/2015   Procedure: Left Heart Cath and Coronary Angiography;  Surgeon: Victory LELON Sharps, MD;  Location: [facility] INVASIVE CV LAB;  Service: Cardiovascular;  Laterality: N/A;   CHOLECYSTECTOMY     laparoscopic   COLONOSCOPY WITH PROPOFOL  N/A 09/04/2014   Procedure: COLONOSCOPY WITH PROPOFOL ;  Surgeon: Lupita FORBES Commander, MD;  Location: [facility] ENDOSCOPY;  Service: Endoscopy;  Laterality: N/A;   LEFT HEART CATHETERIZATION WITH CORONARY ANGIOGRAM N/A 03/08/2012   Procedure: LEFT HEART CATHETERIZATION WITH CORONARY ANGIOGRAM;   Surgeon: Debby JONETTA Como, MD;  Location: [facility] CATH LAB;  Service: Cardiovascular;  Laterality: N/A;   SPINE SURGERY     fusion   TUBAL LIGATION     Patient Active Problem List   Diagnosis Date Noted   Pain in joint of right hip 04/12/2023   Cervical cancer screening 12/21/2022   Type 2 diabetes mellitus without complication, without long-term current use of insulin  (HCC) 08/04/2022   Medial orbital wall fracture, closed, initial encounter (HCC) 10/03/2021   Bilateral carpal tunnel syndrome 09/22/2021   Rheumatoid factor positive 09/22/2021   Bilateral hand swelling 09/22/2021   Spondylolisthesis 05/06/2021   Uncontrolled type 2 diabetes mellitus with microalbuminuria 03/28/2021   Foot-drop 01/04/2021   Status post lumbar spinal fusion 02/12/2020   Spondylolisthesis of lumbar region 02/05/2020   Arthropathy of lumbar facet joint 12/11/2019   Chronic right-sided low back pain with right-sided sciatica 11/04/2019   Other chronic pain 11/04/2019   DDD (degenerative disc disease), lumbar 11/04/2019   Essential hypertension 11/04/2019   Body mass index (BMI) 50.0-59.9, adult (HCC) 11/04/2019   Spinal stenosis of lumbar region 11/04/2019   Spondylolisthesis, lumbar region 11/04/2019   Vitamin D  deficiency 09/10/2019   CHF (congestive heart failure) (HCC) 01/30/2018   OSA (obstructive sleep apnea) 03/01/2017   ARF (acute renal failure) (HCC) 02/28/2017   Hyponatremia 02/28/2017  Syncope 07/23/2016   Syncope and collapse 07/23/2016   S/P cardiac catheterization, non obstructive disease 06/04/15 06/05/2015   CAD in native artery 06/05/2015   Fever 06/05/2015   Hypokalemia 06/05/2015   Metabolic syndrome 06/05/2015   Chest pain, neg MI, possible GI 06/04/2015   Colon cancer screening 08/05/2014   Severe obesity (BMI >= 40) (HCC) 08/05/2014   Health care maintenance 05/13/2014   Prediabetes 05/13/2014   Chronic migraine 04/27/2014   Atypical chest pain 04/27/2014   SOB (shortness of  breath) 09/16/2013   Palpitations 09/16/2013   HTN (hypertension) 03/09/2012   Hyperlipidemia 03/09/2012   Microcytic anemia    Morbid obesity with BMI of 50.0-59.9, adult (HCC)    Precordial chest pain 03/06/2012   Dizziness 03/06/2012    ONSET DATE: 12/27/2023 (referral date) s/p L carpal tunnel release 10/08/23;  R  carpal tunnel release 12/13/2023  REFERRING DIAG: Z98.890 (ICD-10-CM) - S/P carpal tunnel release   THERAPY DIAG:  Muscle weakness (generalized)  Other lack of coordination  Scar condition and fibrosis of skin  Other disturbances of skin sensation  Other symptoms and signs involving the nervous system  Rationale for Evaluation and Treatment: Rehabilitation  SUBJECTIVE:   SUBJECTIVE STATEMENT: She   Pt accompanied by: self  PERTINENT HISTORY: s/p Lt carpal tunnel release, B carpal tunnel, s/p PLIF L2-3 and L3-4 7/22 due to chronic spinal stenosis with radiculopathy and neurogenic claudication. PMH includes L4-5 decompression and fusion, CAD, CHF, DM II, HLD, HTN, morbid obesity   PRECAUTIONS: activities as tolerated  RED FLAGS: None   WEIGHT BEARING RESTRICTIONS: No  PAIN:  Are you having pain? Yes: NPRS scale: at rest 5/10 Pain location: R wrist Pain description: sore Aggravating factors: playing the tambourine Relieving factors: rest Worst pain in last 24 hours: 7/10  FALLS: Has patient fallen in last 6 months? Yes. Number of falls 1  LIVING ENVIRONMENT: Lives with: lives alone Lives in: House/apartment Stairs: Yes: External: 1 steps; grab bar Has following equipment at home: Single point cane, Walker - 2 wheeled, and shower chair  PLOF: Independent with basic ADLs, IADLs, on disability  PATIENT GOALS: Improved pain and functional use  NEXT MD VISIT: 03/10/2024   OBJECTIVE:  Note: Objective measures were completed at Evaluation unless otherwise noted.  HAND DOMINANCE: Right  ADLs: mostly mod I  Upper body dressing: ind, difficulty  with buttons; puts fastened bra overhead Lower body dressing: ind, doesn't unfasten pants   Handwriting - getting better per pt  FUNCTIONAL OUTCOME MEASURES: Quick Dash: 29.5 % deficit using the R hand    UPPER EXTREMITY ROM:     Active ROM Right eval Left eval  Shoulder flexion [facility]  Shoulder abduction    Shoulder adduction    Shoulder extension    Shoulder internal rotation    Shoulder external rotation    Elbow flexion    Elbow extension    Wrist flexion    Wrist extension    Wrist ulnar deviation    Wrist radial deviation    Wrist pronation    Wrist supination    (Blank rows = not tested)  HAND FUNCTION: Grip strength: Right: 53.5, 51.8, 48.9 (51.4 lbs average) lbs; Left: 15.4 lbs (prior episode) 02/06/2024: Right - 35.4 lbs ; Left - 44.3 lbs  COORDINATION: 9 Hole Peg test: Right: 25 sec; Left: 21 sec (prior episode)  02/06/2024:  Right - 32 sec; Left - 23 sec  SENSATION: Paresthesias reported B though improved since CTRs  Pt  has splints for both hands.   EDEMA: mild edema of LUE  COGNITION: Overall cognitive status: Within functional limits for tasks assessed  OBSERVATIONS: Pt ambulates with use of SPC. No loss of balance though altered gait. The pt appears well kept and has glasses donned.   OT assessed RUE scar site: Noted loose, dead skin at B edges of scar peeling back though scar ultimately healed well, new pink skin underneath dead skin, no s/s of infection. OT noted tightness of RUE scar. ?Presence of stitch  TREATMENT:                  Therapist reviewed goals with patient and updated patient progression.               OT educated pt on joint protection, ergonomics, and Optometrist principles as noted in pt instructions as needed to improve UE pain.  OT educated patient in Safety considerations for loss of sensation as noted in patient instructions to reduce risk of injury to affected hand.  Pt encouraged to be careful of sharp, hot/cold  (check temperatures of water), breakable, heavy objects and chemicals. Patient verbalized understanding. Handout provided.   OT applied KinesioTape over incision using technique for scar tissue management. Provided instruction for removal as needed.   PATIENT EDUCATION: Education details: see today's tx above Person educated: Patient Education method: Explanation, Demonstration, Verbal cues, and Handouts Education comprehension: verbalized understanding  HOME EXERCISE PROGRAM: 11/08/23 - scar massage (see pt instructions) 03/11/2024: joint protection, ergonomics, and body mechanic principles; scar massage; sensory precautions  GOALS: Goals reviewed with patient? Yes  SHORT TERM GOALS: Target date: 03/07/2024   Pt will be ind with HEP using visual handouts. Baseline: new to outpt OT Goal status: IN PROGRESS  2.  Pt will ind recall at least 3 joint protection strategies. Baseline:  03/11/2024: able to recall 2 with increased time Goal status: IN PROGRESS  3.  Pt will return demo of scar massage and edema management techniques. Baseline:  Goal status: IN PROGRESS  4.  Pt will independently recall the 5 main sensory precautions (cold, heat, sharp, chemical, and heavy) as needed to prevent injury/harm secondary to impairments.   Baseline:  Goal status: IN PROGRESS  LONG TERM GOALS: Target date: 03/21/24  Patient will demonstrate updated RUE HEP with visual handouts only for proper execution. Baseline:  Goal status: INITIAL  2.  Patient will demonstrate at least 45 lbs RUE grip strength as needed to open jars and other containers.  Baseline: 11/08/2023 -Right: 53.5, 51.8, 48.9 (51.4 lbs average) lbs; Left: 15.4 lbs Eval 02/06/2024: Right - 35.4 lbs ; Left - 44.3 lbs Goal status: INITIAL  3.  Patient will report no more than mild difficulty carrying a shopping bag or briefcase in the RUE.  Baseline: unable Goal status: INITIAL  4.  Patient will demo improved FM coordination as  evidenced by completing nine-hole peg with use of R in 28 seconds or less. Baseline: 11/08/2023 - Right: 25 sec; Left: 21 sec Eval 02/06/2024:  Right - 32 sec; Left - 23 sec Goal status: INITIAL  ASSESSMENT:  CLINICAL IMPRESSION: Pt verbalizes good understanding of education provided today as needed to progress towards goals. Will assess benefit of  kinesiotape and goal progression at next session.  PERFORMANCE DEFICITS: in functional skills including ADLs, IADLs, coordination, dexterity, proprioception, sensation, edema, ROM, strength, flexibility, Fine motor control, Gross motor control, mobility, balance, body mechanics, endurance, and UE functional use, cognitive skills including energy/drive, and  psychosocial skills including environmental adaptation.   IMPAIRMENTS: are limiting patient from ADLs, IADLs, rest and sleep, leisure, and social participation.   COMORBIDITIES: may have co-morbidities  that affects occupational performance. Patient will benefit from skilled OT to address above impairments and improve overall function.  REHAB POTENTIAL: Good  PLAN:  OT FREQUENCY: 1xweek   OT DURATION: 6 weeks  PLANNED INTERVENTIONS: 97168 OT Re-evaluation, 97535 self care/ADL training, 02889 therapeutic exercise, 97530 therapeutic activity, 97112 neuromuscular re-education, 97140 manual therapy, 97035 ultrasound, 97018 paraffin, 02960 fluidotherapy, 97010 moist heat, 97010 cryotherapy, 97760 Orthotic Initial, 97761 Prosthetic Initial, 97763 Orthotic/Prosthetic subsequent, passive range of motion, functional mobility training, energy conservation, patient/family education, and DME and/or AE instructions  RECOMMENDED OTHER SERVICES: N/A  CONSULTED AND AGREED WITH PLAN OF CARE: Patient  PLAN FOR NEXT SESSION: Update goal progression How did KT Tape go?  Per [place]  Hand Protocol 5th Edition  HEP: review tendon glides and Scar massage Assess scar site  Review from prior therapy episode:   Joint protection  A/E and adaptive strategies Sensory safety precautions  Jocelyn CHRISTELLA Bottom, OT 03/11/2024, 1:16 PM

## 2024-03-11 NOTE — Patient Instructions (Addendum)
 K96775 (8/07) - Page 1 of 6 [facility]  Rehabilitation and Sports Medicine Services Joint Protection Principles Therapist Phone  Joint protection principles are a series of techniques which can be included into all activities. This will reduce the stress on your joints. Joints that have been weakened by arthritis are at risk of being damaged by stress and strain. Improper use of diseased joints may lead to impaired function and deformity. Joint protection techniques are ways of doing activities so that the risk of deformity is decreased. 1. Respect For Pain Stop activities before you reach the point of discomfort or pain. Limit activities which cause your pain to last more than one hour after you have stopped the activity. 2. Balance Activity And Rest Rest before becoming tired. Plan rest periods during longer or more difficult activities. By resting 10 minutes during an activity, you will have more energy to continue. 3. Avoid Activities Which Cannot Be Stopped When you begin to feel joint pain, stop. This will eliminate excessive pain and fatigue later. Prioritize activities. Consider the activity, length of time, and difficulty before beginning. Plan difficult activities for peak energy times. 4. Use Larger, Stronger Joints For Activities, When Possible, Distributing The Weight Over Non-involved Or Stronger Joints.  To lift a bag from a counter, bend your knees, hug the bag with both arms. Bend your elbows so that the bag is held tightly to your chest and straighten your knees. Keep hold on the bag by keeping your elbows bent. If the load is too heavy, push shopping cart, or get help with groceries - use drive-up service.  You can use your hip to push open doors, and your feet to close lower drawers. OVER  K96775 (8/07) - Page 2 of 6  An envelope briefcase with a snap lock can be used rather than an attache case. By bending the elbows, the case can be carried under the arm so  that the case rests on the forearm. Hold the case by resting your arm against your body. Switch the case from one side of the body to the other.  Use the larger joints (elbow or shoulder) to carry the weight of the purse.  Wrong: The weight of the purse is all on the weak fingers.  Wing faucets: Keeping wrists extended, use the palm of your left hand to turn on a left faucet and the back of your left hand to turn the faucet off.  Four-pronged or circular faucets: Place palm of hand on top of the faucet keeping fingers extended. Straighten your elbow and apply a downward force on faucet, pushing from your shoulder. Keeping fingers and elbow extended, turn your arm inward toward your thumb. Right: The stronger elbow should carry the weight of the purse. CONTINUED ON NEXT PAGE K96775 (8/07) - Page 3 of 6  Wrong: Do not use fingers to lift heavy roasting pans or dishes.  Wrong: All the weight of the pot would on your weak fingers. 5. Avoid Staying In One Position For Extended Periods Of Time. Plan rest periods. Change your position. Stretch and relax your joints. 6. Maintain Or Use Your Joints In Good Alignment. Maintain proper posture.  This is good alignment. Right: Use oven mitts and lift with palms, using the stronger wrists and elbows to do the work. Right: Pick up the pot with two hands, using your palms. Avoid or change activities that cause your fingers to move towards the little finger side of your hand. OVER K96775 (  8/07) - Page 4 of 6  Use the palms of your hands for lifting and pushing. Push instead of pulling. 7. Maintain Proper Weight. Additional weight can stress weight-bearing joints (hip, knees, feet, back). Special Considerations For The Hands  1. AVOID TIGHT GRASP. Use a relaxed grip. Enlarge handles.  Place palm of hand on jar lid, and using weight if body, turn arm at shoulder to open jar. A sponge or wet towel under the jar prevents sliding.  2. AVOID PRESSURE ON  BACK OF KNUCKLES (MP JOINTS).  Wrong Right When rising from chair or bed. Dishwashing should be done with fingers kept straight as much as possible. It a dishwasher is available, use it in preference to washing by hand. Hold the knife or mixing spoon like a dagger, with the handle parallel to knuckles. Cutting is then changed from sawing to pulling. CONTINUED ON NEXT PAGE K96775 (8/07) - Page 5 of 6  3. USE BOTH HANDS WHEN POSSIBLE  4. AVOID REPETITIVE HAND ACTIVITIES Take breaks Change activity, i.e. using screwdriver, crocheting.  5. AVOID PRESSURE TO TIP OF THUMB Example: pushing snaps together, opening car doors, ringing doorbells.  To protect thumb joints, open milk containers with heels of the hands rather than thumbs. PostureWhether walking, standing, sitting or even sleeping, good posture is important for people with arthritis. Poor posture can make arthritis worse. As for standing, you should stand straight, head high, shoulders back, stomach in, and hips and knees straight. WalkingWalk erect, as in standing position. Arm swing freely at sides; let your weight shift easily from side to the other. Don't carry heavy packages in one hand. A lightweight shoulder bag is a good idea. If legs or knees are involved, a cane will make walking easier. Ring top cans: Hold the can with one hand. With the other hand, place a knife through the ring with handle of knife directly over the opening. Using the palm of your hand, push down on the handle of the knife. OVER K96775 (8/07) - Page 6 of 6  Resting/SleepingPatients with rheumatoid arthritis should avoid bent knees or arms. Lie straight at sides, knees and hips straight. Use a firm mattress or put plywood board between mattress and bedspring. If you need a pillow under your head, use a thin one. Keep sheets and blankets loose over your feet, perhaps by using a blanket support. If your arthritis is in your back, you may need a different position for  sleeping. Ask your doctor. SittingKeep good posture when sitting down. Use straight-back armchairs with firm seats. Sit with head up, shoulders back, stomach in, feet flat on floor. Use arms of chair to stand up slowly. References AOTA's, Workbook for Consumers with R.A.   Cordery, Zada Nickels, M.A.O.T., OTR, Joint Protection - A Responsibility for the Occupational Therapist, American Journal of Occupational Therapy, XIX, %, 1965.   Joint Protection, Occupational Therapy Department, [facility], [place], [place] .   Seena Seta: Rheumatic Disease Occupational Therapy and Rehab, [phone].   Principles of Joint Protection, Occupational Therapy Department, [facility], [place], [place] .   [employer], 8021.   Koleen Gearing, OTR, and Robinson, Dianne, OTR, Joint Preservation Techniques for Patients with Rheumatoid Arthritis, Department of Occupational Therapy, [facility], [place], [place] .  Safety considerations for loss of sensation:   Look at affected hand when using it!   Do NOT use affected arm for anything: sharp, hot, breakable, or too heavy  Always check temperature of  water (for showering, washing dishes, etc) with UNaffected arm/extremity  Consider travel mugs w/ lids to transport hot liquids/coffee  Consider alternative options and/or adaptive equipment to make things safer (ex: hand chopper or cut resistant glove for chopping vegetables)   Avoid cold temperatures as well (wear glove in cold temperatures, get ice w/ unaffected extremity)  AVOID handling chemicals and machinery

## 2024-03-19 ENCOUNTER — Encounter: Admitting: Occupational Therapy

## 2024-03-25 NOTE — Telephone Encounter (Signed)
 Attempted to contact patient and left message to advise patient to call the office and schedule appointment.

## 2024-03-25 NOTE — Telephone Encounter (Signed)
-----   Message from Reena Stark sent at 03/25/2024  8:12 AM EDT ----- Regarding: FW: Follow up Please schedule f/u appt. Thank you. ----- Message ----- From: Jeannetta Lonni ORN, MD Sent: 03/25/2024   8:06 AM EDT To: [facility]-Rheumatology Clinical Subject: Follow up                                      We saw Ms. Koplin previously for bilateral hand pain and ultimately found to have severe carpal tunnel syndrome which was now treated by orthopedics. She also had high positive markers for rheumatoid arthritis and I would recommend we follow up sometime to recheck her now that the CTS is treated.

## 2024-03-27 NOTE — Progress Notes (Unsigned)
 Cardiology Office Note   Date:  03/28/2024   ID:  Hannah Huerta, DOB [birth year], MRN [id number]  PCP:  Oley Bascom RAMAN, NP  Cardiologist:   None Referring:  Oley Bascom RAMAN, NP   Chief Complaint  Patient presents with   Chest Pain      History of Present Illness: Hannah Huerta is a 63 y.o. female who presents for evaluation of chest pain and she was in the emergency room.  This was actually in February. She reports that she was having some that was midline and lower chest.  It was not radiating.  It was happening at rest.  It was not positional.  There was no associated symptoms.  Chest x-ray was unremarkable.  She does have some aortic atherosclerosis.  Enzymes were unremarkable.  EKG demonstrated no acute changes.  She since had no further chest discomfort.  She gets around with a cane because she has foot drop related to her back trouble that she never had addressed.   She looks after grandchildren occasionally.  She gets on does her grocery shopping.  She lives alone.  She is not having any new shortness of breath, PND or orthopnea.  She has had a little bit of a cough with wheezing because her grandchildren got her sick.  She was seen in 2016 in our clinic.  She has had a history of nonobstructive coronary artery disease.  She had some chest pain and ultimately underwent a cardiac cath in 2016 that demonstrated mild irregularities in the LAD, heavily calcified small D2 not suitable for PCI.  I see an echocardiogram in 2018 that demonstrated well-preserved ejection fraction with an EF of 55 to 60%.  She had moderate LVH.     Past Medical History:  Diagnosis Date   Abnormal Pap smear of cervix    pt couldnt state what type of abnormality   Anemia    Arthritis    Carpal tunnel syndrome, bilateral    CHF (congestive heart failure) (HCC)    Chronic migraine    Diabetes mellitus without complication (HCC)    GERD (gastroesophageal reflux disease)    HLD (hyperlipidemia)     Hypertension    Hypokalemia 06/05/2015   Metabolic syndrome 06/05/2015   Morbid obesity with BMI of 50.0-59.9, adult (HCC)    Prediabetes    S/P cardiac catheterization, non obstructive disease 06/04/15 06/05/2015   Sickle cell trait (HCC)    Spondylolisthesis of lumbar region    Syncope     Past Surgical History:  Procedure Laterality Date   BACK SURGERY     x1 lumbar fusion   CARDIAC CATHETERIZATION N/A 06/04/2015   Procedure: Left Heart Cath and Coronary Angiography;  Surgeon: Victory LELON Sharps, MD;  Location: [facility] INVASIVE CV LAB;  Service: Cardiovascular;  Laterality: N/A;   CHOLECYSTECTOMY     laparoscopic   COLONOSCOPY WITH PROPOFOL  N/A 09/04/2014   Procedure: COLONOSCOPY WITH PROPOFOL ;  Surgeon: Lupita FORBES Commander, MD;  Location: [facility] ENDOSCOPY;  Service: Endoscopy;  Laterality: N/A;   LEFT HEART CATHETERIZATION WITH CORONARY ANGIOGRAM N/A 03/08/2012   Procedure: LEFT HEART CATHETERIZATION WITH CORONARY ANGIOGRAM;  Surgeon: Debby JONETTA Como, MD;  Location: [facility] CATH LAB;  Service: Cardiovascular;  Laterality: N/A;   SPINE SURGERY     fusion   TUBAL LIGATION       Current Outpatient Medications  Medication Sig Dispense Refill   acetaminophen  (TYLENOL ) 325 MG tablet Take 2 tablets (650 mg total) by mouth every  6 (six) hours as needed. 36 tablet 0   amLODipine  (NORVASC ) 5 MG tablet TAKE 2 TABLETS(10 MG) BY MOUTH DAILY 180 tablet 1   aspirin  EC 81 MG tablet Take 1 tablet (81 mg total) by mouth daily. 30 tablet 11   atorvastatin  (LIPITOR ) 40 MG tablet Take 1 tablet (40 mg total) by mouth daily. 90 tablet 2   carvedilol  (COREG ) 12.5 MG tablet TAKE 1 TABLET(12.5 MG) BY MOUTH TWICE DAILY WITH A MEAL 180 tablet 0   Iron  Combinations (CHROMAGEN) capsule Take 1 capsule by mouth daily. 65 mg     metFORMIN  (GLUCOPHAGE ) 500 MG tablet Take 1 tablet (500 mg total) by mouth 2 (two) times daily with a meal. 90 tablet 3   spironolactone  (ALDACTONE ) 25 MG tablet Take 1 tablet (25 mg total) by mouth daily.  90 tablet 3   triamcinolone  cream (KENALOG ) 0.1 % Apply 1 Application topically 2 (two) times daily. 30 g 0   ibuprofen  (ADVIL ) 600 MG tablet Take 1 tablet (600 mg total) by mouth every 6 (six) hours as needed. (Patient not taking: Reported on 03/28/2024) 30 tablet 0   lidocaine  (LIDODERM ) 5 % Place 1 patch onto the skin daily as needed. Remove & Discard patch within 12 hours or as directed by MD (Patient not taking: Reported on 03/28/2024) 15 patch 0   naproxen  (NAPROSYN ) 375 MG tablet Take 1 tablet (375 mg total) by mouth 2 (two) times daily. (Patient not taking: Reported on 03/28/2024) 20 tablet 0   Replens Vaginal Moisturizer GEL Place 1 Application vaginally every 3 (three) days. (Patient not taking: Reported on 03/28/2024) 6.7 g 2   No current facility-administered medications for this visit.    Allergies:   Patient has no known allergies.    Social History:  The patient  reports that she has never smoked. She has been exposed to tobacco smoke. She has never used smokeless tobacco. She reports that she does not drink alcohol and does not use drugs.   Family History:  The patient's family history includes Breast cancer in her maternal aunt; Breast cancer (age of onset: 50) in her sister; Colon polyps in her sister; Coronary artery disease (age of onset: 23) in her father; Diabetes in her mother; Heart disease in her father; Hypertension in her father, mother, and sister; Other in her father; Pancreatic cancer in her maternal aunt; Stroke in her paternal grandmother; Sudden death (age of onset: 36) in her mother.    ROS:  Please see the history of present illness.   Otherwise, review of systems are positive for none.   All other systems are reviewed and negative.    PHYSICAL EXAM: VS:  BP (!) 152/86   Pulse 69   Ht 5' 0.5 (1.537 m)   Wt 231 lb 6.4 oz (105 kg)   LMP 09/15/2013   SpO2 96%   BMI 44.45 kg/m  , BMI Body mass index is 44.45 kg/m. GENERAL:  Well appearing HEENT:  Pupils  equal round and reactive, fundi not visualized, oral mucosa unremarkable NECK:  No jugular venous distention, waveform within normal limits, carotid upstroke brisk and symmetric, no bruits, no thyromegaly LYMPHATICS:  No cervical, inguinal adenopathy LUNGS:   Scattered wheezes BACK:  No CVA tenderness CHEST:  Unremarkable HEART:  PMI not displaced or sustained,S1 and S2 within normal limits, no S3, no S4, no clicks, no rubs, no murmurs ABD:  Flat, positive bowel sounds normal in frequency in pitch, no bruits, no rebound, no guarding, no  midline pulsatile mass, no hepatomegaly, no splenomegaly EXT:  2 plus pulses throughout, no edema, no cyanosis no clubbing SKIN:  No rashes no nodules NEURO:  Cranial nerves II through XII grossly intact, motor grossly intact throughout [facility]:  Cognitively intact, oriented to person place and time    EKG:      08/19/2023 sinus rhythm, rate 75, left axis deviation, poor anterior R wave progression, low voltage in chest leads, no acute ST-T wave changes.    Recent Labs: 08/17/2023: ALT 9 11/15/2023: BUN 10; Creatinine, Ser 0.89; Potassium 4.3; Sodium 140 02/15/2024: Hemoglobin 10.8; Platelets 289    Lipid Panel    Component Value Date/Time   CHOL 130 02/15/2024 0833   TRIG 53 02/15/2024 0833   HDL 54 02/15/2024 0833   CHOLHDL 2.4 02/15/2024 0833   CHOLHDL 4.3 03/07/2017 1109   VLDL 25 03/07/2017 1109   LDLCALC 64 02/15/2024 0833      Wt Readings from Last 3 Encounters:  03/28/24 231 lb 6.4 oz (105 kg)  02/15/24 232 lb 9.6 oz (105.5 kg)  12/06/23 234 lb (106.1 kg)      Other studies Reviewed: Additional studies/ records that were reviewed today include: ED records.  Labs. Review of the above records demonstrates:  Please see elsewhere in the note.     ASSESSMENT AND PLAN:  Chest pain: Her chest discomfort was nonanginal sounding.  She had no objective evidence of ischemia.  She had minimal coronary plaque previously.  I do not think further  cardiac workup is suggested as she is having no ongoing symptoms but she needs primary risk reduction.  Diabetes: She actually has prediabetes and an A1c of 6.0.  No change in therapy.  Hypertension: She is going to keep a blood pressure diary and send that to me on MyChart.  She says it is controlled at home but she is not particularly clear and she probably will need med titration.  LVH: I am going to repeat an echocardiogram.  She has had LVH.  I think this is likely secondary to hypertension.  Follow-up will be based on this.  She seems to be euvolemic.  Obesity: She has lost a lot of weight through diet.  We talked about GLP-1's and if she hits a plateau she could be considered for this.  And proud of her for losing her weight through diet control and I encouraged more the same.  Current medicines are reviewed at length with the patient today.  The patient does not have concerns regarding medicines.  The following changes have been made:  no change  Labs/ tests ordered today include:   Orders Placed This Encounter  Procedures   ECHOCARDIOGRAM COMPLETE     Disposition:   FU with with me as needed based on the results of the above   Signed, Lynwood Schilling, MD  03/28/2024 11:36 AM    [facility]

## 2024-03-28 ENCOUNTER — Ambulatory Visit: Attending: Cardiology | Admitting: Cardiology

## 2024-03-28 ENCOUNTER — Encounter: Payer: Self-pay | Admitting: Cardiology

## 2024-03-28 VITALS — BP 152/86 | HR 69 | Ht 60.5 in | Wt 231.4 lb

## 2024-03-28 DIAGNOSIS — I10 Essential (primary) hypertension: Secondary | ICD-10-CM

## 2024-03-28 DIAGNOSIS — I517 Cardiomegaly: Secondary | ICD-10-CM | POA: Diagnosis not present

## 2024-03-28 DIAGNOSIS — E118 Type 2 diabetes mellitus with unspecified complications: Secondary | ICD-10-CM

## 2024-03-28 DIAGNOSIS — R072 Precordial pain: Secondary | ICD-10-CM | POA: Diagnosis not present

## 2024-03-28 NOTE — Patient Instructions (Signed)
 Medication Instructions:  Your physician recommends that you continue on your current medications as directed. Please refer to the Current Medication list given to you today.  *If you need a refill on your cardiac medications before your next appointment, please call your pharmacy*  Lab Work: NONE If you have labs (blood work) drawn today and your tests are completely normal, you will receive your results only by: MyChart Message (if you have MyChart) OR A paper copy in the mail If you have any lab test that is abnormal or we need to change your treatment, we will call you to review the results.  Testing/Procedures: Echocardiogram Your physician has requested that you have an echocardiogram. Echocardiography is a painless test that uses sound waves to create images of your heart. It provides your doctor with information about the size and shape of your heart and how well your heart's chambers and valves are working. This procedure takes approximately one hour. There are no restrictions for this procedure. Please do NOT wear cologne, perfume, aftershave, or lotions (deodorant is allowed). Please arrive 15 minutes prior to your appointment time.  Please note: We ask at that you not bring children with you during ultrasound (echo/ vascular) testing. Due to room size and safety concerns, children are not allowed in the ultrasound rooms during exams. Our front office staff cannot provide observation of children in our lobby area while testing is being conducted. An adult accompanying a patient to their appointment will only be allowed in the ultrasound room at the discretion of the ultrasound technician under special circumstances. We apologize for any inconvenience.   Follow-Up: At [facility], you and your health needs are our priority.  As part of our continuing mission to provide you with exceptional heart care, our providers are all part of one team.  This team includes your primary  Cardiologist (physician) and Advanced Practice Providers or APPs (Physician Assistants and Nurse Practitioners) who all work together to provide you with the care you need, when you need it.  Your next appointment:   As needed  Provider:   [place], MD  We recommend signing up for the patient portal called MyChart.  Sign up information is provided on this After Visit Summary.  MyChart is used to connect with patients for Virtual Visits (Telemedicine).  Patients are able to view lab/test results, encounter notes, upcoming appointments, etc.  Non-urgent messages can be sent to your provider as well.   To learn more about what you can do with MyChart, go to [email or web address].

## 2024-04-07 ENCOUNTER — Other Ambulatory Visit: Payer: Self-pay | Admitting: Nurse Practitioner

## 2024-04-07 DIAGNOSIS — I10 Essential (primary) hypertension: Secondary | ICD-10-CM

## 2024-05-08 ENCOUNTER — Ambulatory Visit (HOSPITAL_COMMUNITY)
Admission: RE | Admit: 2024-05-08 | Discharge: 2024-05-08 | Disposition: A | Discharge status: Home / Self Care | Source: Ambulatory Visit | Attending: Internal Medicine | Admitting: Internal Medicine

## 2024-05-08 DIAGNOSIS — I517 Cardiomegaly: Secondary | ICD-10-CM | POA: Diagnosis present

## 2024-05-08 LAB — ECHOCARDIOGRAM COMPLETE
Area-P 1/2: 3.68 cm2
MV M vel: 5.65 m/s
MV Peak grad: 127.7 mmHg
P 1/2 time: 550 ms
Radius: 0.5 cm
S' Lateral: 2.8 cm

## 2024-05-09 ENCOUNTER — Ambulatory Visit: Payer: Self-pay | Admitting: Cardiology

## 2024-05-12 NOTE — Telephone Encounter (Signed)
 Copied from CRM #[id number]. Topic: Clinical - Lab/Test Results >> May 12, 2024 10:16 AM Aleatha BROCKS wrote: Reason for CRM: Patient would like a call from doctor to go over here cecocardiogram please give her a call   Pt was called and advised of Dr. Lindi  results. KH

## 2024-05-14 ENCOUNTER — Other Ambulatory Visit: Payer: Self-pay | Admitting: Nurse Practitioner

## 2024-05-14 DIAGNOSIS — E7849 Other hyperlipidemia: Secondary | ICD-10-CM

## 2024-05-19 ENCOUNTER — Encounter: Payer: Self-pay | Admitting: Radiology

## 2024-05-20 ENCOUNTER — Other Ambulatory Visit: Payer: Self-pay | Admitting: Nurse Practitioner

## 2024-05-20 DIAGNOSIS — E119 Type 2 diabetes mellitus without complications: Secondary | ICD-10-CM

## 2024-05-20 NOTE — Telephone Encounter (Unsigned)
 Copied from CRM [id number]. Topic: Clinical - Medication Refill >> May 20, 2024  8:05 AM Kendralyn S wrote: Medication: metFORMIN  (GLUCOPHAGE ) 500 MG tablet  Has the patient contacted their pharmacy? Yes (Agent: If no, request that the patient contact the pharmacy for the refill. If patient does not wish to contact the pharmacy document the reason why and proceed with request.) (Agent: If yes, when and what did the pharmacy advise?)  This is the patient's preferred pharmacy:  [id number] - [place], [place] - [address] AT [facility] [address] [place] [address] Phone: [phone] Fax: [phone]   Is this the correct pharmacy for this prescription? Yes If no, delete pharmacy and type the correct one.   Has the prescription been filled recently? No  Is the patient out of the medication? No, 3 days left  Has the patient been seen for an appointment in the last year OR does the patient have an upcoming appointment? Yes  Can we respond through MyChart? Yes  Agent: Please be advised that Rx refills may take up to 3 business days. We ask that you follow-up with your pharmacy.

## 2024-05-21 MED ORDER — METFORMIN HCL 500 MG PO TABS: 500.0000 mg | ORAL_TABLET | Freq: Two times a day (BID) | ORAL | 3 refills | Status: AC

## 2024-05-22 NOTE — Progress Notes (Deleted)
 Office Visit Note  Patient: Hannah Huerta             Date of Birth: Nov 15, 1960           MRN: [id number]             PCP: Oley Bascom RAMAN, NP Referring: Jegede, Olugbemiga E, MD Visit Date: 06/03/2024   Subjective:  No chief complaint on file.   History of Present Illness: Hannah Huerta is a 63 y.o. female here for follow up for bilateral carpal tunnel syndrome.   Previous HPI 03/29/2023 Hannah Huerta is a 63 y.o. female here for follow up for bilateral carpal tunnel syndrome previously treated with steroid injections in March. She had a very good response all of this took a few weeks after the injections compared to starting at the next day when we initially did these last year.  Started having symptoms again since about 2 months ago have progressively been getting worse.  With more frequent numbness in her hands provoked intermittently during the day and common overnight. She does not see significant swelling anywhere else. She has an appointment for orthopedic surgery clinic evaluation later this month.   Previous HPI 09/26/2022 Hannah Huerta is a 63 y.o. female here for follow up for bilateral carpal tunnel syndrome previously treated with steroid injections in May last year. Symptoms worsening again for about 3 months, left side worse than right. Numbness in fingertips is returned and sometimes pain shoots up the arm. She notices swelling at the wrist no visible changes in her fingers. Driving worsens symptoms she has to rest her hands at the base of the steering wheel to avoid wrist extension.    Previous HPI 02/20/22 Hannah Huerta is a 63 y.o. female here for follow up bilateral hand pains consistent with CTS and possible RA. She felt a large improvement 1 day after wrist injections last visit and continues to do better. She is able to grip steering wheel normally and does not wake at night from wrist pain any more. Her right ankle has some pain and swelling worse later in the day,  this is typical for her especially in the summer. She still has morning stiffness in multiple areas lasting only a few minutes.   Previous HPI 11/18/2021 Hannah Huerta is a 63 y.o. female here for follow up for bilateral hand pains consistent with CTS and possible RA. She continues to have similar amount of wrist pain and swelling since our last visit. No appreciable benefit with conservative treatment of stretching or bracing wrist in neutral position.   09/22/21 Hannah Huerta is a 63 y.o. female here for evaluation for possible rheumatoid arthritis. Her history includes T2DM, migraine, CAD, CHF, and chronic low back pain with DJD and right sided sciatica. She has joint pain in multiple areas treatments including acetaminophen , topical diclofenac , gabapentin , naproxen  although avoiding long term NSAIDs due to renal function.  He has had trouble due to right sided foot drop ongoing since about the past 4 years.  This is thought to be from residual damage due to significant lumbar spinal stenosis now status post surgical fusion.  Swelling and pain in this foot is worse in the past 1 year.  She is also having trouble with bilateral hands with pain and swelling also reports hand numbness that is worse at night mornings and when driving long distances.  She had previous evaluation with the symptoms in 2020 showing positive rheumatoid factor and  CCP antibodies. She was never definitively diagnosed or on any disease specific treatments for RA.   Labs reviewed 08/2018 RF 57.4 CCP 168 ANA neg   No Rheumatology ROS completed.   PMFS History:  Patient Active Problem List   Diagnosis Date Noted   Pain in joint of right hip 04/12/2023   Cervical cancer screening 12/21/2022   Type 2 diabetes mellitus without complication, without long-term current use of insulin  (HCC) 08/04/2022   Medial orbital wall fracture, closed, initial encounter (HCC) 10/03/2021   Bilateral carpal tunnel syndrome 09/22/2021    Rheumatoid factor positive 09/22/2021   Bilateral hand swelling 09/22/2021   Spondylolisthesis 05/06/2021   Uncontrolled type 2 diabetes mellitus with microalbuminuria 03/28/2021   Foot-drop 01/04/2021   Status post lumbar spinal fusion 02/12/2020   Spondylolisthesis of lumbar region 02/05/2020   Arthropathy of lumbar facet joint 12/11/2019   Chronic right-sided low back pain with right-sided sciatica 11/04/2019   Other chronic pain 11/04/2019   DDD (degenerative disc disease), lumbar 11/04/2019   Essential hypertension 11/04/2019   Body mass index (BMI) 50.0-59.9, adult (HCC) 11/04/2019   Spinal stenosis of lumbar region 11/04/2019   Spondylolisthesis, lumbar region 11/04/2019   Vitamin D  deficiency 09/10/2019   CHF (congestive heart failure) (HCC) 01/30/2018   OSA (obstructive sleep apnea) 03/01/2017   ARF (acute renal failure) 02/28/2017   Hyponatremia 02/28/2017   Syncope 07/23/2016   Syncope and collapse 07/23/2016   S/P cardiac catheterization, non obstructive disease 06/04/15 06/05/2015   CAD in native artery 06/05/2015   Fever 06/05/2015   Hypokalemia 06/05/2015   Metabolic syndrome 06/05/2015   Chest pain, neg MI, possible GI 06/04/2015   Colon cancer screening 08/05/2014   Severe obesity (BMI >= 40) (HCC) 08/05/2014   Health care maintenance 05/13/2014   Prediabetes 05/13/2014   Chronic migraine 04/27/2014   Atypical chest pain 04/27/2014   SOB (shortness of breath) 09/16/2013   Palpitations 09/16/2013   HTN (hypertension) 03/09/2012   Hyperlipidemia 03/09/2012   Microcytic anemia    Morbid obesity with BMI of 50.0-59.9, adult (HCC)    Precordial chest pain 03/06/2012   Dizziness 03/06/2012    Past Medical History:  Diagnosis Date   Abnormal Pap smear of cervix    pt couldnt state what type of abnormality   Anemia    Arthritis    Carpal tunnel syndrome, bilateral    CHF (congestive heart failure) (HCC)    Chronic migraine    Diabetes mellitus without  complication (HCC)    GERD (gastroesophageal reflux disease)    HLD (hyperlipidemia)    Hypertension    Hypokalemia 06/05/2015   Metabolic syndrome 06/05/2015   Morbid obesity with BMI of 50.0-59.9, adult (HCC)    Prediabetes    S/P cardiac catheterization, non obstructive disease 06/04/15 06/05/2015   Sickle cell trait    Spondylolisthesis of lumbar region    Syncope     Family History  Problem Relation Age of Onset   Sudden death Mother 59       Questionable MI   Diabetes Mother    Hypertension Mother    Coronary artery disease Father 16   Heart disease Father    Other Father        died in a MVA   Hypertension Father    Breast cancer Sister 85   Hypertension Sister    Colon polyps Sister    Pancreatic cancer Maternal Aunt    Breast cancer Maternal Aunt  after age 62   Stroke Paternal Grandmother    Colon cancer Neg Hx    Gallbladder disease Neg Hx    Esophageal cancer Neg Hx    Past Surgical History:  Procedure Laterality Date   BACK SURGERY     x1 lumbar fusion   CARDIAC CATHETERIZATION N/A 06/04/2015   Procedure: Left Heart Cath and Coronary Angiography;  Surgeon: Victory LELON Sharps, MD;  Location: [facility] INVASIVE CV LAB;  Service: Cardiovascular;  Laterality: N/A;   CHOLECYSTECTOMY     laparoscopic   COLONOSCOPY WITH PROPOFOL  N/A 09/04/2014   Procedure: COLONOSCOPY WITH PROPOFOL ;  Surgeon: Lupita FORBES Commander, MD;  Location: [facility] ENDOSCOPY;  Service: Endoscopy;  Laterality: N/A;   LEFT HEART CATHETERIZATION WITH CORONARY ANGIOGRAM N/A 03/08/2012   Procedure: LEFT HEART CATHETERIZATION WITH CORONARY ANGIOGRAM;  Surgeon: Debby JONETTA Como, MD;  Location: [facility] CATH LAB;  Service: Cardiovascular;  Laterality: N/A;   SPINE SURGERY     fusion   TUBAL LIGATION     Social History   Social History Narrative   Lives at home alone.  3 children.  Four grand.     Immunization History  Administered Date(s) Administered   Influenza,inj,Quad PF,6+ Mos 04/28/2014, 07/25/2016    Janssen (J&J) SARS-COV-2 Vaccination 11/03/2019   Pneumococcal Polysaccharide-23 04/28/2014, 03/01/2017   Tdap 09/15/2012     Objective: Vital Signs: LMP 09/15/2013    Physical Exam   Musculoskeletal Exam: ***  CDAI Exam: CDAI Score: -- Patient Global: --; Provider Global: -- Swollen: --; Tender: -- Joint Exam 06/03/2024   No joint exam has been documented for this visit   There is currently no information documented on the homunculus. Go to the Rheumatology activity and complete the homunculus joint exam.  Investigation: No additional findings.  Imaging: ECHOCARDIOGRAM COMPLETE Result Date: 05/08/2024    ECHOCARDIOGRAM REPORT   Patient Name:   KYMBERLEY RAZ Date of Exam: 05/08/2024 Medical Rec #:  [id number]     Height:       60.5 in Accession #:    [id number]    Weight:       231.4 lb Date of Birth:  November 02, 1960     BSA:          1.998 m Patient Age:    63 years      BP:           152/86 mmHg Patient Gender: F             HR:           65 bpm. Exam Location:  [address] Procedure: 2D Echo, 3D Echo, Cardiac Doppler and Color Doppler (Both Spectral            and Color Flow Doppler were utilized during procedure). Indications:    I51.7 Left Ventricular Hypertrophy  History:        Patient has prior history of Echocardiogram examinations, most                 recent 07/25/2016. CHF, CAD, Signs/Symptoms:Chest Pain; Risk                 Factors:Family History of Coronary Artery Disease, Hypertension,                 Diabetes and Dyslipidemia. Morbid Obesity, Migraines, Sickle                 Cell Trait, Bilateral Carpal Tunnel, Metabolic Syndrome.  Sonographer:    Heather Hawks RDCS Referring  Phys: JAMES HOCHREIN IMPRESSIONS  1. Left ventricular ejection fraction, by estimation, is 65 to 70%. The left ventricle has normal function. The left ventricle has no regional wall motion abnormalities. Left ventricular diastolic parameters are consistent with Grade I diastolic dysfunction (impaired  relaxation).  2. Right ventricular systolic function is normal. The right ventricular size is normal.  3. The mitral valve is normal in structure. Mild mitral valve regurgitation.  4. The aortic valve is tricuspid. Aortic valve regurgitation is trivial.  5. The inferior vena cava is normal in size with <50% respiratory variability, suggesting right atrial pressure of 8 mmHg. FINDINGS  Left Ventricle: Left ventricular ejection fraction, by estimation, is 65 to 70%. The left ventricle has normal function. The left ventricle has no regional wall motion abnormalities. 3D ejection fraction reviewed and evaluated as part of the interpretation. Alternate measurement of EF is felt to be most reflective of LV function. The left ventricular internal cavity size was normal in size. There is no left ventricular hypertrophy. Left ventricular diastolic parameters are consistent with Grade I diastolic dysfunction (impaired relaxation). Right Ventricle: The right ventricular size is normal. Right vetricular wall thickness was not assessed. Right ventricular systolic function is normal. Left Atrium: Left atrial size was normal in size. Right Atrium: Right atrial size was normal in size. Pericardium: There is no evidence of pericardial effusion. Mitral Valve: The mitral valve is normal in structure. Mild mitral valve regurgitation. Tricuspid Valve: The tricuspid valve is normal in structure. Tricuspid valve regurgitation is trivial. Aortic Valve: The aortic valve is tricuspid. Aortic valve regurgitation is trivial. Aortic regurgitation PHT measures 550 msec. Pulmonic Valve: The pulmonic valve was grossly normal. Pulmonic valve regurgitation is trivial. No evidence of pulmonic stenosis. Aorta: The aortic root and ascending aorta are structurally normal, with no evidence of dilitation. Venous: The inferior vena cava is normal in size with less than 50% respiratory variability, suggesting right atrial pressure of 8 mmHg. IAS/Shunts: No  atrial level shunt detected by color flow Doppler. Additional Comments: 3D was performed not requiring image post processing on an independent workstation and was normal.  LEFT VENTRICLE PLAX 2D LVIDd:         4.90 cm   Diastology LVIDs:         2.80 cm   LV e' medial:    6.25 cm/s LV PW:         0.90 cm   LV E/e' medial:  11.7 LV IVS:        0.90 cm   LV e' lateral:   9.14 cm/s LVOT diam:     2.30 cm   LV E/e' lateral: 8.0 LV SV:         86 LV SV Index:   43 LVOT Area:     4.15 cm LV IVRT:       103 msec                          3D Volume EF:                          3D EF:        76 %                          LV EDV:       145 ml  LV ESV:       35 ml                          LV SV:        109 ml RIGHT VENTRICLE             IVC RV Basal diam:  3.10 cm     IVC diam: 1.30 cm RV S prime:     10.75 cm/s TAPSE (M-mode): 2.8 cm      PULMONARY VEINS RVSP:           21.0 mmHg   Diastolic Velocity: 65.00 cm/s                             S/D Velocity:       0.80                             Systolic Velocity:  52.40 cm/s LEFT ATRIUM             Index        RIGHT ATRIUM           Index LA diam:        4.00 cm 2.00 cm/m   RA Pressure: 3.00 mmHg LA Vol (A2C):   71.2 ml 35.63 ml/m  RA Area:     18.00 cm LA Vol (A4C):   51.3 ml 25.67 ml/m  RA Volume:   52.70 ml  26.37 ml/m LA Biplane Vol: 61.7 ml 30.88 ml/m  AORTIC VALVE LVOT Vmax:   100.35 cm/s LVOT Vmean:  63.875 cm/s LVOT VTI:    0.206 m AI PHT:      550 msec  AORTA Ao Root diam: 3.00 cm Ao Asc diam:  3.40 cm MITRAL VALVE                  TRICUSPID VALVE MV Area (PHT): cm            TR Peak grad:   18.0 mmHg MV Decel Time: 206 msec       TR Vmax:        212.00 cm/s MR Peak grad:    127.7 mmHg   Estimated RAP:  3.00 mmHg MR Mean grad:    83.0 mmHg    RVSP:           21.0 mmHg MR Vmax:         565.00 cm/s MR Vmean:        434.0 cm/s   SHUNTS MR PISA:         1.57 cm     Systemic VTI:  0.21 m MR PISA Eff ROA: 9 mm        Systemic Diam: 2.30 cm MR  PISA Radius:  0.50 cm MV E velocity: 73.25 cm/s MV A velocity: 98.15 cm/s MV E/A ratio:  0.75 Vina Gull MD Electronically signed by Vina Gull MD Signature Date/Time: 05/08/2024/3:46:06 PM    Final     Recent Labs: Lab Results  Component Value Date   WBC 4.1 02/15/2024   HGB 10.8 (L) 02/15/2024   PLT 289 02/15/2024   NA 140 11/15/2023   K 4.3 11/15/2023   CL 102 11/15/2023   CO2 26 11/15/2023   GLUCOSE 95 11/15/2023   BUN 10 11/15/2023   CREATININE 0.89 11/15/2023   BILITOT 0.3 08/17/2023  ALKPHOS 89 08/17/2023   AST 19 08/17/2023   ALT 9 08/17/2023   PROT 7.1 08/17/2023   ALBUMIN  4.1 08/17/2023   CALCIUM  9.8 11/15/2023   GFRAA 77 03/10/2020    Speciality Comments: No specialty comments available.  Procedures:  No procedures performed Allergies: Patient has no known allergies.   Assessment / Plan:     Visit Diagnoses: No diagnosis found.  ***  Orders: No orders of the defined types were placed in this encounter.  No orders of the defined types were placed in this encounter.    Follow-Up Instructions: No follow-ups on file.   Jini Horiuchi M Yared Susan, CMA  Note - This record has been created using Animal nutritionist.  Chart creation errors have been sought, but may not always  have been located. Such creation errors do not reflect on  the standard of medical care.

## 2024-06-03 ENCOUNTER — Ambulatory Visit: Admitting: Internal Medicine

## 2024-06-03 DIAGNOSIS — R7689 Other specified abnormal immunological findings in serum: Secondary | ICD-10-CM

## 2024-06-03 DIAGNOSIS — G5603 Carpal tunnel syndrome, bilateral upper limbs: Secondary | ICD-10-CM

## 2024-06-30 ENCOUNTER — Encounter (HOSPITAL_COMMUNITY): Payer: Self-pay

## 2024-06-30 ENCOUNTER — Encounter (HOSPITAL_COMMUNITY): Payer: Self-pay | Admitting: Nurse Practitioner

## 2024-07-01 ENCOUNTER — Other Ambulatory Visit: Payer: Self-pay | Admitting: Nurse Practitioner

## 2024-07-01 DIAGNOSIS — I10 Essential (primary) hypertension: Secondary | ICD-10-CM

## 2024-07-01 MED ORDER — AMLODIPINE BESYLATE 5 MG PO TABS: ORAL_TABLET | ORAL | 1 refills | Status: AC

## 2024-07-01 NOTE — Telephone Encounter (Signed)
 Copied from CRM [id number]. Topic: Clinical - Medication Refill >> Jul 01, 2024 12:50 PM Zebedee SAUNDERS wrote: Medication: amLODipine  (NORVASC ) 5 MG tablet  Has the patient contacted their pharmacy? Yes (Agent: If no, request that the patient contact the pharmacy for the refill. If patient does not wish to contact the pharmacy document the reason why and proceed with request.) (Agent: If yes, when and what did the pharmacy advise?)  This is the patient's preferred pharmacy:  [id number] -  [place], [place] - [address] AT [facility] OF [address] & [address] [place] [address] Phone: [phone] Fax: [phone]  Is this the correct pharmacy for this prescription? Yes If no, delete pharmacy and type the correct one.   Has the prescription been filled recently? Yes  Is the patient out of the medication? Yes  Has the patient been seen for an appointment in the last year OR does the patient have an upcoming appointment? Yes  Can we respond through MyChart? Yes  Agent: Please be advised that Rx refills may take up to 3 business days. We ask that you follow-up with your pharmacy.

## 2024-07-05 ENCOUNTER — Other Ambulatory Visit: Payer: Self-pay | Admitting: Nurse Practitioner

## 2024-07-05 DIAGNOSIS — I10 Essential (primary) hypertension: Secondary | ICD-10-CM

## 2024-07-05 DIAGNOSIS — E119 Type 2 diabetes mellitus without complications: Secondary | ICD-10-CM

## 2024-07-30 ENCOUNTER — Emergency Department (HOSPITAL_BASED_OUTPATIENT_CLINIC_OR_DEPARTMENT_OTHER)
Admission: EM | Admit: 2024-07-30 | Discharge: 2024-07-30 | Disposition: A | Discharge status: Home / Self Care | Attending: Emergency Medicine | Admitting: Emergency Medicine

## 2024-07-30 ENCOUNTER — Other Ambulatory Visit: Payer: Self-pay

## 2024-07-30 ENCOUNTER — Emergency Department (HOSPITAL_BASED_OUTPATIENT_CLINIC_OR_DEPARTMENT_OTHER): Admitting: Radiology

## 2024-07-30 DIAGNOSIS — Z7982 Long term (current) use of aspirin: Secondary | ICD-10-CM | POA: Diagnosis not present

## 2024-07-30 DIAGNOSIS — M25552 Pain in left hip: Secondary | ICD-10-CM | POA: Insufficient documentation

## 2024-07-30 MED ORDER — OXYCODONE-ACETAMINOPHEN 5-325 MG PO TABS
1.0000 | ORAL_TABLET | Freq: Once | ORAL | Status: AC
  Administered 2024-07-30: 1 via ORAL
  Filled 2024-07-30: qty 1

## 2024-07-30 MED ORDER — OXYCODONE-ACETAMINOPHEN 5-325 MG PO TABS: 1.0000 | ORAL_TABLET | Freq: Four times a day (QID) | ORAL | 0 refills | Status: AC | PRN

## 2024-07-30 NOTE — Discharge Instructions (Addendum)
 1.  Call your orthopedic doctor's office, [facility],  today and schedule a follow-up appointment for your hip pain as soon as possible. 2.  For severe pain you may take 1 Percocet tablet every 6 hours if needed.  Try to minimize the amount of Percocet you take.  This is a narcotic pain medication and can be addictive and may cause constipation.  If you can get adequate pain relief with Exer strength Tylenol , take this every 6 hours.  Do not combine extra strength Tylenol  and Percocet.  They both contain Tylenol  (acetaminophen ).  You could end up taking too much acetaminophen  if you combine the 2 medications.  You may alternate doses of the 2 medications.  You could take extra strength Tylenol  in the morning, then 6 hours later if you are still having too much pain, take a Percocet tablet.

## 2024-07-30 NOTE — ED Notes (Signed)
 DC paperwork given a verbally understood.

## 2024-07-30 NOTE — ED Triage Notes (Signed)
 Pt states that approximately one month ago she began having L hip pain without a known cause. Pt reports that since Sunday her pain has been worsening and limiting her mobility. Pt ambulatory with a cane in the room.

## 2024-07-30 NOTE — ED Provider Notes (Signed)
 " [place] EMERGENCY DEPARTMENT AT [facility] Provider Note   CSN: [id number] Arrival date & time: 07/30/24  9251     Patient presents with: Hip Pain   Hannah Huerta is a 64 y.o. female.   HPI Patient reports left hip pain for about a month.  There has not been a specific injury.  She reports she used to have problems with right sided hip pain but that seems to have abated somewhat and now migrated to the left hip.  Patient has been wearing a foot drop brace on the right lower extremity for quite some time and she uses a cane.  Patient reports she is also had 2 prior back surgeries.  She has not had any fall or acute injury regarding the left hip.  She reports however on Sunday she was in the shower trying to get ready for church and it started giving her so much pain, she had trouble getting out of the shower.  She reports it felt like someone was just twisting her hip like it was a rag..  It is also been causing nighttime pain.  She reports that she is never really bound to sleep on the right side of her body so she sleeps on the left but now it is starting to wake her up at night with pain.  She indicates a spot right over the trochanters where she is getting severe pain.  She denies actual weakness or numbness to the leg.  She reports she does feel however that she has gotten some numbness in her toes on the left.  She reports she used to have numbness in her toes on the right but that seems to gotten better.    Prior to Admission medications  Medication Sig Start Date End Date Taking? Authorizing Provider  oxyCODONE -acetaminophen  (PERCOCET/ROXICET) 5-325 MG tablet Take 1 tablet by mouth every 6 (six) hours as needed for severe pain (pain score 7-10). 07/30/24  Yes Armenta Canning, MD  acetaminophen  (TYLENOL ) 325 MG tablet Take 2 tablets (650 mg total) by mouth every 6 (six) hours as needed. 02/10/23   Elnor Jayson LABOR, DO  amLODipine  (NORVASC ) 5 MG tablet TAKE 2 TABLETS(10 MG) BY  MOUTH DAILY 07/01/24   Nichols, Tonya S, NP  aspirin  EC 81 MG tablet Take 1 tablet (81 mg total) by mouth daily. 03/07/17   Tilford Bertram HERO, FNP  atorvastatin  (LIPITOR ) 40 MG tablet TAKE 1 TABLET(40 MG) BY MOUTH DAILY 05/15/24   Nichols, Tonya S, NP  carvedilol  (COREG ) 12.5 MG tablet TAKE 1 TABLET(12.5 MG) BY MOUTH TWICE DAILY WITH A MEAL 07/07/24   Nichols, Tonya S, NP  ibuprofen  (ADVIL ) 600 MG tablet Take 1 tablet (600 mg total) by mouth every 6 (six) hours as needed. Patient not taking: Reported on 03/28/2024 02/10/23   Elnor Jayson A, DO  Iron  Combinations (CHROMAGEN) capsule Take 1 capsule by mouth daily. 65 mg    [provider]  lidocaine  (LIDODERM ) 5 % Place 1 patch onto the skin daily as needed. Remove & Discard patch within 12 hours or as directed by MD Patient not taking: Reported on 03/28/2024 02/10/23   Elnor Jayson A, DO  metFORMIN  (GLUCOPHAGE ) 500 MG tablet TAKE 1 TABLET(500 MG) BY MOUTH TWICE DAILY WITH A MEAL 07/07/24   Oley Bascom RAMAN, NP  naproxen  (NAPROSYN ) 375 MG tablet Take 1 tablet (375 mg total) by mouth 2 (two) times daily. Patient not taking: Reported on 03/28/2024 09/01/21   Redwine, Madison A, PA-C  Replens Vaginal Moisturizer GEL Place 1 Application vaginally every 3 (three) days. Patient not taking: Reported on 03/28/2024 12/26/22   Oley Bascom RAMAN, NP  spironolactone  (ALDACTONE ) 25 MG tablet Take 1 tablet (25 mg total) by mouth daily. 10/12/23 10/11/24  Oley Bascom RAMAN, NP  triamcinolone  cream (KENALOG ) 0.1 % Apply 1 Application topically 2 (two) times daily. 02/15/24   Oley Bascom RAMAN, NP    Allergies: Patient has no known allergies.    Review of Systems  Updated Vital Signs BP 119/64   Pulse (!) 58   Temp 97.6 F (36.4 C) (Oral)   Resp 16   LMP 09/15/2013   SpO2 100%   Physical Exam Constitutional:      Comments: Alert nontoxic no acute distress.  HENT:     Mouth/Throat:     Pharynx: Oropharynx is clear.  Eyes:     Extraocular Movements:  Extraocular movements intact.  Cardiovascular:     Rate and Rhythm: Normal rate and regular rhythm.     Heart sounds: Normal heart sounds.  Pulmonary:     Effort: Pulmonary effort is normal.     Breath sounds: Normal breath sounds.  Abdominal:     General: There is no distension.     Palpations: Abdomen is soft.     Tenderness: There is no abdominal tenderness.  Musculoskeletal:     Comments: No mass or fullness in the inguinal crease.  Pain does localize to palpation right over the trochanter area.  This is where she manually indicates she has most pain.  There are no soft tissue abnormalities here.  She does not have significant reproducible pain in the SI joint or the midline back.  Patient can go from supine in the stretcher to sitting up to facilitate examination.  This does not elicit significant pain response.  Patient has a foot drop brace on the right lower extremity.  Left lower extremity does not have any peripheral edema or calf tenderness.  Neither extremity has edema.  Left foot is warm and dry without any wounds.  Dorsalis pedis pulse 2+ and strong.  No erythema around the ankle or lower leg.  Patient soft tissues of the leg knee and thigh are normal.  Patient does have significant obesity but no appearance of deformity or effusion at joints.  Neurological:     General: No focal deficit present.     Mental Status: She is oriented to person, place, and time.  Psychiatric:        Mood and Affect: Mood normal.     (all labs ordered are listed, but only abnormal results are displayed) Labs Reviewed - No data to display  EKG: None  Radiology: DG Hip Unilat With Pelvis 2-3 Views Left Result Date: 07/30/2024 CLINICAL DATA:  One month history left hip pain.  No injury. EXAM: DG HIP (WITH OR WITHOUT PELVIS) 2-3V LEFT COMPARISON:  12/20/2022 FINDINGS: Minimal symmetric osteoarthritic change of the hips. No acute fracture or dislocation. Partially visualized spinal fusion hardware  over the lumbar spine intact. IMPRESSION: 1. No acute findings. 2. Minimal symmetric osteoarthritic change of the hips. Electronically Signed   By: Toribio Agreste M.D.   On: 07/30/2024 09:46     Procedures   Medications Ordered in the ED  oxyCODONE -acetaminophen  (PERCOCET/ROXICET) 5-325 MG per tablet 1 tablet (1 tablet Oral Given 07/30/24 0841)  Medical Decision Making Amount and/or Complexity of Data Reviewed Radiology: ordered.  Risk Prescription drug management.   Patient's had a month of left-sided hip pain.  Pain by exam and history seems to be very much localizing to the joint.  I have lower suspicion for sciatica in this case.  Patient does report history of back problems but is not getting radiation or typical back pain.  Will proceed with hip x-ray.  I have personally examined hip x-ray.  No acute finding or fracture.  Reviewed by radiology without any acute significant finding.  Pain has been fairly longstanding now.  Pain seems to migrate from the right hip to the left hip.  Patient has body mechanics issues that I suspect are contributing to hip pain.  She has to accommodate for foot drop on the right and alternately uses a cane to assist in ambulation.  She also has a history of low back surgery.  Patient has significant physical deconditioning and likely this is multifactorial etiology of hip pain.  At this time for acute and severe pain will prescribe Percocet to use on sparing as needed basis with advice for expeditious follow-up with Ortho care where she has been seen previously.     Final diagnoses:  Left hip pain    ED Discharge Orders          Ordered    oxyCODONE -acetaminophen  (PERCOCET/ROXICET) 5-325 MG tablet  Every 6 hours PRN        07/30/24 0954               Armenta Canning, MD 07/30/24 [phone]  "

## 2024-08-01 ENCOUNTER — Telehealth: Payer: Self-pay

## 2024-08-01 NOTE — Transitions of Care (Post Inpatient/ED Visit) (Signed)
" ° °  08/01/2024  Name: TARRIE MCMICHEN MRN: [id number] DOB: May 25, 1961  Today's TOC FU Call Status:    Attempted to reach the patient regarding the most recent Inpatient/ED visit.  Follow Up Plan: Additional outreach attempts will be made to reach the patient to complete the Transitions of Care (Post Inpatient/ED visit) call.   Signature Geroge Hahn, CMA   "

## 2024-08-04 ENCOUNTER — Telehealth: Payer: Self-pay

## 2024-08-04 NOTE — Transitions of Care (Post Inpatient/ED Visit) (Cosign Needed)
 "  08/04/2024  Name: Hannah Huerta MRN: [id number] DOB: 1961-01-05  Today's TOC FU Call Status: Today's TOC FU Call Status:: Successful TOC FU Call Completed TOC FU Call Complete Date: 08/04/24  Patient's Name and Date of Birth confirmed. Name, DOB  Transition Care Management Follow-up Telephone Call Date of Discharge: 07/30/24 Discharge Facility: [facility] ([facility]) Type of Discharge: Emergency Department Reason for ED Visit: Orthopedic Conditions Orthopedic/Injury Diagnosis: Sprain or Strain (hip pain) How have you been since you were released from the hospital?: Better Any questions or concerns?: No  Items Reviewed: Did you receive and understand the discharge instructions provided?: Yes Medications obtained,verified, and reconciled?: Yes (Medications Reviewed) Any new allergies since your discharge?: No Dietary orders reviewed?: NA Do you have support at home?: Yes People in Home [RPT]: child(ren), adult  Medications Reviewed Today: Medications Reviewed Today     Reviewed by Victory Iha, RMA (Registered Medical Assistant) on 08/04/24 at 1227  Med List Status: <None>   Medication Order Taking? Sig Documenting Provider Last Dose Status Informant  acetaminophen  (TYLENOL ) 325 MG tablet [id number] Yes Take 2 tablets (650 mg total) by mouth every 6 (six) hours as needed. Elnor Jayson LABOR, DO  Active   amLODipine  (NORVASC ) 5 MG tablet [id number] Yes TAKE 2 TABLETS(10 MG) BY MOUTH DAILY Nichols, Tonya S, NP  Active   aspirin  EC 81 MG tablet [id number] Yes Take 1 tablet (81 mg total) by mouth daily. Tilford Bertram HERO, FNP  Active Self           Med Note TOBY REENA VEAR [place] Sep 22, 2021  8:05 AM)    atorvastatin  (LIPITOR ) 40 MG tablet [id number] Yes TAKE 1 TABLET(40 MG) BY MOUTH DAILY Nichols, Tonya S, NP  Active   carvedilol  (COREG ) 12.5 MG tablet [id number] Yes TAKE 1 TABLET(12.5 MG) BY MOUTH TWICE DAILY WITH A MEAL Nichols, Tonya S, NP  Active   ibuprofen  (ADVIL ) 600 MG  tablet [id number]  Take 1 tablet (600 mg total) by mouth every 6 (six) hours as needed.  Patient not taking: Reported on 08/04/2024   Elnor Jayson A, DO  Active   Iron  Combinations (CHROMAGEN) capsule [id number] Yes Take 1 capsule by mouth daily. 65 mg [provider]  Active   lidocaine  (LIDODERM ) 5 % [id number]  Place 1 patch onto the skin daily as needed. Remove & Discard patch within 12 hours or as directed by MD  Patient not taking: Reported on 08/04/2024   Elnor Jayson A, DO  Active   metFORMIN  (GLUCOPHAGE ) 500 MG tablet [id number] Yes TAKE 1 TABLET(500 MG) BY MOUTH TWICE DAILY WITH A MEAL Nichols, Tonya S, NP  Active   naproxen  (NAPROSYN ) 375 MG tablet [id number]  Take 1 tablet (375 mg total) by mouth 2 (two) times daily.  Patient not taking: Reported on 08/04/2024   Redwine, Madison A, PA-C  Active   oxyCODONE -acetaminophen  (PERCOCET/ROXICET) 5-325 MG tablet [id number] Yes Take 1 tablet by mouth every 6 (six) hours as needed for severe pain (pain score 7-10). Armenta Canning, MD  Active   Replens Vaginal Moisturizer GEL [id number]  Place 1 Application vaginally every 3 (three) days.  Patient not taking: Reported on 08/04/2024   Oley Bascom RAMAN, NP  Active   spironolactone  (ALDACTONE ) 25 MG tablet [id number] Yes Take 1 tablet (25 mg total) by mouth daily. Oley Bascom RAMAN, NP  Active   triamcinolone  cream (KENALOG ) 0.1 % [id number]  Apply 1 Application topically 2 (two) times daily.  Patient not taking: Reported  on 08/04/2024   Oley Bascom RAMAN, NP  Active             Home Care and Equipment/Supplies: Were Home Health Services Ordered?: NA Any new equipment or medical supplies ordered?: NA  Functional Questionnaire: Do you need assistance with bathing/showering or dressing?: No Do you need assistance with meal preparation?: No Do you need assistance with eating?: No Do you have difficulty maintaining continence: No Do you need assistance with getting out of bed/getting out of a  chair/moving?: No Do you have difficulty managing or taking your medications?: No  Follow up appointments reviewed: PCP Follow-up appointment confirmed?: Yes Date of PCP follow-up appointment?: 08/08/24 Follow-up Provider: Bascom Oley Specialist [facility] Follow-up appointment confirmed?: NA Do you need transportation to your follow-up appointment?: No Do you understand care options if your condition(s) worsen?: Yes-patient verbalized understanding  SDOH Interventions Today    Flowsheet Row Most Recent Value  SDOH Interventions   Food Insecurity Interventions Intervention Not Indicated  Housing Interventions Intervention Not Indicated  Transportation Interventions Intervention Not Indicated  Utilities Interventions Intervention Not Indicated    SIGNATURE  Suzen Shove   CMA II  "

## 2024-08-08 ENCOUNTER — Inpatient Hospital Stay: Payer: Self-pay | Admitting: Nurse Practitioner

## 2024-08-08 NOTE — Assessment & Plan Note (Signed)
 Hannah Huerta

## 2024-08-08 NOTE — Progress Notes (Unsigned)
 "  Office Visit Note  Patient: Hannah Huerta             Date of Birth: 21-Oct-1960           MRN: [id number]             PCP: Oley Bascom RAMAN, NP Referring: Jegede, Olugbemiga E, MD Visit Date: 08/19/2024   Subjective:  No chief complaint on file.   History of Present Illness: Hannah Huerta is a 64 y.o. female here for follow up for bilateral carpal tunnel syndrome previously treated with steroid injections in March.   Previous HPI 03/29/2023 Hannah CRISPEN is a 64 y.o. female here for follow up for bilateral carpal tunnel syndrome previously treated with steroid injections in March. She had a very good response all of this took a few weeks after the injections compared to starting at the next day when we initially did these last year.  Started having symptoms again since about 2 months ago have progressively been getting worse.  With more frequent numbness in her hands provoked intermittently during the day and common overnight. She does not see significant swelling anywhere else. She has an appointment for orthopedic surgery clinic evaluation later this month.   Previous HPI 09/26/2022 Hannah Huerta is a 64 y.o. female here for follow up for bilateral carpal tunnel syndrome previously treated with steroid injections in May last year. Symptoms worsening again for about 3 months, left side worse than right. Numbness in fingertips is returned and sometimes pain shoots up the arm. She notices swelling at the wrist no visible changes in her fingers. Driving worsens symptoms she has to rest her hands at the base of the steering wheel to avoid wrist extension.    Previous HPI 02/20/22 Hannah Huerta is a 64 y.o. female here for follow up bilateral hand pains consistent with CTS and possible RA. She felt a large improvement 1 day after wrist injections last visit and continues to do better. She is able to grip steering wheel normally and does not wake at night from wrist pain any more. Her right ankle  has some pain and swelling worse later in the day, this is typical for her especially in the summer. She still has morning stiffness in multiple areas lasting only a few minutes.   Previous HPI 11/18/2021 KARLIE Huerta is a 64 y.o. female here for follow up for bilateral hand pains consistent with CTS and possible RA. She continues to have similar amount of wrist pain and swelling since our last visit. No appreciable benefit with conservative treatment of stretching or bracing wrist in neutral position.   09/22/21 EARLYN Huerta is a 64 y.o. female here for evaluation for possible rheumatoid arthritis. Her history includes T2DM, migraine, CAD, CHF, and chronic low back pain with DJD and right sided sciatica. She has joint pain in multiple areas treatments including acetaminophen , topical diclofenac , gabapentin , naproxen  although avoiding long term NSAIDs due to renal function.  He has had trouble due to right sided foot drop ongoing since about the past 4 years.  This is thought to be from residual damage due to significant lumbar spinal stenosis now status post surgical fusion.  Swelling and pain in this foot is worse in the past 1 year.  She is also having trouble with bilateral hands with pain and swelling also reports hand numbness that is worse at night mornings and when driving long distances.  She had previous evaluation with the  symptoms in 2020 showing positive rheumatoid factor and CCP antibodies. She was never definitively diagnosed or on any disease specific treatments for RA.   Labs reviewed 08/2018 RF 57.4 CCP 168 ANA neg   No Rheumatology ROS completed.   PMFS History:  Patient Active Problem List   Diagnosis Date Noted   Pain in joint of right hip 04/12/2023   Cervical cancer screening 12/21/2022   Type 2 diabetes mellitus without complication, without long-term current use of insulin  (HCC) 08/04/2022   Medial orbital wall fracture, closed, initial encounter (HCC) 10/03/2021    Bilateral carpal tunnel syndrome 09/22/2021   Rheumatoid factor positive 09/22/2021   Bilateral hand swelling 09/22/2021   Spondylolisthesis 05/06/2021   Uncontrolled type 2 diabetes mellitus with microalbuminuria 03/28/2021   Foot-drop 01/04/2021   Status post lumbar spinal fusion 02/12/2020   Spondylolisthesis of lumbar region 02/05/2020   Arthropathy of lumbar facet joint 12/11/2019   Chronic right-sided low back pain with right-sided sciatica 11/04/2019   Other chronic pain 11/04/2019   DDD (degenerative disc disease), lumbar 11/04/2019   Essential hypertension 11/04/2019   Body mass index (BMI) 50.0-59.9, adult (HCC) 11/04/2019   Spinal stenosis of lumbar region 11/04/2019   Spondylolisthesis, lumbar region 11/04/2019   Vitamin D  deficiency 09/10/2019   CHF (congestive heart failure) (HCC) 01/30/2018   OSA (obstructive sleep apnea) 03/01/2017   ARF (acute renal failure) 02/28/2017   Hyponatremia 02/28/2017   Syncope 07/23/2016   Syncope and collapse 07/23/2016   S/P cardiac catheterization, non obstructive disease 06/04/15 06/05/2015   CAD in native artery 06/05/2015   Fever 06/05/2015   Hypokalemia 06/05/2015   Metabolic syndrome 06/05/2015   Chest pain, neg MI, possible GI 06/04/2015   Colon cancer screening 08/05/2014   Severe obesity (BMI >= 40) (HCC) 08/05/2014   Health care maintenance 05/13/2014   Prediabetes 05/13/2014   Chronic migraine 04/27/2014   Atypical chest pain 04/27/2014   SOB (shortness of breath) 09/16/2013   Palpitations 09/16/2013   HTN (hypertension) 03/09/2012   Hyperlipidemia 03/09/2012   Microcytic anemia    Morbid obesity with BMI of 50.0-59.9, adult (HCC)    Precordial chest pain 03/06/2012   Dizziness 03/06/2012    Past Medical History:  Diagnosis Date   Abnormal Pap smear of cervix    pt couldnt state what type of abnormality   Anemia    Arthritis    Carpal tunnel syndrome, bilateral    CHF (congestive heart failure) (HCC)     Chronic migraine    Diabetes mellitus without complication (HCC)    GERD (gastroesophageal reflux disease)    HLD (hyperlipidemia)    Hypertension    Hypokalemia 06/05/2015   Metabolic syndrome 06/05/2015   Morbid obesity with BMI of 50.0-59.9, adult (HCC)    Prediabetes    S/P cardiac catheterization, non obstructive disease 06/04/15 06/05/2015   Sickle cell trait    Spondylolisthesis of lumbar region    Syncope     Family History  Problem Relation Age of Onset   Sudden death Mother 63       Questionable MI   Diabetes Mother    Hypertension Mother    Coronary artery disease Father 40   Heart disease Father    Other Father        died in a MVA   Hypertension Father    Breast cancer Sister 9   Hypertension Sister    Colon polyps Sister    Pancreatic cancer Maternal Aunt    Breast cancer  Maternal Aunt        after age 82   Stroke Paternal Grandmother    Colon cancer Neg Hx    Gallbladder disease Neg Hx    Esophageal cancer Neg Hx    Past Surgical History:  Procedure Laterality Date   BACK SURGERY     x1 lumbar fusion   CARDIAC CATHETERIZATION N/A 06/04/2015   Procedure: Left Heart Cath and Coronary Angiography;  Surgeon: Victory LELON Sharps, MD;  Location: [facility] INVASIVE CV LAB;  Service: Cardiovascular;  Laterality: N/A;   CHOLECYSTECTOMY     laparoscopic   COLONOSCOPY WITH PROPOFOL  N/A 09/04/2014   Procedure: COLONOSCOPY WITH PROPOFOL ;  Surgeon: Lupita FORBES Commander, MD;  Location: [facility] ENDOSCOPY;  Service: Endoscopy;  Laterality: N/A;   LEFT HEART CATHETERIZATION WITH CORONARY ANGIOGRAM N/A 03/08/2012   Procedure: LEFT HEART CATHETERIZATION WITH CORONARY ANGIOGRAM;  Surgeon: Debby JONETTA Como, MD;  Location[facility] C[facility] LAB;  Service: Cardiovascular;  Laterality: N/A;   SPINE SURGERY     fusion   TUBAL LIGATION     Social History   Social History Narrative   Lives at home alone.  3 children.  Four grand.     Immunization History  Administered Date(s) Administered    Influenza,inj,Quad PF,6+ Mos 04/28/2014, 07/25/2016   Influenza-Unspecified 05/05/2024   Janssen (J&J) SARS-COV-2 Vaccination 11/03/2019   Pneumococcal Polysaccharide-23 04/28/2014, 03/01/2017   Tdap 09/15/2012     Objective: Vital Signs: LMP 09/15/2013    Physical Exam   Musculoskeletal Exam: ***   Investigation: No additional findings.  Imaging: DG Hip Unilat With Pelvis 2-3 Views Left Result Date: 07/30/2024 CLINICAL DATA:  One month history left hip pain.  No injury. EXAM: DG HIP (WITH OR WITHOUT PELVIS) 2-3V LEFT COMPARISON:  12/20/2022 FINDINGS: Minimal symmetric osteoarthritic change of the hips. No acute fracture or dislocation. Partially visualized spinal fusion hardware over the lumbar spine intact. IMPRESSION: 1. No acute findings. 2. Minimal symmetric osteoarthritic change of the hips. Electronically Signed   By: Toribio Agreste M.D.   On: 07/30/2024 09:46    Recent Labs: Lab Results  Component Value Date   WBC 4.1 02/15/2024   HGB 10.8 (L) 02/15/2024   PLT 289 02/15/2024   NA 140 11/15/2023   K 4.3 11/15/2023   CL 102 11/15/2023   CO2 26 11/15/2023   GLUCOSE 95 11/15/2023   BUN 10 11/15/2023   CREATININE 0.89 11/15/2023   BILITOT 0.3 08/17/2023   ALKPHOS 89 08/17/2023   AST 19 08/17/2023   ALT 9 08/17/2023   PROT 7.1 08/17/2023   ALBUMIN  4.1 08/17/2023   CALCIUM  9.8 11/15/2023   GFRAA 77 03/10/2020    Speciality Comments: No specialty comments available.  Procedures:  No procedures performed Allergies: Patient has no known allergies.   Assessment / Plan:     Visit Diagnoses:  Assessment & Plan Bilateral carpal tunnel syndrome     Rheumatoid factor positive      ***  Follow-Up Instructions: No follow-ups on file.   Zackariah Vanderpol M Layton Tappan, CMA  Note - This record has been created using Animal nutritionist.  Chart creation errors have been sought, but may not always  have been located. Such creation errors do not reflect on  the standard of  medical care. "

## 2024-08-15 ENCOUNTER — Inpatient Hospital Stay: Admitting: Nurse Practitioner

## 2024-08-18 ENCOUNTER — Ambulatory Visit: Payer: Self-pay | Admitting: Nurse Practitioner

## 2024-08-19 ENCOUNTER — Ambulatory Visit: Admitting: Internal Medicine

## 2024-08-19 DIAGNOSIS — G5603 Carpal tunnel syndrome, bilateral upper limbs: Secondary | ICD-10-CM

## 2024-08-19 DIAGNOSIS — R7689 Other specified abnormal immunological findings in serum: Secondary | ICD-10-CM

## 2024-08-25 ENCOUNTER — Ambulatory Visit: Payer: Self-pay | Admitting: Nurse Practitioner
